# Patient Record
Sex: Male | Born: 1935 | Race: White | Hispanic: No | Marital: Married | Smoking: Former smoker
Health system: Southern US, Community
[De-identification: ages and names within clinical notes are randomized; demographics above are authoritative.]

## PROBLEM LIST (undated history)

## (undated) DIAGNOSIS — D649 Anemia, unspecified: Secondary | ICD-10-CM

## (undated) DIAGNOSIS — H9190 Unspecified hearing loss, unspecified ear: Secondary | ICD-10-CM

## (undated) DIAGNOSIS — Z8719 Personal history of other diseases of the digestive system: Secondary | ICD-10-CM

## (undated) DIAGNOSIS — G629 Polyneuropathy, unspecified: Secondary | ICD-10-CM

## (undated) DIAGNOSIS — K219 Gastro-esophageal reflux disease without esophagitis: Secondary | ICD-10-CM

## (undated) DIAGNOSIS — Z9289 Personal history of other medical treatment: Secondary | ICD-10-CM

## (undated) DIAGNOSIS — Z9981 Dependence on supplemental oxygen: Secondary | ICD-10-CM

## (undated) DIAGNOSIS — N3289 Other specified disorders of bladder: Secondary | ICD-10-CM

## (undated) DIAGNOSIS — I10 Essential (primary) hypertension: Secondary | ICD-10-CM

## (undated) DIAGNOSIS — R413 Other amnesia: Secondary | ICD-10-CM

## (undated) DIAGNOSIS — G2581 Restless legs syndrome: Secondary | ICD-10-CM

## (undated) DIAGNOSIS — C679 Malignant neoplasm of bladder, unspecified: Secondary | ICD-10-CM

## (undated) DIAGNOSIS — I499 Cardiac arrhythmia, unspecified: Secondary | ICD-10-CM

## (undated) DIAGNOSIS — N529 Male erectile dysfunction, unspecified: Secondary | ICD-10-CM

## (undated) DIAGNOSIS — A630 Anogenital (venereal) warts: Secondary | ICD-10-CM

## (undated) DIAGNOSIS — M1990 Unspecified osteoarthritis, unspecified site: Secondary | ICD-10-CM

## (undated) DIAGNOSIS — K921 Melena: Secondary | ICD-10-CM

## (undated) DIAGNOSIS — J189 Pneumonia, unspecified organism: Secondary | ICD-10-CM

## (undated) DIAGNOSIS — F0390 Unspecified dementia without behavioral disturbance: Secondary | ICD-10-CM

## (undated) DIAGNOSIS — I509 Heart failure, unspecified: Secondary | ICD-10-CM

## (undated) DIAGNOSIS — E119 Type 2 diabetes mellitus without complications: Secondary | ICD-10-CM

## (undated) DIAGNOSIS — I4891 Unspecified atrial fibrillation: Secondary | ICD-10-CM

## (undated) HISTORY — DX: Anogenital (venereal) warts: A63.0

## (undated) HISTORY — DX: Melena: K92.1

## (undated) HISTORY — DX: Essential (primary) hypertension: I10

## (undated) HISTORY — DX: Polyneuropathy, unspecified: G62.9

## (undated) HISTORY — DX: Other specified disorders of bladder: N32.89

## (undated) HISTORY — DX: Cardiac arrhythmia, unspecified: I49.9

## (undated) HISTORY — PX: JOINT REPLACEMENT: SHX530

## (undated) HISTORY — DX: Unspecified hearing loss, unspecified ear: H91.90

## (undated) HISTORY — DX: Unspecified osteoarthritis, unspecified site: M19.90

## (undated) HISTORY — DX: Male erectile dysfunction, unspecified: N52.9

## (undated) HISTORY — PX: KNEE ARTHROSCOPY: SHX127

## (undated) HISTORY — DX: Anemia, unspecified: D64.9

## (undated) HISTORY — DX: Gastro-esophageal reflux disease without esophagitis: K21.9

## (undated) HISTORY — DX: Other amnesia: R41.3

## (undated) HISTORY — DX: Restless legs syndrome: G25.81

---
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
## 1997-09-02 ENCOUNTER — Other Ambulatory Visit: Admission: RE | Admit: 1997-09-02 | Discharge: 1997-09-02 | Payer: Self-pay | Admitting: Cardiology

## 1999-07-31 ENCOUNTER — Ambulatory Visit (HOSPITAL_COMMUNITY): Admission: RE | Admit: 1999-07-31 | Discharge: 1999-07-31 | Payer: Self-pay | Admitting: Gastroenterology

## 1999-07-31 ENCOUNTER — Encounter (INDEPENDENT_AMBULATORY_CARE_PROVIDER_SITE_OTHER): Payer: Self-pay | Admitting: Specialist

## 2001-04-17 ENCOUNTER — Inpatient Hospital Stay (HOSPITAL_COMMUNITY): Admission: EM | Admit: 2001-04-17 | Discharge: 2001-04-22 | Payer: Self-pay | Admitting: Emergency Medicine

## 2001-04-17 ENCOUNTER — Encounter: Payer: Self-pay | Admitting: Emergency Medicine

## 2001-08-06 ENCOUNTER — Encounter: Payer: Self-pay | Admitting: Cardiology

## 2001-08-06 ENCOUNTER — Inpatient Hospital Stay (HOSPITAL_COMMUNITY): Admission: EM | Admit: 2001-08-06 | Discharge: 2001-08-08 | Payer: Self-pay | Admitting: Cardiology

## 2003-03-28 HISTORY — PX: CARDIOVASCULAR STRESS TEST: SHX262

## 2003-04-02 ENCOUNTER — Encounter (INDEPENDENT_AMBULATORY_CARE_PROVIDER_SITE_OTHER): Payer: Self-pay | Admitting: Specialist

## 2003-04-02 ENCOUNTER — Observation Stay (HOSPITAL_COMMUNITY): Admission: RE | Admit: 2003-04-02 | Discharge: 2003-04-03 | Payer: Self-pay | Admitting: Urology

## 2003-04-03 ENCOUNTER — Encounter (INDEPENDENT_AMBULATORY_CARE_PROVIDER_SITE_OTHER): Payer: Self-pay | Admitting: Specialist

## 2003-04-22 ENCOUNTER — Encounter (HOSPITAL_BASED_OUTPATIENT_CLINIC_OR_DEPARTMENT_OTHER): Admission: RE | Admit: 2003-04-22 | Discharge: 2003-05-03 | Payer: Self-pay | Admitting: Internal Medicine

## 2003-06-05 ENCOUNTER — Encounter: Admission: RE | Admit: 2003-06-05 | Discharge: 2003-06-05 | Payer: Self-pay | Admitting: Sports Medicine

## 2003-07-24 ENCOUNTER — Encounter (HOSPITAL_BASED_OUTPATIENT_CLINIC_OR_DEPARTMENT_OTHER): Admission: RE | Admit: 2003-07-24 | Discharge: 2003-08-09 | Payer: Self-pay | Admitting: Internal Medicine

## 2003-07-26 ENCOUNTER — Encounter (INDEPENDENT_AMBULATORY_CARE_PROVIDER_SITE_OTHER): Payer: Self-pay | Admitting: Specialist

## 2003-07-26 ENCOUNTER — Ambulatory Visit (HOSPITAL_COMMUNITY): Admission: RE | Admit: 2003-07-26 | Discharge: 2003-07-26 | Payer: Self-pay | Admitting: General Surgery

## 2003-11-06 ENCOUNTER — Encounter (HOSPITAL_BASED_OUTPATIENT_CLINIC_OR_DEPARTMENT_OTHER): Admission: RE | Admit: 2003-11-06 | Discharge: 2003-11-22 | Payer: Self-pay | Admitting: Internal Medicine

## 2004-02-18 ENCOUNTER — Encounter (HOSPITAL_BASED_OUTPATIENT_CLINIC_OR_DEPARTMENT_OTHER): Admission: RE | Admit: 2004-02-18 | Discharge: 2004-03-10 | Payer: Self-pay | Admitting: Internal Medicine

## 2005-08-19 ENCOUNTER — Inpatient Hospital Stay (HOSPITAL_COMMUNITY): Admission: AD | Admit: 2005-08-19 | Discharge: 2005-08-21 | Payer: Self-pay | Admitting: Cardiology

## 2009-02-27 ENCOUNTER — Inpatient Hospital Stay (HOSPITAL_COMMUNITY): Admission: AD | Admit: 2009-02-27 | Discharge: 2009-03-04 | Payer: Self-pay | Admitting: Cardiology

## 2009-07-22 ENCOUNTER — Emergency Department (HOSPITAL_COMMUNITY): Admission: EM | Admit: 2009-07-22 | Discharge: 2009-07-22 | Payer: Self-pay | Admitting: Emergency Medicine

## 2009-08-05 ENCOUNTER — Ambulatory Visit (HOSPITAL_COMMUNITY): Admission: RE | Admit: 2009-08-05 | Discharge: 2009-08-05 | Payer: Self-pay | Admitting: Neurological Surgery

## 2009-08-27 ENCOUNTER — Ambulatory Visit (HOSPITAL_COMMUNITY): Admission: RE | Admit: 2009-08-27 | Discharge: 2009-08-27 | Payer: Self-pay | Admitting: Neurological Surgery

## 2009-10-22 ENCOUNTER — Ambulatory Visit: Payer: Self-pay | Admitting: Cardiology

## 2009-10-27 ENCOUNTER — Encounter: Admission: RE | Admit: 2009-10-27 | Discharge: 2009-10-27 | Payer: Self-pay | Admitting: Gastroenterology

## 2009-12-01 ENCOUNTER — Ambulatory Visit: Payer: Self-pay | Admitting: Cardiology

## 2010-01-13 ENCOUNTER — Ambulatory Visit: Payer: Self-pay | Admitting: Cardiology

## 2010-02-16 ENCOUNTER — Ambulatory Visit: Payer: Self-pay | Admitting: Cardiology

## 2010-03-02 ENCOUNTER — Ambulatory Visit: Payer: Self-pay | Admitting: Cardiology

## 2010-03-12 ENCOUNTER — Ambulatory Visit: Payer: Self-pay | Admitting: Cardiology

## 2010-06-02 ENCOUNTER — Ambulatory Visit (INDEPENDENT_AMBULATORY_CARE_PROVIDER_SITE_OTHER): Payer: Medicare Other | Admitting: Cardiology

## 2010-06-02 DIAGNOSIS — I4891 Unspecified atrial fibrillation: Secondary | ICD-10-CM

## 2010-06-02 DIAGNOSIS — D509 Iron deficiency anemia, unspecified: Secondary | ICD-10-CM

## 2010-06-09 LAB — PROTIME-INR
INR: 1.31 (ref 0.00–1.49)
Prothrombin Time: 16.2 seconds — ABNORMAL HIGH (ref 11.6–15.2)

## 2010-06-09 LAB — DIFFERENTIAL
Eosinophils Absolute: 0.1 10*3/uL (ref 0.0–0.7)
Eosinophils Relative: 2 % (ref 0–5)
Lymphocytes Relative: 21 % (ref 12–46)
Monocytes Absolute: 0.5 10*3/uL (ref 0.1–1.0)
Monocytes Relative: 12 % (ref 3–12)

## 2010-06-09 LAB — BASIC METABOLIC PANEL
Chloride: 103 mEq/L (ref 96–112)
Glucose, Bld: 109 mg/dL — ABNORMAL HIGH (ref 70–99)
Potassium: 4.2 mEq/L (ref 3.5–5.1)

## 2010-06-09 LAB — APTT: aPTT: 35 seconds (ref 24–37)

## 2010-06-09 LAB — CBC
Hemoglobin: 9.4 g/dL — ABNORMAL LOW (ref 13.0–17.0)
Platelets: 211 10*3/uL (ref 150–400)

## 2010-06-11 ENCOUNTER — Other Ambulatory Visit: Payer: Self-pay | Admitting: *Deleted

## 2010-06-11 DIAGNOSIS — G629 Polyneuropathy, unspecified: Secondary | ICD-10-CM

## 2010-06-11 MED ORDER — GABAPENTIN 300 MG PO CAPS: 300.0000 mg | ORAL_CAPSULE | Freq: Every day | ORAL | Status: DC

## 2010-06-11 NOTE — Telephone Encounter (Signed)
Refilled meds per fax request.  

## 2010-06-23 LAB — COMPREHENSIVE METABOLIC PANEL
ALT: 16 U/L (ref 0–53)
Albumin: 3.8 g/dL (ref 3.5–5.2)
Alkaline Phosphatase: 96 U/L (ref 39–117)
BUN: 10 mg/dL (ref 6–23)
CO2: 25 mEq/L (ref 19–32)
Chloride: 102 mEq/L (ref 96–112)
Creatinine, Ser: 1.04 mg/dL (ref 0.4–1.5)
GFR calc Af Amer: >60 mL/min (ref >60)
GFR calc non Af Amer: >60 mL/min (ref >60)
Glucose, Bld: 93 mg/dL (ref 70–99)
Potassium: 4 mEq/L (ref 3.5–5.1)
Total Bilirubin: 0.6 mg/dL (ref 0.3–1.2)
Total Protein: 6.3 g/dL (ref 6.0–8.3)

## 2010-06-23 LAB — BASIC METABOLIC PANEL
BUN: 4 mg/dL — ABNORMAL LOW (ref 6–23)
BUN: 5 mg/dL — ABNORMAL LOW (ref 6–23)
BUN: 7 mg/dL (ref 6–23)
CO2: 26 mEq/L (ref 19–32)
CO2: 28 mEq/L (ref 19–32)
CO2: 29 mEq/L (ref 19–32)
Calcium: 8.2 mg/dL — ABNORMAL LOW (ref 8.4–10.5)
Calcium: 8.3 mg/dL — ABNORMAL LOW (ref 8.4–10.5)
Calcium: 8.7 mg/dL (ref 8.4–10.5)
Chloride: 102 mEq/L (ref 96–112)
Chloride: 102 mEq/L (ref 96–112)
Chloride: 103 mEq/L (ref 96–112)
Creatinine, Ser: 0.94 mg/dL (ref 0.4–1.5)
Creatinine, Ser: 0.95 mg/dL (ref 0.4–1.5)
Creatinine, Ser: 1.07 mg/dL (ref 0.4–1.5)
GFR calc Af Amer: >60 mL/min (ref >60)
GFR calc Af Amer: >60 mL/min (ref >60)
GFR calc Af Amer: >60 mL/min (ref >60)
GFR calc non Af Amer: >60 mL/min (ref >60)
GFR calc non Af Amer: >60 mL/min (ref >60)
Glucose, Bld: 96 mg/dL (ref 70–99)
Potassium: 3.3 mEq/L — ABNORMAL LOW (ref 3.5–5.1)
Potassium: 3.8 mEq/L (ref 3.5–5.1)
Sodium: 137 mEq/L (ref 135–145)
Sodium: 140 mEq/L (ref 135–145)

## 2010-06-23 LAB — CBC
HCT: 20.9 % — ABNORMAL LOW (ref 39.0–52.0)
HCT: 28.5 % — ABNORMAL LOW (ref 39.0–52.0)
HCT: 28.8 % — ABNORMAL LOW (ref 39.0–52.0)
HCT: 29 % — ABNORMAL LOW (ref 39.0–52.0)
Hemoglobin: 5.4 g/dL — CL (ref 13.0–17.0)
Hemoglobin: 6.5 g/dL — CL (ref 13.0–17.0)
Hemoglobin: 9.1 g/dL — ABNORMAL LOW (ref 13.0–17.0)
MCHC: 31.6 g/dL (ref 30.0–36.0)
MCHC: 31.7 g/dL (ref 30.0–36.0)
MCHC: 31.8 g/dL (ref 30.0–36.0)
MCHC: 31.9 g/dL (ref 30.0–36.0)
MCV: 77.6 fL — ABNORMAL LOW (ref 78.0–100.0)
MCV: 77.7 fL — ABNORMAL LOW (ref 78.0–100.0)
Platelets: 146 10*3/uL — ABNORMAL LOW (ref 150–400)
Platelets: 153 10*3/uL (ref 150–400)
RBC: 2.83 MIL/uL — ABNORMAL LOW (ref 4.22–5.81)
RBC: 3.65 MIL/uL — ABNORMAL LOW (ref 4.22–5.81)
RBC: 4.01 MIL/uL — ABNORMAL LOW (ref 4.22–5.81)
RDW: 19.9 % — ABNORMAL HIGH (ref 11.5–15.5)
RDW: 22.1 % — ABNORMAL HIGH (ref 11.5–15.5)
RDW: 22.6 % — ABNORMAL HIGH (ref 11.5–15.5)
RDW: 23.6 % — ABNORMAL HIGH (ref 11.5–15.5)
WBC: 5.2 10*3/uL (ref 4.0–10.5)
WBC: 5.7 10*3/uL (ref 4.0–10.5)
WBC: 6 10*3/uL (ref 4.0–10.5)

## 2010-06-23 LAB — PROTIME-INR
INR: 1.42 (ref 0.00–1.49)
INR: 1.82 — ABNORMAL HIGH (ref 0.00–1.49)
Prothrombin Time: 17.2 seconds — ABNORMAL HIGH (ref 11.6–15.2)

## 2010-06-23 LAB — GLUCOSE, CAPILLARY
Glucose-Capillary: 101 mg/dL — ABNORMAL HIGH (ref 70–99)
Glucose-Capillary: 103 mg/dL — ABNORMAL HIGH (ref 70–99)
Glucose-Capillary: 107 mg/dL — ABNORMAL HIGH (ref 70–99)
Glucose-Capillary: 121 mg/dL — ABNORMAL HIGH (ref 70–99)
Glucose-Capillary: 136 mg/dL — ABNORMAL HIGH (ref 70–99)
Glucose-Capillary: 89 mg/dL (ref 70–99)
Glucose-Capillary: 90 mg/dL (ref 70–99)
Glucose-Capillary: 94 mg/dL (ref 70–99)

## 2010-06-23 LAB — HEMOGLOBIN AND HEMATOCRIT, BLOOD
HCT: 24.9 % — ABNORMAL LOW (ref 39.0–52.0)
HCT: 29.4 % — ABNORMAL LOW (ref 39.0–52.0)
Hemoglobin: 7.9 g/dL — ABNORMAL LOW (ref 13.0–17.0)
Hemoglobin: 9.2 g/dL — ABNORMAL LOW (ref 13.0–17.0)

## 2010-06-23 LAB — HEMOCCULT GUIAC POC 1CARD (OFFICE)
Fecal Occult Bld: NEGATIVE
Fecal Occult Bld: NEGATIVE
Fecal Occult Bld: POSITIVE

## 2010-06-23 LAB — CROSSMATCH: ABO/RH(D): A POS

## 2010-06-23 LAB — PREPARE RBC (CROSSMATCH)

## 2010-06-23 LAB — HEMOGLOBIN A1C: Hgb A1c MFr Bld: 5.6 % (ref 4.6–6.1)

## 2010-06-23 LAB — CARDIAC PANEL(CRET KIN+CKTOT+MB+TROPI): Troponin I: 0.01 ng/mL (ref 0.00–0.06)

## 2010-06-23 LAB — RETICULOCYTES: Retic Count, Absolute: 93.1 10*3/uL (ref 19.0–186.0)

## 2010-06-23 LAB — IRON AND TIBC
Iron: <10 ug/dL — ABNORMAL LOW (ref 42–135)
UIBC: 519 ug/dL

## 2010-06-23 LAB — BRAIN NATRIURETIC PEPTIDE
Pro B Natriuretic peptide (BNP): 157 pg/mL — ABNORMAL HIGH (ref 0.0–100.0)
Pro B Natriuretic peptide (BNP): 168 pg/mL — ABNORMAL HIGH (ref 0.0–100.0)
Pro B Natriuretic peptide (BNP): 180 pg/mL — ABNORMAL HIGH (ref 0.0–100.0)

## 2010-06-23 LAB — VITAMIN B12: Vitamin B-12: 889 pg/mL (ref 211–911)

## 2010-07-08 ENCOUNTER — Other Ambulatory Visit: Payer: Self-pay | Admitting: Cardiology

## 2010-07-08 ENCOUNTER — Other Ambulatory Visit (INDEPENDENT_AMBULATORY_CARE_PROVIDER_SITE_OTHER): Payer: Medicare Other | Admitting: *Deleted

## 2010-07-08 DIAGNOSIS — N3281 Overactive bladder: Secondary | ICD-10-CM

## 2010-07-08 DIAGNOSIS — D509 Iron deficiency anemia, unspecified: Secondary | ICD-10-CM

## 2010-07-08 LAB — CBC WITH DIFFERENTIAL/PLATELET
Basophils Relative: 0.2 % (ref 0.0–3.0)
Eosinophils Absolute: 0.1 10*3/uL (ref 0.0–0.7)
Hemoglobin: 11.4 g/dL — ABNORMAL LOW (ref 13.0–17.0)
Lymphs Abs: 0.8 10*3/uL (ref 0.7–4.0)
MCHC: 34 g/dL (ref 30.0–36.0)
MCV: 90.9 fl (ref 78.0–100.0)
Monocytes Absolute: 0.6 10*3/uL (ref 0.1–1.0)
Neutro Abs: 3.3 10*3/uL (ref 1.4–7.7)
RBC: 3.68 Mil/uL — ABNORMAL LOW (ref 4.22–5.81)

## 2010-07-08 NOTE — Telephone Encounter (Signed)
Received call from patient wife, needs script for oxybutun cl er tabs 15mg .  90 day supply.

## 2010-07-09 ENCOUNTER — Telehealth: Payer: Self-pay | Admitting: *Deleted

## 2010-07-09 MED ORDER — OXYBUTYNIN CHLORIDE ER 15 MG PO TB24: 15.0000 mg | ORAL_TABLET | Freq: Every day | ORAL | Status: DC

## 2010-07-09 NOTE — Telephone Encounter (Signed)
Message copied by Regis Bill on Thu Jul 09, 2010  1:53 PM ------      Message from: [name]      Created: Wed Jul 08, 2010  9:54 PM       Hgb good.  CSD.

## 2010-07-09 NOTE — Telephone Encounter (Signed)
Refill request per patient

## 2010-07-09 NOTE — Telephone Encounter (Signed)
Advised of labs 

## 2010-07-13 ENCOUNTER — Telehealth: Payer: Self-pay | Admitting: *Deleted

## 2010-07-13 NOTE — Telephone Encounter (Signed)
Advised of labs 

## 2010-07-13 NOTE — Telephone Encounter (Signed)
Message copied by Regis Bill on Mon Jul 13, 2010  3:05 PM ------      Message from: [name]      Created: Wed Jul 08, 2010  9:54 PM       Hgb good.  CSD.

## 2010-07-13 NOTE — Progress Notes (Signed)
Advised of labs 

## 2010-07-22 ENCOUNTER — Other Ambulatory Visit: Payer: Self-pay | Admitting: Cardiology

## 2010-07-22 DIAGNOSIS — R413 Other amnesia: Secondary | ICD-10-CM

## 2010-07-22 NOTE — Telephone Encounter (Signed)
escribe request  

## 2010-08-07 NOTE — Op Note (Signed)
NAME:  [name], [name]                           ACCOUNT NO.:  [id number]   MEDICAL RECORD NO.:  [id number]                   PATIENT TYPE:  AMB   LOCATION:  DAY                                  FACILITY:  [facility]   PHYSICIAN:  Ronald L. Ovidio Hanger, M.D.           DATE OF BIRTH:  1928/01/20   DATE OF PROCEDURE:  07/26/2003  DATE OF DISCHARGE:                                 OPERATIVE REPORT   PREOPERATIVE DIAGNOSES:  Bladder tumor status post BCG.   POSTOPERATIVE DIAGNOSES:  Bladder tumor status post BCG.   PROCEDURE:  Cysto, bladder biopsy, barbotage bladder cytology.   SURGEON:  Lucrezia Starch. Earlene Plater, M.D.   ASSISTANT:  Retia Passe, M.D.   ANESTHESIA:  MAC.   ESTIMATED BLOOD LOSS:  Negligible.   TUBES:  None.   COMPLICATIONS:  None.   INDICATIONS FOR PROCEDURE:  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  He understands the risks, benefits, and alternatives and  wishes to proceed accordingly.   DESCRIPTION OF PROCEDURE:  The patient was placed in the supine position and  after proper MAC anesthesia was placed in the dorsal lithotomy position,  prepped and draped with Betadine in a sterile fashion.  Cystourethroscopy  was performed with a 22.5 French Olympus panendoscope utilizing the 12 and  70 degree lenses, the bladder was carefully inspected and there was mild  trilobar hypertrophy. Grade 1 trabeculation was noted, efflux of clear urine  was noted from the normally placed left ureteral orifice. The right trigonal  area had some bullous edema throughout it but the orifice could be seen and  efflux of urine was noted. No frank lesions were noted other than the  bullous edema.  Utilizing cold cut biopsy forceps, the area was biopsied and  cauterized with Bovie coagulation cautery and reinspection revealed no  further lesions.  Barbotage bladder cytologies performed with normal saline  and submitted for cytology. Reinspection revealed good hemostasis was  present. The bladder was drained, the panendoscope was removed and the  patient was taken to the recovery room stable.                                               Ronald L. Ovidio Hanger, M.D.    RLD/MEDQ  D:  07/26/2003  T:  07/26/2003  Job:  [id number]

## 2010-08-07 NOTE — Procedures (Signed)
[place]  Patient:    LAMAJ, METOYER Visit Number: [id number] MRN: [id number]          Service Type: MED Location: 2000 2023 01 Attending Physician:  Rudean Hitt Dictated by:   Everardo All Madilyn Fireman, M.D. Proc. Date: 04/20/01 Admit Date:  04/17/2001   CC:         Thomas A. Patty Sermons, M.D.                           Procedure Report  PROCEDURE PERFORMED:  Esophagogastroduodenoscopy.  ENDOSCOPIST:  Everardo All. Madilyn Fireman, M.D.  INDICATIONS FOR PROCEDURE:  Anemia in a patient with a history of gastrointestinal bleeding of obscure origin in the past.  DESCRIPTION OF PROCEDURE:  The patient was placed in the left lateral decubitus position and placed on the pulse monitor with continuous low flow oxygen delivered by nasal cannula.  He was sedated with 25 mg of IV Demerol and 2 mg IV Versed.  The Olympus video endoscope was advanced under direct vision into the oropharynx and esophagus.  The esophagus was straight and of normal caliber.  The squamocolumnar line sharply identified at 38 cm.  There was no visible esophagitis, ring, stricture or other abnormality of the gastroesophageal junction.  The stomach was entered and a small amount of liquid secretions was suctioned from the fundus.  Retroflex view of the cardia was unremarkable.  The fundus and body appeared.  The antrum showed some mild patches of erythema and granularity with no obvious stigma of hemorrhage.  A CLO test was obtained from adjacent mucosa.  No ulcers were seen and no arteriovenous malformations were appreciated.  The pylorus was nondeformed and easily allowed passage of the endoscope tip into the duodenum.  Both the bulb and second portion were well inspected and appeared to be within normal limits.  I was able to advance the pediatric colonoscope a significant ways down the distal duodenum with good visualization and no AVMs or any other potential bleeding lesions were seen.   The scope was then withdrawn and the patient returned to the recovery room in stable condition.  The patient tolerated the procedure well.  There were no immediate complications.  IMPRESSION:  Mild antral gastritis, otherwise normal endoscopy with limited small bowel enteroscopy.  PLAN:  I performed rectal exam after the procedure and stool was negative. For now since he has had recent lower GI evaluation, will manage expectantly and see if he has further drop in hemoglobin based on GI blood loss and will hold off on any further work-up for now. Dictated by:   Everardo All Madilyn Fireman, M.D. Attending Physician:  Rudean Hitt DD:  04/20/01 TD:  04/20/01 Job: [id number]

## 2010-08-07 NOTE — Procedures (Signed)
[facility]  Patient:    [name], [name]                        MRN: [id number] Proc. Date: 07/31/99 Adm. Date:  [id number] Disc. Date: [id number] Attending:  Louie Bun CC:         Clovis Pu Patty Sermons, M.D.                           Procedure Report  PROCEDURE:  Colonoscopy with polypectomy.  ENDOSCOPIST:  Everardo All. Madilyn Fireman, M.D.  INDICATION FOR PROCEDURE:  History of adenomatous colon polyps in the past.  DESCRIPTION OF PROCEDURE:  The patient was placed in the left lateral decubitus position and placed on the pulse monitor with continuous low-flow oxygen delivered by nasal cannula.  He was sedated with 60 mg IV Demerol and 5 mg IV Versed.  The Olympus videocolonoscope was inserted into the rectum and advanced to the cecum, confirmed by transillumination of McBurneys point and visualization of the ileocecal valve and appendiceal orifice.  Prep was good. The cecum, ascending and transverse colon appears normal, with no masses, polyps, diverticula or other mucosal abnormalities.  Within the descending colon was seen a 1.2 cm sessile polyp which was removed by snare.  Within the sigmoid colon, there were seen several diverticula and a 1.0-cm polyp that was removed by hot biopsy.  There were about three or four diminutive polyps in the rectum that were fulgurated by hot biopsy with no tissue samples obtained. The remainder of the rectum appeared normal.  The scope was then withdrawn and the patient returned to the recovery room in stable condition.  He tolerated the procedure well and there were no immediate complications.  IMPRESSION: 1. Descending and sigmoid colon polyps. 2. Sigmoid diverticulosis. 3. Diminutive rectal polyps.  PLAN:  Await histology for determination of interval for next colonoscopy. DD:  07/31/99 TD:  08/04/99 Job: [id number]

## 2010-08-07 NOTE — Discharge Summary (Signed)
[name], [name]                 ACCOUNT NO.:  [id number]   MEDICAL RECORD NO.:  [id number]          PATIENT TYPE:  INP   LOCATION:  2015                         FACILITY:  [facility]   PHYSICIAN:  [name], M.D. DATE OF BIRTH:  Mar 04, 1928   DATE OF ADMISSION:  08/19/2005  DATE OF DISCHARGE:  08/21/2005                                 DISCHARGE SUMMARY   FINAL DIAGNOSES:  1.  Blood loss anemia.  2.  Dyspnea and weakness secondary to number 1.  3.  Chronic atrial fibrillation.  4.  Adult onset diabetes mellitus.  5.  Exogenous obesity.  6.  Mild memory disorder.  7.  Chronic Coumadin anticoagulation.  8.  Mild congestive heart failure with peripheral edema secondary to stress      of blood loss anemia.   OPERATIONS PERFORMED:  Esophagogastroduodenoscopy, by Dr. Matthias Hughs, on August 20, 2005, which showed no evidence of active bleeding.   HISTORY:  This 77 year old gentleman was admitted from the office on Aug 19, 2005, with weakness and dark stools positive for blood on Hemoccult.  He has  a past history of cryptogenic gastrointestinal bleeds with severe anemia  requiring transfusions in the past.  He had [place] this past week and noted progressive weakness and weight gain with  peripheral edema.  He suspected that he might be having occult GI bleeding  again and went to a local hospital where his hemoglobin was documented to be  down to 8.6.  His Coumadin was in the therapeutic range but he was advised  to stop his Coumadin and to return to [facility] which he did.  In our  office, his INR was noted to be 1.89, hemoglobin was down to 8.1.  He was  noted to have gained 16 pounds since April.  He was admitted for further  evaluation.   PHYSICAL EXAMINATION:  VITAL SIGNS:  Blood pressure is 130/70, pulse was 80  and irregularly irregular.  GENERAL:  He was pale.  He weighed 270 in our office.  EYES:  The sclerae are nonicteric.  CHEST:  Revealed  a few basilar rales.  HEART:  Reveals no S3 gallop.  There is a soft systolic murmur at left  sternal edge.  ABDOMEN:  Nontender.  RECTAL:  Showed dark stool Hemoccult positive.  There is no prostate or  rectal mass felt.  EXTREMITIES:  Showed marked peripheral edema.   HOSPITAL COURSE:  The patient was kept off his Coumadin and he was given IV  vitamin K to reverse his Coumadin.  He was transfused with 2 units of packed  cells on the day of admission with hemoglobin rising from 8.3 up to 9 by the  next morning.  Serum iron studies showed that his iron saturation was only  4%, consistent with chronic iron deficiency.  He was seen in consultation,  on the day of admission, by Dr. Matthias Hughs who felt that the patient should  undergo esophagogastroduodenoscopy.  This was carried out on August 20, 2005,  and no evidence of bleeding was found.  It was Dr. Donavan Burnet feeling that  the patient could be considered for an elective outpatient capsule endoscopy  if he continues to show evidence of GI bleeding.  The patient received a  third unit of packed cells on August 20, 2005, after completion of the  endoscopy procedure and by the morning of discharge hemoglobin was up to  9.6.  The patient was hemodynamically stable at this point and is felt  stable enough to be discharged home to be followed closely as an outpatient.   We will hold his Coumadin for another week and he can resume Coumadin on  June 9, unless there is evidence of further bleeding.   The patient already has an appointment, pre-existing, to see [place] on Monday,  June 4, and he will keep that appointment and we will get a CBC, pro time,  and a B-MET then.   The patient will be on a soft diet and he will limit caffeine.   ACTIVITY:  Can be as tolerated.   DISCHARGE MEDICATIONS:  1.  Lanoxin 0.125 mg 5 days a week skipping Wednesday and Sunday.  He has a      low digoxin level, however, his telemetry has shown occasional long      pauses  and we will, therefore, cut back on his Lanoxin.  Additional      medications include:  2.  Aricept 10 mg daily.  3.  Glucotrol 5 mg daily.  4.  Neurontin 300 mg at bedtime.  5.  Colace 2 daily.  6.  Ditropan XL 5 mg daily.  7.  Lasix 20 mg daily.  8.  Ferrous sulfate 325 mg twice a day.  9.  Protonix 40 mg daily.  10. Restoril 30 mg p.o. h.s. p.r.n.   ACCESSORY LABORATORY STUDIES:  His electrocardiogram showed atrial  fibrillation and otherwise unremarkable.  And, his ventricular response is  slow.  His urinalysis was benign.  Electrolytes were normal.  Digoxin level  0.4.  Absolute retic count is 69.  Vitamin B12 level 447.  Serum iron 17,  TIBC 463, percent saturation 4%, folate greater than 20.  Hemoglobin A1c  5.7.  Antibody for Helicobacter pylori was normal.  B natriuretic peptide  was elevated at 182.   CONDITION ON DISCHARGE:  Is improved.           ______________________________  [name], M.D.     TB/MEDQ  D:  08/21/2005  T:  08/21/2005  Job:  [id number]   cc:   Bernette Redbird, M.D.  Fax: [phone]   Everardo All. Madilyn Fireman, M.D.  Fax: [phone]

## 2010-08-07 NOTE — H&P (Signed)
[name], [name]                 ACCOUNT NO.:  [id number]   MEDICAL RECORD NO.:  [id number]          PATIENT TYPE:  INP   LOCATION:  2015                         FACILITY:  [facility]   PHYSICIAN:  [name], M.D. DATE OF BIRTH:  1928/02/11   DATE OF ADMISSION:  08/19/2005  DATE OF DISCHARGE:                                HISTORY & PHYSICAL   CHIEF COMPLAINT:  Weakness and anemia secondary to gastrointestinal bleed.   HISTORY:  This is a 77 year old married Caucasian gentleman, who is admitted  with weakness and dark stools.  He has a past history of cryptogenic GI  bleeds with severe anemia requiring transfusions.  He has been on chronic  Coumadin for atrial fibrillation.  He was vacationing at his beach house  this past week and became weaker and noted dark stools and, in retrospect,  his stools have been dark for several weeks.  He had no hematemesis, and he  has had no hematochezia.  He called our office several days ago with these  symptoms, and he was advised to have his hemoglobin and prothrombin times  checked [place], which he did.  His hemoglobin  came back 8.6, and his INR was in the therapeutic range.  He was advised to  stop his Coumadin and to return to [place].  He __________ per yesterday  uneventfully and came to the office today where his hemoglobin had dropped  down to 8.1, and his INR was noted today to be 1.89.  In addition to his  weakness and anemia, he has noted increasing pretibial and pedal edema, and  his weight has gone from 254 in June 21, 2005 to 270 today, a gain of 16  pounds, and he has had marked pedal edema.  He has not had any chest pain.  He has had exertional dyspnea.   The patient has a past history of having developed a GI bleed while  vacationing in [place] in 1996, and, at that time, his hemoglobin dropped  to 6.9, and he had a workup there including colonoscopy and endoscopy and no  source of bleeding  was found.  He subsequently has had periodic  colonoscopies and upper endoscopies by Dr. Dorena Cookey, the last one being  several years ago.   PAST MEDICAL HISTORY:  1.  Exogenous obesity.  2.  Mild adult onset diabetes.  3.  Chronic atrial fibrillation.  4.  Prior history of colon polyps.  5.  He is status post left knee replacement in 1994.  6.  A remote history of an appendectomy and a tonsillectomy.   FAMILY HISTORY:  Father had a type of bone cancer and died at age 75.  Family history is negative for premature coronary artery disease.   ALLERGIES:  The patient has a history of allergies to ZESTRIL which causes a  cough, MACRODANTIN and KEFLEX.   CURRENT MEDICATIONS:  1.  Coumadin 5 mg taking 1 tablet 5 days a week and 2 tablets 2 days a week,      and he has had none for  the last several days.  2.  Lanoxin 0.125 mg daily.  3.  Aricept 10 mg daily.  4.  Multivitamin daily.  5.  Calcium 1000 mg daily.  6.  Prilosec OTC daily.  7.  Cod liver oil daily.  8.  Surfak 1 daily.  9.  Nitrostat __________ p.r.n.  10. Ditropan XL 5 mg daily.  11. Lasix 20 mg p.r.n.  12. Cialis 20 mg p.r.n.  13. Allegra 180 mg p.r.n.  14. Restoril 30 mg h.s. p.r.n.  15. Glucotrol 5 mg daily.  16. Generic Neurontin 300 mg at h.s. for symptoms of possible diabetic      neuropathy.   SOCIAL HISTORY:  He is married.  He does not use tobacco products.  He is a  retired [employer].   REVIEW OF SYSTEMS:  He is not experiencing any recent cardiac symptoms.  He  has had no recent angina.  He is not having any orthopnea or paroxysmal  nocturnal dyspnea but has had recent peripheral edema as noted.  He has had  no abdominal pain, nausea, or vomiting.  He has not had any new  genitourinary symptoms, and does have a history of urinary frequency and is  on Ditropan.   Remainder of review of systems is negative in detail.   PHYSICAL EXAMINATION:  VITAL SIGNS:  Blood pressure is 130/70.  The pulse is  80  and irregularly irregular.  Color is pale.  This is a large gentleman  weighing 270 pounds on our office scale.  HEENT:  The pupils are equal and reactive.  Sclerae nonicteric.  Mouth and  pharynx normal.  Carotids normal.  Thyroid normal.  Venous pressure normal.  CHEST:  Reveals a few basilar rales.  HEART:  Reveals no S3 gallop, no murmur, click, or rub.  ABDOMEN:  Obese, nontender, no mass.  RECTAL EXAM:  Dark stool, Hemoccult positive.  There is no prostate or  rectal mass.  EXTREMITIES:  Marked peripheral edema.   IMPRESSION:  1.  A recurrent gastrointestinal bleed of uncertain source.  2.  Anemia secondary to #1.  3.  Chronic atrial fibrillation.  4.  History of chronic Coumadin anticoagulation.  5.  Recent onset of edema, probably secondary to anemia and mild congestive      heart failure.  6.  Mild diabetes with possible diabetic neuropathy  7.  Osteoarthritis status post left total knee replacement.   DISPOSITION:  The patient is being admitted to telemetry.  We will plan to  transfuse with at least 2 units of packed cells.  We will give IV Lasix.  We  will reverse his Coumadin.  We will obtain a GI consult with Dr. Molly Maduro  Buccini on call for Dr. Dorena Cookey.           ______________________________  [name], M.D.     TB/MEDQ  D:  08/19/2005  T:  08/19/2005  Job:  [id number]   cc:   Bernette Redbird, M.D.  Fax: [phone]

## 2010-08-07 NOTE — H&P (Signed)
NAME:  [name], HEAL NO.:  [id number]   MEDICAL RECORD NO.:  [id number]                   PATIENT TYPE:   LOCATION:                                       FACILITY:   PHYSICIAN:  Ethelene Browns., M.D.            DATE OF BIRTH:  [birth year]   DATE OF ADMISSION:  07/26/2003  DATE OF DISCHARGE:                                HISTORY & PHYSICAL   This 77 year old male is scheduled for cystoscopy and bladder biopsy on Jul 26, 2003, at [facility] at 9 a.m. with a history of bladder tumors, status  post PCG x 6 completed on June 18, 2003.   HISTORY OF PRESENT ILLNESS:  This 77 year old male has a history of  prostatitis and BPH dating to 71.  He developed some dysuria and urinary  tract infections, which was treated successfully with antibiotics, but  continued to have blood cells in the urine.  For that reason, he was  investigated.  In December of 2004, he had a nodular tumor in the right  trigone.  This was resected by Dr. Earlene Plater and did show a high-grade  noninvasive tumor.  Postoperatively the patient was treated with six BCG  treatments and is now scheduled for a followup cystoscopy and biopsy of the  bladder.  The patient reports that he voids every hour, has a good deal of  urgency, gets up one or twice a night, and has a little dribbling.  He has  not passed gross blood, gravel, or stone, but did have a few clots a few  days following his BCG treatments.   ALLERGIES:  1. MACRODANTIN.  2. FURACIN.   MEDICATIONS:  He is on Detrol LA 4 mg, Mobic, Tylenol, guaifenesin, Aricept,  Lanoxin, Tolinase, Citracal, and cod liver oil.   PAST MEDICAL HISTORY:  1. The patient has had prostatitis and BPH.  2. Diabetes with neuropathy.  3. Obesity.  4. Hypertension.  5. Cardiac arrhythmia.  6. Rectal fissures.   PAST SURGICAL HISTORY:  The patient has had surgery for rectal fissures and  hemorrhoids and had TUR of a bladder tumor on April 02, 2003.   FAMILY HISTORY:  Positive for diabetes and hypertension.   SOCIAL HISTORY:  He is married.  He does not abuse tobacco.  He uses alcohol  socially.   REVIEW OF SYSTEMS:  He has had obesity for many years.  HEENT:  Unremarkable.  CARDIORESPIRATORY:  He does have shortness of breath on  exertion and some cardiac arrhythmias.  He has been on Coumadin in the past.  He has had a recent Cardiolite test by Dr. Patty Sermons.  GASTROINTESTINAL:  No  hepatitis or peptic ulcer disease.  BONES, JOINTS, AND MUSCLES:  Arthritis  with pains in the right arm and shoulder.  NEUROPSYCHIATRIC:  Denies any  fainting, falling out spells, or stroke.  HEMATOPOIETIC:  No nodes or lumps  noted.  SKIN:  No rashes.   PHYSICAL EXAMINATION:  GENERAL APPEARANCE:  A 77 year old male, moderately  obese.  VITAL SIGNS:  Temperature 97.0 degrees, pulse 78, respirations 24, blood  pressure 142/92.  HEENT:  The ears and tympanic membranes are unremarkable.  Eyes react  normally to light and accommodation.  Extraocular movements intact.  Pharynx  benign.  Teeth in good repair.  NECK:  No enlargement of thyroid.  No nodes are palpable.  CHEST:  Clear to auscultation and percussion.  HEART:  Normal sinus rhythm with no murmur detected.  ABDOMEN:  Markedly obese.  Liver, spleen, kidneys, masses, tenderness, and  hernia are not detected.  There is a right lower quadrant scar.  GENITALIA:  The meatus is normal.  The penis is uncircumcised.  The testes  are good size and symmetrical.  Scrotum, anus, and perineum normal.  RECTAL:  Exam reveals poor tone.  The prostate is 25 g, soft, slightly  nodule, symmetrical, and nontender.  EXTREMITIES:  Good peripheral pulses with a trace of edema on the ankles and  feet.  He does have some stasis dermatitis.  NEUROLOGIC:  Seems to be intact grossly for sensation and motor function.  SKIN:  No lesions noted.  LYMPHATICS:  No nodes noted.   IMPRESSION:  1. Transitional cell  carcinoma of the trigone, high grade, right, with right     iliac adenopathy on CT scan.  Status post BCG x 6 completed on June 18, 2003.  2. Prostatitis, onset in 1987.  3. Benign prostatic hypertrophy.  Last PSA 0.79 in July of 2004.  4. Diabetes with neuropathy.  5. Obesity.  6. Hypertension.  7. Cardiac arrhythmia on Coumadin.  8. Rectal fissures.   PLAN:  Cystoscopy and bladder biopsy in conjunction with Dr. Earlene Plater on Jul 26, 2003, at [facility].                                               Ethelene Browns., M.D.    HB/MEDQ  D:  07/25/2003  T:  07/25/2003  Job:  [id number]   cc:   Windy Fast L. Ovidio Hanger, M.D.  [address]  [place]  [place] [address]  Fax: [phone]   Ethelene Browns., M.D.  [address]  [place]  [place] [address]  Fax: [phone]

## 2010-08-07 NOTE — H&P (Signed)
[place]. [facility]  Patient:    [name], [name] Visit Number: [id number] MRN: [id number]          Service Type: MED Location: 1800 1825 01 Attending Physician:  Doug Sou Dictated by:   Clovis Pu. Patty Sermons, M.D. Admit Date:  04/17/2001   CC:         Radene Knee., M.D.   History and Physical  CHIEF COMPLAINT:  Chest pain, dyspnea, and anemia.  HISTORY OF PRESENT ILLNESS:  This is a 77 year old married Caucasian gentleman who is admitted after being seen in the office earlier today with precordial chest pain.  The patient has a history of chronic atrial fibrillation and has been on long-term Coumadin.  He has not been aware of any recent change in bowel habits, hematochezia, or melena.  Last evening he noted chest pressure and had increased dyspnea, particularly when he would try to lie on his side. He has been feeling weak and run down for the past month.  He has had a three-day history of increasing weight and ankle edema.  He was seen in the office today where he was examined, and blood work was drawn in the office which, after the patient left the office, came back with a hemoglobin of 7.0, hematocrit 25.6, MCV 65.  His electrocardiogram in the office was nonacute.  Of note is the fact that the patient has a past history of having been hospitalized in [place] in about 1998 or so with unexplained anemia.  He had upper and lower endoscopy in [place], and no source of bleeding was found.  The patient was seen by Dr. Dorena Cookey Jul 31, 1999, and had a colonoscopy with polypectomy at that time.  He was found to have diminutive rectal polyps and to have sigmoid diverticulosis.  The patient, as noted, has been on long-term Coumadin anticoagulation.  His prothrombin time on admission today is therapeutic with an INR of 2.6.  The patient does not have any past history of known coronary artery disease. He has had a long history of atrial  fibrillation, and an echocardiogram in 1993 showed mild tricuspid regurgitation, slight aortic root dilatation, left atrial enlargement, and LVH with good LV function.  The patient has been in atrial flutter at least since 1979.  PRESENT MEDICATIONS:  1. Coumadin 5 mg, 2 tablets 2 days a week, 1 tablet 5 days a week.  2. Tolinase 100 mg daily.  3. Lanoxin 0.125 mg daily.  4. Aricept 5 mg daily.  5. Multivitamins once a day.  6. Vitamin E once a day.  7. Calcium 1000 mg daily.  8. Prevacid 30 mg p.r.n. but has not taken any recently.  9. Cod Liver Oil daily. 10. Surfak daily. 11. Nitrostat 1/150 p.r.n. 12. Ditropan XL 10 one daily. 13. Recently has been taking Lasix 20 mg 1 or 2 daily for fluid retention.  He recently was placed on an antibiotic and on what sounds like Pyridium by Dr. Elige Radon for a urinary infection.  ALLERGIES:  The patient is allergic to [facility], KEFLEX, and ZESTRIL in the past has caused a cough.  FAMILY HISTORY:  His father died at age 80 of prostate cancer with bone metastasis.  His mother died at age 72 of kidney failure.  SOCIAL HISTORY:  He is married and has four children.  He is a retired Char[employer] fundraiser.  He drinks occasional alcohol but none recently.  He had quit smoking after smoking cigarettes about 25 years.  PAST SURGICAL HISTORY:  Left total knee by Dr. [name].  He has also had appendectomy and a pilonidal cyst.  REVIEW OF SYSTEMS:  Gastrointestinal: Remote history of GI bleed around five years ago in [place], never found the source.  Genitourinary: Recent bladder symptoms treated by Dr. Elige Radon.  Respiratory: No recent cough, sputum production, or hemoptysis.  Cardiovascular: No history of known coronary disease or myocardial infarction.  Recent cholesterol done in the office October 19, 2000, was 142, LDL 88, HDL 28, and triglycerides 540.  His last PSA was done on October 19, 2000, and was normal at 1.16.  The remainder of the Review of  Systems is negative in detail.  PHYSICAL EXAMINATION:  VITAL SIGNS:  His weight is 312, up 8 pounds since last visit.  Blood pressure 130/70, pulse 60 and irregularly irregular.  Respiratory rate is normal.  GENERAL:  A large gentleman who appears pale.  Skin is warm and dry.  He is complaining of mild chest discomfort.  The discomfort is a pressure-like sensation.  There was slow response to a total of 3 nitroglycerin prior to coming to the office.  There was no associated nausea or sweating.  The patient is alert and cooperative.  HEAD AND NECK:  Pupils equal and reactive.  Extraocular movements are full. Fundi normal.  Ears normal.  Mouth and pharynx normal.  Tongue normal. Jugular venous pressure normal.  Carotids normal without bruits.  Thyroid not enlarged or tender.  No lymphadenopathy.  CHEST:  Clear.  HEART:  Quiet precordium without murmur, gallop, rub, or click.  ABDOMEN:  Soft without hepatosplenomegaly or masses.  RECTAL:  Deferred.  GENITALIA:  Deferred.  EXTREMITIES:  Reveal 1+ pretibial edema and ankle edema.  Pedal pulses are present.  There is no phlebitis or edema.  NEUROLOGIC:  Physiologic.  PSYCHIATRIC:  Normal.  IMPRESSION: 1. Severe microcytic anemia suggesting chronic gastrointestinal blood loss. 2. Chest pain, rule out myocardial infarction, rule out crescendo angina. 3. Chronic atrial fibrillation. 4. Chronic Coumadin anticoagulation with INR 2.6. 5. Exogenous obesity. 6. Diabetes mellitus. 7. Benign prostatic hypertrophy by history.  LABORATORY DATA ON ADMISSION: Hemoglobin 6.8, hematocrit 22.9.  Sodium 137, potassium 3.9, BUN 11, creatinine 0.8, blood sugar 129.  CK 285 with an MB of 3.9, troponin 0.01.  Digoxin level 0.2.  DISPOSITION:  We are admitting him to telemetry.  We will get serial enzymes and EKGs to rule out an MI.  We will transfuse after drawing appropriate baseline labs.  Consider GI consult with Dr. Dorena Cookey group.  His  last  colonoscopy was Jul 31, 1999, by Dr. Dorena Cookey. Dictated by:   Clovis Pu Patty Sermons, M.D. Attending Physician:  Doug Sou DD:  04/17/01 TD:  04/17/01 Job: [id number]

## 2010-08-07 NOTE — Discharge Summary (Signed)
[place]. [facility]  Patient:    SEENA, FACE Visit Number: [id number] MRN: [id number]          Service Type: MED Location: 3700 3734 01 Attending Physician:  Rudean Hitt Dictated by:   Clovis Pu Patty Sermons, M.D. Admit Date:  08/06/2001 Discharge Date: 08/08/2001                             Discharge Summary  FINAL DIAGNOSES: 1. Chest pain. 2. Coronary artery disease. 3. Chronic obstructive pulmonary disease. 4. Diabetes mellitus, type 2. 5. Exogenous obesity. 6. Chronic Coumadin anticoagulation.  OPERATIONS PERFORMED:  Left heart cardiac catheterization on Aug 07, 2001, by Alvia Grove., M.D.  HISTORY OF PRESENT ILLNESS:  This 77 year old married gentleman was admitted with chest pain.  He has no known coronary disease.  Two nights prior to admission he had had substernal chest pressure.  He took a nitroglycerin with relief of pain.  On the evening of admission, he had recurrent chest pain while watching television at about 11 p.m.  It began as an aching discomfort deep in his right arm and then spread across his chest and was described as a tight feeling.  It was not sharp and there was no diaphoresis, nausea, or vomiting, but he was dyspneic.  A friend drove him to the emergency room where his initial electrocardiogram was nonacute.  He does have a family history kidney failure and prostate cancer, but no premature coronary disease.  He is a former smoker, having quit 35 years earlier.  He drinks one glass of wine per night.  He is a retired Char[employer] fundraiser.  PHYSICAL EXAMINATION:  The vital signs on admission were normal, except for an irregular pulse from his atrial fibrillation.  LUNGS:  No rales.  HEART:  No murmur, gallop, rub, or click.  ABDOMEN:  Soft, obese, and nontender.  No masses.  EXTREMITIES:  Trace edema.  WEIGHT:  Approximately 300 pounds.  LABORATORY DATA:  His electrocardiogram showed atrial  fibrillation with a controlled ventricular response, but no acute ST-T wave changes.  His chest x-ray was pending.  HOSPITAL COURSE:  We held his Coumadin in anticipation of probable cardiac catheterization.  His initial set of enzymes were negative.  He did have additional chest discomfort after discomfort relieved by increasing his IV nitroglycerin drip.  His admission INR was subtherapeutic, so Lovenox was started while he was off Coumadin until after the catheterization.  Beta blockers were added for angina and heart rate control.  On Aug 07, 2001, the patient underwent cardiac catheterization which showed only minor coronary irregularities and normal LV function.  Coumadin was restarted.  By the following day, he was stable and the groin remained stable.  He received Lovenox overnight and was able to be discharged the following morning with a stable groin, no further chest pain, and pro time up to 14.4.  The hemoglobin remained stable at 11.4 at discharge.  DISCHARGE MEDICATIONS: 1. Coumadin 5 mg one-half tablet daily. 2. Lanoxin 0.125 mg daily. 3. Ditropan XL 10 mg one daily. 4. Prevacid 30 mg daily. 5. Lopressor 50 mg half of a tablet twice a day. 6. Multivitamins one daily in the form of Centrum Silver. 7. Humibid LA 600 mg twice a day as needed for cough. 8. Aricept 5 mg daily. 9. Nitrostat 1/150 mg sublingually p.r.n.  ACTIVITY:  The patient is to walk as tolerated.  He is to  avoid much bending of the right leg for the next several days.  DIET:  He will continue on a low-cholesterol, weight loss, diabetic diet. FOLLOW-UP:  He is to see Maisie Fus A. Brackbill, M.D., in two weeks for office visit and pro time.  He will call for an appointment.  CONDITION ON DISCHARGE:  Improved. Dictated by:   Clovis Pu Patty Sermons, M.D. Attending Physician:  Rudean Hitt DD:  08/25/01 TD:  08/28/01 Job: [id number]

## 2010-08-07 NOTE — Op Note (Signed)
NAMEBRAYDIN, ALOI                 ACCOUNT NO.:  [id number]   MEDICAL RECORD NO.:  [id number]          PATIENT TYPE:  INP   LOCATION:  2015                         FACILITY:  [facility]   PHYSICIAN:  Bernette Redbird, M.D.   DATE OF BIRTH:  [birth year]   DATE OF PROCEDURE:  08/20/2005  DATE OF DISCHARGE:                                 OPERATIVE REPORT   PROCEDURE:  Upper endoscopy.   ENDOSCOPIST:  Bernette Redbird, M.D.   INDICATIONS:  [age]-year-old gentleman known to Dr. Dorena Cookey with a  history of recurrent obscure GI bleeding, on chronic Coumadin for atrial  fibrillation, [place] an [place] with  melenic stool and a roughly 4-g drop in hemoglobin from baseline.  Dr.  Yevonne Pax rectal exam confirmed dark heme-positive stool.  His Coumadin  has been held; his INR is normal.  He is not on any ulcerogenic medications.   FINDINGS:  Normal exam.   PROCEDURE:  The nature, purpose and risks of the procedure had been reviewed  with the patient, who provided written consent.  Sedation was fentanyl 50  mcg and Versed 3.5 mg IV, without arrhythmias or desaturation.  The Olympus  video endoscope was passed under direct vision.  The larynx and vocal cords  looked normal.  The esophagus was readily entered and was normal in its  entirety without evidence of reflux esophagitis, Barrett's esophagus,  varices, infection, neoplasia or any hiatal hernia.  There appeared to be a  widely patent esophageal ring.   The stomach contained no blood, coffee-grounds or other residual and the  gastric mucosa was essentially normal with just a little bit of patchy  antral erythema, not felt to be clinically significant.  No erosive changes  were observed and no ulcers, polyps or masses were seen anywhere in the  stomach including a retroflexed view of the cardia.  The pylorus, duodenal  bulb and second duodenum looked normal.  The duodenal bulb was carefully  inspected  with no additional findings.  The scope was then removed from the  patient, who tolerated the procedure well and there no apparent  complications.  No biopsies were obtained.   IMPRESSION:  Normal endoscopy in a patient with melenic stool.  The  differential diagnosis would include small bowel vascular ectasia (previous  small bowel endoscopy by Dr. Madilyn Fireman was unrevealing), a Dieulafoy-type  lesion, or an evanescent upper tract lesion.   PLAN:  Consider possible capsule endoscopy.           ______________________________  Bernette Redbird, M.D.     RB/MEDQ  D:  08/20/2005  T:  08/20/2005  Job:  [id number]

## 2010-08-07 NOTE — Cardiovascular Report (Signed)
[place]. [facility]  Patient:    [name], [name] Visit Number: [id number] MRN: [id number]          Service Type: MED Location: 3700 3734 01 Attending Physician:  Rudean Hitt Dictated by:   Alvia Grove., M.D. Proc. Date: 08/07/01 Admit Date:  08/06/2001 Discharge Date: 08/08/2001   CC:         Thomas A. Patty Sermons, M.D.  Cardiac Catheterization Laboratory   Cardiac Catheterization  INDICATIONS: The patient is a 77 year old gentleman with a history of chest pain. He also has a history of atrial fibrillation. He was admitted to the hospital with symptoms consistent with unstable angina. He is referred for heart catheterization because of these episodes of chest pain.  PROCEDURES: Left heart catheterization with coronary angiography. The right femoral artery was easily cannulated using a modified Seldinger technique.  HEMODYNAMICS: The LV pressure was 123/11 with an aortic pressure of 123/66.  ANGIOGRAPHY: The left main coronary artery is relatively smooth and normal.  The left anterior descending artery is a moderate sized vessel. There are minor luminal irregularities. There is a large diagonal vessel that is normal and several small diagonal vessels that are normal.  The left circumflex artery is a large and dominant vessel. It gives off two obtuse marginal branches which are fairly normal. There are minor luminal irregularities in the origin of these marginal branches.  The right coronary artery is relatively small and nondominant. It does not supply any significant amount of the left ventricular myocardium. There are minor luminal irregularities.  LEFT VENTRICULOGRAM: The left ventriculogram was performed in a 30 RAO position. It reveals overall normal left ventricular systolic function with an ejection fraction around 60-65%. There was some catheter-induced ectopy which made it difficult to accurately assess the LV  function. There did not appear to be any mitral regurgitation.  COMPLICATIONS: None.  CONCLUSIONS: 1. Relatively normal coronary arteries. He does have some minor luminal    irregularities. 2. Normal left ventricular systolic function. Dictated by:   Alvia Grove., M.D. Attending Physician:  Rudean Hitt DD:  08/07/01 TD:  08/08/01 Job: [id number]

## 2010-08-07 NOTE — H&P (Signed)
[place]. [facility]  Patient:    [name], [name] Visit Number: [id number] MRN: [id number]          Service Type: MED Location: 3700 3734 01 Attending Physician:  Rudean Hitt Dictated by:   Clovis Pu Patty Sermons, M.D. Admit Date:  08/06/2001 Discharge Date: 08/08/2001                           History and Physical  CHIEF COMPLAINT:  Chest pain.  HISTORY:  This is a 77 year old married Caucasian gentleman admitted with chest pain.  He has no known coronary artery disease.  Two nights ago the patient had substernal chest pressure and took a nitroglycerin with relief of pain.  Tonight he had recurrent chest pressure while watching television at about 11 p.m.  It began as an aching discomfort deep in his right arm and then spread across his chest and it was a tight feeling.  It was not sharp.  There was no diaphoresis or nausea or vomiting, but he was dyspneic.  He had a friend drive him to the emergency room where his initial electrocardiogram is nonacute.  FAMILY HISTORY:  Reveals that his mother died of kidney failure at 17, father died of prostate cancer at 59.  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  He quit smoking about 35 or 40 years ago.  He drinks one glass of wine a night.  He is married and has four children.  REVIEW OF SYSTEMS:  GI: Past history of GI hemorrhage, uncertain source, while vacationing in [place].  He had upper and lower endoscopy and never found the source.  Genitourinary reveals a history of urinary frequency and he is on Ditropan.  Mental status has shown some decrease in memory and he has recently had a trial of Aricept.  He has had a history of diabetes which has been mild, and he has been in chronic atrial fibrillation on Coumadin.  Remainder of review of systems negative in detail.  ALLERGIES:  He is allergic to FURACIN.  PHYSICAL EXAMINATION:  VITAL SIGNS:  Blood pressure 120/82,  pulse 92 and irregularly irregular, respirations are normal.  HEENT:  Negative.  CHEST:  Clear.  HEART:  Reveals no murmur, gallop, rub or click.  ABDOMEN:  Soft, obese, nontender, no mass, no rebound.  EXTREMITIES:  Reveal trace edema, no phlebitis.  NEUROLOGIC EXAM:  Physiologic.  INTEGUMENT:  Normal.  DIAGNOSTIC STUDIES:  His electrocardiogram shows atrial fibrillation with a controlled ventricular response.  Chest x-ray is pending.  Labs are pending.  DIAGNOSTIC IMPRESSION: 1. Chest pain, rule out myocardial infarction. 2. Diabetes mellitus. 3. Chronic atrial fibrillation, on Coumadin. 4. Exogenous obesity. 5. Remote history of gastrointestinal bleed.  DISPOSITION:  We are going to hold Coumadin in anticipation of probable cardiac catheterization.  We will get serial enzymes and EKGs.  We will treat with IV nitroglycerin.  Will also consider the addition of beta-blocker if his heart rate allows. Dictated by:   Clovis Pu Patty Sermons, M.D. Attending Physician:  Rudean Hitt DD:  08/06/01 TD:  08/06/01 Job: [id number]

## 2010-08-07 NOTE — Discharge Summary (Signed)
[place]. [facility]  Patient:    [name], [name] Visit Number: [id number] MRN: [id number]          Service Type: MED Location: 2000 2023 01 Attending Physician:  Rudean Hitt Dictated by:   Clovis Pu Patty Sermons, M.D. Admit Date:  04/17/2001 Discharge Date: 04/22/2001   CC:         John C. Madilyn Fireman, M.D.   Discharge Summary  FINAL DIAGNOSES: 1. Anemia. 2. Iron-deficiency anemia. 3. Atrial fibrillation, chronic. 4. Exogenous obesity. 5. Diabetes mellitus, non-insulin-dependent. 6. Sinoatrial node dysfunction. 7. Gastritis without active hemorrhage.  OPERATION PERFORMED:  Upper endoscopy with closed biopsy on 04/20/01 by Dr. Dorena Cookey.  HISTORY:  This is a 77 year old gentleman admitted because of precordial chest pain.  He has a past history of atrial fibrillation and has been on long-term Coumadin.  He has had no recent awareness of change in bowel habits, melena, etc.  He has a remote history of GI bleeds while vacationing in [place] several years ago.  He has had recent 2-3 day increase in weight as well as worsening ankle edema.  He came to the office on the day of admission where his hemoglobin was 7.0 and hematocrit 25.6.  MCV was 65 consistent with iron-deficiency anemia and his electrocardiogram shows atrial fibrillation but is otherwise nonacute.  The patient was admitted for further evaluation of the anemia and the chest pain.  HOSPITAL COURSE:  The patient had no further chest pain.  He was given oxygen. Rhythm on telemetry showed stable atrial fibrillation on low dose Lanoxin. The patient had no bowel movements over the course of the first 24 hours and he reported his stool color had been normal at home.  The red blood cell morphology was consistent with iron-deficiency anemia suggesting a probable slow leak from the GI tract.  Dr. Dorena Cookey was asked to see the patient in consultation.  Dr. Madilyn Fireman had done a colonoscopy on  the patient several years previous.  The patient was given two units of packed cells on the afternoon of admission and tolerated it well, and after the packed cells felt considerably improved in terms of overall strength and dyspnea.  Dr. Madilyn Fireman felt that the patient probably had recurrent GI bleeding and he suspected upper GI or small bowel of source and recommended an esophagogastroduodenoscopy with a pediatric colonoscope.  The patient was on Coumadin at the time of admission and vitamin K was given to reverse his Coumadin effect to allow for the endoscopy. After four units of packed cells, the hemoglobin had risen to 8.8, hematocrit 28.6.  He was begun on oral iron.  Hemoglobin A1c was 5.9, consistent with good diabetic control on an outpatient basis.  Vital signs remained stable with slow response to atrial fibrillation at 60 per minute.  Edema cleared in the hospital.  The patients telemetry showed occasional long pauses of greater than two seconds and Lanoxin was held.  The patient underwent upper endoscopy on 04/20/01 which showed only mild gastritis, otherwise normal including the proximal small bowel.  A rectal exam was done and the stool tested guaiac negative.  Dr. Madilyn Fireman felt that we could hold off on further diagnostic studies for now since he had had a fairly recent colonoscopy.  The patient remained stable overnight.  Hemoglobin was 8.7 on 1/31 and was able to be discharged home improved on 05/03/01 to be followed closely in the office.  DISCHARGE MEDICATIONS: 1. Aricept 5 mg at h.s. 2. Prevacid  30 mg daily. 3. Lasix 40 mg daily. 4. Tolinase 100 mg daily. 5. Lanoxin 0.125 mg daily. 6. Vitamins as before. 7. Surfak daily. 8. Ditropan XL 10 mg daily. 9. Nitrostat sublingually p.r.n.  SPECIAL INSTRUCTIONS:  He is to leave off Coumadin and he is to take iron tablets twice a day.  He will continue on a diabetic diet.  He is to see Dr. Patty Sermons in less than one  week.  CONDITION ON DISCHARGE:  Improved. Dictated by:   Clovis Pu Patty Sermons, M.D. Attending Physician:  Rudean Hitt DD:  05/04/01 TD:  05/04/01 Job: [id number]

## 2010-08-07 NOTE — H&P (Signed)
NAME:  [name], [name]                           ACCOUNT NO.:  [id number]   MEDICAL RECORD NO.:  [id number]                   PATIENT TYPE:  AMB   LOCATION:  DAY                                  FACILITY:  [facility]   PHYSICIAN:  Ethelene Browns., M.D.            DATE OF BIRTH:  1927-08-11   DATE OF ADMISSION:  DATE OF DISCHARGE:                                HISTORY & PHYSICAL   HOSPITAL COURSE:  This 77 year old male was scheduled for April 02, 2003,  for TUR of bladder tumor with a chief complaint of passing blood in the  urine.   HISTORY OF PRESENT ILLNESS:  This 77 year old male has a history of  prostatitis and benign prostatic hypertrophy dating back to at least 70.  During the last 2 to 3 months he noted some dysuria, some discomfort in his  left side and some bloody urine for 3 to 4 days.  Initially he was felt to  have some urinary tract infection treated with antibiotics, but continued to  have blood cells in the urine. He reported that he was getting up once or  twice a nice and voiding every 1 to 2 hours during the day and had some  terminal dribbling. On at least one occasion he passed some what appeared  to be tissue  but no gravel or stones was noted to pass. The flow was a  little slow.   He had a cystoscopy in the office on March 13, 2003, that suggested a  possible nodular tumor right trigone. He had a CT scan performed which  suggested right iliac adenopathy, but the kidneys showed no evidence of  obstruction  and no evidence of stone or mass. For that reason the patient  was recommended for TUR of bladder tumor under anesthesia and this has been  cleared by his medical doctor, Dr. Patty Sermons, with a Cardiolite test which  showed no evidence of coronary artery disease.   ALLERGIES:  MACRODANTIN AND FLUROSYN.   MEDICATIONS:  Tylenol, guaifenesin, Aricept, Lanoxin, Tolinase, Coumadin,  Citracal, cod liver oil.   PAST MEDICAL HISTORY:  In addition to his  prostatitis and BPH, he has had  diabetes with neuropathy, obesity, hypertension and a cardiac  arrhythmia as  well as rectal fissures.   PAST SURGICAL HISTORY:  Reveals that he has had surgery for his rectal  fissures and hemorrhoids.   SOCIAL HISTORY:  He is married. He does not abuse tobacco. He uses alcohol  socially.   FAMILY HISTORY:  Positive for diabetes, hypertension.   REVIEW OF SYSTEMS:  He has been obese for many years. HEENT:  Unremarkable.  CARDIORESPIRATORY:  He has cardiac  irregularity for which he is on  Coumadin. He denies any chest pain and had a normal Cardiolite test by Dr.  Patty Sermons. GI:  No hepatitis or peptic ulcer disease. BONES, JOINTS,  MUSCLES:  He does have arthritis and some  pains in his right arm and  shoulder. NEUROPSYCHIATRIC:  Denies any fainting or falling out spells or  stroke. SKIN:  No rash. LYMPHATIC, HEMATOPOIETIC:  No nodes.   PHYSICAL EXAMINATION:  GENERAL:  A markedly obese 77 year old male, weight  286, blood pressure 150/82, pulse 81, respirations 20, temperature 96.5.  HEENT:  The ears and tympanic membranes are unremarkable. Eyes react normal  to light and accommodation. Extraocular movements intact. Pharynx  benign.  Teeth in fairly good repair.  HEART:  Normal sinus rhythm with no murmur detected.  LUNGS:  Clear to auscultation and percussion.  ABDOMEN:  Markedly obese, nontender. Liver, kidney, spleen, masses, tears or  hernia are not detected. There is a right lower quadrant scar.  GU:  External genitalia and meatus are normal. The penis is uncircumcised.  The testes are good size and symmetrical. Scrotum, anus, perineum normal.  RECTUM:  Rectal tone is poor. The prostate is 25 gm, soft, slightly nodular,  symmetrical, nontender.  EXTREMITIES:  Good peripheral pulses with just a trace of edema.  NEUROLOGICAL:  He seems to have normal sensation  and motor function.  SKIN:  No lesions noted.  LYMPHATIC:  No nodes noted.    IMPRESSION:  1. 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  2. Prostatitis onset in 1987.  3. Benign prostatic hypertrophy, last PSA 0.79 July 2004.  4. Diabetes with neuropathy.  5. Obesity, severe.  6. Hypertension.  7. Cardiac  arrhythmia on Coumadin.  8. Rectal fissures.   PLAN:  In conjunction with Dr. Earlene Plater we will perform a transurethral  resection of the bladder tumor on April 02, 2003.                                               Ethelene Browns., M.D.    HB/MEDQ  D:  03/29/2003  T:  03/29/2003  Job:  [id number]

## 2010-08-07 NOTE — Consult Note (Signed)
[name], [name]                 ACCOUNT NO.:  [id number]   MEDICAL RECORD NO.:  [id number]          PATIENT TYPE:  INP   LOCATION:  2015                         FACILITY:  [facility]   PHYSICIAN:  Bernette Redbird, M.D.   DATE OF BIRTH:  [birth year]   DATE OF CONSULTATION:  08/19/2005  DATE OF DISCHARGE:                                   CONSULTATION   HISTORY OF PRESENT ILLNESS:  Dr. Patty Sermons asked [place] to see this pleasant 77-  year-old gentleman because of GI bleeding.   Mr. Tetrick was admitted to the hospital today through Dr. Yevonne Pax office  following a several-day history of dark stools, confirmed on Dr. Yevonne Pax  rectal exam to be dark and heme-positive.  The patient's hemoglobin at the  outside hospital was 8.1, as compared to a baseline hemoglobin of  approximately 12 back in February.  That hemoglobin was checked at an  outside hospital while [place] was down [place] the beach [place] out of the  water.  The patient is maintained on Coumadin chronically for atrial  fibrillation and had a therapeutic INR of approximately 129, and he was  hemodynamically stable for Dr. Patty Sermons.   The patient has been seen occasionally in the past by my partner, Dr. Dorena Cookey, for GI bleeding.  He had endoscopic evaluation most recently in  January 2003, which was normal except for some mild antral gastritis, and  the patient's most recent colonoscopy, done for surveillance of colon  polyps, was approximately 6 months ago in October 2006 which was negative  except for, I believe, some diverticulosis.  That later procedure was  performed at the [facility] endoscopy Unit and the report is thus not available  on E-chart.   There is, however, apparently history of some memory loss problems for which  she is on Aricept.   The patient has had periodic GI bleeding of obscure origin with anemia in  the past.   ALLERGIES:  Allergy to Macrodantin.   CURRENT MEDICATIONS:  1.  Lanoxin.  2.   Glipizide.  3.  Coumadin.  4.  Aricept.  5.  Prilosec.  6.  Vitamins.  7.  Calcium.   PAST SURGICAL HISTORY:  1.  Appendectomy.  2.  Total knee replacement and cystoscopy.   PAST MEDICAL HISTORY:  1.  Medical illnesses include diabetes of approximately 20 years duration.  2.  Chronic atrial fibrillation on chronic anticoagulation.  3.  Bladder cancer, and history of recurrent anemia due to GI blood loss of      undetermined origin.   There is no history of coronary disease, CHF, COPD or hypertension,  according to the patient.   HABITS:  The patient stopped smoking some years ago.  He still drinks  moderate amounts of ethanol on pretty much a daily basis.   FAMILY HISTORY:  Biliary tract disease in his brother.  Negative for colon  cancer or ulcers.   SOCIAL HISTORY:  Married and his wife is at the bedside.  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   REVIEW OF SYSTEMS:  The patient is on Prilosec chronically.  He does tend to  run somewhat constipated and occasionally has to use Milk of Magnesia.  And of insertion now down to review of systems the patient is on Prilosec  chronically.  He does tend to run somewhat constipated and occasionally  history is known with amnesia.  No dysphagia, loss of weight. loss of  appetite, ongoing heartburn, abdominal symptoms, diarrhea or overt rectal  bleeding although as mentioned above he does have a history of some recent  dark stools.   PHYSICAL EXAMINATION:  GENERAL:  A stocky pleasant, somewhat pale appearing  Caucasian male in no acute distress.  Anicteric.  CHEST:  Clear.  HEART:  Rhythm irregularly irregular consistent with history of  atrial  fibrillation.  ABDOMEN:  Obese but without overt tenderness.  RECTAL:  Exam by Dr. Patty Sermons showed dark heme-positive stool and was not  repeated.   LABORATORY DATA:  Labs from this hospital are in the process of being  obtained and are not yet available but at the outside hospital, the patient  reportedly had a hemoglobin of 8.1 and an INR of 1.9.   Labs labs from this hospital are in the process of being obtained and are  not yet available but at the outside hospital, the patient reportedly had a  hemoglobin of 8.1 and an INR of 1.9   IMPRESSION:  Subacute gastrointestinal bleed most likely an upper tract  origin given the dark character to the stool.  The patient is not on  aspirin, so I would wonder about vascular ectasia or nonspecific gastritis  since the patient has had a recent colonoscopy and lower tract source of  bleeding seems relatively unlikely.   RECOMMENDATIONS:  I would favor endoscopic evaluation in the morning, the  purpose and risks of which were reviewed with the patient and he is  agreeable to proceed.  Further management would depend on the endoscopic  findings.           ______________________________  Bernette Redbird, M.D.     RB/MEDQ  D:  08/19/2005  T:  08/20/2005  Job:  [id number]   cc:   [name], M.D.  Fax: [phone]

## 2010-08-07 NOTE — Op Note (Signed)
NAME:  [name], [name]                           ACCOUNT NO.:  [id number]   MEDICAL RECORD NO.:  [id number]                   PATIENT TYPE:  AMB   LOCATION:  DAY                                  FACILITY:  [facility]   PHYSICIAN:  Ronald L. Ovidio Hanger, M.D.           DATE OF BIRTH:  1927-08-26   DATE OF PROCEDURE:  04/02/2003  DATE OF DISCHARGE:                                 OPERATIVE REPORT   PREOPERATIVE DIAGNOSIS:  Bladder lesion.   POSTOPERATIVE DIAGNOSIS:  Bladder lesion.   OPERATION/PROCEDURE:  1. Cystourethroscopy.  2. Examination under anesthesia.  3. Transurethral resection of bladder tumors x2.   SURGEON:  Lucrezia Starch. Earlene Plater, M.D.   ASSISTANT:  Retia Passe.   ANESTHESIA:  LMA.   ESTIMATED BLOOD LOSS:  Negligible.   TUBES:  22-French 10 mL Foley catheter.   COMPLICATIONS:  None.   INDICATIONS FOR PROCEDURE:  [name] is a very nice 77 year old white male  who has a history of prostatitis and benign prostatic hyperplasia.  He noted  two to three months of dysuria and some discomfort on the left side and  bloody urine for three to four days.  He was treated with antibiotics.  Subsequently had a cystoscopy in the office March 13, 2003, which  suggested possible nodular tumor of the right trigone.  CT scan suggested  some right iliac adenopathy.  Kidney showed no evidence of obstruction.  He  has been cleared by his medical doctor, [name], with a  Cardiolite test.  After understanding the risks, benefits and alternatives,  he has elected to proceed with cystoscopy and TURBT.   DESCRIPTION OF PROCEDURE:  The patient was placed in the supine position.  After proper LMA anesthesia, was placed in the dorsal lithotomy position,  prepped and draped with Betadine in the sterile fashion.  Cystourethroscopy  was performed with a 22.5-French Olympus panendoscope utilizing the 12-  degree and 70-degree lenses.  The bladder was carefully inspected.  There  was  moderate trilobar hypertrophy.  Grade 1 trabeculation was noted.  Efflux  of clear urine was noted from the left ureteral orifice but at the right  trigone, there was approximately a 0.5 cm tumor coming over the top of the  ureteral orifice.  Also on the left posterior bladder wall laterally, there  was a raised, reddish lesion.  No other lesions were noted to be present.  The panendoscope was removed after images had been taken by the camera and  the urethra was calibrated to 30-French with [employer] sounds and a 28-  French Olympus continuous flow resectoscope and sheath was placed over a  Timberlake obturator.  Inspection and orientation were performed.  Utilizing  the working element and a loop and a 12-degree lens, TUR of the bladder  tumor was performed of the trigonal tumor and a cold cup biopsy removal of  the raised lesion on the left  lateral bladder wall was performed and  submitted separately to pathology.  An amp of indigo carmine was given IV.  Inspection of the orifice revealed that it was very close to it, but there  was no coagulation cautery defect near it.  It was widely patent and efflux  of blue dye was noted.  There was one tiny bleeder that was coagulated in  the posterior portion of the TUR bladder tumor and the whole base of the  cold cup biopsy forceps area was coagulated with the hot loop.  Good  hemostasis was noted to be present.  Efflux of blue urine was noted from  both ureteral orifices.  The bladder was drained.  Panendocope was removed  and a 22-French 10 mL balloon catheter was placed in the bladder, and the  bladder was noted to irrigate clear.  The patient was taken to the recovery  room stable and all specimens were submitted to pathology.                                               Ronald L. Ovidio Hanger, M.D.    RLD/MEDQ  D:  04/02/2003  T:  04/02/2003  Job:  [id number]

## 2010-08-28 ENCOUNTER — Encounter: Payer: Self-pay | Admitting: Cardiology

## 2010-08-31 ENCOUNTER — Other Ambulatory Visit: Payer: Self-pay | Admitting: *Deleted

## 2010-08-31 DIAGNOSIS — Z79899 Other long term (current) drug therapy: Secondary | ICD-10-CM

## 2010-09-01 ENCOUNTER — Other Ambulatory Visit (INDEPENDENT_AMBULATORY_CARE_PROVIDER_SITE_OTHER): Payer: Medicare Other | Admitting: *Deleted

## 2010-09-01 ENCOUNTER — Encounter: Payer: Self-pay | Admitting: Cardiology

## 2010-09-01 ENCOUNTER — Ambulatory Visit (INDEPENDENT_AMBULATORY_CARE_PROVIDER_SITE_OTHER): Payer: Medicare Other | Admitting: Cardiology

## 2010-09-01 DIAGNOSIS — Z79899 Other long term (current) drug therapy: Secondary | ICD-10-CM

## 2010-09-01 DIAGNOSIS — F0390 Unspecified dementia without behavioral disturbance: Secondary | ICD-10-CM

## 2010-09-01 DIAGNOSIS — E119 Type 2 diabetes mellitus without complications: Secondary | ICD-10-CM | POA: Insufficient documentation

## 2010-09-01 DIAGNOSIS — I4891 Unspecified atrial fibrillation: Secondary | ICD-10-CM | POA: Insufficient documentation

## 2010-09-01 DIAGNOSIS — I119 Hypertensive heart disease without heart failure: Secondary | ICD-10-CM | POA: Insufficient documentation

## 2010-09-01 DIAGNOSIS — D500 Iron deficiency anemia secondary to blood loss (chronic): Secondary | ICD-10-CM

## 2010-09-01 NOTE — Progress Notes (Signed)
[name] Date of Birth:  Sep 17, 1927 [facility] [address]   [address] [place], [place]  [address] [phone]           Fax   [phone]  History of Present Illness: This pleasant 77 year old gentleman is seen for a scheduled 3 month followup visit.  He has a history of chronic established atrial fibrillation.  He is not on Coumadin and he is not on aspirin because of recurrent GI bleeds requiring multiple transfusions.  He has not been with any recent GI bleeding.  He has not had any TIA symptoms.  He denies chest pain or angina.  Having symptoms of congestive heart failure.  He does have some dependent per peripheral edema by the end of the day.  He has a history of essential hypertension.  He's had iron deficiency anemia.  Is also had a history of GERD, memory disorder, erectile dysfunction, diabetes mellitus, neuropathy, and arthritis.  Current Outpatient Prescriptions  Medication Sig Dispense Refill  . acetaminophen (TYLENOL) 650 MG CR tablet Take 650 mg by mouth as needed.        . COD LIVER OIL PO Take by mouth daily.       [name] donepezil (ARICEPT) 10 MG tablet TAKE 1 TABLET DAILY  90 tablet  2  . ferrous sulfate 325 (65 FE) MG EC tablet Take 325 mg by mouth 2 (two) times daily.        . folic acid (FOLVITE) 1 MG tablet Take 1 mg by mouth daily.        . furosemide (LASIX) 40 MG tablet Take 40 mg by mouth as needed.        . gabapentin (NEURONTIN) 300 MG capsule Take 1 capsule (300 mg total) by mouth at bedtime.  90 capsule  3  . glipiZIDE (GLUCOTROL) 5 MG tablet Take 5 mg by mouth daily.        . metoprolol tartrate (LOPRESSOR) 25 MG tablet as directed. Take 1/2 tablet daily        . Multiple Vitamin (MULTIVITAMIN) tablet Take 1 tablet by mouth daily.        . nitroGLYCERIN (NITROSTAT) 0.4 MG SL tablet Place 0.4 mg under the tongue every 5 (five) minutes as needed.        [name] oxybutynin (DITROPAN XL) 15 MG 24 hr tablet Take 1 tablet (15 mg total) by  mouth daily.  90 tablet  3  . pantoprazole (PROTONIX) 40 MG tablet Take 40 mg by mouth daily.        . Polyethylene Glycol 3350 (GLYCOLAX PO) Take by mouth as needed.        . tadalafil (CIALIS) 10 MG tablet Take 10 mg by mouth as needed.        [name] DISCONTD: fexofenadine (ALLEGRA) 180 MG tablet Take 180 mg by mouth as needed.        [name] DISCONTD: guaiFENesin (MUCINEX) 600 MG 12 hr tablet Take 600 mg by mouth as needed.          Allergies  Allergen Reactions  . Furacin (Nitrofurazone)   . Keflex   . Macrodantin   . Zestril (Lisinopril)     Patient Active Problem List  Diagnoses  . Atrial fibrillation  . Iron deficiency anemia due to chronic blood loss  . Diabetes mellitus  . Dementia  . Benign hypertensive heart disease without heart failure    History  Smoking status  . Former Smoker  .  Quit date: 08/28/1975  Smokeless tobacco  . Not on file    History  Alcohol Use No    No family history on file.  Review of Systems: Constitutional: no fever chills diaphoresis or fatigue or change in weight.  Head and neck: no hearing loss, no epistaxis, no photophobia or visual disturbance. Respiratory: No cough, shortness of breath or wheezing. Cardiovascular: No chest pain peripheral edema, palpitations. Gastrointestinal: No abdominal distention, no abdominal pain, no change in bowel habits hematochezia or melena. Genitourinary: No dysuria, , no urgency, no nocturia.The [name] Musculoskeletal:No arthralgias, no back pain, no gait disturbance or myalgias. Neurological: No dizziness, no headaches, no numbness, no seizures, no syncope, no weakness, no tremors. Hematologic: No lymphadenopathy, no easy bruising. Psychiatric: No confusion, no hallucinations, no sleep disturbance.Possible situational depression according to his wife.    Physical Exam: Filed Vitals:   09/01/10 1535  BP: 120/60  Pulse: 69  The  general appearance reveals a well-developed large gentleman in no distress.The head and neck exam reveals pupils equal and reactive.  Extraocular movements are full.  There is no scleral icterus.  The mouth and pharynx are normal.  The neck is supple.  The carotids reveal no bruits.  The jugular venous pressure is normal.  The  thyroid is not enlarged.  There is no lymphadenopathy.  The chest is clear to percussion and auscultation.  There are no rales or rhonchi.  Expansion of the chest is symmetrical.  The precordium is quiet.  The first heart sound is normal.  The second heart sound is physiologically split.  There is no murmur gallop rub or click.  There is no abnormal lift or heave.The rhythm is irregular  The abdomen is soft and nontender.  The bowel sounds are normal.  The liver and spleen are not enlarged.  There are no abdominal masses.  There are no abdominal bruits.  Extremities reveal good pedal pulses.  There is no phlebitis or edema.  There is no cyanosis or clubbing.  Strength is normal and symmetrical in all extremities.  There is no lateralizing weakness.  There are no sensory deficits.  The skin is warm and dry.  There is no rash.   Assessment / Plan: Continue present medication.  Recheck in 3 months for followup office visit lipid panel thyroid function panel, basal metabolic panel, and CBC

## 2010-09-01 NOTE — Assessment & Plan Note (Signed)
The patient has a history of early dementia.  He also has peripheral neuropathy.  He is on Aricept as well as Neurontin.  His wife thinks that he may be depressed.  When we get his blood work back from today we will talk to him about possible antidepressant.

## 2010-09-01 NOTE — Assessment & Plan Note (Signed)
The patient has a history of chronic atrial fibrillation.  He is no longer on Coumadin or aspirin because of recurrent episodes of severe GI bleeding.  He has not had any TIA symptoms or stroke symptoms.  He denies any chest pain or shortness of breath.  He's not having any symptoms of congestive heart failure.

## 2010-09-01 NOTE — Assessment & Plan Note (Signed)
The patient has a history of adult onset diabetes mellitus.  He is trying to control his weight and his diet has been more careful.  His weight is down 12 pounds since last visit.  He is not having any hypoglycemic episodes

## 2010-09-02 LAB — CBC WITH DIFFERENTIAL/PLATELET
Basophils Absolute: 0 10*3/uL (ref 0.0–0.1)
Eosinophils Absolute: 0.1 10*3/uL (ref 0.0–0.7)
HCT: 37.5 % — ABNORMAL LOW (ref 39.0–52.0)
Hemoglobin: 12.7 g/dL — ABNORMAL LOW (ref 13.0–17.0)
Lymphs Abs: 0.8 10*3/uL (ref 0.7–4.0)
MCHC: 33.9 g/dL (ref 30.0–36.0)
Monocytes Absolute: 0.5 10*3/uL (ref 0.1–1.0)
Neutro Abs: 3 10*3/uL (ref 1.4–7.7)
Platelets: 158 10*3/uL (ref 150.0–400.0)
RDW: 14 % (ref 11.5–14.6)

## 2010-09-02 LAB — BASIC METABOLIC PANEL
BUN: 12 mg/dL (ref 6–23)
CO2: 30 mEq/L (ref 19–32)
Glucose, Bld: 79 mg/dL (ref 70–99)
Potassium: 4.3 mEq/L (ref 3.5–5.1)
Sodium: 139 mEq/L (ref 135–145)

## 2010-09-03 ENCOUNTER — Telehealth: Payer: Self-pay | Admitting: Cardiology

## 2010-09-03 NOTE — Telephone Encounter (Signed)
Calling for lab work results

## 2010-09-04 NOTE — Telephone Encounter (Signed)
No answer

## 2010-09-07 NOTE — Telephone Encounter (Signed)
Advised wife of lab results.  

## 2010-09-28 ENCOUNTER — Other Ambulatory Visit: Payer: Self-pay | Admitting: Cardiology

## 2010-09-29 ENCOUNTER — Other Ambulatory Visit: Payer: Self-pay | Admitting: *Deleted

## 2010-09-29 MED ORDER — METOPROLOL TARTRATE 25 MG PO TABS: 25.0000 mg | ORAL_TABLET | ORAL | Status: DC

## 2010-09-29 MED ORDER — PANTOPRAZOLE SODIUM 40 MG PO TBEC: 40.0000 mg | DELAYED_RELEASE_TABLET | Freq: Every day | ORAL | Status: DC

## 2010-09-29 NOTE — Telephone Encounter (Signed)
Fax received from pharmacy. Refill completed. Jodette Gaudencio Chesnut RN  

## 2010-10-13 ENCOUNTER — Telehealth: Payer: Self-pay | Admitting: Cardiology

## 2010-10-13 NOTE — Telephone Encounter (Signed)
Spoke with [facility], and patient has remaining refills.  Called wife back and gave her RX # and advised she needed to call per [employer]

## 2010-10-13 NOTE — Telephone Encounter (Signed)
Patient was sitting in front of me after she checked in wanting to request a refill of her husbands gabapentin refilled at [facility]. Please call back. I could not find the file.

## 2010-12-03 ENCOUNTER — Telehealth: Payer: Self-pay | Admitting: Cardiology

## 2010-12-03 DIAGNOSIS — E119 Type 2 diabetes mellitus without complications: Secondary | ICD-10-CM

## 2010-12-03 MED ORDER — GLIPIZIDE 5 MG PO TABS: 5.0000 mg | ORAL_TABLET | Freq: Every day | ORAL | Status: DC

## 2010-12-03 NOTE — Telephone Encounter (Signed)
Called needing a refill of her husbands glipiZIDE (GLUCOTROL) 5 MG tablet faxed into [facility] and was also wondering if he still needed to be taking calcium. Please call back. I have pulled his chart.

## 2010-12-03 NOTE — Telephone Encounter (Signed)
Rx sent, left message to continue same dose of medications until OV next week

## 2010-12-07 ENCOUNTER — Telehealth: Payer: Self-pay | Admitting: Cardiology

## 2010-12-07 NOTE — Telephone Encounter (Signed)
Discussed with wife and advised didn't know what this test was.  They will check with urologist

## 2010-12-07 NOTE — Telephone Encounter (Signed)
Pt's urologist wants pt to have , per pt's wife fish urine test

## 2010-12-09 ENCOUNTER — Ambulatory Visit (INDEPENDENT_AMBULATORY_CARE_PROVIDER_SITE_OTHER): Payer: Medicare Other | Admitting: *Deleted

## 2010-12-09 ENCOUNTER — Encounter: Payer: Self-pay | Admitting: Cardiology

## 2010-12-09 ENCOUNTER — Ambulatory Visit (INDEPENDENT_AMBULATORY_CARE_PROVIDER_SITE_OTHER): Payer: Medicare Other | Admitting: Cardiology

## 2010-12-09 VITALS — BP 138/68 | HR 80 | Wt 251.0 lb

## 2010-12-09 DIAGNOSIS — E119 Type 2 diabetes mellitus without complications: Secondary | ICD-10-CM

## 2010-12-09 DIAGNOSIS — I4891 Unspecified atrial fibrillation: Secondary | ICD-10-CM

## 2010-12-09 DIAGNOSIS — R0602 Shortness of breath: Secondary | ICD-10-CM

## 2010-12-09 DIAGNOSIS — I119 Hypertensive heart disease without heart failure: Secondary | ICD-10-CM

## 2010-12-09 DIAGNOSIS — D509 Iron deficiency anemia, unspecified: Secondary | ICD-10-CM

## 2010-12-09 DIAGNOSIS — Z139 Encounter for screening, unspecified: Secondary | ICD-10-CM

## 2010-12-09 DIAGNOSIS — D649 Anemia, unspecified: Secondary | ICD-10-CM

## 2010-12-09 DIAGNOSIS — I10 Essential (primary) hypertension: Secondary | ICD-10-CM

## 2010-12-09 LAB — CBC WITH DIFFERENTIAL/PLATELET
Eosinophils Relative: 1.2 % (ref 0.0–5.0)
Monocytes Absolute: 0.5 10*3/uL (ref 0.1–1.0)
Monocytes Relative: 10.6 % (ref 3.0–12.0)
Neutrophils Relative %: 68.5 % (ref 43.0–77.0)
Platelets: 163 10*3/uL (ref 150.0–400.0)
WBC: 4.7 10*3/uL (ref 4.5–10.5)

## 2010-12-09 LAB — LIPID PANEL: Cholesterol: 135 mg/dL (ref 0–200)

## 2010-12-09 LAB — HEMOGLOBIN A1C: Hgb A1c MFr Bld: 5.2 % (ref 4.6–6.5)

## 2010-12-09 LAB — BASIC METABOLIC PANEL
BUN: 11 mg/dL (ref 6–23)
Chloride: 101 mEq/L (ref 96–112)
Glucose, Bld: 105 mg/dL — ABNORMAL HIGH (ref 70–99)
Potassium: 4.3 mEq/L (ref 3.5–5.1)

## 2010-12-09 LAB — HEPATIC FUNCTION PANEL
ALT: 22 U/L (ref 0–53)
AST: 28 U/L (ref 0–37)
Albumin: 4.3 g/dL (ref 3.5–5.2)
Total Protein: 7 g/dL (ref 6.0–8.3)

## 2010-12-09 NOTE — Assessment & Plan Note (Signed)
The patient is not having any symptoms of congestive heart failure.  He is not having any TIA symptoms.  He is not on Coumadin or anticoagulants because of his chronic GI bleeding

## 2010-12-09 NOTE — Assessment & Plan Note (Signed)
The patient denies any headaches or dizziness.  No orthopnea or paroxysmal nocturnal dyspnea.

## 2010-12-09 NOTE — Assessment & Plan Note (Signed)
The patient is only moderately careful with his diet.  He enjoys eating.  He's not having any hypoglycemic episodes.

## 2010-12-09 NOTE — Progress Notes (Signed)
Belenda Cruise Date of Birth:  09/12/1927 [facility] / [facility] [address]   [address] [place], [place]  [address] [phone]           Fax   [phone]  History of Present Illness: This pleasant 77 year old gentleman is seen for a scheduled followup office visit.  He has chronic atrial fibrillation.  He is no longer on Coumadin because of recurrent GI bleeding.  He has a history of chronic iron deficiency anemia secondary to GI bleeding and is on iron therapy.  He has a history of mild dementia.  He has a history of diabetes mellitus.  He Has had benign hypertensive heart disease without heart failure.  Current Outpatient Prescriptions  Medication Sig Dispense Refill  . acetaminophen (TYLENOL) 650 MG CR tablet Take 650 mg by mouth as needed.        . COD LIVER OIL PO Take by mouth daily.       [name] donepezil (ARICEPT) 10 MG tablet TAKE 1 TABLET DAILY  90 tablet  2  . ferrous sulfate 325 (65 FE) MG EC tablet Take 325 mg by mouth 1 day or 1 dose.       . folic acid (FOLVITE) 1 MG tablet Take 1 mg by mouth daily.        . furosemide (LASIX) 40 MG tablet Take 40 mg by mouth as needed.        . gabapentin (NEURONTIN) 300 MG capsule Take 1 capsule (300 mg total) by mouth at bedtime.  90 capsule  3  . glipiZIDE (GLUCOTROL) 5 MG tablet Take 1 tablet (5 mg total) by mouth daily.  90 tablet  3  . metoprolol tartrate (LOPRESSOR) 25 MG tablet Take 1 tablet (25 mg total) by mouth as directed. Take 1/2 tablet daily   90 tablet  2  . Multiple Vitamin (MULTIVITAMIN) tablet Take 1 tablet by mouth daily.        . nitroGLYCERIN (NITROSTAT) 0.4 MG SL tablet Place 0.4 mg under the tongue every 5 (five) minutes as needed.        [name] oxybutynin (DITROPAN XL) 15 MG 24 hr tablet Take 1 tablet (15 mg total) by mouth daily.  90 tablet  3  . pantoprazole (PROTONIX) 40 MG tablet Take 1 tablet (40 mg total) by mouth daily.  90 tablet  2  . Polyethylene Glycol 3350 (GLYCOLAX PO) Take by mouth as  needed.        . tadalafil (CIALIS) 10 MG tablet Take 10 mg by mouth as needed.          Allergies  Allergen Reactions  . Furacin (Nitrofurazone)   . Keflex   . Macrodantin   . Zestril (Lisinopril)     Patient Active Problem List  Diagnoses  . Atrial fibrillation  . Iron deficiency anemia due to chronic blood loss  . Diabetes mellitus  . Dementia  . Benign hypertensive heart disease without heart failure    History  Smoking status  . Former Smoker  . Quit date: 08/28/1975  Smokeless tobacco  . Not on file    History  Alcohol Use No    No family history on file.  Review of Systems: Constitutional: no fever chills diaphoresis or fatigue or change in weight.  Head and neck: no hearing loss, no epistaxis, no photophobia or visual disturbance. Respiratory: No cough, shortness of breath or wheezing. Cardiovascular: No chest pain peripheral edema, palpitations. Gastrointestinal: No abdominal distention, no abdominal  pain, no change in bowel habits hematochezia or melena. Genitourinary: No dysuria, no frequency, no urgency, no nocturia. Musculoskeletal:No arthralgias, no back pain, no gait disturbance or myalgias. Neurological: No dizziness, no headaches, no numbness, no seizures, no syncope, no weakness, no tremors. Hematologic: No lymphadenopathy, no easy bruising. Psychiatric: No confusion, no hallucinations, no sleep disturbance.    Physical Exam: Filed Vitals:   12/09/10 1519  BP: 138/68  Pulse: 80  The general appearance reveals a large gentleman in no acute distress.Pupils equal and reactive.   Extraocular Movements are full.  There is no scleral icterus.  The mouth and pharynx are normal.  The neck is supple.  The carotids reveal no bruits.  The jugular venous pressure is normal.  The thyroid is not enlarged.  There is no lymphadenopathy.  The chest is clear to percussion and auscultation. There are no rales or rhonchi. Expansion of the chest is  symmetrical.    The heart reveals an irregular rhythm.  No murmur gallop or rub.The abdomen is soft and nontender. Bowel sounds are normal. The liver and spleen are not enlarged. There Are no abdominal masses. There are no bruits.  The pedal pulses are good.  There is no phlebitis or edema.  There is no cyanosis or clubbing.  Strength is normal and symmetrical in all extremities.  There is no lateralizing weakness.  There are no sensory deficits.     Assessment / Plan:  We're checking blood work today.  He had been off his iron tablets until a week ago.  Prior to that he had been eating black strap molasses in the place of his iron pill.

## 2010-12-11 ENCOUNTER — Telehealth: Payer: Self-pay | Admitting: *Deleted

## 2010-12-11 NOTE — Telephone Encounter (Signed)
Advised patient of lab results and to continue taking his iron tablets

## 2010-12-11 NOTE — Progress Notes (Signed)
Advised patient of lab results  

## 2010-12-11 NOTE — Telephone Encounter (Signed)
Message copied by Royanne Foots on Fri Dec 11, 2010 10:25 AM ------      Message from: [name]      Created: Thu Dec 10, 2010  9:43 PM       Does report.  Labs are satisfactory.  The cholesterol is low at 135.The blood sugar was 105 And of the hemoglobin A1c is satisfactory at 5.2 which is improved.The liver tests are normal and the hemoglobin is12.1 which is down slightly so continue iron tablets

## 2011-01-08 ENCOUNTER — Telehealth: Payer: Self-pay | Admitting: Cardiology

## 2011-01-08 NOTE — Telephone Encounter (Signed)
10/19--1530pm--LMTCB--nt

## 2011-01-08 NOTE — Telephone Encounter (Signed)
Pt's wife calling re what BP had been when here, lately seems low

## 2011-01-12 NOTE — Telephone Encounter (Signed)
Left message if he is still having problems with his blood pressure to call us back.  Never heard back from message on 10/19

## 2011-02-15 ENCOUNTER — Ambulatory Visit (INDEPENDENT_AMBULATORY_CARE_PROVIDER_SITE_OTHER): Payer: Medicare Other | Admitting: Internal Medicine

## 2011-02-15 ENCOUNTER — Encounter: Payer: Self-pay | Admitting: Internal Medicine

## 2011-02-15 DIAGNOSIS — I4891 Unspecified atrial fibrillation: Secondary | ICD-10-CM

## 2011-02-15 DIAGNOSIS — E119 Type 2 diabetes mellitus without complications: Secondary | ICD-10-CM

## 2011-02-15 DIAGNOSIS — I119 Hypertensive heart disease without heart failure: Secondary | ICD-10-CM

## 2011-02-15 DIAGNOSIS — F0390 Unspecified dementia without behavioral disturbance: Secondary | ICD-10-CM

## 2011-02-15 DIAGNOSIS — D500 Iron deficiency anemia secondary to blood loss (chronic): Secondary | ICD-10-CM

## 2011-02-15 NOTE — Assessment & Plan Note (Signed)
Lab Results  Component Value Date   HGBA1C 5.2 12/09/2010   Excellent control on present regimen. No change in regimen

## 2011-02-15 NOTE — Assessment & Plan Note (Signed)
Stable with no chest pain.  Plan - continue present medication.

## 2011-02-15 NOTE — Progress Notes (Signed)
Subjective:    Patient ID: [name], male    DOB: 1928-02-07, 77 y.o.   MRN: [id number]  HPI [name] presents to establish for on-going care. He was followed by Dr. Patty [name], who now restricts his practice to cardiology. He reports that for the last week he has been mildly ataxic. Denies any sensation of movement, no double vision, no focal weakness, no paresthesia, no HA.  Has smoke alarms. Keeps firearms   Review of Systems Constitutional:  Negative for fever, chills, activity change and unexpected weight change.  HEENT:  Negative for hearing loss, ear pain, congestion, neck stiffness and postnasal drip. Negative for sore throat or swallowing problems. Full dentures.Eyes: Negative for vision loss or change in visual acuity.  Respiratory: Negative for chest tightness and wheezing. Negative for DOE.   Cardiovascular: Negative for chest pain or palpitations. No decreased exercise tolerance Gastrointestinal: No change in bowel habit. No bloating or gas. No reflux or indigestion Genitourinary: Negative for urgency, frequency, flank pain and difficulty urinating. Nocturia x 2 Musculoskeletal: Negative for myalgias, back pain, arthralgias and gait problem.  Neurological: Negative for dizziness, tremors, weakness and headaches.  Hematological: Negative for adenopathy.  Psychiatric/Behavioral: Negative for behavioral problems and dysphoric mood.   Current Outpatient Prescriptions on File Prior to Visit  Medication Sig Dispense Refill  . acetaminophen (TYLENOL) 650 MG CR tablet Take 650 mg by mouth as needed.        . COD LIVER OIL PO Take by mouth daily.       [name] donepezil (ARICEPT) 10 MG tablet TAKE 1 TABLET DAILY  90 tablet  2  . ferrous sulfate 325 (65 FE) MG EC tablet Take 325 mg by mouth 1 day or 1 dose.       . folic acid (FOLVITE) 1 MG tablet Take 1 mg by mouth daily.        . furosemide (LASIX) 40 MG tablet Take 40 mg by mouth as needed.        . gabapentin (NEURONTIN) 300 MG  capsule Take 1 capsule (300 mg total) by mouth at bedtime.  90 capsule  3  . glipiZIDE (GLUCOTROL) 5 MG tablet Take 1 tablet (5 mg total) by mouth daily.  90 tablet  3  . metoprolol tartrate (LOPRESSOR) 25 MG tablet Take 1 tablet (25 mg total) by mouth as directed. Take 1/2 tablet daily   90 tablet  2  . Multiple Vitamin (MULTIVITAMIN) tablet Take 1 tablet by mouth daily.        . nitroGLYCERIN (NITROSTAT) 0.4 MG SL tablet Place 0.4 mg under the tongue every 5 (five) minutes as needed.        [name] oxybutynin (DITROPAN XL) 15 MG 24 hr tablet Take 1 tablet (15 mg total) by mouth daily.  90 tablet  3  . pantoprazole (PROTONIX) 40 MG tablet Take 1 tablet (40 mg total) by mouth daily.  90 tablet  2  . Polyethylene Glycol 3350 (GLYCOLAX PO) Take by mouth as needed.        . tadalafil (CIALIS) 10 MG tablet Take 10 mg by mouth as needed.               Objective:   Physical Exam Vitals - low grade fever, mildly elevated BP Gen'l- large framed overweight white male in no distress HEENT - [place]/AT, C&S clear Pulm - normal respirations Cor - IRIR, II/VI systolic mm Abd - obese Neuro - A&O x 3, memory not tested, CN II-XII  grossly intact, cerebellar function- no tremor, nl gait       Assessment & Plan:

## 2011-02-15 NOTE — Assessment & Plan Note (Addendum)
Speech rambles a little. No formal memory testing.   Plan - continue aricept

## 2011-02-15 NOTE — Assessment & Plan Note (Signed)
Lab Results  Component Value Date   WBC 4.7 12/09/2010   HGB 12.1* 12/09/2010   HCT 37.3* 12/09/2010   MCV 89.2 12/09/2010   PLT 163.0 12/09/2010   Plan - continue iron therapy           Periodic H/H when there are signs of active bleeding.

## 2011-02-15 NOTE — Assessment & Plan Note (Signed)
IRIR rhythm on exam with good rate control.  Plan - no change in regimen

## 2011-02-19 ENCOUNTER — Ambulatory Visit: Payer: Medicare Other | Admitting: Internal Medicine

## 2011-03-24 ENCOUNTER — Ambulatory Visit: Payer: Medicare Other | Admitting: Internal Medicine

## 2011-04-05 ENCOUNTER — Telehealth: Payer: Self-pay | Admitting: *Deleted

## 2011-04-05 DIAGNOSIS — R413 Other amnesia: Secondary | ICD-10-CM

## 2011-04-05 MED ORDER — DONEPEZIL HCL 10 MG PO TABS: 10.0000 mg | ORAL_TABLET | Freq: Every day | ORAL | Status: DC

## 2011-04-05 NOTE — Telephone Encounter (Signed)
Refill request Aircept 10mg .

## 2011-04-07 ENCOUNTER — Other Ambulatory Visit: Payer: Self-pay | Admitting: *Deleted

## 2011-04-07 DIAGNOSIS — R413 Other amnesia: Secondary | ICD-10-CM

## 2011-04-07 MED ORDER — DONEPEZIL HCL 10 MG PO TABS
10.0000 mg | ORAL_TABLET | Freq: Every day | ORAL | Status: DC
Start: 2011-04-07 — End: 2011-12-01

## 2011-04-08 ENCOUNTER — Ambulatory Visit: Payer: Medicare Other | Admitting: Internal Medicine

## 2011-04-13 ENCOUNTER — Ambulatory Visit: Payer: Medicare Other | Admitting: Cardiology

## 2011-04-15 ENCOUNTER — Ambulatory Visit (INDEPENDENT_AMBULATORY_CARE_PROVIDER_SITE_OTHER): Payer: Medicare Other | Admitting: Cardiology

## 2011-04-15 ENCOUNTER — Encounter: Payer: Self-pay | Admitting: Cardiology

## 2011-04-15 VITALS — BP 128/68 | HR 70 | Ht 75.0 in | Wt 262.0 lb

## 2011-04-15 DIAGNOSIS — I119 Hypertensive heart disease without heart failure: Secondary | ICD-10-CM

## 2011-04-15 DIAGNOSIS — E119 Type 2 diabetes mellitus without complications: Secondary | ICD-10-CM

## 2011-04-15 DIAGNOSIS — I4891 Unspecified atrial fibrillation: Secondary | ICD-10-CM

## 2011-04-15 DIAGNOSIS — D509 Iron deficiency anemia, unspecified: Secondary | ICD-10-CM

## 2011-04-15 DIAGNOSIS — D500 Iron deficiency anemia secondary to blood loss (chronic): Secondary | ICD-10-CM

## 2011-04-15 DIAGNOSIS — R011 Cardiac murmur, unspecified: Secondary | ICD-10-CM

## 2011-04-15 NOTE — Patient Instructions (Signed)
Your physician has requested that you have an echocardiogram. Echocardiography is a painless test that uses sound waves to create images of your heart. It provides your doctor with information about the size and shape of your heart and how well your heart's chambers and valves are working. This procedure takes approximately one hour. There are no restrictions for this procedure.  Your physician recommends that you schedule a follow-up appointment in: 4 months with dr Patty Sermons  Your physician recommends that you return for lab work in: 4 months for cbc and bmp

## 2011-04-15 NOTE — Assessment & Plan Note (Signed)
The patient has not been experiencing any orthopnea or paroxysmal nocturnal dyspnea.  He does not have any history of ischemic heart disease.  He had a normal cardiac catheterization in 2003

## 2011-04-15 NOTE — Progress Notes (Signed)
Belenda Cruise Date of Birth:  07-22-1927 [facility] [address] [place], [place]  [address] [phone]         Fax   [phone]  History of Present Illness: This pleasant 78 year old gentleman is seen for a scheduled followup office visit.  He has a history of established atrial fibrillation.  He has a history of recurrent severe GI bleeding.  He is on chronic iron therapy.  He cannot be on Coumadin because of recurrent GI bleeds.  He has a history of mild diabetes mellitus and mild dementia.  His past history of mild high blood pressure without heart failure.  Current Outpatient Prescriptions  Medication Sig Dispense Refill  . acetaminophen (TYLENOL) 650 MG CR tablet Take 650 mg by mouth as needed.        . COD LIVER OIL PO Take by mouth daily.       [name] donepezil (ARICEPT) 10 MG tablet Take 1 tablet (10 mg total) by mouth daily.  90 tablet  2  . ferrous sulfate 325 (65 FE) MG EC tablet Take 325 mg by mouth 1 day or 1 dose.       . folic acid (FOLVITE) 1 MG tablet Take 1 mg by mouth daily.        . furosemide (LASIX) 40 MG tablet Take 40 mg by mouth as needed.        . gabapentin (NEURONTIN) 300 MG capsule Take 1 capsule (300 mg total) by mouth at bedtime.  90 capsule  3  . glipiZIDE (GLUCOTROL) 5 MG tablet Take 1 tablet (5 mg total) by mouth daily.  90 tablet  3  . metoprolol tartrate (LOPRESSOR) 25 MG tablet Take 1 tablet (25 mg total) by mouth as directed. Take 1/2 tablet daily   90 tablet  2  . Multiple Vitamin (MULTIVITAMIN) tablet Take 1 tablet by mouth daily.        . nitroGLYCERIN (NITROSTAT) 0.4 MG SL tablet Place 0.4 mg under the tongue every 5 (five) minutes as needed.        [name] oxybutynin (DITROPAN XL) 15 MG 24 hr tablet Take 1 tablet (15 mg total) by mouth daily.  90 tablet  3  . pantoprazole (PROTONIX) 40 MG tablet Take 1 tablet (40 mg total) by mouth daily.  90 tablet  2  . Polyethylene Glycol 3350 (GLYCOLAX PO) Take by mouth as needed.        .  tadalafil (CIALIS) 10 MG tablet Take 10 mg by mouth as needed.          Allergies  Allergen Reactions  . Furacin (Nitrofurazone)   . Keflex   . Macrodantin   . Zestril (Lisinopril)     Patient Active Problem List  Diagnoses  . Atrial fibrillation  . Iron deficiency anemia due to chronic blood loss  . Diabetes mellitus  . Dementia  . Benign hypertensive heart disease without heart failure    History  Smoking status  . Former Smoker  . Quit date: 08/28/1975  Smokeless tobacco  . Former User    History  Alcohol Use  . 3.5 oz/week  . 7 drink(s) per week    Family History  Problem Relation Age of Onset  . Kidney disease Mother   . Heart disease Mother   . Stroke Mother   . Cancer Father     prostate  . Diabetes Neg Hx   . COPD Neg Hx   . Mental illness Brother  Review of Systems: Constitutional: no fever chills diaphoresis or fatigue or change in weight.  Head and neck: no hearing loss, no epistaxis, no photophobia or visual disturbance. Respiratory: No cough, shortness of breath or wheezing. Cardiovascular: No chest pain peripheral edema, palpitations. Gastrointestinal: No abdominal distention, no abdominal pain, no change in bowel habits hematochezia or melena. Genitourinary: No dysuria, no frequency, no urgency, no nocturia. Musculoskeletal:No arthralgias, no back pain, no gait disturbance or myalgias. Neurological: No dizziness, no headaches, no numbness, no seizures, no syncope, no weakness, no tremors. Hematologic: No lymphadenopathy, no easy bruising. Psychiatric: No confusion, no hallucinations, no sleep disturbance.    Physical Exam: Filed Vitals:   04/15/11 1520  BP: 128/68  Pulse: 70   The general appearance reveals a well-developed well-nourished moderately overweight gentleman in no distress.Pupils equal and reactive.   Extraocular Movements are full.  There is no scleral icterus.  The mouth and pharynx are normal.  The neck is supple.  The  carotids reveal no bruits.  The jugular venous pressure is normal.  The thyroid is not enlarged.  There is no lymphadenopathy.  The chest is clear to percussion and auscultation. There are no rales or rhonchi. Expansion of the chest is symmetrical.  The heart reveals a soft systolic ejection murmur at the base.  No diastolic murmur.  No gallop or rub.The abdomen is soft and nontender. Bowel sounds are normal. The liver and spleen are not enlarged. There Are no abdominal masses. There are no bruits.  The pedal pulses are good.  There is no phlebitis or edema.  There is no cyanosis or clubbing. Strength is normal and symmetrical in all extremities.  There is no lateralizing weakness.  There are no sensory deficits.  The skin is warm and dry.  There is no rash.   Assessment / Plan: I want him to work harder on weight loss.  He will continue same medication.  For evaluation of his systolic ejection murmur at the base and his atrial fibrillation we will have him return soon for an echocardiogram. Recheck in 4 months for followup office visit CBC and basal metabolic panel

## 2011-04-15 NOTE — Assessment & Plan Note (Signed)
The patient is on glipizide has not been experiencing any hypoglycemic episodes.

## 2011-04-15 NOTE — Assessment & Plan Note (Signed)
The patient has not been having any symptoms of congestive heart failure.  He has not had any TIA symptoms.

## 2011-04-15 NOTE — Assessment & Plan Note (Signed)
The patient has not been aware of any recent hematochezia or melena.  He eats black strep no masses to increase his intake of oral iron.  Plan to recheck his hemoglobin and his next visit

## 2011-04-21 ENCOUNTER — Ambulatory Visit (HOSPITAL_COMMUNITY): Payer: Medicare Other | Attending: Cardiology

## 2011-04-21 ENCOUNTER — Other Ambulatory Visit (HOSPITAL_COMMUNITY): Payer: Self-pay | Admitting: Cardiology

## 2011-04-21 DIAGNOSIS — I4891 Unspecified atrial fibrillation: Secondary | ICD-10-CM | POA: Diagnosis not present

## 2011-04-21 DIAGNOSIS — IMO0002 Reserved for concepts with insufficient information to code with codable children: Secondary | ICD-10-CM

## 2011-04-21 DIAGNOSIS — E119 Type 2 diabetes mellitus without complications: Secondary | ICD-10-CM | POA: Diagnosis not present

## 2011-04-21 DIAGNOSIS — I10 Essential (primary) hypertension: Secondary | ICD-10-CM | POA: Diagnosis not present

## 2011-04-21 DIAGNOSIS — M5430 Sciatica, unspecified side: Secondary | ICD-10-CM | POA: Diagnosis not present

## 2011-04-21 DIAGNOSIS — F0390 Unspecified dementia without behavioral disturbance: Secondary | ICD-10-CM | POA: Insufficient documentation

## 2011-04-21 DIAGNOSIS — Z87891 Personal history of nicotine dependence: Secondary | ICD-10-CM | POA: Insufficient documentation

## 2011-04-21 MED ORDER — PERFLUTREN PROTEIN A MICROSPH IV SUSP
2.0000 mL | Freq: Once | INTRAVENOUS | Status: AC
  Administered 2011-04-21: 2 mL via INTRAVENOUS

## 2011-04-22 ENCOUNTER — Telehealth: Payer: Self-pay | Admitting: *Deleted

## 2011-04-22 NOTE — Telephone Encounter (Signed)
Message copied by Burnell Blanks on Thu Apr 22, 2011  4:18 PM ------      Message from: [name]      Created: Thu Apr 22, 2011 12:34 PM       Please report.  The echo shows normal systolic function.  The aortic valve show mild regurgitation. The mitral valve shows moderate regurgitation.  No need for any valve surgery at this point.  Watch diet, avoid salt, lose weight etc.

## 2011-04-22 NOTE — Telephone Encounter (Signed)
Advised patient

## 2011-04-23 DIAGNOSIS — M5430 Sciatica, unspecified side: Secondary | ICD-10-CM | POA: Diagnosis not present

## 2011-05-12 ENCOUNTER — Ambulatory Visit (INDEPENDENT_AMBULATORY_CARE_PROVIDER_SITE_OTHER): Payer: Medicare Other | Admitting: Internal Medicine

## 2011-05-12 ENCOUNTER — Encounter: Payer: Self-pay | Admitting: Internal Medicine

## 2011-05-12 DIAGNOSIS — D500 Iron deficiency anemia secondary to blood loss (chronic): Secondary | ICD-10-CM | POA: Diagnosis not present

## 2011-05-12 DIAGNOSIS — F0390 Unspecified dementia without behavioral disturbance: Secondary | ICD-10-CM

## 2011-05-12 DIAGNOSIS — E119 Type 2 diabetes mellitus without complications: Secondary | ICD-10-CM

## 2011-05-12 DIAGNOSIS — I4891 Unspecified atrial fibrillation: Secondary | ICD-10-CM

## 2011-05-12 NOTE — Patient Instructions (Signed)
Thanks for coming back. You seem to be doing well. For the runny nose take claritin or allegra (they both come as generics). Do not take decongestants.   Please continue all your other medications.

## 2011-05-12 NOTE — Progress Notes (Signed)
  Subjective:    Patient ID: [name], male    DOB: 09/14/1927, 78 y.o.   MRN: [id number]  HPI [name] presents for follow-up after being seen as a new patient. In the interval he has been doing well. He saw Dr. Patty Sermons for follow-up - note reviewed. He did have a 2D echo Jan 30, '13: EF 60-65%, mild aortic regurg, mild tricuspid regurg, normal wall motion.  Patient's only complaint is rhinorrhea  I have reviewed the patient's medical history in detail and updated the computerized patient record.    Review of Systems System review is negative for any constitutional, cardiac, pulmonary, GI or neuro symptoms or complaints other than as described in the HPI.      Objective:   Physical Exam Filed Vitals:   05/12/11 1550  BP: 128/72  Pulse: 82  Temp: 97.3 F (36.3 C)  Resp: 16   Weight: 253 lb 8 oz (114.987 kg)  There is no height on file to calculate BMI.  Gen'l- obese elderly white man in no acute distress HEENT- C&S clear, EOMI Cor - 2+ radial pulse, RRR Pulm - normal respirations Abd- obese Neuro - A&O x 3, CN II-XII normal, gait - unassisted with good balance but he had difficulty getting up from seated position.        Assessment & Plan:

## 2011-05-16 NOTE — Assessment & Plan Note (Signed)
Lab Results  Component Value Date   HGB 12.1* 12/09/2010   No evidence of persistent anemia

## 2011-05-16 NOTE — Assessment & Plan Note (Signed)
Stable rate. 2 D echo reviewed - good EF.  Plan - per Dr. Patty Sermons

## 2011-05-16 NOTE — Assessment & Plan Note (Signed)
Lab Results  Component Value Date   HGBA1C 5.2 12/09/2010   Excellent control. No reports of hypoglycemia  Plan - continue present medication but will discuss changing from sulfonylurea to metformin at next visit.

## 2011-05-16 NOTE — Assessment & Plan Note (Signed)
Formal testing not done. He does admit to some memory problems. He does take aricept.  Plan - continue medication           Will need to do a baseline MMSE

## 2011-05-28 ENCOUNTER — Other Ambulatory Visit: Payer: Self-pay | Admitting: *Deleted

## 2011-05-28 MED ORDER — PANTOPRAZOLE SODIUM 40 MG PO TBEC: 40.0000 mg | DELAYED_RELEASE_TABLET | Freq: Every day | ORAL | Status: DC

## 2011-06-07 ENCOUNTER — Other Ambulatory Visit: Payer: Self-pay | Admitting: *Deleted

## 2011-06-07 DIAGNOSIS — N3281 Overactive bladder: Secondary | ICD-10-CM

## 2011-06-07 MED ORDER — PANTOPRAZOLE SODIUM 40 MG PO TBEC: 40.0000 mg | DELAYED_RELEASE_TABLET | Freq: Every day | ORAL | Status: DC

## 2011-06-07 MED ORDER — OXYBUTYNIN CHLORIDE ER 15 MG PO TB24: 15.0000 mg | ORAL_TABLET | Freq: Every day | ORAL | Status: DC

## 2011-06-07 NOTE — Telephone Encounter (Signed)
Refilled oxybuytin and protonix

## 2011-07-12 ENCOUNTER — Telehealth: Payer: Self-pay | Admitting: *Deleted

## 2011-07-14 NOTE — Telephone Encounter (Signed)
A user error has taken place: encounter opened in error, closed for administrative reasons.

## 2011-07-19 ENCOUNTER — Telehealth: Payer: Self-pay | Admitting: *Deleted

## 2011-07-19 NOTE — Telephone Encounter (Signed)
[name] called requesting that a liscense plate form be signed by Dr,. Norins. She will get the form and bring to our office. Spoke with [name] this am and will bring to office the form to be signed.  [name] 07/19/2011

## 2011-07-26 ENCOUNTER — Other Ambulatory Visit: Payer: Self-pay | Admitting: Internal Medicine

## 2011-07-26 DIAGNOSIS — G629 Polyneuropathy, unspecified: Secondary | ICD-10-CM

## 2011-07-26 NOTE — Telephone Encounter (Signed)
Wife requesting gabapentin prescription 300mg  once a dy --3 mth supply---[facility]--mail to:  [address] --[place]  [address]

## 2011-07-26 NOTE — Telephone Encounter (Signed)
OK for the gabapentin as requested. It is on the Med list but scratched off - I do not see where I stopped the medication.

## 2011-07-27 MED ORDER — GABAPENTIN 300 MG PO CAPS: 300.0000 mg | ORAL_CAPSULE | Freq: Every day | ORAL | Status: DC

## 2011-08-03 ENCOUNTER — Telehealth: Payer: Self-pay | Admitting: *Deleted

## 2011-08-03 NOTE — Telephone Encounter (Signed)
Form for handicapped license plate for car tag signed and mailed to patient in self stamped address he requested.

## 2011-08-19 ENCOUNTER — Telehealth: Payer: Self-pay | Admitting: Cardiology

## 2011-08-19 NOTE — Telephone Encounter (Signed)
oxybutin 15mg , protonix 40mg , glibizide 5mg , uses  [facility]

## 2011-08-21 NOTE — Telephone Encounter (Signed)
REFILL PRINTED WAITING FOR APPROVAL FROM DR [name]

## 2011-08-26 ENCOUNTER — Encounter: Payer: Self-pay | Admitting: Cardiology

## 2011-08-26 ENCOUNTER — Ambulatory Visit (INDEPENDENT_AMBULATORY_CARE_PROVIDER_SITE_OTHER): Payer: Medicare Other | Admitting: Cardiology

## 2011-08-26 VITALS — BP 138/78 | HR 80 | Ht 75.0 in | Wt 260.0 lb

## 2011-08-26 DIAGNOSIS — I4891 Unspecified atrial fibrillation: Secondary | ICD-10-CM

## 2011-08-26 DIAGNOSIS — I059 Rheumatic mitral valve disease, unspecified: Secondary | ICD-10-CM | POA: Diagnosis not present

## 2011-08-26 DIAGNOSIS — I340 Nonrheumatic mitral (valve) insufficiency: Secondary | ICD-10-CM

## 2011-08-26 DIAGNOSIS — D649 Anemia, unspecified: Secondary | ICD-10-CM

## 2011-08-26 NOTE — Assessment & Plan Note (Signed)
Patient has known moderate mitral regurgitation by echocardiogram on 04/21/11.  He has not been expressing any symptoms of worsening CHF.

## 2011-08-26 NOTE — Assessment & Plan Note (Signed)
The patient has not had any TIA symptoms from his atrial fibrillation.  He is having mild peripheral edema which is chronic.

## 2011-08-26 NOTE — Progress Notes (Signed)
Belenda Cruise Date of Birth:  Jul 08, 1927 [facility] [address] [place], [place]  [address] [phone]         Fax   [phone]  History of Present Illness: This pleasant 78 year old gentleman is seen for a scheduled followup office visit.  He has a past history of chronic established atrial fibrillation.  He is not on Coumadin because of recurrent GI bleeds.  He also has problems with exogenous obesity and diabetes.  Current Outpatient Prescriptions  Medication Sig Dispense Refill  . acetaminophen (TYLENOL) 650 MG CR tablet Take 650 mg by mouth as needed.        . COD LIVER OIL PO Take by mouth daily.       [name] donepezil (ARICEPT) 10 MG tablet Take 1 tablet (10 mg total) by mouth daily.  90 tablet  2  . ferrous sulfate 325 (65 FE) MG EC tablet Take 325 mg by mouth 1 day or 1 dose.       . folic acid (FOLVITE) 1 MG tablet Take 1 mg by mouth daily.        . furosemide (LASIX) 40 MG tablet Take 40 mg by mouth as needed.        . gabapentin (NEURONTIN) 300 MG capsule Take 1 capsule (300 mg total) by mouth at bedtime.  90 capsule  3  . glipiZIDE (GLUCOTROL) 5 MG tablet Take 1 tablet (5 mg total) by mouth daily.  90 tablet  3  . metoprolol tartrate (LOPRESSOR) 25 MG tablet Take 1 tablet (25 mg total) by mouth as directed. Take 1/2 tablet daily   90 tablet  2  . Multiple Vitamin (MULTIVITAMIN) tablet Take 1 tablet by mouth daily.        . nitroGLYCERIN (NITROSTAT) 0.4 MG SL tablet Place 0.4 mg under the tongue every 5 (five) minutes as needed.        [name] oxybutynin (DITROPAN XL) 15 MG 24 hr tablet Take 1 tablet (15 mg total) by mouth daily.  90 tablet  3  . pantoprazole (PROTONIX) 40 MG tablet Take 1 tablet (40 mg total) by mouth daily.  90 tablet  3  . Polyethylene Glycol 3350 (GLYCOLAX PO) Take by mouth as needed.        . tadalafil (CIALIS) 10 MG tablet Take 10 mg by mouth as needed.          Allergies  Allergen Reactions  . Cephalexin   . Furacin  (Nitrofurazone)   . Macrodantin   . Zestril (Lisinopril)     Patient Active Problem List  Diagnoses  . Atrial fibrillation  . Iron deficiency anemia due to chronic blood loss  . Diabetes mellitus  . Dementia  . Benign hypertensive heart disease without heart failure  . Heart murmur  . Mitral regurgitation    History  Smoking status  . Former Smoker  . Quit date: 08/28/1975  Smokeless tobacco  . Former User    History  Alcohol Use  . 3.5 oz/week  . 7 drink(s) per week    Family History  Problem Relation Age of Onset  . Kidney disease Mother   . Heart disease Mother   . Stroke Mother   . Cancer Father     prostate  . Diabetes Neg Hx   . COPD Neg Hx   . Mental illness Brother     Review of Systems: Constitutional: no fever chills diaphoresis or fatigue or change in weight.  Head and neck:  no hearing loss, no epistaxis, no photophobia or visual disturbance. Respiratory: No cough, shortness of breath or wheezing. Cardiovascular: No chest pain peripheral edema, palpitations. Gastrointestinal: No abdominal distention, no abdominal pain, no change in bowel habits hematochezia or melena. Genitourinary: No dysuria, no frequency, no urgency, no nocturia. Musculoskeletal:No arthralgias, no back pain, no gait disturbance or myalgias. Neurological: No dizziness, no headaches, no numbness, no seizures, no syncope, no weakness, no tremors. Hematologic: No lymphadenopathy, no easy bruising. Psychiatric: No confusion, no hallucinations, no sleep disturbance.    Physical Exam: Filed Vitals:   08/26/11 1525  BP: 138/78  Pulse: 80   the general appearance reveals a large gentleman in no acute distress.Pupils equal and reactive.   Extraocular Movements are full.  There is no scleral icterus.  The mouth and pharynx are normal.  The neck is supple.  The carotids reveal no bruits.  The jugular venous pressure is normal.  The thyroid is not enlarged.  There is no  lymphadenopathy.  The chest is clear to percussion and auscultation. There are no rales or rhonchi. Expansion of the chest is symmetrical.  The heart reveals a soft systolic ejection murmur at the base and apex.  The abdomen is obese extremities show 2+ peripheral edema  Assessment / Plan:  We are checking lab work today.  We're getting a CBC to be sure that he is not more anemic.  He has had some intermittent hematochezia.  He will continue same medication and be rechecked in 4 months for followup office visit EKG and CBC

## 2011-08-26 NOTE — Patient Instructions (Signed)
Your physician recommends that you continue on your current medications as directed. Please refer to the Current Medication list given to you today. Your physician wants you to follow-up in: 4 months You will receive a reminder letter in the mail two months in advance. If you don't receive a letter, please call our office to schedule the follow-up appointment.  

## 2011-08-26 NOTE — Assessment & Plan Note (Signed)
Patient has not been having any hypoglycemic symptoms.

## 2011-08-27 LAB — CBC WITH DIFFERENTIAL/PLATELET
Eosinophils Relative: 1.6 % (ref 0.0–5.0)
HCT: 37.4 % — ABNORMAL LOW (ref 39.0–52.0)
Hemoglobin: 12.1 g/dL — ABNORMAL LOW (ref 13.0–17.0)
Lymphs Abs: 0.7 10*3/uL (ref 0.7–4.0)
MCV: 90.8 fl (ref 78.0–100.0)
Monocytes Relative: 11.6 % (ref 3.0–12.0)
Neutro Abs: 3.5 10*3/uL (ref 1.4–7.7)
WBC: 4.9 10*3/uL (ref 4.5–10.5)

## 2011-08-27 LAB — BASIC METABOLIC PANEL
BUN: 11 mg/dL (ref 6–23)
CO2: 29 mEq/L (ref 19–32)
Calcium: 9.6 mg/dL (ref 8.4–10.5)
Creatinine, Ser: 0.8 mg/dL (ref 0.4–1.5)
Glucose, Bld: 74 mg/dL (ref 70–99)

## 2011-08-29 NOTE — Progress Notes (Signed)
Quick Note:  Please report to patient. The recent labs are stable. Continue same medication and careful diet. Hgb 12.1 okay for him. BS normal. ______

## 2011-08-31 ENCOUNTER — Telehealth: Payer: Self-pay | Admitting: *Deleted

## 2011-08-31 NOTE — Telephone Encounter (Signed)
Message copied by Burnell Blanks on Tue Aug 31, 2011  3:01 PM ------      Message from: [name]      Created: Sun Aug 29, 2011  8:33 AM       Please report to patient.  The recent labs are stable. Continue same medication and careful diet. Hgb 12.1 okay for him. BS normal.

## 2011-08-31 NOTE — Telephone Encounter (Signed)
Mailed copy of labs and left message to call if any questions Wrote on labs to watch potassium intake as well

## 2011-09-17 ENCOUNTER — Telehealth: Payer: Self-pay | Admitting: Cardiology

## 2011-09-17 NOTE — Telephone Encounter (Signed)
Advised wife since they are out of town to go to ED or Urgent Care.  Patient has history of anemia. Wife stated they would

## 2011-09-17 NOTE — Telephone Encounter (Signed)
New msg Pt's wife said he has had low blood count. She wants to talk to you

## 2011-09-19 DIAGNOSIS — K219 Gastro-esophageal reflux disease without esophagitis: Secondary | ICD-10-CM | POA: Diagnosis not present

## 2011-09-19 DIAGNOSIS — K921 Melena: Secondary | ICD-10-CM | POA: Diagnosis not present

## 2011-09-19 DIAGNOSIS — Z79899 Other long term (current) drug therapy: Secondary | ICD-10-CM | POA: Diagnosis not present

## 2011-09-19 DIAGNOSIS — E119 Type 2 diabetes mellitus without complications: Secondary | ICD-10-CM | POA: Diagnosis not present

## 2011-09-28 ENCOUNTER — Ambulatory Visit (INDEPENDENT_AMBULATORY_CARE_PROVIDER_SITE_OTHER): Payer: Medicare Other | Admitting: Internal Medicine

## 2011-09-28 ENCOUNTER — Other Ambulatory Visit (INDEPENDENT_AMBULATORY_CARE_PROVIDER_SITE_OTHER): Payer: Medicare Other

## 2011-09-28 ENCOUNTER — Encounter: Payer: Self-pay | Admitting: Internal Medicine

## 2011-09-28 ENCOUNTER — Telehealth: Payer: Self-pay | Admitting: Cardiology

## 2011-09-28 VITALS — BP 126/80 | HR 80 | Temp 98.2°F | Resp 16 | Wt 255.0 lb

## 2011-09-28 DIAGNOSIS — D500 Iron deficiency anemia secondary to blood loss (chronic): Secondary | ICD-10-CM

## 2011-09-28 LAB — CBC WITH DIFFERENTIAL/PLATELET
Basophils Absolute: 0 10*3/uL (ref 0.0–0.1)
Eosinophils Absolute: 0.1 10*3/uL (ref 0.0–0.7)
Lymphocytes Relative: 16.9 % (ref 12.0–46.0)
MCHC: 32.4 g/dL (ref 30.0–36.0)
Neutro Abs: 3.4 10*3/uL (ref 1.4–7.7)
Neutrophils Relative %: 69.1 % (ref 43.0–77.0)
Platelets: 183 10*3/uL (ref 150.0–400.0)
RDW: 14.7 % — ABNORMAL HIGH (ref 11.5–14.6)

## 2011-09-28 NOTE — Telephone Encounter (Signed)
Agree with advice given

## 2011-09-28 NOTE — Progress Notes (Signed)
Subjective:    Patient ID: [name], male    DOB: 11-06-1927, 78 y.o.   MRN: [id number]  HPI [name] is seen acutely for symptoms of weakness, feeling cold and pallor. He has a h/o GI bleeding but without identified source. His baseline Hgb is 12g. [place] Hgb was 10.5 g. He does take iron and molasses but he does report a two week h/o darker/black stools.  He denies chest pain, SOB, abdominal pain.    Past Medical History  Diagnosis Date  . Arrhythmia     a fib  . ED (erectile dysfunction)   . Memory disorder   . 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 Hematochezia     has intermittent problem  . Arthritis     DJD knee - left  . Restless leg     treats with heat  . Cancer     bladder cancer - BCG Dr. Darvin Neighbours  . Diabetes mellitus     NIDDM on oral medication  . Genital warts     recurrent. Not a problem  . GERD (gastroesophageal reflux disease)     treated with PPI  . Hypertension     mild  . Neuropathy     burning discomfort feet/legs  . Irritable bladder    Past Surgical History  Procedure Date  . Cardiovascular stress test 03/28/2003    EF 55%.  Normal cardiolite study  . Appendectomy 1946  . Tonsillectomy 1938  . Joint replacement 1990's    left TKR  . Knee arthroscopy 1980's    right knee   Family History  Problem Relation Age of Onset  . Kidney disease Mother   . Heart disease Mother   . Stroke Mother   . Cancer Father     prostate  . Diabetes Neg Hx   . COPD Neg Hx   . Mental illness Brother    History   Social History  . Marital Status: Married    Spouse Name: N/A    Number of Children: N/A  . Years of Education: N/A   Occupational History  . retired    Social History Main Topics  . Smoking status: Former Smoker    Quit date: 08/28/1975  . Smokeless tobacco: Former Neurosurgeon  . Alcohol Use: 3.5 oz/week    7 drink(s) per week  . Drug Use: No  . Sexually Active: Not Currently   Other Topics  Concern  . Not on file   Social History Narrative   10th grade. Married '49. 3 sons - '49, 51, 57; 1 dtr - '53. 8 grandchildren; 3 great-grands.  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 Lives with wife - independently. ACP- yes on CPR, OK for short term ventilation; no heroics including artificial hydration or nutrition.     Current Outpatient Prescriptions on File Prior to Visit  Medication Sig Dispense Refill  . acetaminophen (TYLENOL) 650 MG CR tablet Take 650 mg by mouth as needed.        . COD LIVER OIL PO Take by mouth daily.       [name] donepezil (ARICEPT) 10 MG tablet Take 1 tablet (10 mg total) by mouth daily.  90 tablet  2  . ferrous sulfate 325 (65 FE) MG EC tablet Take 325 mg by mouth 1 day or 1 dose.       [name]  folic acid (FOLVITE) 1 MG tablet Take 1 mg by mouth daily.        . furosemide (LASIX) 40 MG tablet Take 40 mg by mouth as needed.        . gabapentin (NEURONTIN) 300 MG capsule Take 1 capsule (300 mg total) by mouth at bedtime.  90 capsule  3  . glipiZIDE (GLUCOTROL) 5 MG tablet Take 1 tablet (5 mg total) by mouth daily.  90 tablet  3  . metoprolol tartrate (LOPRESSOR) 25 MG tablet Take 1 tablet (25 mg total) by mouth as directed. Take 1/2 tablet daily   90 tablet  2  . Multiple Vitamin (MULTIVITAMIN) tablet Take 1 tablet by mouth daily.        . nitroGLYCERIN (NITROSTAT) 0.4 MG SL tablet Place 0.4 mg under the tongue every 5 (five) minutes as needed.        [name] oxybutynin (DITROPAN XL) 15 MG 24 hr tablet Take 1 tablet (15 mg total) by mouth daily.  90 tablet  3  . pantoprazole (PROTONIX) 40 MG tablet Take 1 tablet (40 mg total) by mouth daily.  90 tablet  3  . Polyethylene Glycol 3350 (GLYCOLAX PO) Take by mouth as needed.        . tadalafil (CIALIS) 10 MG tablet Take 10 mg by mouth as needed.            Review of Systems System review is negative for any constitutional, cardiac, pulmonary, GI or neuro symptoms or  complaints other than as described in the HPI.     Objective:   Physical Exam Filed Vitals:   09/28/11 1645  BP: 126/80  Pulse: 80  Temp: 98.2 F (36.8 C)  Resp: 16   Wt Readings from Last 3 Encounters:  09/28/11 255 lb (115.667 kg)  08/26/11 260 lb (117.935 kg)  05/12/11 253 lb 8 oz (114.987 kg)   Gen'l- elderly overweight white man in no acute distress HEENT- C&S clear Neck - supple NOdes - negative Cor- 2+ radial pulse, IRIR rhythm, II/VI systolic mm at RSB PUlm - CTAP w/o increased WOB Abd- protuberant, BS+, no guarding or rebound. Neuro- A&O, Normal gait, no focal abnormalities.  Lab Results  Component Value Date   WBC 5.0 09/28/2011   HGB 10.6* 09/28/2011   HCT 32.7* 09/28/2011   MCV 88.2 09/28/2011   PLT 183.0 09/28/2011         Assessment & Plan:

## 2011-09-28 NOTE — Telephone Encounter (Signed)
Please return call to patient wife  at [phone] regarding patient care.

## 2011-09-28 NOTE — Telephone Encounter (Signed)
Patient stated he had hgb checked at beach recently and it was 10 but thinks it is dropping. Advised to call Dr Debby Bud since he is his PCP. Verbalized understanding

## 2011-09-29 NOTE — Assessment & Plan Note (Signed)
Patient presenting with symptoms suggestive of acute anemia. [place] he had a Hgb of 10.5 approximately 2 weeks ago by his report ( no lab available). He has not had SOB, C/P or syncope. He has had cold hands, pallor and dark stools.  Lab today with Hgb 10.2  Plan - watchful waiting with follow-up lab Monday, July 15th, sooner if more symptomatic

## 2011-10-04 ENCOUNTER — Other Ambulatory Visit (INDEPENDENT_AMBULATORY_CARE_PROVIDER_SITE_OTHER): Payer: Medicare Other

## 2011-10-04 DIAGNOSIS — D500 Iron deficiency anemia secondary to blood loss (chronic): Secondary | ICD-10-CM

## 2011-10-04 LAB — CBC WITH DIFFERENTIAL/PLATELET
Basophils Absolute: 0 10*3/uL (ref 0.0–0.1)
Hemoglobin: 10.3 g/dL — ABNORMAL LOW (ref 13.0–17.0)
Lymphocytes Relative: 13.6 % (ref 12.0–46.0)
Monocytes Relative: 12.7 % — ABNORMAL HIGH (ref 3.0–12.0)
Neutro Abs: 3.9 10*3/uL (ref 1.4–7.7)
RBC: 3.59 Mil/uL — ABNORMAL LOW (ref 4.22–5.81)
RDW: 15 % — ABNORMAL HIGH (ref 11.5–14.6)

## 2011-10-04 LAB — RETICULOCYTES: Retic Ct Pct: 1.4 % (ref 0.4–2.3)

## 2011-10-04 LAB — IBC PANEL: Saturation Ratios: 17.6 % — ABNORMAL LOW (ref 20.0–50.0)

## 2011-10-07 ENCOUNTER — Telehealth: Payer: Self-pay

## 2011-10-07 NOTE — Telephone Encounter (Signed)
Hemoglobin was [place] at 10.2 g

## 2011-10-07 NOTE — Telephone Encounter (Signed)
Left message on home # of normal lab value Hgb was 10.2

## 2011-10-07 NOTE — Telephone Encounter (Signed)
Caller is requesting results of recent labs, please advise

## 2011-10-21 DIAGNOSIS — H2510 Age-related nuclear cataract, unspecified eye: Secondary | ICD-10-CM | POA: Diagnosis not present

## 2011-10-21 DIAGNOSIS — H353190 Nonexudative age-related macular degeneration, unspecified eye, stage unspecified: Secondary | ICD-10-CM | POA: Diagnosis not present

## 2011-10-22 ENCOUNTER — Other Ambulatory Visit: Payer: Self-pay | Admitting: *Deleted

## 2011-10-22 MED ORDER — METOPROLOL TARTRATE 25 MG PO TABS: 25.0000 mg | ORAL_TABLET | ORAL | Status: DC

## 2011-10-25 ENCOUNTER — Other Ambulatory Visit: Payer: Self-pay | Admitting: *Deleted

## 2011-10-25 MED ORDER — METOPROLOL TARTRATE 25 MG PO TABS: ORAL_TABLET | ORAL | Status: DC

## 2011-10-25 NOTE — Telephone Encounter (Signed)
Refilled metoprolol 

## 2011-10-25 NOTE — Telephone Encounter (Signed)
Fax Received. Refill Completed. [name] (R.M.A)   

## 2011-10-28 ENCOUNTER — Other Ambulatory Visit: Payer: Self-pay | Admitting: *Deleted

## 2011-12-01 ENCOUNTER — Other Ambulatory Visit: Payer: Self-pay | Admitting: *Deleted

## 2011-12-01 DIAGNOSIS — R413 Other amnesia: Secondary | ICD-10-CM

## 2011-12-01 MED ORDER — DONEPEZIL HCL 10 MG PO TABS: 10.0000 mg | ORAL_TABLET | Freq: Every day | ORAL | Status: DC

## 2011-12-02 DIAGNOSIS — R319 Hematuria, unspecified: Secondary | ICD-10-CM | POA: Diagnosis not present

## 2011-12-02 DIAGNOSIS — Z8551 Personal history of malignant neoplasm of bladder: Secondary | ICD-10-CM | POA: Diagnosis not present

## 2011-12-08 ENCOUNTER — Other Ambulatory Visit: Payer: Self-pay | Admitting: *Deleted

## 2011-12-08 DIAGNOSIS — E119 Type 2 diabetes mellitus without complications: Secondary | ICD-10-CM

## 2011-12-08 MED ORDER — FUROSEMIDE 40 MG PO TABS: 40.0000 mg | ORAL_TABLET | ORAL | Status: DC | PRN

## 2011-12-08 MED ORDER — GLIPIZIDE 5 MG PO TABS: 5.0000 mg | ORAL_TABLET | Freq: Every day | ORAL | Status: DC

## 2011-12-08 NOTE — Telephone Encounter (Signed)
Refilled furosemide and glipizide

## 2011-12-14 ENCOUNTER — Other Ambulatory Visit (INDEPENDENT_AMBULATORY_CARE_PROVIDER_SITE_OTHER): Payer: Medicare Other

## 2011-12-14 ENCOUNTER — Encounter: Payer: Self-pay | Admitting: Internal Medicine

## 2011-12-14 ENCOUNTER — Ambulatory Visit (INDEPENDENT_AMBULATORY_CARE_PROVIDER_SITE_OTHER): Payer: Medicare Other | Admitting: Internal Medicine

## 2011-12-14 VITALS — BP 124/60 | HR 76 | Resp 14 | Ht 75.0 in | Wt 250.0 lb

## 2011-12-14 DIAGNOSIS — D500 Iron deficiency anemia secondary to blood loss (chronic): Secondary | ICD-10-CM

## 2011-12-14 DIAGNOSIS — Z23 Encounter for immunization: Secondary | ICD-10-CM

## 2011-12-14 DIAGNOSIS — F0390 Unspecified dementia without behavioral disturbance: Secondary | ICD-10-CM | POA: Diagnosis not present

## 2011-12-14 DIAGNOSIS — R413 Other amnesia: Secondary | ICD-10-CM | POA: Diagnosis not present

## 2011-12-14 DIAGNOSIS — E119 Type 2 diabetes mellitus without complications: Secondary | ICD-10-CM

## 2011-12-14 LAB — CBC WITH DIFFERENTIAL/PLATELET
Basophils Relative: 0.6 % (ref 0.0–3.0)
Eosinophils Absolute: 0.1 10*3/uL (ref 0.0–0.7)
Eosinophils Relative: 1.8 % (ref 0.0–5.0)
HCT: 37.8 % — ABNORMAL LOW (ref 39.0–52.0)
Hemoglobin: 12.1 g/dL — ABNORMAL LOW (ref 13.0–17.0)
MCHC: 31.9 g/dL (ref 30.0–36.0)
MCV: 86.5 fl (ref 78.0–100.0)
Monocytes Absolute: 0.7 10*3/uL (ref 0.1–1.0)
Neutro Abs: 3.4 10*3/uL (ref 1.4–7.7)
RBC: 4.37 Mil/uL (ref 4.22–5.81)
WBC: 4.9 10*3/uL (ref 4.5–10.5)

## 2011-12-14 MED ORDER — DONEPEZIL HCL 23 MG PO TABS: 23.0000 mg | ORAL_TABLET | Freq: Every day | ORAL | Status: DC

## 2011-12-14 MED ORDER — FERROUS SULFATE 325 (65 FE) MG PO TBEC: 325.0000 mg | DELAYED_RELEASE_TABLET | Freq: Two times a day (BID) | ORAL | Status: DC

## 2011-12-14 MED ORDER — FUROSEMIDE 40 MG PO TABS: 20.0000 mg | ORAL_TABLET | ORAL | Status: DC | PRN

## 2011-12-14 NOTE — Assessment & Plan Note (Signed)
Patient, and his wife, reports some progression in memory loss.  Plan  Increase aricept to 23 mg daily - 30 day supply  If this isn't tolerated or effective will drop back to 10 mg daily and add Namenda.

## 2011-12-14 NOTE — Assessment & Plan Note (Signed)
Has been stable. Last Hgb 10.4 in July '13  Plan - follow up lab  Continue iron

## 2011-12-14 NOTE — Progress Notes (Signed)
Subjective:    Patient ID: [name], male    DOB: Apr 06, 1927, 78 y.o.   MRN: [id number]  HPI [name] presents to discuss problems with short term memory. He is on aricept 10 mg already. Per his wife's report, present at today's visit, he seems to have more trouble with memory. He does remain iADLs, needs help with medication regimen.  He is due for follow-up lab: Hgb and A1C  Past Medical History  Diagnosis Date  . Arrhythmia     a fib  . ED (erectile dysfunction)   . Memory disorder   . 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 Hematochezia     has intermittent problem  . Arthritis     DJD knee - left  . Restless leg     treats with heat  . Cancer     bladder cancer - BCG Dr. Darvin Neighbours  . Diabetes mellitus     NIDDM on oral medication  . Genital warts     recurrent. Not a problem  . GERD (gastroesophageal reflux disease)     treated with PPI  . Hypertension     mild  . Neuropathy     burning discomfort feet/legs  . Irritable bladder    Past Surgical History  Procedure Date  . Cardiovascular stress test 03/28/2003    EF 55%.  Normal cardiolite study  . Appendectomy 1946  . Tonsillectomy 1938  . Joint replacement 1990's    left TKR  . Knee arthroscopy 1980's    right knee   Family History  Problem Relation Age of Onset  . Kidney disease Mother   . Heart disease Mother   . Stroke Mother   . Cancer Father     prostate  . Diabetes Neg Hx   . COPD Neg Hx   . Mental illness Brother    History   Social History  . Marital Status: Married    Spouse Name: N/A    Number of Children: N/A  . Years of Education: N/A   Occupational History  . retired    Social History Main Topics  . Smoking status: Former Smoker    Quit date: 08/28/1975  . Smokeless tobacco: Former Neurosurgeon  . Alcohol Use: 3.5 oz/week    7 drink(s) per week  . Drug Use: No  . Sexually Active: Not Currently   Other Topics Concern  . Not on file   Social History Narrative   10th grade. Married '49. 3 sons - '49, 51, 57; 1 dtr - '53. 8 grandchildren; 3 great-grands.  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 Lives with wife - independently. ACP- yes on CPR, OK for short term ventilation; no heroics including artificial hydration or nutrition.     Current Outpatient Prescriptions on File Prior to Visit  Medication Sig Dispense Refill  . acetaminophen (TYLENOL) 650 MG CR tablet Take 650 mg by mouth as needed.        . COD LIVER OIL PO Take by mouth daily.       . folic acid (FOLVITE) 1 MG tablet Take 1 mg by mouth daily.        [name] gabapentin (NEURONTIN) 300 MG capsule Take 1 capsule (300 mg total) by mouth at bedtime.  90 capsule  3  . glipiZIDE (GLUCOTROL) 5 MG tablet Take 1 tablet (5 mg total) by mouth daily.  90 tablet  3  . metoprolol tartrate (LOPRESSOR) 25 MG tablet Taking 0.5 Tablet  90 tablet  3  . Multiple Vitamin (MULTIVITAMIN) tablet Take 1 tablet by mouth daily.        . nitroGLYCERIN (NITROSTAT) 0.4 MG SL tablet Place 0.4 mg under the tongue every 5 (five) minutes as needed.        [name] oxybutynin (DITROPAN XL) 15 MG 24 hr tablet Take 1 tablet (15 mg total) by mouth daily.  90 tablet  3  . pantoprazole (PROTONIX) 40 MG tablet Take 1 tablet (40 mg total) by mouth daily.  90 tablet  3  . Polyethylene Glycol 3350 (GLYCOLAX PO) Take by mouth as needed.        [name] DISCONTD: donepezil (ARICEPT) 10 MG tablet Take 1 tablet (10 mg total) by mouth daily.  90 tablet  3  . DISCONTD: ferrous sulfate 325 (65 FE) MG EC tablet Take 325 mg by mouth 1 day or 1 dose.       [name] DISCONTD: furosemide (LASIX) 40 MG tablet Take 1 tablet (40 mg total) by mouth as needed.  90 tablet  3      Review of Systems System review is negative for any constitutional, cardiac, pulmonary, GI or neuro symptoms or complaints other than as described in the HPI.     Objective:   Physical Exam  Filed Vitals:   12/14/11 1310  BP: 124/60    Pulse: 76  Resp: 14   Wt Readings from Last 3 Encounters:  12/14/11 250 lb (113.399 kg)  09/28/11 255 lb (115.667 kg)  08/26/11 260 lb (117.935 kg)   Gen'l- overweight elderly white man in no distress HEENT - C&S clear Cor - 2+ radial pulse, RRR Pulm - good breath sounds Neuro - A&O, ambulates with a cane. Formal memory testing deferred.      Assessment & Plan:

## 2011-12-14 NOTE — Patient Instructions (Addendum)
Memory loss - plan is to increase Aricept to 23 mg once a day. If this is not tolerated will then resume 10 mg aricept and add Namenda.  Diabetes - will check A1C today.  Anemia - continue iron replacement  Will check lab today.  Flu shot today.

## 2011-12-14 NOTE — Assessment & Plan Note (Signed)
Has been stable w/o hypoglycemia episodes  Plan - A1C today with recommendations to follow

## 2011-12-16 ENCOUNTER — Other Ambulatory Visit: Payer: Self-pay | Admitting: *Deleted

## 2011-12-17 MED ORDER — FUROSEMIDE 40 MG PO TABS: 20.0000 mg | ORAL_TABLET | Freq: Every day | ORAL | Status: DC | PRN

## 2011-12-20 ENCOUNTER — Encounter: Payer: Self-pay | Admitting: Internal Medicine

## 2012-01-03 ENCOUNTER — Ambulatory Visit (INDEPENDENT_AMBULATORY_CARE_PROVIDER_SITE_OTHER): Payer: Medicare Other | Admitting: Cardiology

## 2012-01-03 ENCOUNTER — Encounter: Payer: Self-pay | Admitting: Cardiology

## 2012-01-03 VITALS — BP 126/60 | HR 59 | Ht 75.5 in | Wt 251.8 lb

## 2012-01-03 DIAGNOSIS — I4891 Unspecified atrial fibrillation: Secondary | ICD-10-CM | POA: Diagnosis not present

## 2012-01-03 DIAGNOSIS — D509 Iron deficiency anemia, unspecified: Secondary | ICD-10-CM

## 2012-01-03 DIAGNOSIS — I119 Hypertensive heart disease without heart failure: Secondary | ICD-10-CM

## 2012-01-03 DIAGNOSIS — E875 Hyperkalemia: Secondary | ICD-10-CM

## 2012-01-03 DIAGNOSIS — E119 Type 2 diabetes mellitus without complications: Secondary | ICD-10-CM

## 2012-01-03 NOTE — Assessment & Plan Note (Signed)
The patient denies any hypoglycemic episodes 

## 2012-01-03 NOTE — Progress Notes (Signed)
[name] Date of Birth:  1928/03/13 [facility] [address] [place], [place]  [address] [phone]         Fax   [phone]  History of Present Illness: This pleasant 78 year old gentleman is seen for a scheduled followup office visit. He has a past history of chronic established atrial fibrillation. He is not on Coumadin because of recurrent GI bleeds. He also has problems with exogenous obesity and diabetes.  Since last visit he has been doing well with no new cardiac symptoms.  His exertional dyspnea is unchanged.  Current Outpatient Prescriptions  Medication Sig Dispense Refill  . acetaminophen (TYLENOL) 650 MG CR tablet Take 650 mg by mouth as needed.        . COD LIVER OIL PO Take by mouth daily.       [name] donepezil (ARICEPT) 23 MG TABS tablet Take 1 tablet (23 mg total) by mouth daily.  30 tablet  1  . ferrous sulfate 325 (65 FE) MG EC tablet Take 1 tablet (325 mg total) by mouth 2 (two) times daily.      . folic acid (FOLVITE) 1 MG tablet Take 1 mg by mouth daily.        . furosemide (LASIX) 40 MG tablet Take 0.5 tablets (20 mg total) by mouth daily as needed.  90 tablet  3  . gabapentin (NEURONTIN) 300 MG capsule Take 1 capsule (300 mg total) by mouth at bedtime.  90 capsule  3  . glipiZIDE (GLUCOTROL) 5 MG tablet Take 1 tablet (5 mg total) by mouth daily.  90 tablet  3  . metoprolol tartrate (LOPRESSOR) 25 MG tablet Taking 0.5 Tablet  90 tablet  3  . Multiple Vitamin (MULTIVITAMIN) tablet Take 1 tablet by mouth daily.        . nitroGLYCERIN (NITROSTAT) 0.4 MG SL tablet Place 0.4 mg under the tongue every 5 (five) minutes as needed.        [name] oxybutynin (DITROPAN XL) 15 MG 24 hr tablet Take 1 tablet (15 mg total) by mouth daily.  90 tablet  3  . pantoprazole (PROTONIX) 40 MG tablet Take 1 tablet (40 mg total) by mouth daily.  90 tablet  3  . Polyethylene Glycol 3350 (GLYCOLAX PO) Take by mouth as needed.          Allergies  Allergen Reactions  .  Cephalexin   . Furacin (Nitrofurazone)   . Macrodantin   . Zestril (Lisinopril)     Patient Active Problem List  Diagnosis  . Atrial fibrillation  . Iron deficiency anemia due to chronic blood loss  . Diabetes mellitus  . Dementia  . Benign hypertensive heart disease without heart failure  . Heart murmur  . Mitral regurgitation    History  Smoking status  . Former Smoker  . Quit date: 08/28/1975  Smokeless tobacco  . Former User    History  Alcohol Use  . 3.5 oz/week  . 7 drink(s) per week    Family History  Problem Relation Age of Onset  . Kidney disease Mother   . Heart disease Mother   . Stroke Mother   . Cancer Father     prostate  . Diabetes Neg Hx   . COPD Neg Hx   . Mental illness Brother     Review of Systems: Constitutional: no fever chills diaphoresis or fatigue or change in weight.  Head and neck: no hearing loss, no epistaxis, no photophobia or visual disturbance.  Respiratory: No cough, shortness of breath or wheezing. Cardiovascular: No chest pain peripheral edema, palpitations. Gastrointestinal: No abdominal distention, no abdominal pain, no change in bowel habits hematochezia or melena. Genitourinary: No dysuria, no frequency, no urgency, no nocturia. Musculoskeletal:No arthralgias, no back pain, no gait disturbance or myalgias. Neurological: No dizziness, no headaches, no numbness, no seizures, no syncope, no weakness, no tremors. Hematologic: No lymphadenopathy, no easy bruising. Psychiatric: No confusion, no hallucinations, no sleep disturbance.    Physical Exam: Filed Vitals:   01/03/12 1605  BP: 126/60  Pulse: 59   the general appearance reveals a large gentleman in no distress.The head and neck exam reveals pupils equal and reactive.  Extraocular movements are full.  There is no scleral icterus.  The mouth and pharynx are normal.  The neck is supple.  The carotids reveal no bruits.  The jugular venous pressure is normal.  The  thyroid  is not enlarged.  There is no lymphadenopathy.  The chest is clear to percussion and auscultation.  There are no rales or rhonchi.  Expansion of the chest is symmetrical.  The precordium is quiet.  The rhythm is irregularly irregular in atrial fibrillation.  The first heart sound is normal.  The second heart sound is physiologically split.  There is no murmur gallop rub or click.  There is no abnormal lift or heave.  The abdomen is soft and nontender.  The bowel sounds are normal.  The liver and spleen are not enlarged.  There are no abdominal masses.  There are no abdominal bruits.  Extremities reveal good pedal pulses.  There is no phlebitis and there is trace ankle edema. There is no cyanosis or clubbing.  Strength is normal and symmetrical in all extremities.  There is no lateralizing weakness.  There are no sensory deficits.  The skin is warm and dry.  There is no rash.     Assessment / Plan: Continue on same medication.  We are checking a CBC and basal metabolic panel today.  He has had a past history of hypercholesterolemia and he is avoiding excessive bananas and fruit. Recheck in 4 months for followup office visit EKG CBC and basal metabolic panel

## 2012-01-03 NOTE — Patient Instructions (Addendum)
Will obtain labs today and call you with the results (cbc/bmet)  Your physician recommends that you continue on your current medications as directed. Please refer to the Current Medication list given to you today.  Your physician recommends that you schedule a follow-up appointment in: 4 months with labs (bmet/cbc) with ekg

## 2012-01-03 NOTE — Assessment & Plan Note (Signed)
Because of recurrent GI bleeds the patient is not on anticoagulation.  He has not been experiencing any TIA symptoms.  He denies chest pain or worsening dyspnea

## 2012-01-03 NOTE — Assessment & Plan Note (Signed)
Blood pressure was remaining stable on current therapy.  No dizziness or syncope.  He has trace peripheral edema probably secondary to venous insufficiency.

## 2012-01-04 LAB — CBC WITH DIFFERENTIAL/PLATELET
Basophils Absolute: 0 10*3/uL (ref 0.0–0.1)
Eosinophils Absolute: 0.1 10*3/uL (ref 0.0–0.7)
HCT: 36.3 % — ABNORMAL LOW (ref 39.0–52.0)
Hemoglobin: 11.8 g/dL — ABNORMAL LOW (ref 13.0–17.0)
Lymphocytes Relative: 13.4 % (ref 12.0–46.0)
Lymphs Abs: 0.6 10*3/uL — ABNORMAL LOW (ref 0.7–4.0)
MCHC: 32.4 g/dL (ref 30.0–36.0)
Monocytes Relative: 10.4 % (ref 3.0–12.0)
Neutro Abs: 3.2 10*3/uL (ref 1.4–7.7)
Platelets: 139 10*3/uL — ABNORMAL LOW (ref 150.0–400.0)
RDW: 16.5 % — ABNORMAL HIGH (ref 11.5–14.6)

## 2012-01-04 LAB — BASIC METABOLIC PANEL
BUN: 9 mg/dL (ref 6–23)
CO2: 29 mEq/L (ref 19–32)
Calcium: 8.9 mg/dL (ref 8.4–10.5)
GFR: 87.97 mL/min (ref >60.00)
Glucose, Bld: 84 mg/dL (ref 70–99)

## 2012-01-05 ENCOUNTER — Telehealth: Payer: Self-pay | Admitting: *Deleted

## 2012-01-05 NOTE — Progress Notes (Signed)
Quick Note:  Please report to patient. The recent labs are stable. Continue same medication and careful diet. Hbg 11.8 okay for him. ______

## 2012-01-05 NOTE — Telephone Encounter (Signed)
Mailed copy of labs and left message to call if any questions  

## 2012-01-05 NOTE — Telephone Encounter (Signed)
Message copied by Burnell Blanks on Wed Jan 05, 2012  5:44 PM ------      Message from: [name]      Created: Wed Jan 05, 2012 12:24 PM       Please report to patient.  The recent labs are stable. Continue same medication and careful diet. Hbg 11.8 okay for him.

## 2012-01-12 ENCOUNTER — Telehealth: Payer: Self-pay | Admitting: *Deleted

## 2012-01-12 MED ORDER — MEMANTINE HCL 28 X 5 MG & 21 X 10 MG PO TABS: ORAL_TABLET | ORAL | Status: DC

## 2012-01-12 MED ORDER — DONEPEZIL HCL 10 MG PO TABS: 10.0000 mg | ORAL_TABLET | Freq: Every evening | ORAL | Status: DC | PRN

## 2012-01-12 NOTE — Telephone Encounter (Signed)
Wife of patient called to let you know Aricept increase dose has not changed anything of memory loss. request to try the other medication you had suggested at their last visit. For memory.  [facility] [address]. CB#[phone]

## 2012-01-12 NOTE — Telephone Encounter (Signed)
Resume aricept 10 mg daily Start Namenda -titration pak Rx's sent in

## 2012-01-13 NOTE — Telephone Encounter (Signed)
Spoke with [name] of medication management of aricept and starting Namenda. Rx sent to [facility]

## 2012-01-31 ENCOUNTER — Other Ambulatory Visit: Payer: Self-pay | Admitting: *Deleted

## 2012-02-01 ENCOUNTER — Telehealth: Payer: Self-pay | Admitting: *Deleted

## 2012-02-01 NOTE — Telephone Encounter (Signed)
He should have completed titration pak - had been instructed to call before completing pak to let me know if tolerated. OK for Namenda 10 mg bid, #60, refill prn.

## 2012-02-01 NOTE — Telephone Encounter (Signed)
Patient wife , [name], called for refill on Namenda x one month. Has been all right  States not better but Candiss Norse is helping some. [name] states he is taking Aricept also. Please advise on refill for Namenda. medication list is titraiting pack only.

## 2012-02-03 ENCOUNTER — Telehealth: Payer: Self-pay | Admitting: *Deleted

## 2012-02-03 MED ORDER — MEMANTINE HCL 10 MG PO TABS: 10.0000 mg | ORAL_TABLET | Freq: Two times a day (BID) | ORAL | Status: DC

## 2012-02-03 NOTE — Telephone Encounter (Signed)
Patient wife notified of medication, Nam[facility]. States she will let Dr. Debby Bud know how this is working when ready to get refills. Patient is continuing with Aricept also.

## 2012-02-21 ENCOUNTER — Other Ambulatory Visit: Payer: Self-pay | Admitting: *Deleted

## 2012-02-21 MED ORDER — FUROSEMIDE 40 MG PO TABS: 20.0000 mg | ORAL_TABLET | Freq: Every day | ORAL | Status: DC | PRN

## 2012-02-23 ENCOUNTER — Encounter: Payer: Self-pay | Admitting: Internal Medicine

## 2012-02-23 ENCOUNTER — Telehealth: Payer: Self-pay | Admitting: Internal Medicine

## 2012-02-23 ENCOUNTER — Telehealth: Payer: Self-pay | Admitting: Cardiology

## 2012-02-23 ENCOUNTER — Ambulatory Visit (INDEPENDENT_AMBULATORY_CARE_PROVIDER_SITE_OTHER): Payer: Medicare Other | Admitting: Internal Medicine

## 2012-02-23 VITALS — BP 118/78 | HR 80 | Temp 98.8°F | Resp 16 | Wt 265.0 lb

## 2012-02-23 DIAGNOSIS — J019 Acute sinusitis, unspecified: Secondary | ICD-10-CM | POA: Diagnosis not present

## 2012-02-23 MED ORDER — DOXYCYCLINE HYCLATE 100 MG PO TABS: 100.0000 mg | ORAL_TABLET | Freq: Two times a day (BID) | ORAL | Status: DC

## 2012-02-23 NOTE — Telephone Encounter (Signed)
Called back and spoke with wife, patient seeing Dr Arthur Holms

## 2012-02-23 NOTE — Telephone Encounter (Signed)
plz return call to pt [phone] regarding pt cough.

## 2012-02-23 NOTE — Patient Instructions (Addendum)
Use over-the-counter  "cold" medicines  such as"Afrin" nasal spray for nasal congestion as directed instead. Use" Delsym" or" Robitussin" cough syrup varietis for cough.  You can use plain "Tylenol" or "Advil' for fever, chills and achyness.   

## 2012-02-23 NOTE — Progress Notes (Signed)
Patient ID: [name], male   DOB: 1928-03-01, 78 y.o.   MRN: [id number]  Subjective:    Patient ID: [name], male    DOB: 16-Oct-1927, 78 y.o.   MRN: [id number]  Sinusitis This is a new problem. The current episode started in the past 7 days. The problem has been gradually worsening since onset. The maximum temperature recorded prior to his arrival was 100 - 100.9 F. The pain is mild. Associated symptoms include chills and coughing. Past treatments include oral decongestants. The treatment provided mild relief.  Cough This is a new problem. The current episode started in the past 7 days. The cough is non-productive. Associated symptoms include chills.   [name] presents to discuss problems with short term memory. He is on aricept 10 mg already. Per his wife's report, present at today's visit, he seems to have more trouble with memory. He does remain iADLs, needs help with medication regimen.  He is due for follow-up lab: Hgb and A1C  Past Medical History  Diagnosis Date  . Arrhythmia     a fib  . ED (erectile dysfunction)   . Memory disorder   . 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 Hematochezia     has intermittent problem  . Arthritis     DJD knee - left  . Restless leg     treats with heat  . Cancer     bladder cancer - BCG Dr. Darvin Neighbours  . Diabetes mellitus     NIDDM on oral medication  . Genital warts     recurrent. Not a problem  . GERD (gastroesophageal reflux disease)     treated with PPI  . Hypertension     mild  . Neuropathy     burning discomfort feet/legs  . Irritable bladder    Past Surgical History  Procedure Date  . Cardiovascular stress test 03/28/2003    EF 55%.  Normal cardiolite study  . Appendectomy 1946  . Tonsillectomy 1938  . Joint replacement 1990's    left TKR  . Knee arthroscopy 1980's    right knee   Family History  Problem Relation Age of Onset  . Kidney disease Mother   . Heart disease Mother   . Stroke Mother    . Cancer Father     prostate  . Diabetes Neg Hx   . COPD Neg Hx   . Mental illness Brother    History   Social History  . Marital Status: Married    Spouse Name: N/A    Number of Children: N/A  . Years of Education: N/A   Occupational History  . retired    Social History Main Topics  . Smoking status: Former Smoker    Quit date: 08/28/1975  . Smokeless tobacco: Former Neurosurgeon  . Alcohol Use: 3.5 oz/week    7 drink(s) per week  . Drug Use: No  . Sexually Active: Not Currently   Other Topics Concern  . Not on file   Social History Narrative   10th grade. Married '49. 3 sons - '49, 51, 57; 1 dtr - '53. 8 grandchildren; 3 great-grands.  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 Lives with wife - independently. ACP- yes on CPR, OK for short term ventilation; no heroics including artificial hydration or nutrition.     Current Outpatient Prescriptions on File Prior to Visit  Medication Sig  Dispense Refill  . acetaminophen (TYLENOL) 650 MG CR tablet Take 650 mg by mouth as needed.        . COD LIVER OIL PO Take by mouth daily.       [name] donepezil (ARICEPT) 10 MG tablet Take 1 tablet (10 mg total) by mouth at bedtime as needed.  30 tablet  11  . ferrous sulfate 325 (65 FE) MG EC tablet Take 1 tablet (325 mg total) by mouth 2 (two) times daily.      . fexofenadine (ALLEGRA) 180 MG tablet Take 180 mg by mouth daily.      . folic acid (FOLVITE) 1 MG tablet Take 1 mg by mouth daily.        . furosemide (LASIX) 40 MG tablet Take 0.5 tablets (20 mg total) by mouth daily as needed.  90 tablet  3  . gabapentin (NEURONTIN) 300 MG capsule Take 1 capsule (300 mg total) by mouth at bedtime.  90 capsule  3  . glipiZIDE (GLUCOTROL) 5 MG tablet Take 1 tablet (5 mg total) by mouth daily.  90 tablet  3  . memantine (NAMENDA TITRATION PAK) tablet pack 5 mg/day for =1 week; 5 mg twice daily for =1 week; 15 mg/day given in 5 mg and 10 mg separated  doses for =1 week; then 10 mg twice daily  49 tablet  12  . memantine (NAMENDA) 10 MG tablet Take 1 tablet (10 mg total) by mouth 2 (two) times daily.  60 tablet  1  . metoprolol tartrate (LOPRESSOR) 25 MG tablet Taking 0.5 Tablet  90 tablet  3  . Multiple Vitamin (MULTIVITAMIN) tablet Take 1 tablet by mouth daily.        . nitroGLYCERIN (NITROSTAT) 0.4 MG SL tablet Place 0.4 mg under the tongue every 5 (five) minutes as needed.        [name] oxybutynin (DITROPAN XL) 15 MG 24 hr tablet Take 1 tablet (15 mg total) by mouth daily.  90 tablet  3  . pantoprazole (PROTONIX) 40 MG tablet Take 1 tablet (40 mg total) by mouth daily.  90 tablet  3  . Polyethylene Glycol 3350 (GLYCOLAX PO) Take by mouth as needed.            Review of Systems  Constitutional: Positive for chills.  Respiratory: Positive for cough.    System review is negative for any constitutional, cardiac, pulmonary, GI or neuro symptoms or complaints other than as described in the HPI.     Objective:   Physical Exam  Filed Vitals:   02/23/12 1646  BP: 118/78  Pulse: 80  Temp: 98.8 F (37.1 C)  Resp: 16   Wt Readings from Last 3 Encounters:  02/23/12 265 lb (120.203 kg)  01/03/12 251 lb 12.8 oz (114.216 kg)  12/14/11 250 lb (113.399 kg)   Gen'l- overweight elderly white man in no distress HEENT - eryth throat Cor - 2+ radial pulse, RRR Pulm - good breath sounds, CTA Neuro - A&O, ambulates with a cane. Formal memory testing deferred.      Assessment & Plan:

## 2012-02-23 NOTE — Assessment & Plan Note (Signed)
Doxy x 10 d 

## 2012-02-23 NOTE — Telephone Encounter (Signed)
Patient Information:  Caller Name: Kathie Rhodes  Phone: [phone]  Patient: [name]  Gender: Male  DOB: 04/11/1928  Age: 78 Years  PCP: Illene Regulus (Adults only)   Symptoms  Reason For Call & Symptoms: Wife calling, has had a nose bleed intermittently x 24 hours.  He has had alot of sinus drainage  Reviewed Health History In EMR: Yes  Reviewed Medications In EMR: Yes  Reviewed Allergies In EMR: Yes  Reviewed Surgeries / Procedures: Yes  Date of Onset of Symptoms: 02/22/2012  Treatments Tried: pressure to the nose  Treatments Tried Worked: No  Guideline(s) Used:  Nosebleed  Disposition Per Guideline:   See Today in Office  Reason For Disposition Reached:   Bleeding recurs 3 or more times in 24 hours despite direct pressure  Advice Given:  N/A  Office Follow Up:  Does the office need to follow up with this patient?: No  Instructions For The Office: N/A  Appointment Scheduled:  02/23/2012 16:15:16 Appointment Scheduled Provider:  Sonda Primes (Adults only)  RN Note:  Wanting antibiotics for the sinus drainage.

## 2012-02-27 ENCOUNTER — Encounter (HOSPITAL_COMMUNITY): Payer: Self-pay | Admitting: *Deleted

## 2012-02-27 ENCOUNTER — Telehealth: Payer: Self-pay | Admitting: Physician Assistant

## 2012-02-27 ENCOUNTER — Emergency Department (HOSPITAL_COMMUNITY)
Admission: EM | Admit: 2012-02-27 | Discharge: 2012-02-27 | Disposition: A | Discharge status: Home / Self Care | Payer: Medicare Other | Attending: Emergency Medicine | Admitting: Emergency Medicine

## 2012-02-27 DIAGNOSIS — D509 Iron deficiency anemia, unspecified: Secondary | ICD-10-CM | POA: Insufficient documentation

## 2012-02-27 DIAGNOSIS — G4700 Insomnia, unspecified: Secondary | ICD-10-CM | POA: Insufficient documentation

## 2012-02-27 DIAGNOSIS — K219 Gastro-esophageal reflux disease without esophagitis: Secondary | ICD-10-CM | POA: Insufficient documentation

## 2012-02-27 DIAGNOSIS — I499 Cardiac arrhythmia, unspecified: Secondary | ICD-10-CM | POA: Insufficient documentation

## 2012-02-27 DIAGNOSIS — C679 Malignant neoplasm of bladder, unspecified: Secondary | ICD-10-CM | POA: Diagnosis not present

## 2012-02-27 DIAGNOSIS — E1149 Type 2 diabetes mellitus with other diabetic neurological complication: Secondary | ICD-10-CM | POA: Insufficient documentation

## 2012-02-27 DIAGNOSIS — Z87448 Personal history of other diseases of urinary system: Secondary | ICD-10-CM | POA: Diagnosis not present

## 2012-02-27 DIAGNOSIS — I10 Essential (primary) hypertension: Secondary | ICD-10-CM | POA: Insufficient documentation

## 2012-02-27 DIAGNOSIS — M1710 Unilateral primary osteoarthritis, unspecified knee: Secondary | ICD-10-CM | POA: Insufficient documentation

## 2012-02-27 DIAGNOSIS — R040 Epistaxis: Secondary | ICD-10-CM | POA: Diagnosis not present

## 2012-02-27 DIAGNOSIS — E1142 Type 2 diabetes mellitus with diabetic polyneuropathy: Secondary | ICD-10-CM | POA: Insufficient documentation

## 2012-02-27 DIAGNOSIS — Z87891 Personal history of nicotine dependence: Secondary | ICD-10-CM | POA: Insufficient documentation

## 2012-02-27 DIAGNOSIS — Z79899 Other long term (current) drug therapy: Secondary | ICD-10-CM | POA: Insufficient documentation

## 2012-02-27 NOTE — Telephone Encounter (Signed)
Pt's wife called cardiology answering service. Pt recently had sinusitis. Has had intermittent nosebleeds. However, tonight it has been on/off much more frequently. He has spent a lot of time applying pressure but it has tended to recur. They were able to get it to stop for now but were wondering what to do next. I advised that he likely needs medical attention for this if it continues to occur. If it recurs tonight, he will need to proceed to ER for eval (or if he begins to feel faint/lightheaded or any other sx concerning to them). If it does not recur, they were instructed to contact PCP or go to urgent care tomorrow morning for further evaluation. His wife verbalized understanding and gratitude. Kyliyah Stirn PA-C

## 2012-02-27 NOTE — ED Notes (Signed)
Patient given discharge instructions, information, prescriptions, and diet order. Patient states that they adequately understand discharge information given and to return to ED if symptoms return or worsen.     

## 2012-02-27 NOTE — ED Provider Notes (Signed)
History     CSN: [id number]  Arrival date & time 02/27/12  2022   First MD Initiated Contact with Patient 02/27/12 2128      Chief Complaint  Patient presents with  . Epistaxis    (Consider location/radiation/quality/duration/timing/severity/associated sxs/prior treatment) Patient is a 78 y.o. male presenting with nosebleeds. The history is provided by the patient.  Epistaxis   He has been having nosebleeds from the right side of his nose for the last 3 days. They tend to occur at night or early morning. Bleeding has been minor and easy to control. He called his PCP and was told that he needed to be evaluated to make sure that he wouldn't choke on Levaquin the back of his throat. He has not on aspirin or Plavix or other antiplatelet agents and is not on any anticoagulants. He states that he has been treated for sinusitis and he does tend to blow his nose fairly hard.  Past Medical History  Diagnosis Date  . Arrhythmia     a fib  . ED (erectile dysfunction)   . Memory disorder   . 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 Hematochezia     has intermittent problem  . Arthritis     DJD knee - left  . Restless leg     treats with heat  . Cancer     bladder cancer - BCG Dr. Darvin Neighbours  . Diabetes mellitus     NIDDM on oral medication  . Genital warts     recurrent. Not a problem  . GERD (gastroesophageal reflux disease)     treated with PPI  . Hypertension     mild  . Neuropathy     burning discomfort feet/legs  . Irritable bladder     Past Surgical History  Procedure Date  . Cardiovascular stress test 03/28/2003    EF 55%.  Normal cardiolite study  . Appendectomy 1946  . Tonsillectomy 1938  . Joint replacement 1990's    left TKR  . Knee arthroscopy 1980's    right knee    Family History  Problem Relation Age of Onset  . Kidney disease Mother   . Heart disease Mother   . Stroke Mother   . Cancer Father     prostate  . Diabetes Neg Hx   . COPD Neg  Hx   . Mental illness Brother     History  Substance Use Topics  . Smoking status: Former Smoker    Quit date: 08/28/1975  . Smokeless tobacco: Former Neurosurgeon  . Alcohol Use: 3.5 oz/week    7 drink(s) per week      Review of Systems  HENT: Positive for nosebleeds.   All other systems reviewed and are negative.    Allergies  Cephalexin; Furacin; Macrodantin; and Zestril  Home Medications   Current Outpatient Rx  Name  Route  Sig  Dispense  Refill  . ACETAMINOPHEN ER 650 MG PO TBCR   Oral   Take 650 mg by mouth daily as needed. For pain         . VITAMIN C 1000 MG PO TABS   Oral   Take 1,000 mg by mouth daily.         . COD LIVER OIL PO   Oral   Take by mouth daily.          Lenn Sink DM PO   Oral   Take 5-10 mLs by mouth 2 (  two) times daily as needed. For cough         . DONEPEZIL HCL 10 MG PO TABS   Oral   Take 10 mg by mouth at bedtime as needed. For dementia         . DOXYCYCLINE HYCLATE 100 MG PO TABS   Oral   Take 1 tablet (100 mg total) by mouth 2 (two) times daily.   20 tablet   0   . FERROUS SULFATE 325 (65 FE) MG PO TBEC   Oral   Take 1 tablet (325 mg total) by mouth 2 (two) times daily.         [name] FEXOFENADINE HCL 180 MG PO TABS   Oral   Take 180 mg by mouth daily.         [name] FOLIC ACID 1 MG PO TABS   Oral   Take 1 mg by mouth daily.           . FUROSEMIDE 40 MG PO TABS   Oral   Take 20 mg by mouth daily as needed. For increased fluid         . GABAPENTIN 300 MG PO CAPS   Oral   Take 1 capsule (300 mg total) by mouth at bedtime.   90 capsule   3   . GLIPIZIDE 5 MG PO TABS   Oral   Take 1 tablet (5 mg total) by mouth daily.   90 tablet   3   . MEMANTINE HCL 10 MG PO TABS   Oral   Take 1 tablet (10 mg total) by mouth 2 (two) times daily.   60 tablet   1   . METOPROLOL TARTRATE 25 MG PO TABS   Oral   Take 12.5 mg by mouth 2 (two) times daily.         [name] ONE-DAILY MULTI VITAMINS PO TABS   Oral   Take 1  tablet by mouth daily.          . OXYBUTYNIN CHLORIDE ER 15 MG PO TB24   Oral   Take 1 tablet (15 mg total) by mouth daily.   90 tablet   3   . PANTOPRAZOLE SODIUM 40 MG PO TBEC   Oral   Take 1 tablet (40 mg total) by mouth daily.   90 tablet   3   . NITROGLYCERIN 0.4 MG SL SUBL   Sublingual   Place 0.4 mg under the tongue every 5 (five) minutes as needed.           [name] GLYCOLAX PO   Oral   Take 1 packet by mouth daily as needed. For constipation           BP 129/54  Pulse 60  Temp 99.3 F (37.4 C) (Oral)  Resp 18  SpO2 97%  Physical Exam  Nursing note and vitals reviewed. 78 year old male, resting comfortably and in no acute distress. Vital signs are normal. Oxygen saturation is 97%, which is normal. Head is normocephalic and atraumatic. PERRLA, EOMI. Oropharynx is clear. Bleeding site is noted on the right side of the nasal septum without active bleeding currently. Neck is nontender and supple without adenopathy or JVD. Back is nontender and there is no CVA tenderness. Lungs are clear without rales, wheezes, or rhonchi. Chest is nontender. Heart has regular rate and rhythm without murmur. Abdomen is soft, flat, nontender without masses or hepatosplenomegaly and peristalsis is normoactive. Extremities have no cyanosis or edema, full range  of motion is present. Skin is warm and dry without rash. Neurologic: Mental status is normal, cranial nerves are intact, there are no motor or sensory deficits.   ED Course  Procedures (including critical care time)    1. Anterior epistaxis       MDM  Minor anterior epistaxis. Treatment options were discussed with patient and it was decided to treat bleeding episodes as needed unless they prove to be difficult control. He's not on any anticoagulants or antiplatelet medications, so there is no need to be any change in his medications.        Dione Booze, MD 02/27/12 2149

## 2012-02-27 NOTE — ED Notes (Addendum)
Pt reports nosebleed x's 4-5 days that starts in the am after blowing nose. Pt not bleeding at present. States that his concern is at night he may start to bleed and aspirate. Pt also reports hx of low hgb. Reports nose has been bleeding on and off all day today.

## 2012-03-08 ENCOUNTER — Telehealth: Payer: Self-pay | Admitting: Cardiology

## 2012-03-08 DIAGNOSIS — R05 Cough: Secondary | ICD-10-CM

## 2012-03-08 MED ORDER — AZITHROMYCIN 250 MG PO TABS: ORAL_TABLET | ORAL | Status: DC

## 2012-03-08 NOTE — Telephone Encounter (Signed)
Patient wife states he has a deep cough with sputum (spits but no color). Did see Dr Debby Bud a couple of weeks ago and he ordered Doxycycline was given and did seem to get better.  Wife states tried to call Dr Debby Bud office and unable to get in touch (he was in a meeting).  Wife would like a Zpak called in. Will forward to  Dr. Patty Sermons for review

## 2012-03-08 NOTE — Telephone Encounter (Signed)
Discussed with  Dr. Patty Sermons and ok to give Zpak

## 2012-03-08 NOTE — Telephone Encounter (Signed)
New Problem:    Patient has a bad cough and would like to speak with you about ordering him a z-pak because she could not get in touch with his PCP.  Please call back.

## 2012-03-13 ENCOUNTER — Telehealth: Payer: Self-pay | Admitting: *Deleted

## 2012-03-13 ENCOUNTER — Telehealth: Payer: Self-pay | Admitting: Cardiology

## 2012-03-13 NOTE — Telephone Encounter (Signed)
Pls come for an OV this week Thx

## 2012-03-13 NOTE — Telephone Encounter (Signed)
Pt's wife called stating that the pt had been in the office a few weeks ago sick and received an rx for a Z-pack. Pt is sick again, he has chills a temp of 99.1 and does not want to come in. Please advise during Dr. Debby Bud absence.

## 2012-03-13 NOTE — Telephone Encounter (Signed)
Running a fever, having chills, and still not feeling good. Advised zpak still in system but since still feeling bad needs to be evaluated by PCP to rule out pneumonia, etc. Patient verbalized understanding

## 2012-03-13 NOTE — Telephone Encounter (Signed)
plz return call to pt wife Kathie Rhodes [phone] regarding the status of pt.  She wants another Z pack for patient.

## 2012-03-14 ENCOUNTER — Ambulatory Visit (INDEPENDENT_AMBULATORY_CARE_PROVIDER_SITE_OTHER): Payer: Medicare Other | Admitting: Internal Medicine

## 2012-03-14 ENCOUNTER — Encounter: Payer: Self-pay | Admitting: Internal Medicine

## 2012-03-14 VITALS — BP 122/62 | HR 83 | Temp 97.5°F | Wt 265.8 lb

## 2012-03-14 DIAGNOSIS — J209 Acute bronchitis, unspecified: Secondary | ICD-10-CM | POA: Diagnosis not present

## 2012-03-14 MED ORDER — LEVOFLOXACIN 500 MG PO TABS: 500.0000 mg | ORAL_TABLET | Freq: Every day | ORAL | Status: DC

## 2012-03-14 NOTE — Patient Instructions (Signed)

## 2012-03-14 NOTE — Progress Notes (Signed)
HPI  Pt presents to the clinic today with c/o cough and chest congestion x 3 weeks. He was last seen on 02/23/2012 by Dr. Posey Rea and diagnosed with sinusitis. He was prescribed doxycycline. He got better on the antibiotics but about 3 days after that it moved down into his chest. He called his cardiologist who prescribed him a zpak. He has not felt any better on that. He finished that up yesterday. Since that time, the symptoms are not getting any worse but they are not going away. He denies a history of asthma or allergies and has not had sick contacts that he knows of.  Review of Systems    Past Medical History  Diagnosis Date  . Arrhythmia     a fib  . ED (erectile dysfunction)   . Memory disorder   . 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 Hematochezia     has intermittent problem  . Arthritis     DJD knee - left  . Restless leg     treats with heat  . Cancer     bladder cancer - BCG Dr. Darvin Neighbours  . Diabetes mellitus     NIDDM on oral medication  . Genital warts     recurrent. Not a problem  . GERD (gastroesophageal reflux disease)     treated with PPI  . Hypertension     mild  . Neuropathy     burning discomfort feet/legs  . Irritable bladder     Family History  Problem Relation Age of Onset  . Kidney disease Mother   . Heart disease Mother   . Stroke Mother   . Cancer Father     prostate  . Diabetes Neg Hx   . COPD Neg Hx   . Mental illness Brother     History   Social History  . Marital Status: Married    Spouse Name: N/A    Number of Children: N/A  . Years of Education: N/A   Occupational History  . retired    Social History Main Topics  . Smoking status: Former Smoker    Quit date: 08/28/1975  . Smokeless tobacco: Former Neurosurgeon  . Alcohol Use: 3.5 oz/week    7 drink(s) per week  . Drug Use: No  . Sexually Active: Not Currently   Other Topics Concern  . Not on file   Social History Narrative   10th grade. Married '49. 3 sons  - '49, 51, 57; 1 dtr - '53. 8 grandchildren; 3 great-grands.  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 Lives with wife - independently. ACP- yes on CPR, OK for short term ventilation; no heroics including artificial hydration or nutrition.     Allergies  Allergen Reactions  . Cephalexin Other (See Comments)    Unknown reaction  . Furacin (Nitrofurazone) Other (See Comments)    Unknown reaction  . Macrodantin Other (See Comments)    Unknown reaction  . Zestril (Lisinopril) Other (See Comments)    Unknown reaction     Constitutional: Positive headache, fatigue and fever. Denies abrupt weight changes.  HEENT:   Denies eye redness, eye pain, pressure behind the eyes, facial pain, nasal congestion ear pain, ringing in the ears, wax buildup, runny nose or bloody nose. Respiratory: Positive cough and thick green sputum production. Denies difficulty breathing or shortness of breath.  Cardiovascular: Denies chest pain, chest tightness, palpitations or swelling  in the hands or feet.   No other specific complaints in a complete review of systems (except as listed in HPI above).  Objective:    General: Appears his stated age, well developed, well nourished in NAD. HEENT: Head: normal shape and size; Eyes: sclera white, no icterus, conjunctiva pink, PERRLA and EOMs intact; Ears: Tm's gray and intact, normal light reflex; Nose: mucosa pink and moist, septum midline; Throat/Mouth: + PND. Teeth present, mucosa pink and moist, no exudate noted, no lesions or ulcerations noted.  Neck: Mild cervical lymphadenopathy. Neck supple, trachea midline. No massses, lumps or thyromegaly present.  Cardiovascular: Normal rate and rhythm. S1,S2 noted.  No murmur, rubs or gallops noted. No JVD or BLE edema. No carotid bruits noted. Pulmonary/Chest: Normal effort and positive vesicular breath sounds. No respiratory distress. No wheezes, rales or ronchi noted.       Assessment & Plan:   Acute bronchitis  Levaquin x 7 days Hycodan cough syrup  RTC as needed or if symptoms persist.

## 2012-04-10 ENCOUNTER — Other Ambulatory Visit: Payer: Self-pay | Admitting: *Deleted

## 2012-04-10 ENCOUNTER — Other Ambulatory Visit: Payer: Self-pay | Admitting: Internal Medicine

## 2012-04-10 MED ORDER — MEMANTINE HCL 10 MG PO TABS: 10.0000 mg | ORAL_TABLET | Freq: Two times a day (BID) | ORAL | Status: DC

## 2012-04-10 NOTE — Telephone Encounter (Signed)
Patient's wife, Kathie Rhodes, left message requesting refill on Namenda. Requesting 90 day supply be sent to [facility].

## 2012-04-25 ENCOUNTER — Other Ambulatory Visit (INDEPENDENT_AMBULATORY_CARE_PROVIDER_SITE_OTHER): Payer: Medicare Other

## 2012-04-25 ENCOUNTER — Telehealth: Payer: Self-pay | Admitting: Cardiology

## 2012-04-25 DIAGNOSIS — E875 Hyperkalemia: Secondary | ICD-10-CM

## 2012-04-25 DIAGNOSIS — R609 Edema, unspecified: Secondary | ICD-10-CM

## 2012-04-25 DIAGNOSIS — D509 Iron deficiency anemia, unspecified: Secondary | ICD-10-CM

## 2012-04-25 NOTE — Telephone Encounter (Signed)
Legs are swelling, right bigger then the left. Swelling does go down some at night. No change in dietary intake. Taking lasix 30 mg daily for a while (last few weeks every day).  No increase shortness of breath. Has had to use NTG twice in the last week. Wife scheduled for coumadin check today.  Will forward to  Dr. Patty Sermons for review

## 2012-04-25 NOTE — Telephone Encounter (Signed)
Would have him to get a CBC in order to make sure that his anemia is not causing his edema to be worse.  After that we will consider whether he needs a higher dose of Lasix

## 2012-04-25 NOTE — Telephone Encounter (Signed)
New Problem:    Patient's wife called in because the patient's legs are swelling and she wanted to speak with you about it.  Please call back.

## 2012-04-25 NOTE — Telephone Encounter (Signed)
Advised wife and scheduled labs 

## 2012-04-26 ENCOUNTER — Telehealth: Payer: Self-pay | Admitting: *Deleted

## 2012-04-26 DIAGNOSIS — R079 Chest pain, unspecified: Secondary | ICD-10-CM

## 2012-04-26 LAB — CBC WITH DIFFERENTIAL/PLATELET
Basophils Relative: 0.3 % (ref 0.0–3.0)
Eosinophils Relative: 3.5 % (ref 0.0–5.0)
Hemoglobin: 10.5 g/dL — ABNORMAL LOW (ref 13.0–17.0)
Lymphocytes Relative: 14.8 % (ref 12.0–46.0)
MCV: 86.7 fl (ref 78.0–100.0)
Monocytes Absolute: 0.6 10*3/uL (ref 0.1–1.0)
Neutrophils Relative %: 66.4 % (ref 43.0–77.0)
RBC: 3.74 Mil/uL — ABNORMAL LOW (ref 4.22–5.81)
WBC: 4.1 10*3/uL — ABNORMAL LOW (ref 4.5–10.5)

## 2012-04-26 LAB — BASIC METABOLIC PANEL
Chloride: 101 mEq/L (ref 96–112)
Creatinine, Ser: 0.9 mg/dL (ref 0.4–1.5)
Potassium: 4.6 mEq/L (ref 3.5–5.1)
Sodium: 139 mEq/L (ref 135–145)

## 2012-04-26 NOTE — Telephone Encounter (Signed)
Message copied by Burnell Blanks on Wed Apr 26, 2012  5:51 PM ------      Message from: [name]      Created: Wed Apr 26, 2012  5:20 PM       Please report to patient.  The recent labs are stable. Continue same medication and careful diet. Hgb 10.5 down slightly.  BMET okay. Continue to take the iron tabs.  Recheck CBC when he returns on 2/19

## 2012-04-26 NOTE — Telephone Encounter (Signed)
Increase to 60 mg daily

## 2012-04-26 NOTE — Progress Notes (Signed)
Quick Note:  Please report to patient. The recent labs are stable. Continue same medication and careful diet. Hgb 10.5 down slightly. BMET okay. Continue to take the iron tabs. Recheck CBC when he returns on 2/19 ______

## 2012-04-26 NOTE — Telephone Encounter (Signed)
Advised wife of labs. Since swelling in legs not any better with the Lasix 30 mg daily should this be increase or keep as is until ov? Will forward to  Dr. Patty Sermons for review

## 2012-04-28 MED ORDER — NITROGLYCERIN 0.4 MG SL SUBL: 0.4000 mg | SUBLINGUAL_TABLET | SUBLINGUAL | Status: DC | PRN

## 2012-04-28 NOTE — Telephone Encounter (Signed)
Advised wife and patient

## 2012-05-01 ENCOUNTER — Other Ambulatory Visit: Payer: Self-pay | Admitting: *Deleted

## 2012-05-01 DIAGNOSIS — G629 Polyneuropathy, unspecified: Secondary | ICD-10-CM

## 2012-05-01 MED ORDER — GABAPENTIN 300 MG PO CAPS: 300.0000 mg | ORAL_CAPSULE | Freq: Every day | ORAL | Status: DC

## 2012-05-02 ENCOUNTER — Other Ambulatory Visit: Payer: Self-pay | Admitting: *Deleted

## 2012-05-02 DIAGNOSIS — N3281 Overactive bladder: Secondary | ICD-10-CM

## 2012-05-02 MED ORDER — OXYBUTYNIN CHLORIDE ER 15 MG PO TB24: 15.0000 mg | ORAL_TABLET | Freq: Every day | ORAL | Status: DC

## 2012-05-10 ENCOUNTER — Encounter: Payer: Self-pay | Admitting: Cardiology

## 2012-05-10 ENCOUNTER — Ambulatory Visit (INDEPENDENT_AMBULATORY_CARE_PROVIDER_SITE_OTHER): Payer: Medicare Other | Admitting: Cardiology

## 2012-05-10 ENCOUNTER — Other Ambulatory Visit (INDEPENDENT_AMBULATORY_CARE_PROVIDER_SITE_OTHER): Payer: Medicare Other

## 2012-05-10 VITALS — BP 122/68 | HR 65 | Ht 74.0 in | Wt 262.4 lb

## 2012-05-10 DIAGNOSIS — E119 Type 2 diabetes mellitus without complications: Secondary | ICD-10-CM | POA: Diagnosis not present

## 2012-05-10 DIAGNOSIS — D649 Anemia, unspecified: Secondary | ICD-10-CM | POA: Diagnosis not present

## 2012-05-10 DIAGNOSIS — I119 Hypertensive heart disease without heart failure: Secondary | ICD-10-CM

## 2012-05-10 DIAGNOSIS — R0989 Other specified symptoms and signs involving the circulatory and respiratory systems: Secondary | ICD-10-CM

## 2012-05-10 DIAGNOSIS — I4891 Unspecified atrial fibrillation: Secondary | ICD-10-CM

## 2012-05-10 DIAGNOSIS — D500 Iron deficiency anemia secondary to blood loss (chronic): Secondary | ICD-10-CM

## 2012-05-10 LAB — CBC WITH DIFFERENTIAL/PLATELET
Basophils Relative: 0.6 % (ref 0.0–3.0)
Eosinophils Absolute: 0.1 10*3/uL (ref 0.0–0.7)
Hemoglobin: 10.9 g/dL — ABNORMAL LOW (ref 13.0–17.0)
MCHC: 32.3 g/dL (ref 30.0–36.0)
MCV: 85 fl (ref 78.0–100.0)
Monocytes Absolute: 0.5 10*3/uL (ref 0.1–1.0)
Neutro Abs: 3.2 10*3/uL (ref 1.4–7.7)
RBC: 3.99 Mil/uL — ABNORMAL LOW (ref 4.22–5.81)

## 2012-05-10 LAB — BASIC METABOLIC PANEL
CO2: 29 mEq/L (ref 19–32)
Chloride: 103 mEq/L (ref 96–112)
Sodium: 140 mEq/L (ref 135–145)

## 2012-05-10 LAB — LIPID PANEL
HDL: 42.6 mg/dL (ref >39.00)
LDL Cholesterol: 70 mg/dL (ref 0–99)
Total CHOL/HDL Ratio: 3
Triglycerides: 38 mg/dL (ref 0.0–149.0)

## 2012-05-10 LAB — HEPATIC FUNCTION PANEL: Albumin: 4 g/dL (ref 3.5–5.2)

## 2012-05-10 MED ORDER — FUROSEMIDE 40 MG PO TABS: 40.0000 mg | ORAL_TABLET | Freq: Every day | ORAL | Status: DC

## 2012-05-10 NOTE — Progress Notes (Signed)
Quick Note:  Please report to patient. The recent labs are stable. Continue same medication and careful diet. Hgb is up slightly 10.9 ______

## 2012-05-10 NOTE — Assessment & Plan Note (Signed)
The patient has gained 11 pounds since last visit.  He is still moderately fluid overloaded.  We are going to have him increase his furosemide to 40 mg twice a day for 3 days and then go back to once a day.

## 2012-05-10 NOTE — Patient Instructions (Signed)
Will obtain labs today and call you with the results (lp/bmet/hfp/cbc)  INCREASE YOUR LASIX (FUROSEMIDE) TO 40 MG TWICE A DAY FOR 3 DAYS ONLY AND THEN BACK TO 40 MG DAILY  Your physician recommends that you schedule a follow-up appointment in: 4 months with fasting labs (lp/bmet/hfp/cbc)

## 2012-05-10 NOTE — Progress Notes (Signed)
[name] Date of Birth:  11-16-1927 [facility] [address] [place], [place]  [address] [phone]         Fax   [phone]  History of Present Illness: This pleasant 79 year old gentleman is seen for a scheduled followup office visit. He has a past history of chronic established atrial fibrillation. He is not on Coumadin because of recurrent GI bleeds. He also has problems with exogenous obesity and diabetes. Since last visit he has been doing well with no new cardiac symptoms. His exertional dyspnea is unchanged.   Current Outpatient Prescriptions  Medication Sig Dispense Refill  . acetaminophen (TYLENOL) 650 MG CR tablet Take 650 mg by mouth daily as needed. For pain      . Ascorbic Acid (VITAMIN C) 1000 MG tablet Take 1,000 mg by mouth daily.      . COD LIVER OIL PO Take by mouth daily.       [name] Dextromethorphan-Guaifenesin (ROBITUSSIN DM PO) Take 5-10 mLs by mouth 2 (two) times daily as needed. For cough      . donepezil (ARICEPT) 10 MG tablet Take 10 mg by mouth at bedtime as needed. For dementia      . ferrous sulfate 325 (65 FE) MG EC tablet Take 1 tablet (325 mg total) by mouth 2 (two) times daily.      . fexofenadine (ALLEGRA) 180 MG tablet Take 180 mg by mouth daily.      . folic acid (FOLVITE) 1 MG tablet Take 1 mg by mouth daily.        . furosemide (LASIX) 40 MG tablet Take 1 tablet (40 mg total) by mouth daily.  90 tablet  3  . gabapentin (NEURONTIN) 300 MG capsule Take 1 capsule (300 mg total) by mouth at bedtime.  90 capsule  3  . glipiZIDE (GLUCOTROL) 5 MG tablet Take 1 tablet (5 mg total) by mouth daily.  90 tablet  3  . levofloxacin (LEVAQUIN) 500 MG tablet Take 1 tablet (500 mg total) by mouth daily.  7 tablet  0  . memantine (NAMENDA) 10 MG tablet Take 1 tablet (10 mg total) by mouth 2 (two) times daily.  180 tablet  1  . metoprolol tartrate (LOPRESSOR) 25 MG tablet Take 12.5 mg by mouth 2 (two) times daily.      . Multiple Vitamin  (MULTIVITAMIN) tablet Take 1 tablet by mouth daily.       . nitroGLYCERIN (NITROSTAT) 0.4 MG SL tablet Place 1 tablet (0.4 mg total) under the tongue every 5 (five) minutes as needed.  100 tablet  3  . oxybutynin (DITROPAN XL) 15 MG 24 hr tablet Take 1 tablet (15 mg total) by mouth daily.  90 tablet  1  . pantoprazole (PROTONIX) 40 MG tablet Take 1 tablet (40 mg total) by mouth daily.  90 tablet  3  . Polyethylene Glycol 3350 (GLYCOLAX PO) Take 1 packet by mouth daily as needed. For constipation      . [DISCONTINUED] oxybutynin (DITROPAN XL) 15 MG 24 hr tablet Take 1 tablet (15 mg total) by mouth daily.  90 tablet  3   No current facility-administered medications for this visit.    Allergies  Allergen Reactions  . Cephalexin Other (See Comments)    Unknown reaction  . Furacin (Nitrofurazone) Other (See Comments)    Unknown reaction  . Macrodantin Other (See Comments)    Unknown reaction  . Zestril (Lisinopril) Other (See Comments)    Unknown reaction  Patient Active Problem List  Diagnosis  . Atrial fibrillation  . Iron deficiency anemia due to chronic blood loss  . Diabetes mellitus  . Dementia  . Benign hypertensive heart disease without heart failure  . Heart murmur  . Mitral regurgitation  . Sinusitis, acute    History  Smoking status  . Former Smoker  . Quit date: 08/28/1975  Smokeless tobacco  . Former User    History  Alcohol Use  . 3.5 oz/week  . 7 drink(s) per week    Family History  Problem Relation Age of Onset  . Kidney disease Mother   . Heart disease Mother   . Stroke Mother   . Cancer Father     prostate  . Diabetes Neg Hx   . COPD Neg Hx   . Mental illness Brother     Review of Systems: Constitutional: no fever chills diaphoresis or fatigue or change in weight.  Head and neck: no hearing loss, no epistaxis, no photophobia or visual disturbance. Respiratory: No cough, shortness of breath or wheezing. Cardiovascular: No chest pain  peripheral edema, palpitations. Gastrointestinal: No abdominal distention, no abdominal pain, no change in bowel habits hematochezia or melena. Genitourinary: No dysuria, no frequency, no urgency, no nocturia. Musculoskeletal:No arthralgias, no back pain, no gait disturbance or myalgias. Neurological: No dizziness, no headaches, no numbness, no seizures, no syncope, no weakness, no tremors. Hematologic: No lymphadenopathy, no easy bruising. Psychiatric: No confusion, no hallucinations, no sleep disturbance.    Physical Exam: Filed Vitals:   05/10/12 1447  BP: 122/68  Pulse: 65   the general appearance reveals a large gentleman in no distress.  Weight is up 11 pounds since last visit.The head and neck exam reveals pupils equal and reactive.  Extraocular movements are full.  There is no scleral icterus.  The mouth and pharynx are normal.  The neck is supple.  The carotids reveal no bruits.  The jugular venous pressure is normal.  The  thyroid is not enlarged.  There is no lymphadenopathy.  The chest reveals fine rales at the left base There are no  rhonchi.  Expansion of the chest is symmetrical.  The precordium is quiet.  The pulse is irregular in atrial fibrillation. The first heart sound is normal.  The second heart sound is physiologically split.  There is no murmur gallop rub or click.  There is no abnormal lift or heave.  The abdomen is soft and nontender.  The bowel sounds are normal.  The liver and spleen are not enlarged.  There are no abdominal masses.  There are no abdominal bruits.  Extremities reveal good pedal pulses.  There is 2+ pitting edema bilaterally.  There is no cyanosis or clubbing.  Strength is normal and symmetrical in all extremities.  There is no lateralizing weakness.  There are no sensory deficits.  The skin is warm and dry.  There is no rash.  EKG shows atrial fibrillation with a controlled ventricular response and no ischemic changes.   Assessment / Plan: Continue  same medication.  Be more careful with dietary salt.  For the next 3 days he will double up on his Lasix.  Recheck in 4 months for followup office visit and fasting lab work and CBC

## 2012-05-10 NOTE — Assessment & Plan Note (Signed)
The patient has not had any TIA symptoms. 

## 2012-05-10 NOTE — Assessment & Plan Note (Signed)
The patient has not had any recent hypoglycemic episodes.

## 2012-05-10 NOTE — Assessment & Plan Note (Signed)
The patient has not been aware of any hematochezia.  We're checking a CBC today

## 2012-05-12 ENCOUNTER — Telehealth: Payer: Self-pay | Admitting: *Deleted

## 2012-05-12 NOTE — Telephone Encounter (Signed)
Message copied by Burnell Blanks on Fri May 12, 2012  5:42 PM ------      Message from: [name]      Created: Wed May 10, 2012  9:53 PM       Please report to patient.  The recent labs are stable. Continue same medication and careful diet. Hgb is up slightly 10.9 ------

## 2012-05-12 NOTE — Telephone Encounter (Signed)
Advised patient of lab results  

## 2012-08-22 ENCOUNTER — Ambulatory Visit (INDEPENDENT_AMBULATORY_CARE_PROVIDER_SITE_OTHER): Payer: Medicare Other | Admitting: Cardiology

## 2012-08-22 ENCOUNTER — Encounter: Payer: Self-pay | Admitting: Cardiology

## 2012-08-22 VITALS — BP 130/66 | HR 74 | Ht 75.5 in | Wt 257.1 lb

## 2012-08-22 DIAGNOSIS — I340 Nonrheumatic mitral (valve) insufficiency: Secondary | ICD-10-CM

## 2012-08-22 DIAGNOSIS — I059 Rheumatic mitral valve disease, unspecified: Secondary | ICD-10-CM

## 2012-08-22 DIAGNOSIS — D649 Anemia, unspecified: Secondary | ICD-10-CM

## 2012-08-22 DIAGNOSIS — I119 Hypertensive heart disease without heart failure: Secondary | ICD-10-CM | POA: Diagnosis not present

## 2012-08-22 DIAGNOSIS — I4891 Unspecified atrial fibrillation: Secondary | ICD-10-CM

## 2012-08-22 DIAGNOSIS — E119 Type 2 diabetes mellitus without complications: Secondary | ICD-10-CM

## 2012-08-22 NOTE — Assessment & Plan Note (Signed)
He remains in atrial fibrillation on no long-term Coumadin because of recurrent GI bleeds.  He has not had any symptoms of TIA

## 2012-08-22 NOTE — Assessment & Plan Note (Signed)
The patient is not having any hypoglycemic episodes 

## 2012-08-22 NOTE — Patient Instructions (Signed)
Will obtain labs today and call you with the results (lp/bmet/hfp/cbc)  Your physician recommends that you continue on your current medications as directed. Please refer to the Current Medication list given to you today.  Your physician wants you to follow-up in: 4 months with fasting labs (lp/bmet/hfp/cbc) You will receive a reminder letter in the mail two months in advance. If you don't receive a letter, please call our office to schedule the follow-up appointment.  

## 2012-08-22 NOTE — Progress Notes (Signed)
Belenda Cruise Date of Birth:  [birth year] [facility] [address] [place], [place]  [address] [phone]         Fax   [phone]  History of Present Illness: This pleasant 79 year old gentleman is seen for a scheduled followup office visit. He has a past history of chronic established atrial fibrillation. He is not on Coumadin because of recurrent GI bleeds. He also has problems with exogenous obesity and diabetes. Since last visit he has been doing well with no new cardiac symptoms. His exertional dyspnea is unchanged.  He has occasional chest tightness and takes occasional sublingual nitroglycerin.  He had a cardiac catheterization on 08/07/01 showing normal coronary arteries and an ejection fraction of 60-65%.  He had an echocardiogram on 04/21/11 showing an ejection fraction of 60-65%, moderate mitral regurgitation, moderate tricuspid regurgitation, and coronary artery pressure was 46 mmHg.   Current Outpatient Prescriptions  Medication Sig Dispense Refill  . acetaminophen (TYLENOL) 650 MG CR tablet Take 650 mg by mouth daily as needed. For pain      . Ascorbic Acid (VITAMIN C) 1000 MG tablet Take 1,000 mg by mouth daily.      . COD LIVER OIL PO Take by mouth daily.       [name] Dextromethorphan-Guaifenesin (ROBITUSSIN DM PO) Take 5-10 mLs by mouth 2 (two) times daily as needed. For cough      . donepezil (ARICEPT) 10 MG tablet Take 10 mg by mouth at bedtime as needed. For dementia      . ferrous sulfate 325 (65 FE) MG EC tablet Take 1 tablet (325 mg total) by mouth 2 (two) times daily.      . fexofenadine (ALLEGRA) 180 MG tablet Take 180 mg by mouth daily.      . folic acid (FOLVITE) 1 MG tablet Take 1 mg by mouth daily.        . furosemide (LASIX) 40 MG tablet Take 1 tablet (40 mg total) by mouth daily.  90 tablet  3  . gabapentin (NEURONTIN) 300 MG capsule Take 1 capsule (300 mg total) by mouth at bedtime.  90 capsule  3  . glipiZIDE (GLUCOTROL) 5 MG tablet Take 1  tablet (5 mg total) by mouth daily.  90 tablet  3  . levofloxacin (LEVAQUIN) 500 MG tablet Take 1 tablet (500 mg total) by mouth daily.  7 tablet  0  . memantine (NAMENDA) 10 MG tablet Take 1 tablet (10 mg total) by mouth 2 (two) times daily.  180 tablet  1  . metoprolol tartrate (LOPRESSOR) 25 MG tablet Take 12.5 mg by mouth 2 (two) times daily.      . Multiple Vitamin (MULTIVITAMIN) tablet Take 1 tablet by mouth daily.       . nitroGLYCERIN (NITROSTAT) 0.4 MG SL tablet Place 1 tablet (0.4 mg total) under the tongue every 5 (five) minutes as needed.  100 tablet  3  . oxybutynin (DITROPAN XL) 15 MG 24 hr tablet Take 1 tablet (15 mg total) by mouth daily.  90 tablet  1  . pantoprazole (PROTONIX) 40 MG tablet Take 1 tablet (40 mg total) by mouth daily.  90 tablet  3  . Polyethylene Glycol 3350 (GLYCOLAX PO) Take 1 packet by mouth daily as needed. For constipation      . [DISCONTINUED] oxybutynin (DITROPAN XL) 15 MG 24 hr tablet Take 1 tablet (15 mg total) by mouth daily.  90 tablet  3   No current facility-administered medications for  this visit.    Allergies  Allergen Reactions  . Cephalexin Other (See Comments)    Unknown reaction  . Furacin (Nitrofurazone) Other (See Comments)    Unknown reaction  . Macrodantin Other (See Comments)    Unknown reaction  . Zestril (Lisinopril) Other (See Comments)    Unknown reaction    Patient Active Problem List   Diagnosis Date Noted  . Atrial fibrillation 09/01/2010    Priority: High  . Diabetes mellitus 09/01/2010    Priority: High  . Dementia 09/01/2010    Priority: High  . Sinusitis, acute 02/23/2012  . Mitral regurgitation 08/26/2011  . Heart murmur 04/15/2011  . Iron deficiency anemia due to chronic blood loss 09/01/2010  . Benign hypertensive heart disease without heart failure 09/01/2010    History  Smoking status  . Former Smoker  . Quit date: 08/28/1975  Smokeless tobacco  . Former User    History  Alcohol Use  . 3.5  oz/week  . 7 drink(s) per week    Family History  Problem Relation Age of Onset  . Kidney disease Mother   . Heart disease Mother   . Stroke Mother   . Cancer Father     prostate  . Diabetes Neg Hx   . COPD Neg Hx   . Mental illness Brother     Review of Systems: Constitutional: no fever chills diaphoresis or fatigue or change in weight.  Head and neck: no hearing loss, no epistaxis, no photophobia or visual disturbance. Respiratory: No cough, shortness of breath or wheezing. Cardiovascular: No chest pain peripheral edema, palpitations. Gastrointestinal: No abdominal distention, no abdominal pain, no change in bowel habits hematochezia or melena. Genitourinary: No dysuria, no frequency, no urgency, no nocturia. Musculoskeletal:No arthralgias, no back pain, no gait disturbance or myalgias. Neurological: No dizziness, no headaches, no numbness, no seizures, no syncope, no weakness, no tremors. Hematologic: No lymphadenopathy, no easy bruising. Psychiatric: No confusion, no hallucinations, no sleep disturbance.    Physical Exam: Filed Vitals:   08/22/12 1454  BP: 130/66  Pulse: 74   the general appearance is that of a large middle-aged gentleman in no distress.  He has lost 5 pounds since last visit.The head and neck exam reveals pupils equal and reactive.  Extraocular movements are full.  There is no scleral icterus.  The mouth and pharynx are normal.  The neck is supple.  The carotids reveal no bruits.  The jugular venous pressure is normal.  The  thyroid is not enlarged.  There is no lymphadenopathy.  The chest is clear to percussion and auscultation.  There are no rales or rhonchi.  Expansion of the chest is symmetrical.  The precordium is quiet.  The first heart sound is normal.  The second heart sound is physiologically split.  There is no  gallop rub or click.  There is a grade 2/6 systolic murmur at the left sternal edge.  There is no abnormal lift or heave.  The abdomen is  soft and nontender.  The bowel sounds are normal.  The liver and spleen are not enlarged.  There are no abdominal masses.  There are no abdominal bruits.  Extremities reveal good pedal pulses.  There is mild bilateral peripheral edema  There is no cyanosis or clubbing.  Strength is normal and symmetrical in all extremities.  There is no lateralizing weakness.  There are no sensory deficits.  The skin is warm and dry.  There is no rash.     Assessment /  Plan: Continue same medication.  We are checking lab work today.  He has not noticed any hematochezia or evidence of recent GI bleeding although his stools are dark from iron therapy. Recheck in 4 months for followup office visit CBC lipid panel hepatic function panel and basal metabolic panel

## 2012-08-22 NOTE — Assessment & Plan Note (Signed)
The patient is not having any orthopnea or paroxysmal nocturnal dyspnea.  He does have some mild chronic lower extremity edema

## 2012-08-23 LAB — CBC WITH DIFFERENTIAL/PLATELET
Basophils Absolute: 0 10*3/uL (ref 0.0–0.1)
Eosinophils Absolute: 0.1 10*3/uL (ref 0.0–0.7)
Hemoglobin: 10.5 g/dL — ABNORMAL LOW (ref 13.0–17.0)
Lymphocytes Relative: 16.8 % (ref 12.0–46.0)
Lymphs Abs: 0.7 10*3/uL (ref 0.7–4.0)
MCHC: 31.8 g/dL (ref 30.0–36.0)
MCV: 85.7 fl (ref 78.0–100.0)
Monocytes Absolute: 0.6 10*3/uL (ref 0.1–1.0)
Neutro Abs: 2.7 10*3/uL (ref 1.4–7.7)
RDW: 17.1 % — ABNORMAL HIGH (ref 11.5–14.6)

## 2012-08-23 LAB — BASIC METABOLIC PANEL
CO2: 27 mEq/L (ref 19–32)
Calcium: 9.2 mg/dL (ref 8.4–10.5)
Chloride: 105 mEq/L (ref 96–112)
Glucose, Bld: 91 mg/dL (ref 70–99)
Sodium: 142 mEq/L (ref 135–145)

## 2012-08-23 LAB — LIPID PANEL
HDL: 43.6 mg/dL (ref >39.00)
Total CHOL/HDL Ratio: 3

## 2012-08-23 LAB — HEPATIC FUNCTION PANEL
AST: 24 U/L (ref 0–37)
Alkaline Phosphatase: 73 U/L (ref 39–117)
Total Bilirubin: 0.9 mg/dL (ref 0.3–1.2)

## 2012-08-23 NOTE — Progress Notes (Signed)
Quick Note:  Please report to patient. The recent labs are stable. Continue same medication and careful diet. ______ 

## 2012-09-07 ENCOUNTER — Other Ambulatory Visit: Payer: Self-pay

## 2012-09-07 MED ORDER — DONEPEZIL HCL 10 MG PO TABS: 10.0000 mg | ORAL_TABLET | Freq: Every evening | ORAL | Status: DC | PRN

## 2012-09-12 ENCOUNTER — Other Ambulatory Visit: Payer: Self-pay | Admitting: *Deleted

## 2012-09-12 MED ORDER — PANTOPRAZOLE SODIUM 40 MG PO TBEC: 40.0000 mg | DELAYED_RELEASE_TABLET | Freq: Every day | ORAL | Status: DC

## 2012-09-26 ENCOUNTER — Ambulatory Visit (INDEPENDENT_AMBULATORY_CARE_PROVIDER_SITE_OTHER): Payer: Medicare Other | Admitting: Internal Medicine

## 2012-09-26 ENCOUNTER — Encounter: Payer: Self-pay | Admitting: Internal Medicine

## 2012-09-26 VITALS — BP 120/62 | HR 64 | Temp 97.7°F | Resp 12 | Wt 253.8 lb

## 2012-09-26 DIAGNOSIS — H8309 Labyrinthitis, unspecified ear: Secondary | ICD-10-CM

## 2012-09-26 MED ORDER — MECLIZINE HCL 12.5 MG PO TABS: 12.5000 mg | ORAL_TABLET | Freq: Three times a day (TID) | ORAL | Status: DC | PRN

## 2012-09-26 NOTE — Progress Notes (Signed)
Subjective:    Patient ID: [name], male    DOB: May 17, 1927, 79 y.o.   MRN: [id number]  HPI Mr. Ribeiro reports onset about six weeks ago of room spinning while in bed but when he got up he was better. Yesterday bending over to pick up a lid off the kitchen floor he got dizzy and fell. He required EMS assistance to get up. He does admit to some problems with balance.  Past Medical History  Diagnosis Date  . Arrhythmia     a fib  . ED (erectile dysfunction)   . Memory disorder   . 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 Hematochezia     has intermittent problem  . Arthritis     DJD knee - left  . Restless leg     treats with heat  . Cancer     bladder cancer - BCG Dr. Darvin Neighbours  . Diabetes mellitus     NIDDM on oral medication  . Genital warts     recurrent. Not a problem  . GERD (gastroesophageal reflux disease)     treated with PPI  . Hypertension     mild  . Neuropathy     burning discomfort feet/legs  . Irritable bladder    Past Surgical History  Procedure Laterality Date  . Cardiovascular stress test  03/28/2003    EF 55%.  Normal cardiolite study  . Appendectomy  1946  . Tonsillectomy  1938  . Joint replacement  1990's    left TKR  . Knee arthroscopy  1980's    right knee   Family History  Problem Relation Age of Onset  . Kidney disease Mother   . Heart disease Mother   . Stroke Mother   . Cancer Father     prostate  . Diabetes Neg Hx   . COPD Neg Hx   . Mental illness Brother    History   Social History  . Marital Status: Married    Spouse Name: N/A    Number of Children: N/A  . Years of Education: N/A   Occupational History  . retired    Social History Main Topics  . Smoking status: Former Smoker    Quit date: 08/28/1975  . Smokeless tobacco: Former Neurosurgeon  . Alcohol Use: 3.5 oz/week    7 drink(s) per week  . Drug Use: No  . Sexually Active: Not Currently   Other Topics Concern  . Not on file   Social History Narrative    10th grade. Married '49. 3 sons - '49, 51, 57; 1 dtr - '53. 8 grandchildren; 3 great-grands.  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 Lives with wife - independently. ACP- yes on CPR, OK for short term ventilation; no heroics including artificial hydration or nutrition.     Current Outpatient Prescriptions on File Prior to Visit  Medication Sig Dispense Refill  . acetaminophen (TYLENOL) 650 MG CR tablet Take 650 mg by mouth daily as needed. For pain      . Ascorbic Acid (VITAMIN C) 1000 MG tablet Take 1,000 mg by mouth daily.      . COD LIVER OIL PO Take by mouth daily.       [name] Dextromethorphan-Guaifenesin (ROBITUSSIN DM PO) Take 5-10 mLs by mouth 2 (two) times daily as needed. For cough      . donepezil (ARICEPT) 10 MG tablet Take 1  tablet (10 mg total) by mouth at bedtime as needed. For dementia  90 tablet  0  . ferrous sulfate 325 (65 FE) MG EC tablet Take 1 tablet (325 mg total) by mouth 2 (two) times daily.      . fexofenadine (ALLEGRA) 180 MG tablet Take 180 mg by mouth daily.      . folic acid (FOLVITE) 1 MG tablet Take 1 mg by mouth daily.        . furosemide (LASIX) 40 MG tablet Take 1 tablet (40 mg total) by mouth daily.  90 tablet  3  . gabapentin (NEURONTIN) 300 MG capsule Take 1 capsule (300 mg total) by mouth at bedtime.  90 capsule  3  . glipiZIDE (GLUCOTROL) 5 MG tablet Take 1 tablet (5 mg total) by mouth daily.  90 tablet  3  . levofloxacin (LEVAQUIN) 500 MG tablet Take 1 tablet (500 mg total) by mouth daily.  7 tablet  0  . memantine (NAMENDA) 10 MG tablet Take 1 tablet (10 mg total) by mouth 2 (two) times daily.  180 tablet  1  . metoprolol tartrate (LOPRESSOR) 25 MG tablet Take 12.5 mg by mouth 2 (two) times daily.      . Multiple Vitamin (MULTIVITAMIN) tablet Take 1 tablet by mouth daily.       . nitroGLYCERIN (NITROSTAT) 0.4 MG SL tablet Place 1 tablet (0.4 mg total) under the tongue every 5 (five) minutes as needed.   100 tablet  3  . oxybutynin (DITROPAN XL) 15 MG 24 hr tablet Take 1 tablet (15 mg total) by mouth daily.  90 tablet  1  . pantoprazole (PROTONIX) 40 MG tablet Take 1 tablet (40 mg total) by mouth daily.  90 tablet  3  . Polyethylene Glycol 3350 (GLYCOLAX PO) Take 1 packet by mouth daily as needed. For constipation      . [DISCONTINUED] oxybutynin (DITROPAN XL) 15 MG 24 hr tablet Take 1 tablet (15 mg total) by mouth daily.  90 tablet  3   No current facility-administered medications on file prior to visit.        Review of Systems System review is negative for any constitutional, cardiac, pulmonary, GI or neuro symptoms or complaints other than as described in the HPI.       Objective:   Physical Exam Filed Vitals:   09/26/12 1642  BP: 120/62  Pulse: 64  Temp: 97.7 F (36.5 C)  Resp: 12   gen'l - elderly large framed man in no distress Cor - RRR Pulm - normal respirations MSK - normal ROM of left hip while in the sitting position. Neuro A&O x 3, speech clear, CN II-XII grossly in tact. Able to ambulate independently using a cane.        Assessment & Plan:  Labyrinthitis - mild symptoms.  Plan Meclizine 12.5 mg three times a day until you don't have balance problems.   If the meclizine doesn't help let me know - we may need to do brain imaging.

## 2012-09-26 NOTE — Patient Instructions (Addendum)
Labyrinthitis - mild symptoms.  Plan Meclizine 12.5 mg three times a day until you don't have balance problems.   If the meclizine doesn't help let me know - we may need to do brain imaging.  Labyrinthitis (Inner Ear Inflammation) Your exam shows you have an inner ear disturbance or labyrinthitis. The cause of this condition is not known. But it may be due to a virus infection. The symptoms of labyrinthitis include vertigo or dizziness made worse by motion, nausea and vomiting. The onset of labyrinthitis may be very sudden. It usually lasts for a few days and then clears up over 1-2 weeks. The treatment of an inner ear disturbance includes bed rest and medications to reduce dizziness, nausea, and vomiting. You should stay away from alcohol, tranquilizers, caffeine, nicotine, or any medicine your doctor thinks may make your symptoms worse. Further testing may be needed to evaluate your hearing and balance system. Please see your doctor or go to the emergency room right away if you have:  Increasing vertigo, earache, loss of hearing, or ear drainage.  Headache, blurred vision, trouble walking, fainting, or fever.  Persistent vomiting, dehydration, or extreme weakness. Document Released: 03/08/2005 Document Revised: 05/31/2011 Document Reviewed: 08/24/2006 [facility] Patient Information 2014 [place], [place].

## 2012-10-25 ENCOUNTER — Other Ambulatory Visit: Payer: Self-pay

## 2012-10-31 ENCOUNTER — Other Ambulatory Visit: Payer: Self-pay | Admitting: *Deleted

## 2012-10-31 ENCOUNTER — Other Ambulatory Visit: Payer: Self-pay

## 2012-10-31 DIAGNOSIS — N3281 Overactive bladder: Secondary | ICD-10-CM

## 2012-10-31 DIAGNOSIS — E119 Type 2 diabetes mellitus without complications: Secondary | ICD-10-CM

## 2012-10-31 MED ORDER — GLIPIZIDE 5 MG PO TABS: 5.0000 mg | ORAL_TABLET | Freq: Every day | ORAL | Status: DC

## 2012-10-31 MED ORDER — OXYBUTYNIN CHLORIDE ER 15 MG PO TB24: 15.0000 mg | ORAL_TABLET | Freq: Every day | ORAL | Status: DC

## 2012-10-31 MED ORDER — DONEPEZIL HCL 10 MG PO TABS: 10.0000 mg | ORAL_TABLET | Freq: Every evening | ORAL | Status: DC | PRN

## 2012-11-17 ENCOUNTER — Ambulatory Visit (INDEPENDENT_AMBULATORY_CARE_PROVIDER_SITE_OTHER)
Admission: RE | Admit: 2012-11-17 | Discharge: 2012-11-17 | Disposition: A | Discharge status: Home / Self Care | Payer: Medicare Other | Source: Ambulatory Visit | Attending: Internal Medicine | Admitting: Internal Medicine

## 2012-11-17 ENCOUNTER — Other Ambulatory Visit (INDEPENDENT_AMBULATORY_CARE_PROVIDER_SITE_OTHER): Payer: Medicare Other

## 2012-11-17 ENCOUNTER — Encounter: Payer: Self-pay | Admitting: Internal Medicine

## 2012-11-17 ENCOUNTER — Ambulatory Visit (INDEPENDENT_AMBULATORY_CARE_PROVIDER_SITE_OTHER): Payer: Medicare Other | Admitting: Internal Medicine

## 2012-11-17 VITALS — BP 112/62 | HR 84 | Temp 98.6°F | Ht 75.0 in | Wt 250.2 lb

## 2012-11-17 DIAGNOSIS — R10819 Abdominal tenderness, unspecified site: Secondary | ICD-10-CM | POA: Diagnosis not present

## 2012-11-17 DIAGNOSIS — J209 Acute bronchitis, unspecified: Secondary | ICD-10-CM

## 2012-11-17 DIAGNOSIS — M545 Low back pain, unspecified: Secondary | ICD-10-CM

## 2012-11-17 DIAGNOSIS — R509 Fever, unspecified: Secondary | ICD-10-CM

## 2012-11-17 DIAGNOSIS — J90 Pleural effusion, not elsewhere classified: Secondary | ICD-10-CM | POA: Diagnosis not present

## 2012-11-17 LAB — URINALYSIS, ROUTINE W REFLEX MICROSCOPIC
Bilirubin Urine: NEGATIVE
Hgb urine dipstick: NEGATIVE
Ketones, ur: NEGATIVE
Leukocytes, UA: NEGATIVE
Specific Gravity, Urine: 1.015 (ref 1.000–1.030)
Urine Glucose: NEGATIVE
Urobilinogen, UA: 0.2 (ref 0.0–1.0)

## 2012-11-17 LAB — HEPATIC FUNCTION PANEL
AST: 22 U/L (ref 0–37)
Albumin: 3.7 g/dL (ref 3.5–5.2)
Alkaline Phosphatase: 120 U/L — ABNORMAL HIGH (ref 39–117)
Bilirubin, Direct: 0.3 mg/dL (ref 0.0–0.3)

## 2012-11-17 LAB — CBC WITH DIFFERENTIAL/PLATELET
HCT: 35.4 % — ABNORMAL LOW (ref 39.0–52.0)
Hemoglobin: 11.8 g/dL — ABNORMAL LOW (ref 13.0–17.0)
MCHC: 33.4 g/dL (ref 30.0–36.0)
Platelets: 152 10*3/uL (ref 150.0–400.0)
RDW: 16.6 % — ABNORMAL HIGH (ref 11.5–14.6)

## 2012-11-17 LAB — BASIC METABOLIC PANEL
BUN: 12 mg/dL (ref 6–23)
Calcium: 9 mg/dL (ref 8.4–10.5)
Creatinine, Ser: 0.9 mg/dL (ref 0.4–1.5)
GFR: 88.94 mL/min (ref >60.00)

## 2012-11-17 LAB — SEDIMENTATION RATE: Sed Rate: 49 mm/hr — ABNORMAL HIGH (ref 0–22)

## 2012-11-17 MED ORDER — CYCLOBENZAPRINE HCL 5 MG PO TABS: 5.0000 mg | ORAL_TABLET | Freq: Three times a day (TID) | ORAL | Status: DC | PRN

## 2012-11-17 MED ORDER — LEVOFLOXACIN 500 MG PO TABS: 500.0000 mg | ORAL_TABLET | Freq: Every day | ORAL | Status: DC

## 2012-11-17 NOTE — Assessment & Plan Note (Addendum)
No fever on exam but feels warm, appears mild ill, for lab evaluation, urine studies, blood cx,  to f/u any worsening symptoms or concerns  /Note:  Total time for pt hx, exam, review of record with pt in the room, determination of diagnoses and plan for further eval and tx is > 40 min, with over 50% spent in coordination and counseling of patient

## 2012-11-17 NOTE — Assessment & Plan Note (Signed)
By exam seems c/w msk spasm, for flexeril prn, but consider LS Spine MRi for neg lab evaluation, worsening pain/fever or gait change - r/o discitis or similar

## 2012-11-17 NOTE — Progress Notes (Signed)
Subjective:    Patient ID: [name], male    DOB: December 04, 1927, 79 y.o.   MRN: [id number]  HPI  Here with daughter who helps with hx due to pt dementia, with 1 wk onset feeling poorly, general weaknees, feeling ill, needs to wear jacket as he is frequently cold, but denies chills, rigors.  No HA, ST, cough, and Pt denies chest pain, increased sob or doe, wheezing, orthopnea, PND, increased LE swelling, palpitations, dizziness or syncope.  Denies urinary symptoms such as dysuria, frequency, urgency, flank pain, hematuria or n/v, fever, chills. Pt does have onset right LBP (unusual for him) he thinks started 2-3 days ago after turning over in bed, but no bowel or bladder change, fever, wt loss,  worsening LE pain/numbness/weakness, gait change or falls.   Past Medical History  Diagnosis Date  . Arrhythmia     a fib  . ED (erectile dysfunction)   . Memory disorder   . 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 Hematochezia     has intermittent problem  . Arthritis     DJD knee - left  . Restless leg     treats with heat  . Cancer     bladder cancer - [facility] Dr. Darvin Neighbours  . Diabetes mellitus     NIDDM on oral medication  . Genital warts     recurrent. Not a problem  . GERD (gastroesophageal reflux disease)     treated with PPI  . Hypertension     mild  . Neuropathy     burning discomfort feet/legs  . Irritable bladder    Past Surgical History  Procedure Laterality Date  . Cardiovascular stress test  03/28/2003    EF 55%.  Normal cardiolite study  . Appendectomy  1946  . Tonsillectomy  1938  . Joint replacement  1990's    left TKR  . Knee arthroscopy  1980's    right knee    reports that he quit smoking about 37 years ago. He has quit using smokeless tobacco. He reports that he drinks about 3.5 ounces of alcohol per week. He reports that he does not use illicit drugs. family history includes Cancer in his father; Heart disease in his mother; Kidney disease in his  mother; Mental illness in his brother; Stroke in his mother. There is no history of Diabetes or COPD. Allergies  Allergen Reactions  . Cephalexin Other (See Comments)    Unknown reaction  . Furacin [Nitrofurazone] Other (See Comments)    Unknown reaction  . Macrodantin Other (See Comments)    Unknown reaction  . Zestril [Lisinopril] Other (See Comments)    Unknown reaction   Current Outpatient Prescriptions on File Prior to Visit  Medication Sig Dispense Refill  . acetaminophen (TYLENOL) 650 MG CR tablet Take 650 mg by mouth daily as needed. For pain      . Ascorbic Acid (VITAMIN C) 1000 MG tablet Take 1,000 mg by mouth daily.      . COD LIVER OIL PO Take by mouth daily.       [name] Dextromethorphan-Guaifenesin (ROBITUSSIN DM PO) Take 5-10 mLs by mouth 2 (two) times daily as needed. For cough      . donepezil (ARICEPT) 10 MG tablet Take 1 tablet (10 mg total) by mouth at bedtime as needed. For dementia  90 tablet  3  . ferrous sulfate 325 (65 FE) MG EC tablet Take 1 tablet (325 mg total) by mouth  2 (two) times daily.      . fexofenadine (ALLEGRA) 180 MG tablet Take 180 mg by mouth daily.      . folic acid (FOLVITE) 1 MG tablet Take 1 mg by mouth daily.        . furosemide (LASIX) 40 MG tablet Take 1 tablet (40 mg total) by mouth daily.  90 tablet  3  . gabapentin (NEURONTIN) 300 MG capsule Take 1 capsule (300 mg total) by mouth at bedtime.  90 capsule  3  . glipiZIDE (GLUCOTROL) 5 MG tablet Take 1 tablet (5 mg total) by mouth daily.  90 tablet  3  . meclizine (ANTIVERT) 12.5 MG tablet Take 1 tablet (12.5 mg total) by mouth 3 (three) times daily as needed.  30 tablet  5  . memantine (NAMENDA) 10 MG tablet Take 1 tablet (10 mg total) by mouth 2 (two) times daily.  180 tablet  1  . metoprolol tartrate (LOPRESSOR) 25 MG tablet Take 12.5 mg by mouth 2 (two) times daily.      . Multiple Vitamin (MULTIVITAMIN) tablet Take 1 tablet by mouth daily.       . nitroGLYCERIN (NITROSTAT) 0.4 MG SL tablet  Place 1 tablet (0.4 mg total) under the tongue every 5 (five) minutes as needed.  100 tablet  3  . oxybutynin (DITROPAN XL) 15 MG 24 hr tablet Take 1 tablet (15 mg total) by mouth daily.  90 tablet  1  . pantoprazole (PROTONIX) 40 MG tablet Take 1 tablet (40 mg total) by mouth daily.  90 tablet  3  . Polyethylene Glycol 3350 (GLYCOLAX PO) Take 1 packet by mouth daily as needed. For constipation      . [DISCONTINUED] oxybutynin (DITROPAN XL) 15 MG 24 hr tablet Take 1 tablet (15 mg total) by mouth daily.  90 tablet  3   No current facility-administered medications on file prior to visit.   Review of Systems  Constitutional: Negative for unexpected weight change, or unusual diaphoresis  HENT: Negative for tinnitus.   Eyes: Negative for photophobia and visual disturbance.  Respiratory: Negative for choking and stridor.   Gastrointestinal: Negative for vomiting and blood in stool.  Genitourinary: Negative for hematuria and decreased urine volume.  Musculoskeletal: Negative for acute joint swelling Skin: Negative for color change and wound.  Neurological: Negative for tremors and numbness other than noted  Psychiatric/Behavioral: Negative for decreased concentration or  hyperactivity.       Objective:   Physical Exam BP 112/62  Pulse 84  Temp(Src) 98.6 F (37 C) (Oral)  Ht 6\' 3"  (1.905 m)  Wt 250 lb 4 oz (113.513 kg)  BMI 31.28 kg/m2  SpO2 95% VS noted, mild ill and fatigued appearing, in a jacket in the office Constitutional: Pt appears well-developed and well-nourished.  HENT: Head: NCAT.  Right Ear: External ear normal.  Left Ear: External ear normal.  Eyes: Conjunctivae and EOM are normal. Pupils are equal, round, and reactive to light.  Neck: Normal range of motion. Neck supple.  Cardiovascular: Normal rate and regular rhythm.   - no new murmur Pulmonary/Chest: Effort normal and breath sounds normal.  Abd:  Soft, NT, non-distended, + BS except for variable tender to low mid  abd, no flank pain Spine: nontender to midline, has palpable MSK spasm localized to right lumbar paravertebral without rash or other swelling Neurological: Pt is alert. Not confused , motor 5/5 Skin: Skin is overly warm. No erythema. Left knee with mild warmth, no effusion, pain  not worse on ambulation Psychiatric: Pt behavior is normal. Thought content normal.     Assessment & Plan:

## 2012-11-17 NOTE — Assessment & Plan Note (Signed)
?   UTi - for urine studies, for empiric levaquin asd, blood cx

## 2012-11-17 NOTE — Patient Instructions (Signed)
Please take all new medication as prescribed - the antibiotic, and the muscle relaxer  Please continue all other medications as before  Please go to the XRAY Department in the Basement (go straight as you get off the elevator) for the x-ray testing  Please go to the LAB in the Basement (turn left off the elevator) for the tests to be done today  You will be contacted by phone if any changes need to be made immediately.  Otherwise, you will receive a letter about your results with an explanation, but please check with MyChart first.  Please remember to sign up for My Chart if you have not done so, as this will be important to you in the future with finding out test results, communicating by private email, and scheduling acute appointments online when needed.  Please go to ER if fever and/or back pain worsens to consider infection in the spinal area, or if other symptoms such as weakness, falls, chills, difficulty walking

## 2012-11-18 ENCOUNTER — Encounter (HOSPITAL_COMMUNITY): Payer: Self-pay | Admitting: Emergency Medicine

## 2012-11-18 ENCOUNTER — Emergency Department (HOSPITAL_COMMUNITY): Payer: Medicare Other

## 2012-11-18 ENCOUNTER — Emergency Department (HOSPITAL_COMMUNITY)
Admission: EM | Admit: 2012-11-18 | Discharge: 2012-11-18 | Disposition: A | Discharge status: Home / Self Care | Payer: Medicare Other | Attending: Emergency Medicine | Admitting: Emergency Medicine

## 2012-11-18 DIAGNOSIS — Z9089 Acquired absence of other organs: Secondary | ICD-10-CM | POA: Insufficient documentation

## 2012-11-18 DIAGNOSIS — R109 Unspecified abdominal pain: Secondary | ICD-10-CM | POA: Diagnosis not present

## 2012-11-18 DIAGNOSIS — Z8719 Personal history of other diseases of the digestive system: Secondary | ICD-10-CM | POA: Insufficient documentation

## 2012-11-18 DIAGNOSIS — D649 Anemia, unspecified: Secondary | ICD-10-CM | POA: Diagnosis not present

## 2012-11-18 DIAGNOSIS — Z792 Long term (current) use of antibiotics: Secondary | ICD-10-CM | POA: Diagnosis not present

## 2012-11-18 DIAGNOSIS — Z8551 Personal history of malignant neoplasm of bladder: Secondary | ICD-10-CM | POA: Insufficient documentation

## 2012-11-18 DIAGNOSIS — IMO0002 Reserved for concepts with insufficient information to code with codable children: Secondary | ICD-10-CM | POA: Insufficient documentation

## 2012-11-18 DIAGNOSIS — Z8669 Personal history of other diseases of the nervous system and sense organs: Secondary | ICD-10-CM | POA: Insufficient documentation

## 2012-11-18 DIAGNOSIS — Z87448 Personal history of other diseases of urinary system: Secondary | ICD-10-CM | POA: Insufficient documentation

## 2012-11-18 DIAGNOSIS — I10 Essential (primary) hypertension: Secondary | ICD-10-CM | POA: Insufficient documentation

## 2012-11-18 DIAGNOSIS — Z87891 Personal history of nicotine dependence: Secondary | ICD-10-CM | POA: Diagnosis not present

## 2012-11-18 DIAGNOSIS — E119 Type 2 diabetes mellitus without complications: Secondary | ICD-10-CM | POA: Insufficient documentation

## 2012-11-18 DIAGNOSIS — Z8619 Personal history of other infectious and parasitic diseases: Secondary | ICD-10-CM | POA: Diagnosis not present

## 2012-11-18 DIAGNOSIS — Z79899 Other long term (current) drug therapy: Secondary | ICD-10-CM | POA: Diagnosis not present

## 2012-11-18 DIAGNOSIS — K219 Gastro-esophageal reflux disease without esophagitis: Secondary | ICD-10-CM | POA: Diagnosis not present

## 2012-11-18 DIAGNOSIS — K5900 Constipation, unspecified: Secondary | ICD-10-CM | POA: Diagnosis not present

## 2012-11-18 DIAGNOSIS — G589 Mononeuropathy, unspecified: Secondary | ICD-10-CM | POA: Insufficient documentation

## 2012-11-18 DIAGNOSIS — M1710 Unilateral primary osteoarthritis, unspecified knee: Secondary | ICD-10-CM | POA: Insufficient documentation

## 2012-11-18 DIAGNOSIS — Z8679 Personal history of other diseases of the circulatory system: Secondary | ICD-10-CM | POA: Diagnosis not present

## 2012-11-18 DIAGNOSIS — R6889 Other general symptoms and signs: Secondary | ICD-10-CM | POA: Diagnosis not present

## 2012-11-18 LAB — URINALYSIS, ROUTINE W REFLEX MICROSCOPIC
Glucose, UA: NEGATIVE mg/dL
Ketones, ur: NEGATIVE mg/dL
Nitrite: NEGATIVE
Specific Gravity, Urine: 1.028 (ref 1.005–1.030)
pH: 7 (ref 5.0–8.0)

## 2012-11-18 LAB — URINE MICROSCOPIC-ADD ON

## 2012-11-18 MED ORDER — IOHEXOL 300 MG/ML  SOLN
50.0000 mL | Freq: Once | INTRAMUSCULAR | Status: AC | PRN
  Administered 2012-11-18: 50 mL via ORAL

## 2012-11-18 MED ORDER — IOHEXOL 300 MG/ML  SOLN
100.0000 mL | Freq: Once | INTRAMUSCULAR | Status: AC | PRN
  Administered 2012-11-18: 100 mL via INTRAVENOUS

## 2012-11-18 MED ORDER — IOHEXOL 300 MG/ML  SOLN: 50.0000 mL | Freq: Once | INTRAMUSCULAR | Status: DC | PRN

## 2012-11-18 NOTE — ED Notes (Addendum)
EMS reports that patient has been feeling more "stopped up" lately. EMS reports abdominal rigidity in right and left lower quadrants. Pt reports no pain upon palpation, bowel sounds present in all quadrants. Wife reports that patient has been running fever of 103 degrees. Reports taking Motrin last night. EMS reports 98.3 degrees. Pt has eaten this morning with no nausea or vomiting. Pt also has not had a bowel movement in a week. Pt did self enema with little success.

## 2012-11-18 NOTE — ED Provider Notes (Signed)
CSN: [id number]     Arrival date & time 11/18/12  1349 History   First MD Initiated Contact with Patient 11/18/12 1458     Chief Complaint  Patient presents with  . Abdominal Pain    Patient is a 79 y.o. male presenting with abdominal pain.  Abdominal Pain  EMS reports that patient has been feeling more "stopped up" lately. EMS reports abdominal rigidity in right and left lower quadrants. Pt reports no pain upon palpation, bowel sounds present in all quadrants. Wife reports that patient has been running fever of 103 degrees. Reports taking Motrin last night. EMS reports 98.3 degrees. Pt has eaten this morning with no nausea or vomiting. Pt also has not had a bowel movement in a week. Pt did self enema with little success.  Patient saw his private physician yesterday who placed him on antibiotics.  Past Medical History  Diagnosis Date  . Arrhythmia     a fib  . ED (erectile dysfunction)   . Memory disorder   . 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 Hematochezia     has intermittent problem  . Arthritis     DJD knee - left  . Restless leg     treats with heat  . Cancer     bladder cancer - [facility] Dr. Darvin Neighbours  . Diabetes mellitus     NIDDM on oral medication  . Genital warts     recurrent. Not a problem  . GERD (gastroesophageal reflux disease)     treated with PPI  . Hypertension     mild  . Neuropathy     burning discomfort feet/legs  . Irritable bladder    Past Surgical History  Procedure Laterality Date  . Cardiovascular stress test  03/28/2003    EF 55%.  Normal cardiolite study  . Appendectomy  1946  . Tonsillectomy  1938  . Joint replacement  1990's    left TKR  . Knee arthroscopy  1980's    right knee   Family History  Problem Relation Age of Onset  . Kidney disease Mother   . Heart disease Mother   . Stroke Mother   . Cancer Father     prostate  . Diabetes Neg Hx   . COPD Neg Hx   . Mental illness Brother    History  Substance Use Topics   . Smoking status: Former Smoker    Quit date: 08/28/1975  . Smokeless tobacco: Former Neurosurgeon  . Alcohol Use: 3.5 oz/week    7 drink(s) per week    Review of Systems  Gastrointestinal: Positive for abdominal pain.    Allergies  Cephalexin; Furacin; Macrodantin; and Zestril  Home Medications   Current Outpatient Rx  Name  Route  Sig  Dispense  Refill  . acetaminophen (TYLENOL) 325 MG tablet   Oral   Take 650 mg by mouth every 6 (six) hours as needed for pain or fever.         . Ascorbic Acid (VITAMIN C) 1000 MG tablet   Oral   Take 1,000 mg by mouth daily.         . COD LIVER OIL PO   Oral   Take 1 capsule by mouth daily.          . cyclobenzaprine (FLEXERIL) 5 MG tablet   Oral   Take 1 tablet (5 mg total) by mouth 3 (three) times daily as needed for muscle spasms.  50 tablet   0   . donepezil (ARICEPT) 10 MG tablet   Oral   Take 10 mg by mouth at bedtime.         . ferrous sulfate 325 (65 FE) MG EC tablet   Oral   Take 1 tablet (325 mg total) by mouth 2 (two) times daily.         . folic acid (FOLVITE) 1 MG tablet   Oral   Take 1 mg by mouth 2 (two) times daily.          . furosemide (LASIX) 40 MG tablet   Oral   Take 20 mg by mouth every morning.         . gabapentin (NEURONTIN) 300 MG capsule   Oral   Take 1 capsule (300 mg total) by mouth at bedtime.   90 capsule   3   . glipiZIDE (GLUCOTROL) 5 MG tablet   Oral   Take 1 tablet (5 mg total) by mouth daily.   90 tablet   3   . levofloxacin (LEVAQUIN) 500 MG tablet   Oral   Take 500 mg by mouth daily.         . memantine (NAMENDA) 10 MG tablet   Oral   Take 1 tablet (10 mg total) by mouth 2 (two) times daily.   180 tablet   1   . metoprolol tartrate (LOPRESSOR) 25 MG tablet   Oral   Take 12.5 mg by mouth every morning.          . Multiple Vitamin (MULTIVITAMIN) tablet   Oral   Take 1 tablet by mouth every morning.          . Multiple Vitamins-Minerals (PRESERVISION  AREDS 2) CAPS   Oral   Take 1 capsule by mouth every morning.         . nitroGLYCERIN (NITROSTAT) 0.4 MG SL tablet   Sublingual   Place 0.4 mg under the tongue every 5 (five) minutes as needed for chest pain.         [name] oxybutynin (DITROPAN XL) 15 MG 24 hr tablet   Oral   Take 1 tablet (15 mg total) by mouth daily.   90 tablet   1     Additional refills from PCP   . pantoprazole (PROTONIX) 40 MG tablet   Oral   Take 1 tablet (40 mg total) by mouth daily.   90 tablet   3   . Polyethylene Glycol 3350 (GLYCOLAX PO)   Oral   Take 1 packet by mouth daily as needed. For constipation          BP 144/51  Pulse 88  Temp(Src) 98.2 F (36.8 C) (Oral)  Resp 20  SpO2 95% Physical Exam  Nursing note and vitals reviewed. Constitutional: He is oriented to person, place, and time. He appears well-developed and well-nourished. No distress.  HENT:  Head: Normocephalic and atraumatic.  Eyes: Pupils are equal, round, and reactive to light.  Neck: Normal range of motion.  Cardiovascular: Normal rate and intact distal pulses.   Pulmonary/Chest: No respiratory distress.  Abdominal: Normal appearance. He exhibits no distension. There is no tenderness. There is no rebound and no guarding.  Musculoskeletal: Normal range of motion.  Neurological: He is alert and oriented to person, place, and time. No cranial nerve deficit.  Skin: Skin is warm and dry. No rash noted.  Psychiatric: He has a normal mood and affect. His behavior is normal.  ED Course  Procedures (including critical care time) Labs Review Labs Reviewed  URINALYSIS, ROUTINE W REFLEX MICROSCOPIC - Abnormal; Notable for the following:    Color, Urine AMBER (*)    Leukocytes, UA TRACE (*)    All other components within normal limits  URINE MICROSCOPIC-ADD ON    Recent Results (from the past 2160 hour(s))  BASIC METABOLIC PANEL     Status: Abnormal   Collection Time    11/17/12 12:16 PM      Result Value Range    Sodium 134 (*) 135 - 145 mEq/L   Potassium 4.6  3.5 - 5.1 mEq/L   Chloride 99  96 - 112 mEq/L   CO2 27  19 - 32 mEq/L   Glucose, Bld 117 (*) 70 - 99 mg/dL   BUN 12  6 - 23 mg/dL   Creatinine, Ser 0.9  0.4 - 1.5 mg/dL   Calcium 9.0  8.4 - 16.1 mg/dL   GFR 09.60  >45.40 mL/min  HEPATIC FUNCTION PANEL     Status: Abnormal   Collection Time    11/17/12 12:16 PM      Result Value Range   Total Bilirubin 1.1  0.3 - 1.2 mg/dL   Bilirubin, Direct 0.3  0.0 - 0.3 mg/dL   Alkaline Phosphatase 120 (*) 39 - 117 U/L   AST 22  0 - 37 U/L   ALT 14  0 - 53 U/L   Total Protein 7.2  6.0 - 8.3 g/dL   Albumin 3.7  3.5 - 5.2 g/dL  CBC WITH DIFFERENTIAL     Status: Abnormal   Collection Time    11/17/12 12:16 PM      Result Value Range   WBC 6.8  4.5 - 10.5 K/uL   Comment: MANUAL DIFF=69SEG,2BAND,15LYMPH,14MONO   RBC 4.23  4.22 - 5.81 Mil/uL   Hemoglobin 11.8 (*) 13.0 - 17.0 g/dL   HCT 98.1 (*) 19.1 - 47.8 %   MCV 83.8  78.0 - 100.0 fl   MCHC 33.4  30.0 - 36.0 g/dL   RDW 29.5 (*) 62.1 - 30.8 %   Platelets 152.0  150.0 - 400.0 K/uL  URINALYSIS, ROUTINE W REFLEX MICROSCOPIC     Status: None   Collection Time    11/17/12 12:16 PM      Result Value Range   Color, Urine YELLOW  Yellow;Lt. Yellow   APPearance CLEAR  Clear   Specific Gravity, Urine 1.015  1.000-1.030   pH 6.5  5.0 - 8.0   Total Protein, Urine NEGATIVE  Negative   Urine Glucose NEGATIVE  Negative   Ketones, ur NEGATIVE  Negative   Bilirubin Urine NEGATIVE  Negative   Hgb urine dipstick NEGATIVE  Negative   Urobilinogen, UA 0.2  0.0 - 1.0   Leukocytes, UA NEGATIVE  Negative   Nitrite NEGATIVE  Negative   WBC, UA none seen  0-2/hpf   RBC / HPF none seen  0-2/hpf  SEDIMENTATION RATE     Status: Abnormal   Collection Time    11/17/12 12:16 PM      Result Value Range   Sed Rate 49 (*) 0 - 22 mm/hr  URINALYSIS, ROUTINE W REFLEX MICROSCOPIC     Status: Abnormal   Collection Time    11/18/12  2:27 PM      Result Value Range    Color, Urine AMBER (*) YELLOW   Comment: BIOCHEMICALS MAY BE AFFECTED BY COLOR   APPearance CLEAR  CLEAR  Specific Gravity, Urine 1.028  1.005 - 1.030   pH 7.0  5.0 - 8.0   Glucose, UA NEGATIVE  NEGATIVE mg/dL   Hgb urine dipstick NEGATIVE  NEGATIVE   Bilirubin Urine NEGATIVE  NEGATIVE   Ketones, ur NEGATIVE  NEGATIVE mg/dL   Protein, ur NEGATIVE  NEGATIVE mg/dL   Urobilinogen, UA 0.2  0.0 - 1.0 mg/dL   Nitrite NEGATIVE  NEGATIVE   Leukocytes, UA TRACE (*) NEGATIVE  URINE MICROSCOPIC-ADD ON     Status: None   Collection Time    11/18/12  2:27 PM      Result Value Range   Squamous Epithelial / LPF RARE  RARE   WBC, UA 0-2  <3 WBC/hpf   RBC / HPF 0-2  <3 RBC/hpf   Imaging Review Dg Chest 2 View  11/17/2012   *RADIOLOGY REPORT*  Clinical Data: Fever.  CHEST - 2 VIEW  Comparison: 02/27/2009.  Findings: The heart is enlarged but stable.  The mediastinal and hilar contours are normal.  The lungs are clear of acute process. Mild chronic bronchitic changes.  A small right pleural effusion is noted.  The bony thorax is intact.  IMPRESSION:  1.  Cardiac enlargement, stable. 2.  Small right pleural effusion. 3.  No infiltrates or edema.  Mild chronic bronchitic changes.   Original Report Authenticated By: Rudie Meyer, M.D.   Dg Abd 1 View  11/18/2012   *RADIOLOGY REPORT*  Clinical Data: Constipation.  Fever.  Lower abdominal pain.  ABDOMEN - 1 VIEW  Comparison: None.  Findings: Nonobstructive bowel gas pattern is present.  Stool and bowel gas is present in the rectosigmoid.  No dilated loops of large or small bowel.  Likely atherosclerotic calcification present in the left upper quadrant.  DISH is present.  Chronic right-sided L4 compression fracture and lower lumbar spondylosis noted. Phleboliths in the pelvis.  IMPRESSION: No acute abnormality.   Original Report Authenticated By: Andreas Newport, M.D.   Ct Abdomen Pelvis W Contrast  11/18/2012   *RADIOLOGY REPORT*  Clinical Data: Fever and  abdominal rigidity.  History of prior appendectomy.  CT ABDOMEN AND PELVIS WITH CONTRAST  Technique:  Multidetector CT imaging of the abdomen and pelvis was performed following the standard protocol during bolus administration of intravenous contrast.  Contrast: 50mL OMNIPAQUE IOHEXOL 300 MG/ML  SOLN, OMNIPAQUE IOHEXOL 300 MG/ML  SOLN  Comparison: 10/29/2008  Findings: The visualized lung bases show a small right pleural effusion. The liver demonstrates potentially subtle early cirrhotic changes without evidence of mass or biliary ductal dilatation.  The gallbladder is contracted.  The spleen, pancreas, adrenal glands and kidneys are unremarkable. Bowel shows moderate fecal material in the proximal colon without evidence of bowel obstruction or perforation.  No abnormal fluid collection or abscess is seen.  Atherosclerotic plaque is identified in the abdominal aorta and iliac arteries without evidence of aneurysmal disease.  There is no evidence of hernia, mass or enlarged lymph node.  The bladder is decompressed with delayed imaging showing some early contrast excretion.  Degenerative changes are present in the spine.  [facility]. Diagnosed with aspiration pneumonia today. Has been having dysphagia, threw up levoquin. Pt sob, 85% on RA upon EMS arrival. EMS put pt on 5L [place]. Results for orders placed during the hospital encounter of 11/18/12  URINALYSIS, ROUTINE W REFLEX MICROSCOPIC      Result Value Range   Color, Urine AMBER (*) YELLOW   APPearance CLEAR  CLEAR   Specific  Gravity, Urine 1.028  1.005 - 1.030   pH 7.0  5.0 - 8.0   Glucose, UA NEGATIVE  NEGATIVE mg/dL   Hgb urine dipstick NEGATIVE  NEGATIVE   Bilirubin Urine NEGATIVE  NEGATIVE   Ketones, ur NEGATIVE  NEGATIVE mg/dL   Protein, ur NEGATIVE  NEGATIVE mg/dL   Urobilinogen, UA 0.2  0.0 - 1.0 mg/dL   Nitrite NEGATIVE  NEGATIVE   Leukocytes, UA TRACE (*) NEGATIVE  URINE MICROSCOPIC-ADD ON      Result Value Range    Squamous Epithelial / LPF RARE  RARE   WBC, UA 0-2  <3 WBC/hpf   RBC / HPF 0-2  <3 RBC/hpf   Dg Chest 2 View  11/17/2012   *RADIOLOGY REPORT*  Clinical Data: Fever.  CHEST - 2 VIEW  Comparison: 02/27/2009.  Findings: The heart is enlarged but stable.  The mediastinal and hilar contours are normal.  The lungs are clear of acute process. Mild chronic bronchitic changes.  A small right pleural effusion is noted.  The bony thorax is intact.  IMPRESSION:  1.  Cardiac enlargement, stable. 2.  Small right pleural effusion. 3.  No infiltrates or edema.  Mild chronic bronchitic changes.   Original Report Authenticated By: Rudie Meyer, M.D.   Dg Abd 1 View  11/18/2012   *RADIOLOGY REPORT*  Clinical Data: Constipation.  Fever.  Lower abdominal pain.  ABDOMEN - 1 VIEW  Comparison: None.  Findings: Nonobstructive bowel gas pattern is present.  Stool and bowel gas is present in the rectosigmoid.  No dilated loops of large or small bowel.  Likely atherosclerotic calcification present in the left upper quadrant.  DISH is present.  Chronic right-sided L4 compression fracture and lower lumbar spondylosis noted. Phleboliths in the pelvis.  IMPRESSION: No acute abnormality.   Original Report Authenticated By: Andreas Newport, M.D.   Ct Abdomen Pelvis W Contrast  11/18/2012   *RADIOLOGY REPORT*  Clinical Data: Fever and abdominal rigidity.  History of prior appendectomy.  CT ABDOMEN AND PELVIS WITH CONTRAST  Technique:  Multidetector CT imaging of the abdomen and pelvis was performed following the standard protocol during bolus administration of intravenous contrast.  Contrast: 50mL OMNIPAQUE IOHEXOL 300 MG/ML  SOLN, OMNIPAQUE IOHEXOL 300 MG/ML  SOLN  Comparison: 10/29/2008  Findings: The visualized lung bases show a small right pleural effusion. The liver demonstrates potentially subtle early cirrhotic changes without evidence of mass or biliary ductal dilatation.  The gallbladder is contracted.  The spleen, pancreas,  adrenal glands and kidneys are unremarkable. Bowel shows moderate fecal material in the proximal colon without evidence of bowel obstruction or perforation.  No abnormal fluid collection or abscess is seen.  Atherosclerotic plaque is identified in the abdominal aorta and iliac arteries without evidence of aneurysmal disease.  There is no evidence of hernia, mass or enlarged lymph node.  The bladder is decompressed with delayed imaging showing some early contrast excretion.  Degenerative changes are present in the spine.  IMPRESSION: No acute findings.  A small right pleural effusion, potential early cirrhotic changes of the liver and moderate fecal material in the proximal colon are noted.   Original Report Authenticated By: Irish Lack, M.D.         MDM   1. Constipation     Nelia Shi, MD 11/18/12 [phone]

## 2012-11-21 ENCOUNTER — Telehealth: Payer: Self-pay | Admitting: *Deleted

## 2012-11-21 NOTE — Telephone Encounter (Signed)
[facility] Triage Call Report Triage Record Num: [id number] Operator: Lesli Albee Patient Name: [name] Call Date & Time: 11/18/2012 12:45:51PM Patient Phone: [phone] PCP: Patient Gender: Male PCP Fax : Patient DOB: [birth year] Practice Name: [facility] Reason for Call: Caller: [name]/Patient; PCP: Illene Regulus (Adults only); CB#: [phone]; Call regarding Back Pain; Pt has developed a bowel blockage. Per Spouse - he was at the office yesterday for abdominal pain and fever. Rn reviewed EPIC. No cause of fever determined/pt put on Levaquin preventatively and advised to be evaulated if he worsened. Temp today is 103.3 and spouse says his abdomen feels "blocked" as if he can't pass a bm (even though he has). RN advised Ed for worsening sx. Spouse will take him to [facility] Protocol(s) Used: Abdominal Pain Recommended Outcome per Protocol: See ED Immediately Reason for Outcome: Generalized abdominal pain that progresses to localized pain AND any of the following: loss of appetite, vomiting starting after pain, any fever, OR unable to carry out normal activities Care Advice: ~ 08/

## 2012-11-24 ENCOUNTER — Other Ambulatory Visit: Payer: Self-pay

## 2012-11-24 MED ORDER — METOPROLOL TARTRATE 25 MG PO TABS: 12.5000 mg | ORAL_TABLET | Freq: Every morning | ORAL | Status: DC

## 2012-12-01 DIAGNOSIS — Z8551 Personal history of malignant neoplasm of bladder: Secondary | ICD-10-CM | POA: Diagnosis not present

## 2012-12-12 DIAGNOSIS — Z8551 Personal history of malignant neoplasm of bladder: Secondary | ICD-10-CM | POA: Diagnosis not present

## 2012-12-12 DIAGNOSIS — H353190 Nonexudative age-related macular degeneration, unspecified eye, stage unspecified: Secondary | ICD-10-CM | POA: Diagnosis not present

## 2012-12-20 ENCOUNTER — Ambulatory Visit (INDEPENDENT_AMBULATORY_CARE_PROVIDER_SITE_OTHER): Payer: Medicare Other | Admitting: Cardiology

## 2012-12-20 ENCOUNTER — Encounter: Payer: Self-pay | Admitting: Cardiology

## 2012-12-20 VITALS — BP 148/80 | HR 80 | Ht 72.0 in | Wt 238.0 lb

## 2012-12-20 DIAGNOSIS — F0390 Unspecified dementia without behavioral disturbance: Secondary | ICD-10-CM

## 2012-12-20 DIAGNOSIS — E119 Type 2 diabetes mellitus without complications: Secondary | ICD-10-CM | POA: Diagnosis not present

## 2012-12-20 DIAGNOSIS — R634 Abnormal weight loss: Secondary | ICD-10-CM

## 2012-12-20 DIAGNOSIS — Z23 Encounter for immunization: Secondary | ICD-10-CM | POA: Diagnosis not present

## 2012-12-20 DIAGNOSIS — I059 Rheumatic mitral valve disease, unspecified: Secondary | ICD-10-CM | POA: Diagnosis not present

## 2012-12-20 DIAGNOSIS — I4891 Unspecified atrial fibrillation: Secondary | ICD-10-CM

## 2012-12-20 DIAGNOSIS — D649 Anemia, unspecified: Secondary | ICD-10-CM | POA: Diagnosis not present

## 2012-12-20 DIAGNOSIS — R609 Edema, unspecified: Secondary | ICD-10-CM | POA: Diagnosis not present

## 2012-12-20 DIAGNOSIS — I340 Nonrheumatic mitral (valve) insufficiency: Secondary | ICD-10-CM

## 2012-12-20 DIAGNOSIS — I119 Hypertensive heart disease without heart failure: Secondary | ICD-10-CM

## 2012-12-20 NOTE — Assessment & Plan Note (Signed)
The patient has chronic permanent atrial fibrillation.  No Coumadin because of GI bleeds.  He has not had any TIA symptoms

## 2012-12-20 NOTE — Assessment & Plan Note (Signed)
The patient has not been experiencing any symptoms of congestive heart failure.  No headaches or dizziness.  No syncope.

## 2012-12-20 NOTE — Assessment & Plan Note (Signed)
The patient has lost 19 pounds since we saw him in June.  He states that he is not eating as much as usual.  His family indicates that his dementia is worse.  He is on Namenda.  According to his family the patient is extremely inactive at home.

## 2012-12-20 NOTE — Progress Notes (Signed)
[name] Date of Birth:  [birth year] [facility] [address] [place], [place]  [address] [phone]         Fax   [phone]  History of Present Illness: This pleasant 79 year old gentleman is seen for a scheduled followup office visit. He has a past history of chronic established atrial fibrillation. He is not on Coumadin because of recurrent GI bleeds. He also has problems with exogenous obesity and diabetes. Since last visit he has been doing well with no new cardiac symptoms. His exertional dyspnea is unchanged.  He has occasional chest tightness and takes occasional sublingual nitroglycerin.  He had a cardiac catheterization on 08/07/01 showing normal coronary arteries and an ejection fraction of 60-65%.  He had an echocardiogram on 04/21/11 showing an ejection fraction of 60-65%, moderate mitral regurgitation, moderate tricuspid regurgitation, and coronary artery pressure was 46 mmHg.   Current Outpatient Prescriptions  Medication Sig Dispense Refill  . acetaminophen (TYLENOL) 325 MG tablet Take 650 mg by mouth every 6 (six) hours as needed for pain or fever.      . COD LIVER OIL PO Take 1 capsule by mouth daily.       . cyclobenzaprine (FLEXERIL) 5 MG tablet Take 1 tablet (5 mg total) by mouth 3 (three) times daily as needed for muscle spasms.  50 tablet  0  . donepezil (ARICEPT) 10 MG tablet Take 10 mg by mouth at bedtime.      . ferrous sulfate 325 (65 FE) MG EC tablet Take 1 tablet (325 mg total) by mouth 2 (two) times daily.      . folic acid (FOLVITE) 1 MG tablet Take 1 mg by mouth 2 (two) times daily.       [name] gabapentin (NEURONTIN) 300 MG capsule Take 1 capsule (300 mg total) by mouth at bedtime.  90 capsule  3  . glipiZIDE (GLUCOTROL) 5 MG tablet Take 1 tablet (5 mg total) by mouth daily.  90 tablet  3  . memantine (NAMENDA) 10 MG tablet Take 1 tablet (10 mg total) by mouth 2 (two) times daily.  180 tablet  1  . metoprolol tartrate (LOPRESSOR) 25 MG  tablet Take 0.5 tablets (12.5 mg total) by mouth every morning.  30 tablet  5  . Multiple Vitamin (MULTIVITAMIN) tablet Take 1 tablet by mouth every morning.       . Multiple Vitamins-Minerals (PRESERVISION AREDS 2) CAPS Take 1 capsule by mouth every morning.      . nitroGLYCERIN (NITROSTAT) 0.4 MG SL tablet Place 0.4 mg under the tongue every 5 (five) minutes as needed for chest pain.      [name] oxybutynin (DITROPAN XL) 15 MG 24 hr tablet Take 1 tablet (15 mg total) by mouth daily.  90 tablet  1  . pantoprazole (PROTONIX) 40 MG tablet Take 1 tablet (40 mg total) by mouth daily.  90 tablet  3  . Polyethylene Glycol 3350 (GLYCOLAX PO) Take 1 packet by mouth daily as needed. For constipation      . furosemide (LASIX) 40 MG tablet Take 20 mg by mouth every morning.      . [DISCONTINUED] oxybutynin (DITROPAN XL) 15 MG 24 hr tablet Take 1 tablet (15 mg total) by mouth daily.  90 tablet  3   No current facility-administered medications for this visit.    Allergies  Allergen Reactions  . Cephalexin Other (See Comments)    Unknown reaction  . Furacin [Nitrofurazone] Other (See Comments)  Unknown reaction  . Macrodantin Other (See Comments)    Unknown reaction  . Zestril [Lisinopril] Other (See Comments)    Unknown reaction    Patient Active Problem List   Diagnosis Date Noted  . Atrial fibrillation 09/01/2010    Priority: High  . Diabetes mellitus 09/01/2010    Priority: High  . Dementia 09/01/2010    Priority: High  . Weight loss, non-intentional 12/20/2012  . Fever 11/17/2012  . Abdominal tenderness 11/17/2012  . Low back pain 11/17/2012  . Sinusitis, acute 02/23/2012  . Mitral regurgitation 08/26/2011  . Heart murmur 04/15/2011  . Iron deficiency anemia due to chronic blood loss 09/01/2010  . Benign hypertensive heart disease without heart failure 09/01/2010    History  Smoking status  . Former Smoker  . Quit date: 08/28/1975  Smokeless tobacco  . Former User    History   Alcohol Use  . 3.5 oz/week  . 7 drink(s) per week    Family History  Problem Relation Age of Onset  . Kidney disease Mother   . Heart disease Mother   . Stroke Mother   . Cancer Father     prostate  . Diabetes Neg Hx   . COPD Neg Hx   . Mental illness Brother     Review of Systems: Constitutional: no fever chills diaphoresis or fatigue or change in weight.  Head and neck: no hearing loss, no epistaxis, no photophobia or visual disturbance. Respiratory: No cough, shortness of breath or wheezing. Cardiovascular: No chest pain peripheral edema, palpitations. Gastrointestinal: No abdominal distention, no abdominal pain, no change in bowel habits hematochezia or melena. Genitourinary: No dysuria, no frequency, no urgency, no nocturia. Musculoskeletal:No arthralgias, no back pain, no gait disturbance or myalgias. Neurological: No dizziness, no headaches, no numbness, no seizures, no syncope, no weakness, no tremors. Hematologic: No lymphadenopathy, no easy bruising. Psychiatric: No confusion, no hallucinations, no sleep disturbance.    Physical Exam: Filed Vitals:   12/20/12 1535  BP: 148/80  Pulse: 80   the general appearance is that of a large middle-aged gentleman in no distress.  He has lost 5 pounds since last visit.The head and neck exam reveals pupils equal and reactive.  Extraocular movements are full.  There is no scleral icterus.  The mouth and pharynx are normal.  The neck is supple.  The carotids reveal no bruits.  The jugular venous pressure is normal.  The  thyroid is not enlarged.  There is no lymphadenopathy.  The chest is clear to percussion and auscultation.  There are no rales or rhonchi.  Expansion of the chest is symmetrical.  The precordium is quiet.  The first heart sound is normal.  The second heart sound is physiologically split.  There is no  gallop rub or click.  There is a grade 2/6 systolic murmur at the left sternal edge.  There is no abnormal lift or  heave.  The abdomen is soft and nontender.  The bowel sounds are normal.  The liver and spleen are not enlarged.  There are no abdominal masses.  There are no abdominal bruits.  Extremities reveal good pedal pulses.  There is mild bilateral peripheral edema  There is no cyanosis or clubbing.  Strength is normal and symmetrical in all extremities.  There is no lateralizing weakness.  There are no sensory deficits.  The skin is warm and dry.  There is no rash.     Assessment / Plan: Continue same medication.  We are checking  lab work today.  He has not noticed any hematochezia or evidence of recent GI bleeding although his stools are dark from iron therapy.  He has not been taking his Lasix regularly and his edema is a little worse today.  He is wearing support hose which is commendable. Recheck in 4 months for followup office visit CBC lipid panel hepatic function panel and basal metabolic panel.  The family will talk to his PCP concerning possible neurology referral for his dementia.

## 2012-12-20 NOTE — Assessment & Plan Note (Signed)
Family history concerned about his worsening dementia and lack of interest in things.  He has not been to [place] beach house in almost a year and it doesn't seem to bother him.

## 2012-12-20 NOTE — Patient Instructions (Addendum)
Will obtain labs today and call you with the results (lp/bmet/hfp/cbc)  Your physician recommends that you continue on your current medications as directed. Please refer to the Current Medication list given to you today.  Your physician wants you to follow-up in: 4 month ov You will receive a reminder letter in the mail two months in advance. If you don't receive a letter, please call our office to schedule the follow-up appointment.

## 2012-12-21 LAB — CBC WITH DIFFERENTIAL/PLATELET
Basophils Absolute: 0 10*3/uL (ref 0.0–0.1)
Basophils Relative: 0.4 % (ref 0.0–3.0)
Eosinophils Absolute: 0.1 10*3/uL (ref 0.0–0.7)
HCT: 34.3 % — ABNORMAL LOW (ref 39.0–52.0)
Hemoglobin: 11.3 g/dL — ABNORMAL LOW (ref 13.0–17.0)
Lymphs Abs: 0.6 10*3/uL — ABNORMAL LOW (ref 0.7–4.0)
MCHC: 32.9 g/dL (ref 30.0–36.0)
MCV: 82.3 fl (ref 78.0–100.0)
Neutro Abs: 2.9 10*3/uL (ref 1.4–7.7)
RBC: 4.17 Mil/uL — ABNORMAL LOW (ref 4.22–5.81)
RDW: 17 % — ABNORMAL HIGH (ref 11.5–14.6)

## 2012-12-21 LAB — BASIC METABOLIC PANEL
CO2: 28 mEq/L (ref 19–32)
Chloride: 102 mEq/L (ref 96–112)
Glucose, Bld: 93 mg/dL (ref 70–99)
Potassium: 4.1 mEq/L (ref 3.5–5.1)
Sodium: 136 mEq/L (ref 135–145)

## 2012-12-21 LAB — LIPID PANEL
HDL: 46.8 mg/dL (ref >39.00)
LDL Cholesterol: 75 mg/dL (ref 0–99)
Total CHOL/HDL Ratio: 3
VLDL: 7.6 mg/dL (ref 0.0–40.0)

## 2012-12-21 LAB — HEPATIC FUNCTION PANEL
AST: 28 U/L (ref 0–37)
Albumin: 3.8 g/dL (ref 3.5–5.2)
Total Bilirubin: 0.7 mg/dL (ref 0.3–1.2)

## 2012-12-21 NOTE — Progress Notes (Signed)
Quick Note:  Please report to patient. The recent labs are stable. Continue same medication and careful diet. ______ 

## 2012-12-22 ENCOUNTER — Telehealth: Payer: Self-pay | Admitting: *Deleted

## 2012-12-22 NOTE — Telephone Encounter (Signed)
Advised patient of lab results  

## 2012-12-22 NOTE — Telephone Encounter (Signed)
Message copied by Burnell Blanks on Fri Dec 22, 2012  1:12 PM ------      Message from: [name]      Created: Thu Dec 21, 2012  1:02 PM       Please report to patient.  The recent labs are stable. Continue same medication and careful diet. ------

## 2013-01-02 ENCOUNTER — Telehealth: Payer: Self-pay | Admitting: Cardiology

## 2013-01-02 DIAGNOSIS — R079 Chest pain, unspecified: Secondary | ICD-10-CM

## 2013-01-02 NOTE — Telephone Encounter (Signed)
Patient having to use NTG more frequently for chest pains (about every 3 days) and was wondering if he needed to get another Echo. Per wife no increased shortness of breath or radiating pain. Patient recently seen and did not mention to  Dr. Patty Sermons. Will forward to  Dr. Patty Sermons for review

## 2013-01-02 NOTE — Telephone Encounter (Signed)
New message   Pt want to have an echo but want to talk it over with melinda

## 2013-01-02 NOTE — Telephone Encounter (Signed)
To evaluate chest pain we should have him do a two day Lexiscan myoview rather than another echo. He had normal coronaries by cath in 2003.

## 2013-01-03 NOTE — Telephone Encounter (Signed)
Advised wife and send to [facility] for appointment  Instructions reviewed with wife

## 2013-01-23 ENCOUNTER — Ambulatory Visit (HOSPITAL_COMMUNITY): Payer: Medicare Other | Attending: Cardiovascular Disease | Admitting: Radiology

## 2013-01-23 VITALS — BP 134/78 | HR 70 | Ht 72.0 in | Wt 240.0 lb

## 2013-01-23 DIAGNOSIS — F0390 Unspecified dementia without behavioral disturbance: Secondary | ICD-10-CM | POA: Diagnosis not present

## 2013-01-23 DIAGNOSIS — I4891 Unspecified atrial fibrillation: Secondary | ICD-10-CM | POA: Diagnosis not present

## 2013-01-23 DIAGNOSIS — Z87891 Personal history of nicotine dependence: Secondary | ICD-10-CM | POA: Insufficient documentation

## 2013-01-23 DIAGNOSIS — R079 Chest pain, unspecified: Secondary | ICD-10-CM | POA: Insufficient documentation

## 2013-01-23 DIAGNOSIS — Z8249 Family history of ischemic heart disease and other diseases of the circulatory system: Secondary | ICD-10-CM | POA: Insufficient documentation

## 2013-01-23 DIAGNOSIS — E119 Type 2 diabetes mellitus without complications: Secondary | ICD-10-CM | POA: Diagnosis not present

## 2013-01-23 DIAGNOSIS — E785 Hyperlipidemia, unspecified: Secondary | ICD-10-CM | POA: Insufficient documentation

## 2013-01-23 MED ORDER — REGADENOSON 0.4 MG/5ML IV SOLN
0.4000 mg | Freq: Once | INTRAVENOUS | Status: AC
  Administered 2013-01-23: 0.4 mg via INTRAVENOUS

## 2013-01-23 MED ORDER — TECHNETIUM TC 99M SESTAMIBI GENERIC - CARDIOLITE
30.0000 | Freq: Once | INTRAVENOUS | Status: AC | PRN
  Administered 2013-01-23: 30 via INTRAVENOUS

## 2013-01-23 MED ORDER — TECHNETIUM TC 99M SESTAMIBI GENERIC - CARDIOLITE
10.0000 | Freq: Once | INTRAVENOUS | Status: AC | PRN
Start: 2013-01-23 — End: 2013-01-23
  Administered 2013-01-23: 10 via INTRAVENOUS

## 2013-01-23 NOTE — Progress Notes (Signed)
[facility] SITE 3 NUCLEAR MED [address] [place], [place] [address] [phone]    Cardiology Nuclear Med Study  [name] is a 79 y.o. male     MRN : [id number]     DOB: 06/07/1927  Procedure Date: 01/23/2013  Nuclear Med Background Indication for Stress Test:  Evaluation for Ischemia History:  Echo 2013 EF 60-65%, Atrial Fibrillation, Post cath (normal), hx dementia Cardiac Risk Factors: Family History - CAD, History of Smoking, Lipids and NIDDM  Symptoms:  Chest Pain   Nuclear Pre-Procedure Caffeine/Decaff Intake:  None > 12 hrs NPO After: 9:00pm   Lungs:  clear O2 Sat: 97% on room air. IV 0.9% NS with Angio Cath:  22g  IV Site: R Hand x 1, tolerated well IV Started by:  Irean Hong, RN  Chest Size (in):  46 Cup Size: n/a  Height: 6' (1.829 m)  Weight:  240 lb (108.863 kg)  BMI:  Body mass index is 32.54 kg/(m^2). Tech Comments:  Took Lopressor, and Glipizide at 11:45 today.    Nuclear Med Study 1 or 2 day study: 1 day  Stress Test Type:  [name]  Reading MD: Charlton Haws, MD  Order Authorizing Provider:  [name], MD  Resting Radionuclide: Technetium 34m Sestamibi  Resting Radionuclide Dose: 11.0 mCi   Stress Radionuclide:  Technetium 14m Sestamibi  Stress Radionuclide Dose: 33.0 mCi           Stress Protocol Rest HR: 70 Stress HR: 75  Rest BP: 134/78 Stress BP: 140/75  Exercise Time (min): n/a METS: n/a   Predicted Max HR: 137 bpm % Max HR: 54.74 bpm Rate Pressure Product: 21308   Dose of Adenosine (mg):  n/a Dose of Lexiscan: 0.4 mg  Dose of Atropine (mg): n/a Dose of Dobutamine: n/a mcg/kg/min (at max HR)  Stress Test Technologist: Nelson Chimes, BS-ES  Nuclear Technologist:  Harlow Asa, CNMT     Rest Procedure:  Myocardial perfusion imaging was performed at rest 45 minutes following the intravenous administration of Technetium 73m Sestamibi. Rest ECG: Atrial Fibrilliation  Stress Procedure:  The patient received IV  Lexiscan 0.4 mg over 15-seconds.  Technetium 71m Sestamibi injected at 30-seconds.  Quantitative spect images were obtained after a 45 minute delay. No symptoms during the Lexiscan infusion.  Stress ECG: No significant change from baseline ECG  QPS Raw Data Images:  Normal; no motion artifact; normal heart/lung ratio. Stress Images:  Normal homogeneous uptake in all areas of the myocardium. Rest Images:  Normal homogeneous uptake in all areas of the myocardium. Subtraction (SDS):  Normal Transient Ischemic Dilatation (Normal <1.22):  1.02 Lung/Heart Ratio (Normal <0.45):  0.20  Quantitative Gated Spect Images QGS EDV:  n/a QGS ESV:  n/a  Impression Exercise Capacity:  Lexiscan with no exercise. BP Response:  Normal blood pressure response. Clinical Symptoms:  No significant symptoms noted. ECG Impression:  No significant ST segment change suggestive of ischemia. Comparison with Prior Nuclear Study: No images to compare  Overall Impression:  Normal stress nuclear study.  Baseline afib LV appears dilated suggest echo correlation of EF  LV Ejection Fraction: Study not gated.  LV Wall Motion:  Study not gated  [employer]

## 2013-01-24 ENCOUNTER — Telehealth: Payer: Self-pay | Admitting: *Deleted

## 2013-01-24 NOTE — Telephone Encounter (Signed)
Advised wife of myoview results

## 2013-01-24 NOTE — Telephone Encounter (Signed)
Message copied by Burnell Blanks on Wed Jan 24, 2013  2:18 PM ------      Message from: [name]      Created: Wed Jan 24, 2013 12:24 PM       Stress test did not show any ischemia. CSD. ------

## 2013-01-25 ENCOUNTER — Other Ambulatory Visit: Payer: Self-pay

## 2013-01-31 ENCOUNTER — Telehealth: Payer: Self-pay | Admitting: Cardiology

## 2013-01-31 NOTE — Telephone Encounter (Signed)
Spoke with patient's wife who states the patient has redness at the site of the injection from his nuclear stress test done on 11/4.  Wife reports that there is purple/black bruising around the site of the needlestick but that what is concerning to her is the redness at the site.  I advised wife to bring patient to the office for [place] to assess.  Patient's wife states their son, also a patient of Dr. Yevonne Pax, is currently at [facility] for open heart surgery and they are trying to get to the hospital.  I advised that patient could ask one of the health care professionals at the hospital to assess the injection site; otherwise we would be happy to take a look if they come to the office.  Patient's wife states she will ask someone at the hospital to look at it and thanked me for my help.

## 2013-01-31 NOTE — Telephone Encounter (Signed)
New Problem:  Pt states his skin is irrritated from where he had his nuc treadmill.Marland KitchenMarland KitchenPt states the needle site is red and irritated... Please advise

## 2013-02-07 ENCOUNTER — Other Ambulatory Visit: Payer: Self-pay

## 2013-02-07 MED ORDER — METOPROLOL TARTRATE 25 MG PO TABS: 12.5000 mg | ORAL_TABLET | Freq: Every morning | ORAL | Status: DC

## 2013-02-12 ENCOUNTER — Other Ambulatory Visit: Payer: Self-pay

## 2013-02-12 MED ORDER — METOPROLOL TARTRATE 25 MG PO TABS: 12.5000 mg | ORAL_TABLET | Freq: Every morning | ORAL | Status: DC

## 2013-02-16 ENCOUNTER — Telehealth: Payer: Self-pay

## 2013-02-16 NOTE — Telephone Encounter (Signed)
Phone call to patient and left a message letting him know he is due for his Prevnar vaccine. 

## 2013-02-20 ENCOUNTER — Encounter: Payer: Self-pay | Admitting: *Deleted

## 2013-02-20 ENCOUNTER — Telehealth: Payer: Self-pay

## 2013-02-20 DIAGNOSIS — N3281 Overactive bladder: Secondary | ICD-10-CM

## 2013-02-20 MED ORDER — OXYBUTYNIN CHLORIDE ER 15 MG PO TB24: 15.0000 mg | ORAL_TABLET | Freq: Every day | ORAL | Status: DC

## 2013-02-20 NOTE — Telephone Encounter (Signed)
Prescription sent to [facility].

## 2013-02-20 NOTE — Telephone Encounter (Signed)
This encounter was created in error - please disregard.

## 2013-02-20 NOTE — Telephone Encounter (Signed)
Message copied by Noreene Larsson on Tue Feb 20, 2013  2:16 PM ------      Message from: Jacques Navy      Created: Tue Feb 20, 2013  2:14 PM      Regarding: FW: call from wife       Ok to refill medication for patient      ----- Message -----         From: Carmela Hurt, RN         Sent: 02/20/2013  12:15 PM           To: Jacques Navy, MD      Subject: call from wife                                           Dr Debby Bud can you please refill the oxybutynin for this patient. Thanks Addison Lank, RN per request dr Patty Sermons       ------

## 2013-03-05 ENCOUNTER — Other Ambulatory Visit: Payer: Self-pay

## 2013-03-05 MED ORDER — NITROGLYCERIN 0.4 MG SL SUBL: 0.4000 mg | SUBLINGUAL_TABLET | SUBLINGUAL | Status: DC | PRN

## 2013-03-06 ENCOUNTER — Encounter (INDEPENDENT_AMBULATORY_CARE_PROVIDER_SITE_OTHER): Payer: Medicare Other | Admitting: Internal Medicine

## 2013-03-06 ENCOUNTER — Encounter: Payer: Self-pay | Admitting: Internal Medicine

## 2013-03-06 ENCOUNTER — Ambulatory Visit (INDEPENDENT_AMBULATORY_CARE_PROVIDER_SITE_OTHER): Payer: Medicare Other | Admitting: Internal Medicine

## 2013-03-06 VITALS — BP 140/76 | HR 73 | Temp 97.6°F | Wt 247.0 lb

## 2013-03-06 DIAGNOSIS — M7989 Other specified soft tissue disorders: Secondary | ICD-10-CM | POA: Diagnosis not present

## 2013-03-06 DIAGNOSIS — Z23 Encounter for immunization: Secondary | ICD-10-CM | POA: Diagnosis not present

## 2013-03-06 DIAGNOSIS — M7120 Synovial cyst of popliteal space [Baker], unspecified knee: Secondary | ICD-10-CM

## 2013-03-06 DIAGNOSIS — M7121 Synovial cyst of popliteal space [Baker], right knee: Secondary | ICD-10-CM

## 2013-03-06 NOTE — Patient Instructions (Signed)
Swelling of the right calve - by Ultra sound exam this is a Baker's cyst with possible rupture with fluid in the muscles of the calve. There is no blood clot.  Plan Take aleve twice a day to see if this helps the swelling go down. Be sure to watch for abdominal pain  If the swelling doesn't get better and it bothers you we can refer you to a orthopedist.     Baker's Cyst A Baker's cyst is a swelling that forms in the back of the knee. It is a sac-like structure. It is filled with the same fluid that is located in your knee. The fluid located in your knee is necessary because it lubricates the bones and cartilage. It allows them to move over each other more easily. CAUSES  When the knee becomes injured or has soreness (inflammation) present, more fluid forms in the knee. When this happens, the joint lining is pushed out behind the knee and forms the baker's cyst. This cyst may also be caused by inflammation from arthritic conditions and infections. DIAGNOSIS  A Baker's cyst is most often diagnosed with an ultrasound. This is a specialized picture (like an X-ray). It shows a picture by using sound waves. Sometimes a specialized x-ray called an MRI (magnetic resonance imaging) is used. This picks up other problems within a joint if an ultrasound alone cannot make the diagnosis. If the cyst came immediately following an injury, plain x-rays may be used to make a diagnosis. TREATMENT  The treatment depends on the cause of the cyst. But most of these cysts are caused by an inflammation. Anti-inflammatory medications and rest often will get rid of the problem. If the cyst is caused by an infection, medications (antibiotics) will be prescribed to help this. Take the medications as directed. Refer to Home Care Instructions, below, for additional treatment suggestions. HOME CARE INSTRUCTIONS   If the cyst was caused by an injury, for the first 24 hours, while lying down, keep the injured extremity elevated on  2 pillows.  For the first 24 hours while you are awake, apply ice bags (ice in a plastic bag with a towel around it to prevent frostbite to skin) 03-04 times per day for 15-20 minutes to the injured area. Then do as directed by your caregiver.  Only take over-the-counter or prescription medicines for pain, discomfort, or fever as directed by your caregiver. Persistent pain and inability to use the injured area for more than 2 to 3 days are warning signs indicating that you should see a caregiver for a follow-up visit as soon as possible. Persistent pain and swelling indicate that further evaluation, non-weight bearing (use of crutches as instructed), and/or further x-rays are needed. Make a follow-up appointment with your own caregiver. If conservative measures (rest, medications and inactivity) do not help the problem get better, sometimes surgery for removal of the cyst is needed. Reasons for this may be that the cyst is pressing on nerves and/or vessels and causing problems which cannot wait for improvement with conservative treatment. If the problem is caused by injuries to the cartilage in the knee, surgery is often needed for treatment of that problem. MAKE SURE YOU:   Understand these instructions.  Will watch your condition.  Will get help right away if you are not doing well or get worse. Document Released: 03/08/2005 Document Revised: 05/31/2011 Document Reviewed: 10/18/2012 [facility] Patient Information 2014 [place], [place].

## 2013-03-06 NOTE — Progress Notes (Signed)
Subjective:    Patient ID: [name], male    DOB: 1927-12-22, 79 y.o.   MRN: [id number]  HPI [name] presents for evaluation of a swollen right calve. This has been a problem for several days. He has had no travel or prolonged sitting. There is no particular pain.  He has reported dark stools which he attributed to drinking red wine. He stopped the wine and the stools returned to a normal color.   Past Medical History  Diagnosis Date  . Arrhythmia     a fib  . ED (erectile dysfunction)   . Memory disorder   . 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 Hematochezia     has intermittent problem  . Arthritis     DJD knee - left  . Restless leg     treats with heat  . Cancer     bladder cancer - BCG Dr. Darvin Neighbours  . Diabetes mellitus     NIDDM on oral medication  . Genital warts     recurrent. Not a problem  . GERD (gastroesophageal reflux disease)     treated with PPI  . Hypertension     mild  . Neuropathy     burning discomfort feet/legs  . Irritable bladder    Past Surgical History  Procedure Laterality Date  . Cardiovascular stress test  03/28/2003    EF 55%.  Normal cardiolite study  . Appendectomy  1946  . Tonsillectomy  1938  . Joint replacement  1990's    left TKR  . Knee arthroscopy  1980's    right knee   Family History  Problem Relation Age of Onset  . Kidney disease Mother   . Heart disease Mother   . Stroke Mother   . Cancer Father     prostate  . Diabetes Neg Hx   . COPD Neg Hx   . Mental illness Brother    History   Social History  . Marital Status: Married    Spouse Name: N/A    Number of Children: N/A  . Years of Education: N/A   Occupational History  . retired    Social History Main Topics  . Smoking status: Former Smoker    Quit date: 08/28/1975  . Smokeless tobacco: Former Neurosurgeon  . Alcohol Use: 3.5 oz/week    7 drink(s) per week  . Drug Use: No  . Sexual Activity: Not Currently   Other Topics Concern  . Not  on file   Social History Narrative   10th grade. Married '49. 3 sons - '49, 51, 57; 1 dtr - '53. 8 grandchildren; 3 great-grands.  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 Lives with wife - independently. ACP- yes on CPR, OK for short term ventilation; no heroics including artificial hydration or nutrition.     Current Outpatient Prescriptions on File Prior to Visit  Medication Sig Dispense Refill  . acetaminophen (TYLENOL) 325 MG tablet Take 650 mg by mouth every 6 (six) hours as needed for pain or fever.      . COD LIVER OIL PO Take 1 capsule by mouth daily.       . cyclobenzaprine (FLEXERIL) 5 MG tablet Take 1 tablet (5 mg total) by mouth 3 (three) times daily as needed for muscle spasms.  50 tablet  0  . donepezil (ARICEPT) 10 MG tablet Take 10 mg by mouth  at bedtime.      . ferrous sulfate 325 (65 FE) MG EC tablet Take 1 tablet (325 mg total) by mouth 2 (two) times daily.      . folic acid (FOLVITE) 1 MG tablet Take 1 mg by mouth 2 (two) times daily.       . furosemide (LASIX) 40 MG tablet Take 20 mg by mouth every morning.      . gabapentin (NEURONTIN) 300 MG capsule Take 1 capsule (300 mg total) by mouth at bedtime.  90 capsule  3  . glipiZIDE (GLUCOTROL) 5 MG tablet Take 1 tablet (5 mg total) by mouth daily.  90 tablet  3  . memantine (NAMENDA) 10 MG tablet Take 1 tablet (10 mg total) by mouth 2 (two) times daily.  180 tablet  1  . metoprolol tartrate (LOPRESSOR) 25 MG tablet Take 0.5 tablets (12.5 mg total) by mouth every morning.  30 tablet  5  . Multiple Vitamin (MULTIVITAMIN) tablet Take 1 tablet by mouth every morning.       . Multiple Vitamins-Minerals (PRESERVISION AREDS 2) CAPS Take 1 capsule by mouth every morning.      . nitroGLYCERIN (NITROSTAT) 0.4 MG SL tablet Place 1 tablet (0.4 mg total) under the tongue every 5 (five) minutes as needed for chest pain.  100 tablet  0  . oxybutynin (DITROPAN XL) 15 MG 24 hr tablet Take  1 tablet (15 mg total) by mouth daily.  90 tablet  3  . pantoprazole (PROTONIX) 40 MG tablet Take 1 tablet (40 mg total) by mouth daily.  90 tablet  3  . Polyethylene Glycol 3350 (GLYCOLAX PO) Take 1 packet by mouth daily as needed. For constipation      . [DISCONTINUED] oxybutynin (DITROPAN XL) 15 MG 24 hr tablet Take 1 tablet (15 mg total) by mouth daily.  90 tablet  3   No current facility-administered medications on file prior to visit.      Review of Systems System review is negative for any constitutional, cardiac, pulmonary, GI or neuro symptoms or complaints other than as described in the HPI.     Objective:   Physical Exam Filed Vitals:   03/06/13 1652  BP: 140/76  Pulse: 73  Temp: 97.6 F (36.4 C)   Gen'l - Elderly man in good spirits and cooperative HEENT- C&S clear Cor - RRR Pulm - normal respirations. No SOB, no acute dsypnea Ext - right distal LE is swollen, no calve tenderness, negative Homan's sign. Neuro - follows commands. Required 1+ assist to get up to exam table.  LE U/S with doppler (Dr. AVP) - no evidence of DVT; probable ruptured Baker's cyst.        Assessment & Plan:  Baker's cyst - swollen calve due to ruptured Baker's cyst.  Plan Take aleve twice a day to see if this helps the swelling go down. Be sure to watch for abdominal pain  If the swelling doesn't get better and it bothers you we can refer you to a orthopedist.

## 2013-03-06 NOTE — Progress Notes (Signed)
Pre visit review using our clinic review tool, if applicable. No additional management support is needed unless otherwise documented below in the visit note. 

## 2013-03-11 NOTE — Progress Notes (Signed)
Procedure Note :    Procedure :   Point of care (POC) sonography examination. Requested by Dr Debby Bud.   Indication: RLE swelling   Equipment used: Sonosite M-Turbo with HFL38x/13-6 MHz transducer linear probe. The images were stored in the unit and later transferred in storage.  The patient was placed in a decubitus position. 3 point venous compression test is negative.  This study revealed a heteroechoic large lesion posterior to the R knee joint c/w Baker's cyst   Impression: A heteroechoic large lesion posterior to the R knee joint c/w Baker's cyst. No DVT noted.

## 2013-03-26 ENCOUNTER — Telehealth: Payer: Self-pay | Admitting: Internal Medicine

## 2013-03-26 DIAGNOSIS — M660 Rupture of popliteal cyst: Secondary | ICD-10-CM

## 2013-03-26 NOTE — Telephone Encounter (Signed)
Order to [facility] for referral to ortho for ruptured Baker's cyst

## 2013-03-26 NOTE — Telephone Encounter (Signed)
Pt request referral for orthopedist. Please advise.

## 2013-03-28 DIAGNOSIS — IMO0002 Reserved for concepts with insufficient information to code with codable children: Secondary | ICD-10-CM | POA: Diagnosis not present

## 2013-03-28 DIAGNOSIS — M1710 Unilateral primary osteoarthritis, unspecified knee: Secondary | ICD-10-CM | POA: Diagnosis not present

## 2013-04-03 ENCOUNTER — Encounter: Payer: Self-pay | Admitting: Internal Medicine

## 2013-04-03 ENCOUNTER — Ambulatory Visit (INDEPENDENT_AMBULATORY_CARE_PROVIDER_SITE_OTHER): Payer: Medicare Other | Admitting: Internal Medicine

## 2013-04-03 VITALS — BP 148/90 | HR 99 | Temp 96.6°F | Wt 272.0 lb

## 2013-04-03 DIAGNOSIS — I872 Venous insufficiency (chronic) (peripheral): Secondary | ICD-10-CM | POA: Diagnosis not present

## 2013-04-03 DIAGNOSIS — E119 Type 2 diabetes mellitus without complications: Secondary | ICD-10-CM

## 2013-04-03 DIAGNOSIS — R011 Cardiac murmur, unspecified: Secondary | ICD-10-CM

## 2013-04-03 NOTE — Patient Instructions (Signed)
Leg swelling is due to poor circulation - the record reveals normal kidney function and heart function.  Plan - elevate legs several times a day - a rest on the sofa  otc men's support hose, if this fails we can go do Rx support stockings.    Venous Stasis or Chronic Venous Insufficiency Chronic venous insufficiency, also called venous stasis, is a condition that affects the veins in the legs. The condition prevents blood from being pumped through these veins effectively. Blood may no longer be pumped effectively from the legs back to the heart. This condition can range from mild to severe. With proper treatment, you should be able to continue with an active life. CAUSES  Chronic venous insufficiency occurs when the vein walls become stretched, weakened, or damaged or when valves within the vein are damaged. Some common causes of this include:  High blood pressure inside the veins (venous hypertension).  Increased blood pressure in the leg veins from long periods of sitting or standing.  A blood clot that blocks blood flow in a vein (deep vein thrombosis).  Inflammation of a superficial vein (phlebitis) that causes a blood clot to form. RISK FACTORS Various things can make you more likely to develop chronic venous insufficiency, including:  Family history of this condition.  Obesity.  Pregnancy.  Sedentary lifestyle.  Smoking.  Jobs requiring long periods of standing or sitting in one place.  Being a certain age. Women in their 56s and 4s and men in their 73s are more likely to develop this condition. SIGNS AND SYMPTOMS  Symptoms may include:   Varicose veins.  Skin breakdown or ulcers.  Reddened or discolored skin on the leg.  Brown, smooth, tight, and painful skin just above the ankle, usually on the inside surface (lipodermatosclerosis).  Swelling. DIAGNOSIS  To diagnose this condition, your health care provider will take a medical history and do a physical exam.  The following tests may be ordered to confirm the diagnosis:  Duplex ultrasound A procedure that produces a picture of a blood vessel and nearby organs and also provides information on blood flow through the blood vessel.  Plethysmography A procedure that tests blood flow.  A venogram, or venography A procedure used to look at the veins using X-ray and dye. TREATMENT The goals of treatment are to help you return to an active life and to minimize pain or disability. Treatment will depend on the severity of the condition. Medical procedures may be needed for severe cases. Treatment options may include:   Use of compression stockings. These can help with symptoms and lower the chances of the problem getting worse, but they do not cure the problem.  Sclerotherapy A procedure involving an injection of a material that "dissolves" the damaged veins. Other veins in the network of blood vessels take over the function of the damaged veins.  Surgery to remove the vein or cut off blood flow through the vein (vein stripping or laser ablation surgery).  Surgery to repair a valve. HOME CARE INSTRUCTIONS   Wear compression stockings as directed by your health care provider.  Only take over-the-counter or prescription medicines for pain, discomfort, or fever as directed by your health care provider.  Follow up with your health care provider as directed. SEEK MEDICAL CARE IF:   You have redness, swelling, or increasing pain in the affected area.  You see a red streak or line that extends up or down from the affected area.  You have a breakdown or  loss of skin in the affected area, even if the breakdown is small.  You have an injury to the affected area. SEEK IMMEDIATE MEDICAL CARE IF:   You have an injury and open wound in the affected area.  Your pain is severe and does not improve with medicine.  You have sudden numbness or weakness in the foot or ankle below the affected area, or you have  trouble moving your foot or ankle.  You have a fever or persistent symptoms for more than 2 3 days.  You have a fever and your symptoms suddenly get worse. MAKE SURE YOU:   Understand these instructions.  Will watch your condition.  Will get help right away if you are not doing well or get worse. Document Released: 07/12/2006 Document Revised: 12/27/2012 Document Reviewed: 11/13/2012 [facility] Patient Information 2014 [place].

## 2013-04-03 NOTE — Progress Notes (Signed)
Pre visit review using our clinic review tool, if applicable. No additional management support is needed unless otherwise documented below in the visit note. 

## 2013-04-03 NOTE — Progress Notes (Signed)
Subjective:    Patient ID: [name], male    DOB: 06-06-1927, 80 y.o.   MRN: [id number]  HPI [name] was recently seen for swelling in the right calve.In office U/S revealed no DVT but a Baker's cyst. He has subsequently been seen by Dr. Alfonso Ramus at [facility] for aspiration of cyst fluid.   He presents today for LE edema: he awakens with no significant edema but during the course of the day will have considerable swelling. He has had no skin irritation or ulcer formation. He is not having pain nor is he limited in his activity by the edema. Chart reviewed: normal BUN/Cr, normal cardiac function by both 2D echo and Myoview study.  Past Medical History  Diagnosis Date  . Arrhythmia     a fib  . ED (erectile dysfunction)   . Memory disorder   . 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 Hematochezia     has intermittent problem  . Arthritis     DJD knee - left  . Restless leg     treats with heat  . Cancer     bladder cancer - BCG [name]  . Diabetes mellitus     NIDDM on oral medication  . Genital warts     recurrent. Not a problem  . GERD (gastroesophageal reflux disease)     treated with PPI  . Hypertension     mild  . Neuropathy     burning discomfort feet/legs  . Irritable bladder    Past Surgical History  Procedure Laterality Date  . Cardiovascular stress test  03/28/2003    EF 55%.  Normal cardiolite study  . Appendectomy  1946  . Tonsillectomy  1938  . Joint replacement  1990's    left TKR  . Knee arthroscopy  1980's    right knee   Family History  Problem Relation Age of Onset  . Kidney disease Mother   . Heart disease Mother   . Stroke Mother   . Cancer Father     prostate  . Diabetes Neg Hx   . COPD Neg Hx   . Mental illness Brother    History   Social History  . Marital Status: Married    Spouse Name: N/A    Number of Children: N/A  . Years of Education: N/A   Occupational History  . retired    Social History Main  Topics  . Smoking status: Former Smoker    Quit date: 08/28/1975  . Smokeless tobacco: Former Systems developer  . Alcohol Use: 3.5 oz/week    7 drink(s) per week  . Drug Use: No  . Sexual Activity: Not Currently   Other Topics Concern  . Not on file   Social History Narrative   10th grade. Married '49. 3 sons - '49, 51, 57; 1 dtr - '53. 8 grandchildren; 3 great-grands.  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 Lives with wife - independently. ACP- yes on CPR, OK for short term ventilation; no heroics including artificial hydration or nutrition.      Current Outpatient Prescriptions on File Prior to Visit  Medication Sig Dispense Refill  . acetaminophen (TYLENOL) 325 MG tablet Take 650 mg by mouth every 6 (six) hours as needed for pain or fever.      . COD LIVER OIL PO Take 1 capsule by mouth daily.       [name]  cyclobenzaprine (FLEXERIL) 5 MG tablet Take 1 tablet (5 mg total) by mouth 3 (three) times daily as needed for muscle spasms.  50 tablet  0  . donepezil (ARICEPT) 10 MG tablet Take 10 mg by mouth at bedtime.      . ferrous sulfate 325 (65 FE) MG EC tablet Take 1 tablet (325 mg total) by mouth 2 (two) times daily.      . folic acid (FOLVITE) 1 MG tablet Take 1 mg by mouth 2 (two) times daily.       . furosemide (LASIX) 40 MG tablet Take 20 mg by mouth every morning.      . gabapentin (NEURONTIN) 300 MG capsule Take 1 capsule (300 mg total) by mouth at bedtime.  90 capsule  3  . glipiZIDE (GLUCOTROL) 5 MG tablet Take 1 tablet (5 mg total) by mouth daily.  90 tablet  3  . memantine (NAMENDA) 10 MG tablet Take 1 tablet (10 mg total) by mouth 2 (two) times daily.  180 tablet  1  . metoprolol tartrate (LOPRESSOR) 25 MG tablet Take 0.5 tablets (12.5 mg total) by mouth every morning.  30 tablet  5  . Multiple Vitamin (MULTIVITAMIN) tablet Take 1 tablet by mouth every morning.       . Multiple Vitamins-Minerals (PRESERVISION AREDS 2) CAPS Take 1  capsule by mouth every morning.      . nitroGLYCERIN (NITROSTAT) 0.4 MG SL tablet Place 1 tablet (0.4 mg total) under the tongue every 5 (five) minutes as needed for chest pain.  100 tablet  0  . oxybutynin (DITROPAN XL) 15 MG 24 hr tablet Take 1 tablet (15 mg total) by mouth daily.  90 tablet  3  . pantoprazole (PROTONIX) 40 MG tablet Take 1 tablet (40 mg total) by mouth daily.  90 tablet  3  . Polyethylene Glycol 3350 (GLYCOLAX PO) Take 1 packet by mouth daily as needed. For constipation      . [DISCONTINUED] oxybutynin (DITROPAN XL) 15 MG 24 hr tablet Take 1 tablet (15 mg total) by mouth daily.  90 tablet  3   No current facility-administered medications on file prior to visit.       Review of Systems System review is negative for any constitutional, cardiac, pulmonary, GI or neuro symptoms or complaints other than as described in the HPI.     Objective:   Physical Exam Filed Vitals:   04/03/13 1124  BP: 148/90  Pulse: 99  Temp: 96.6 F (35.9 C)   Wt Readings from Last 3 Encounters:  04/03/13 272 lb (123.378 kg)  03/06/13 247 lb (112.038 kg)  01/23/13 240 lb (108.863 kg)   BP Readings from Last 3 Encounters:  04/03/13 148/90  03/06/13 140/76  01/23/13 134/78   Gen'l- Overweight large framed man in no distress COr - 2+ radial pulse, RRR, no JVD, 2+ LE pitting edema to mid-calve Pulm - normal respirations         Assessment & Plan:

## 2013-04-04 ENCOUNTER — Telehealth: Payer: Self-pay

## 2013-04-04 DIAGNOSIS — I872 Venous insufficiency (chronic) (peripheral): Secondary | ICD-10-CM | POA: Insufficient documentation

## 2013-04-04 NOTE — Telephone Encounter (Signed)
Message copied by Shelly Coss on Wed Apr 04, 2013  9:01 AM ------      Message from: Adella Hare E      Created: Wed Apr 04, 2013  5:49 AM       Please call patient - he needs to come to lab at his convenience for monitoring of his diabetes.             Thanks ------

## 2013-04-04 NOTE — Assessment & Plan Note (Signed)
Lab Results  Component Value Date   HGBA1C 5.3 12/14/2011   Due for follow up lab - order entered

## 2013-04-04 NOTE — Assessment & Plan Note (Signed)
Diagnosed Jan '15. No stasis dermatitis or stasis ulcers. Full explanation to patient and his son including cartoon and handout.  Plan Elevated legs  otc support hose.

## 2013-04-04 NOTE — Telephone Encounter (Signed)
Relevant patient education assigned to patient using Emmi. ° °

## 2013-04-04 NOTE — Telephone Encounter (Signed)
Left message on patient's cell stating to come for labwork. Tried home # but no answer.

## 2013-04-26 ENCOUNTER — Telehealth: Payer: Self-pay | Admitting: *Deleted

## 2013-04-26 NOTE — Telephone Encounter (Signed)
Spouse phoned to say that patient had a blood blister on his big toe that he was going to try to lance it himself.  Phoned spouse back and advised her NOT to let patient do that, he's a diabetic, longer healing time, higher infection risks.  Spouse states that she thinks son has already talked him into coming in for OV, which he had originally refused to do. Transferred to scheduling.

## 2013-04-27 ENCOUNTER — Ambulatory Visit (INDEPENDENT_AMBULATORY_CARE_PROVIDER_SITE_OTHER): Payer: Medicare Other | Admitting: Internal Medicine

## 2013-04-27 ENCOUNTER — Encounter: Payer: Self-pay | Admitting: Internal Medicine

## 2013-04-27 VITALS — BP 120/70 | HR 84 | Temp 97.5°F | Wt 259.0 lb

## 2013-04-27 DIAGNOSIS — I4891 Unspecified atrial fibrillation: Secondary | ICD-10-CM | POA: Diagnosis not present

## 2013-04-27 DIAGNOSIS — T148XXA Other injury of unspecified body region, initial encounter: Secondary | ICD-10-CM | POA: Diagnosis not present

## 2013-04-27 DIAGNOSIS — E119 Type 2 diabetes mellitus without complications: Secondary | ICD-10-CM

## 2013-04-27 NOTE — Progress Notes (Signed)
Pre-visit discussion using our clinic review tool. No additional management support is needed unless otherwise documented below in the visit note.  

## 2013-04-27 NOTE — Patient Instructions (Addendum)
We drained the blood from your blood blister in the office today  Keep covered as needed, avoid pulling off the dry skin until it is ready to detach  Call if needed for other concerns or problems Diabetes and Foot Care Diabetes may cause you to have problems because of poor blood supply (circulation) to your feet and legs. This may cause the skin on your feet to become thinner, break easier, and heal more slowly. Your skin may become dry, and the skin may peel and crack. You may also have nerve damage in your legs and feet causing decreased feeling in them. You may not notice minor injuries to your feet that could lead to infections or more serious problems. Taking care of your feet is one of the most important things you can do for yourself.  HOME CARE INSTRUCTIONS  Wear shoes at all times, even in the house. Do not go barefoot. Bare feet are easily injured.  Check your feet daily for blisters, cuts, and redness. If you cannot see the bottom of your feet, use a mirror or ask someone for help.  Wash your feet with warm water (do not use hot water) and mild soap. Then pat your feet and the areas between your toes until they are completely dry. Do not soak your feet as this can dry your skin.  Apply a moisturizing lotion or petroleum jelly (that does not contain alcohol and is unscented) to the skin on your feet and to dry, brittle toenails. Do not apply lotion between your toes.  Trim your toenails straight across. Do not dig under them or around the cuticle. File the edges of your nails with an emery board or nail file.  Do not cut corns or calluses or try to remove them with medicine.  Wear clean socks or stockings every day. Make sure they are not too tight. Do not wear knee-high stockings since they may decrease blood flow to your legs.  Wear shoes that fit properly and have enough cushioning. To break in new shoes, wear them for just a few hours a day. This prevents you from injuring your  feet. Always look in your shoes before you put them on to be sure there are no objects inside.  Do not cross your legs. This may decrease the blood flow to your feet.  If you find a minor scrape, cut, or break in the skin on your feet, keep it and the skin around it clean and dry. These areas may be cleansed with mild soap and water. Do not cleanse the area with peroxide, alcohol, or iodine.  When you remove an adhesive bandage, be sure not to damage the skin around it.  If you have a wound, look at it several times a day to make sure it is healing.  Do not use heating pads or hot water bottles. They may burn your skin. If you have lost feeling in your feet or legs, you may not know it is happening until it is too late.  Make sure your health care provider performs a complete foot exam at least annually or more often if you have foot problems. Report any cuts, sores, or bruises to your health care provider immediately. SEEK MEDICAL CARE IF:   You have an injury that is not healing.  You have cuts or breaks in the skin.  You have an ingrown nail.  You notice redness on your legs or feet.  You feel burning or tingling in your  legs or feet.  You have pain or cramps in your legs and feet.  Your legs or feet are numb.  Your feet always feel cold. SEEK IMMEDIATE MEDICAL CARE IF:   There is increasing redness, swelling, or pain in or around a wound.  There is a red line that goes up your leg.  Pus is coming from a wound.  You develop a fever or as directed by your health care provider.  You notice a bad smell coming from an ulcer or wound. Document Released: 03/05/2000 Document Revised: 11/08/2012 Document Reviewed: 08/15/2012 [facility] Patient Information 2014 [place].

## 2013-04-27 NOTE — Progress Notes (Signed)
Subjective:    Patient ID: [name], male    DOB: [birth year], 80 y.o.   MRN: [id number]  HPI  Patient here for Blood blister on left toe Onset as clear blister yesterday a.m., precipitated by wearing new tight shoes for 24 hours Denies pain or injury Clear blister has progressed in size and became blood discolored overnight  Also reviewed chronic medical issues    Past Medical History  Diagnosis Date  . Arrhythmia     a fib  . ED (erectile dysfunction)   . Memory disorder   . 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 Hematochezia     has intermittent problem  . Arthritis     DJD knee - left  . Restless leg     treats with heat  . Cancer     bladder cancer - BCG [name]  . Diabetes mellitus     NIDDM on oral medication  . Genital warts     recurrent. Not a problem  . GERD (gastroesophageal reflux disease)     treated with PPI  . Hypertension     mild  . Neuropathy     burning discomfort feet/legs  . Irritable bladder     Review of Systems  Constitutional: Negative for fever and fatigue.  Musculoskeletal: Negative for arthralgias, back pain and joint swelling.  Skin: Positive for color change (see history of present illness).       Objective:   Physical Exam  BP 120/70  Pulse 84  Temp(Src) 97.5 F (36.4 C) (Oral)  Wt 259 lb (117.482 kg)  SpO2 96% Wt Readings from Last 3 Encounters:  04/27/13 259 lb (117.482 kg)  04/03/13 272 lb (123.378 kg)  03/06/13 247 lb (112.038 kg)    Constitutional: he appears well-developed and well-nourished. No distress. son at side Neck: Normal range of motion. Neck supple. No JVD present. No thyromegaly present.  Cardiovascular: Normal rate, regular rhythm and normal heart sounds.  No murmur heard. No BLE edema. Pulmonary/Chest: Effort normal and breath sounds normal. No respiratory distress. he has no wheezes.  Skin: large uncomplicated blood blister cover 2/3 of L great toe, tip and plantar pad  surface - no soft tissue swelling, no bruising or erythema. No joint effusion. Nontender to touch. No other skin breakdown, callus, ulceration or wound Psychiatric: he has a normal mood and affect. His behavior is normal. Judgment and thought content normal.   Lab Results  Component Value Date   WBC 4.0 Repeated and verified X2.* 12/20/2012   HGB 11.3* 12/20/2012   HCT 34.3* 12/20/2012   PLT 143.0* 12/20/2012   GLUCOSE 93 12/20/2012   CHOL 129 12/20/2012   TRIG 38.0 12/20/2012   HDL 46.80 12/20/2012   LDLCALC 75 12/20/2012   ALT 15 12/20/2012   AST 28 12/20/2012   NA 136 12/20/2012   K 4.1 12/20/2012   CL 102 12/20/2012   CREATININE 0.8 12/20/2012   BUN 9 12/20/2012   CO2 28 12/20/2012   INR 1.31 07/22/2009   HGBA1C 5.3 12/14/2011    Dg Abd 1 View  11/18/2012   *RADIOLOGY REPORT*  Clinical Data: Constipation.  Fever.  Lower abdominal pain.  ABDOMEN - 1 VIEW  Comparison: None.  Findings: Nonobstructive bowel gas pattern is present.  Stool and bowel gas is present in the rectosigmoid.  No dilated loops of large or small bowel.  Likely atherosclerotic calcification present in the left upper quadrant.  DISH  is present.  Chronic right-sided L4 compression fracture and lower lumbar spondylosis noted. Phleboliths in the pelvis.  IMPRESSION: No acute abnormality.   Original Report Authenticated By: Dereck Ligas, M.D.   Ct Abdomen Pelvis W Contrast  11/18/2012   *RADIOLOGY REPORT*  Clinical Data: Fever and abdominal rigidity.  History of prior appendectomy.  CT ABDOMEN AND PELVIS WITH CONTRAST  Technique:  Multidetector CT imaging of the abdomen and pelvis was performed following the standard protocol during bolus administration of intravenous contrast.  Contrast: 39mL OMNIPAQUE IOHEXOL 300 MG/ML  SOLN, 171mL OMNIPAQUE IOHEXOL 300 MG/ML  SOLN  Comparison: 10/29/2008  Findings: The visualized lung bases show a small right pleural effusion. The liver demonstrates potentially subtle early cirrhotic changes without  evidence of mass or biliary ductal dilatation.  The gallbladder is contracted.  The spleen, pancreas, adrenal glands and kidneys are unremarkable. Bowel shows moderate fecal material in the proximal colon without evidence of bowel obstruction or perforation.  No abnormal fluid collection or abscess is seen.  Atherosclerotic plaque is identified in the abdominal aorta and iliac arteries without evidence of aneurysmal disease.  There is no evidence of hernia, mass or enlarged lymph node.  The bladder is decompressed with delayed imaging showing some early contrast excretion.  Degenerative changes are present in the spine.  IMPRESSION: No acute findings.  A small right pleural effusion, potential early cirrhotic changes of the liver and moderate fecal material in the proximal colon are noted.   Original Report Authenticated By: Aletta Edouard, M.D.       Assessment & Plan:   Blood blister - great L toe -  Induced by wearing tight new shoes for 24 hours (Traumatic, friction)  Drained with sterile 21g puncture in office today, instructed to leave skin attached Instructed on care to avoid removing skin until independently ready to separate  To watch for secondary signs of infection  Education care on DM foot exam provided  Reassurance and education provided

## 2013-04-27 NOTE — Assessment & Plan Note (Signed)
Not on anticoagulation because of history of GI bleeds

## 2013-04-27 NOTE — Assessment & Plan Note (Signed)
Lab Results  Component Value Date   HGBA1C 5.3 12/14/2011   The current medical regimen is effective;  continue present plan and medications.

## 2013-05-14 ENCOUNTER — Encounter: Payer: Self-pay | Admitting: Cardiology

## 2013-05-14 ENCOUNTER — Ambulatory Visit (INDEPENDENT_AMBULATORY_CARE_PROVIDER_SITE_OTHER): Payer: Medicare Other | Admitting: Cardiology

## 2013-05-14 VITALS — BP 149/67 | HR 72 | Ht 72.0 in | Wt 258.0 lb

## 2013-05-14 DIAGNOSIS — I059 Rheumatic mitral valve disease, unspecified: Secondary | ICD-10-CM

## 2013-05-14 DIAGNOSIS — I340 Nonrheumatic mitral (valve) insufficiency: Secondary | ICD-10-CM

## 2013-05-14 DIAGNOSIS — F0390 Unspecified dementia without behavioral disturbance: Secondary | ICD-10-CM | POA: Diagnosis not present

## 2013-05-14 DIAGNOSIS — Z79899 Other long term (current) drug therapy: Secondary | ICD-10-CM

## 2013-05-14 DIAGNOSIS — R609 Edema, unspecified: Secondary | ICD-10-CM

## 2013-05-14 DIAGNOSIS — I872 Venous insufficiency (chronic) (peripheral): Secondary | ICD-10-CM

## 2013-05-14 DIAGNOSIS — I4891 Unspecified atrial fibrillation: Secondary | ICD-10-CM | POA: Diagnosis not present

## 2013-05-14 MED ORDER — LOSARTAN POTASSIUM 50 MG PO TABS: 50.0000 mg | ORAL_TABLET | Freq: Every day | ORAL | Status: DC

## 2013-05-14 MED ORDER — FUROSEMIDE 40 MG PO TABS: 40.0000 mg | ORAL_TABLET | Freq: Every morning | ORAL | Status: DC

## 2013-05-14 NOTE — Assessment & Plan Note (Signed)
The patient's dementia is worse despite being on Namenda and Aricept.  The family would like him to see a neurologist and we will arrange for that.

## 2013-05-14 NOTE — Patient Instructions (Addendum)
Will obtain labs today and call you with the results (cbc/bmet)  INCREASE YOU LASIX (FUROSEMIDE) TO 40 MG EVERY DAY  START LOSARTAN 84 MG DAILY   Your physician has requested that you have an echocardiogram. Echocardiography is a painless test that uses sound waves to create images of your heart. It provides your doctor with information about the size and shape of your heart and how well your heart's chambers and valves are working. This procedure takes approximately one hour. There are no restrictions for this procedure.  Return in 7-10 days for bmet (ok to get echo at same time)  [place] OFFICE TO GET APPOINTMENT   Your physician recommends that you schedule a follow-up appointment in: 4 month ov

## 2013-05-14 NOTE — Assessment & Plan Note (Signed)
The patient has atrial fibrillation with controlled ventricular response.  He is in permanent atrial fibrillation.  He is not anticoagulated because of recurrent GI bleeds.  He has not been having any TIA or stroke symptoms

## 2013-05-14 NOTE — Progress Notes (Signed)
[name] Date of Birth:  July 10, 1927 [address] [place], [place]  [address] [phone]         Fax   [phone]  History of Present Illness: This pleasant 80 year old gentleman is seen for a scheduled followup office visit. He has a past history of chronic established atrial fibrillation. He is not on Coumadin because of recurrent GI bleeds. He also has problems with exogenous obesity and diabetes. .  He had a cardiac catheterization on 08/07/01 showing normal coronary arteries and an ejection fraction of 60-65%.  He had an echocardiogram on 04/21/11 showing an ejection fraction of 60-65%, moderate mitral regurgitation, mild aortic insufficiency, moderate tricuspid regurgitation, and pulmonary artery pressure was 46 mmHg. since we last saw him he has had more problems with peripheral edema and weight gain.  His weight is up 20 pounds since last visit.  Is not having any paroxysmal nocturnal dyspnea or nocturia.  He states that his peripheral edema goes down significantly overnight   Current Outpatient Prescriptions  Medication Sig Dispense Refill  . acetaminophen (TYLENOL) 325 MG tablet Take 650 mg by mouth every 6 (six) hours as needed for pain or fever.      . COD LIVER OIL PO Take 1 capsule by mouth daily.       . cyclobenzaprine (FLEXERIL) 5 MG tablet Take 1 tablet (5 mg total) by mouth 3 (three) times daily as needed for muscle spasms.  50 tablet  0  . donepezil (ARICEPT) 10 MG tablet Take 10 mg by mouth at bedtime.      . ferrous sulfate 325 (65 FE) MG EC tablet Take 1 tablet (325 mg total) by mouth 2 (two) times daily.      . folic acid (FOLVITE) 1 MG tablet Take 1 mg by mouth 2 (two) times daily.       . furosemide (LASIX) 40 MG tablet Take 1 tablet (40 mg total) by mouth every morning.  90 tablet  3  . glipiZIDE (GLUCOTROL) 5 MG tablet Take 1 tablet (5 mg total) by mouth daily.  90 tablet  3  . memantine (NAMENDA) 10 MG tablet Take 1 tablet (10 mg total) by  mouth 2 (two) times daily.  180 tablet  1  . metoprolol tartrate (LOPRESSOR) 25 MG tablet Take 0.5 tablets (12.5 mg total) by mouth every morning.  30 tablet  5  . Multiple Vitamin (MULTIVITAMIN) tablet Take 1 tablet by mouth every morning.       . Multiple Vitamins-Minerals (PRESERVISION AREDS 2) CAPS Take 1 capsule by mouth every morning.      . nitroGLYCERIN (NITROSTAT) 0.4 MG SL tablet Place 1 tablet (0.4 mg total) under the tongue every 5 (five) minutes as needed for chest pain.  100 tablet  0  . oxybutynin (DITROPAN XL) 15 MG 24 hr tablet Take 1 tablet (15 mg total) by mouth daily.  90 tablet  3  . pantoprazole (PROTONIX) 40 MG tablet Take 1 tablet (40 mg total) by mouth daily.  90 tablet  3  . Polyethylene Glycol 3350 (GLYCOLAX PO) Take 1 packet by mouth daily as needed. For constipation      . gabapentin (NEURONTIN) 300 MG capsule Take 1 capsule (300 mg total) by mouth at bedtime.  90 capsule  3  . losartan (COZAAR) 50 MG tablet Take 1 tablet (50 mg total) by mouth daily.  90 tablet  3  . [DISCONTINUED] oxybutynin (DITROPAN XL) 15 MG 24 hr tablet  Take 1 tablet (15 mg total) by mouth daily.  90 tablet  3   No current facility-administered medications for this visit.    Allergies  Allergen Reactions  . Cephalexin Other (See Comments)    Unknown reaction  . Furacin [Nitrofurazone] Other (See Comments)    Unknown reaction  . Macrodantin Other (See Comments)    Unknown reaction  . Zestril [Lisinopril] Other (See Comments)    Unknown reaction    Patient Active Problem List   Diagnosis Date Noted  . Atrial fibrillation 09/01/2010    Priority: High  . Diabetes mellitus 09/01/2010    Priority: High  . Dementia 09/01/2010    Priority: High  . Chronic venous insufficiency 04/04/2013  . Weight loss, non-intentional 12/20/2012  . Low back pain 11/17/2012  . Mitral regurgitation 08/26/2011  . Heart murmur 04/15/2011  . Iron deficiency anemia due to chronic blood loss 09/01/2010  .  Benign hypertensive heart disease without heart failure 09/01/2010    History  Smoking status  . Former Smoker  . Quit date: 08/28/1975  Smokeless tobacco  . Former User    History  Alcohol Use  . 3.5 oz/week  . 7 drink(s) per week    Family History  Problem Relation Age of Onset  . Kidney disease Mother   . Heart disease Mother   . Stroke Mother   . Cancer Father     prostate  . Diabetes Neg Hx   . COPD Neg Hx   . Mental illness Brother     Review of Systems: Constitutional: no fever chills diaphoresis or fatigue or change in weight.  Head and neck: no hearing loss, no epistaxis, no photophobia or visual disturbance. Respiratory: No cough, shortness of breath or wheezing. Cardiovascular: No chest pain peripheral edema, palpitations. Gastrointestinal: No abdominal distention, no abdominal pain, no change in bowel habits hematochezia or melena. Genitourinary: No dysuria, no frequency, no urgency, no nocturia. Musculoskeletal:No arthralgias, no back pain, no gait disturbance or myalgias. Neurological: No dizziness, no headaches, no numbness, no seizures, no syncope, no weakness, no tremors. Hematologic: No lymphadenopathy, no easy bruising. Psychiatric: No confusion, no hallucinations, no sleep disturbance.    Physical Exam: Filed Vitals:   05/14/13 1523  BP: 149/67  Pulse: 72   the general appearance is that of a large middle-aged gentleman in no distress.  He has lost 5 pounds since last visit.The head and neck exam reveals pupils equal and reactive.  Extraocular movements are full.  There is no scleral icterus.  The mouth and pharynx are normal.  The neck is supple.  The carotids reveal no bruits.  The jugular venous pressure is normal.  The  thyroid is not enlarged.  There is no lymphadenopathy.  The chest is clear to percussion and auscultation.  There are no rales or rhonchi.  Expansion of the chest is symmetrical.  The precordium is quiet.  The first heart sound  is normal.  The second heart sound is physiologically split.  There is no  gallop rub or click.  There is a grade 2/6 systolic murmur at the left sternal edge.  There is no abnormal lift or heave.  The abdomen is soft and nontender.  The bowel sounds are normal.  The liver and spleen are not enlarged.  There are no abdominal masses.  There are no abdominal bruits.  Extremities reveal good pedal pulses.  There is severe bilateral peripheral edema  There is no cyanosis or clubbing.  Strength is normal  and symmetrical in all extremities.  There is no lateralizing weakness.  There are no sensory deficits.  The skin is warm and dry.  There is no rash.  EKG shows atrial fibrillation with controlled ventricular response.  Occasional PVCs are seen   Assessment / Plan: His weight is up 20 pounds.  We are increasing Lasix and adding losartan.  He will continue to elevate his legs whenever possible. We are checking a CBC and a basal metabolic panel today. The patient will return in about 7-10 days for a followup basal metabolic panel after starting ARB.  We will also get an echocardiogram that day Referral to neurology is being made for evaluation of his dementia. Recheck here in 4 months for followup office visit.

## 2013-05-14 NOTE — Assessment & Plan Note (Signed)
Severe bilateral pretibial edema.  We are increasing Lasix and adding ARB

## 2013-05-14 NOTE — Assessment & Plan Note (Signed)
The patient is having worsening symptoms of fluid retention and peripheral edema.  He has known mitral regurgitation.  We will add losartan 50 mg one daily and we will increase his Lasix from 20 mg to 40 mg daily.  We will have him return in 7-10 days for a followup basal metabolic panel.  Renal function in the past has been normal.

## 2013-05-15 LAB — CBC WITH DIFFERENTIAL/PLATELET
BASOS ABS: 0 10*3/uL (ref 0.0–0.1)
Basophils Relative: 0.4 % (ref 0.0–3.0)
EOS PCT: 4 % (ref 0.0–5.0)
Eosinophils Absolute: 0.2 10*3/uL (ref 0.0–0.7)
HEMATOCRIT: 29.1 % — AB (ref 39.0–52.0)
HEMOGLOBIN: 9 g/dL — AB (ref 13.0–17.0)
LYMPHS ABS: 0.6 10*3/uL — AB (ref 0.7–4.0)
Lymphocytes Relative: 12.1 % (ref 12.0–46.0)
MCHC: 31 g/dL (ref 30.0–36.0)
MCV: 85.8 fl (ref 78.0–100.0)
MONOS PCT: 12.4 % — AB (ref 3.0–12.0)
Monocytes Absolute: 0.6 10*3/uL (ref 0.1–1.0)
NEUTROS ABS: 3.6 10*3/uL (ref 1.4–7.7)
Neutrophils Relative %: 71.1 % (ref 43.0–77.0)
Platelets: 145 10*3/uL — ABNORMAL LOW (ref 150.0–400.0)
RBC: 3.39 Mil/uL — ABNORMAL LOW (ref 4.22–5.81)
RDW: 17.1 % — AB (ref 11.5–14.6)
WBC: 5.1 10*3/uL (ref 4.5–10.5)

## 2013-05-15 LAB — BASIC METABOLIC PANEL
BUN: 14 mg/dL (ref 6–23)
CO2: 30 meq/L (ref 19–32)
Calcium: 9 mg/dL (ref 8.4–10.5)
Chloride: 103 mEq/L (ref 96–112)
Creatinine, Ser: 0.8 mg/dL (ref 0.4–1.5)
GFR: 95.12 mL/min (ref >60.00)
GLUCOSE: 100 mg/dL — AB (ref 70–99)
POTASSIUM: 4.2 meq/L (ref 3.5–5.1)
SODIUM: 137 meq/L (ref 135–145)

## 2013-05-15 NOTE — Progress Notes (Signed)
Quick Note:  Please report to patient. The recent labs are stable. Continue same medication and careful diet. Hemoglobin down slightly 9.0. Kidney function normal. ______

## 2013-05-16 ENCOUNTER — Telehealth: Payer: Self-pay | Admitting: Cardiology

## 2013-05-16 DIAGNOSIS — D649 Anemia, unspecified: Secondary | ICD-10-CM

## 2013-05-16 NOTE — Telephone Encounter (Signed)
Advised wife, will recheck cbc at echo appt

## 2013-05-16 NOTE — Telephone Encounter (Signed)
Message copied by Earvin Hansen on Wed May 16, 2013  3:09 PM ------      Message from: Darlin Coco      Created: Tue May 15, 2013  6:37 PM       Please report to patient.  The recent labs are stable. Continue same medication and careful diet. Hemoglobin down slightly 9.0.  Kidney function normal. ------

## 2013-05-16 NOTE — Telephone Encounter (Signed)
New Problem:  Pt is requesting a call back from [place].

## 2013-05-17 ENCOUNTER — Ambulatory Visit: Payer: Self-pay | Admitting: Neurology

## 2013-05-28 ENCOUNTER — Encounter: Payer: Self-pay | Admitting: Neurology

## 2013-05-28 ENCOUNTER — Ambulatory Visit (INDEPENDENT_AMBULATORY_CARE_PROVIDER_SITE_OTHER): Payer: Medicare Other | Admitting: Neurology

## 2013-05-28 ENCOUNTER — Encounter (INDEPENDENT_AMBULATORY_CARE_PROVIDER_SITE_OTHER): Payer: Self-pay

## 2013-05-28 VITALS — BP 131/63 | HR 107 | Ht 70.0 in | Wt 261.5 lb

## 2013-05-28 DIAGNOSIS — F0390 Unspecified dementia without behavioral disturbance: Secondary | ICD-10-CM

## 2013-05-28 DIAGNOSIS — R269 Unspecified abnormalities of gait and mobility: Secondary | ICD-10-CM | POA: Diagnosis not present

## 2013-05-28 NOTE — Progress Notes (Signed)
PATIENT: [name] DOB: [birth year]  HISTORICAL  [name] is a 80 yo RH referred by cardiologist [name], Primary care is Dr. Timoteo Gaul. For evaluation of memory trouble, gait difficulty, he is accompanied by his wife, and daughter at today's clinical visit  He had a past medical history of chronic atrial fibrillation, was on Coumadin treatment, but developed recurrent GI bleeding, from reviewing the chart, he is not even on anticoagulation treatment, he also had past medical history of obesity, sedentary lifestyle, diabetes,  Family noticed he had gradual onset memory trouble over the past few years, has been taking Aricept 10 mg every day, he also has red onset gait difficulty, used to cane for 3 years, he has no significant low back pain, or neck pain, recent 12 months, since 2014, he has worsening gait difficulty, urinary incontinence, urgency, worsening memory trouble, he tends to repeat himself, forget familiar people's name, forgot to turn off stove, he has quit driving for a few months now,  He has bilateral lower extremity edema, recent echocardiogram showed ejection fraction 60-65%, moderate mitral regurgitation, mild aortic stenosis   He went to school for 10 years, was a Armed forces operational officer, was able to retired at age 27, traveled all over the world, but has become sedentary over the past 10 years, he still enjoys playing his computer games    REVIEW OF SYSTEMS: Full 14 system review of systems performed and notable only for gait difficulty, bilateral lower extremity edema   ALLERGIES: Allergies  Allergen Reactions  . Cephalexin Other (See Comments)    Unknown reaction  . Furacin [Nitrofurazone] Other (See Comments)    Unknown reaction  . Macrodantin Other (See Comments)    Unknown reaction  . Zestril [Lisinopril] Other (See Comments)    Unknown reaction    HOME MEDICATIONS: Current Outpatient Prescriptions on File Prior to Visit  Medication Sig Dispense Refill    . acetaminophen (TYLENOL) 325 MG tablet Take 650 mg by mouth every 6 (six) hours as needed for pain or fever.      . COD LIVER OIL PO Take 1 capsule by mouth daily.       [name] donepezil (ARICEPT) 10 MG tablet Take 10 mg by mouth at bedtime.      . ferrous sulfate 325 (65 FE) MG EC tablet Take 1 tablet (325 mg total) by mouth 2 (two) times daily.      . folic acid (FOLVITE) 1 MG tablet Take 1 mg by mouth 2 (two) times daily.       . furosemide (LASIX) 40 MG tablet Take 1 tablet (40 mg total) by mouth every morning.  90 tablet  3  . glipiZIDE (GLUCOTROL) 5 MG tablet Take 1 tablet (5 mg total) by mouth daily.  90 tablet  3  . memantine (NAMENDA) 10 MG tablet Take 1 tablet (10 mg total) by mouth 2 (two) times daily.  180 tablet  1  . metoprolol tartrate (LOPRESSOR) 25 MG tablet Take 0.5 tablets (12.5 mg total) by mouth every morning.  30 tablet  5  . Multiple Vitamin (MULTIVITAMIN) tablet Take 1 tablet by mouth every morning.       . Multiple Vitamins-Minerals (PRESERVISION AREDS 2) CAPS Take 1 capsule by mouth every morning.      . nitroGLYCERIN (NITROSTAT) 0.4 MG SL tablet Place 1 tablet (0.4 mg total) under the tongue every 5 (five) minutes as needed for chest pain.  100 tablet  0  . oxybutynin (  DITROPAN XL) 15 MG 24 hr tablet Take 1 tablet (15 mg total) by mouth daily.  90 tablet  3  . pantoprazole (PROTONIX) 40 MG tablet Take 1 tablet (40 mg total) by mouth daily.  90 tablet  3  . Polyethylene Glycol 3350 (GLYCOLAX PO) Take 1 packet by mouth daily as needed. For constipation      . gabapentin (NEURONTIN) 300 MG capsule Take 1 capsule (300 mg total) by mouth at bedtime.  90 capsule  3  . [DISCONTINUED] oxybutynin (DITROPAN XL) 15 MG 24 hr tablet Take 1 tablet (15 mg total) by mouth daily.  90 tablet  3     PAST MEDICAL HISTORY: Past Medical History  Diagnosis Date  . Arrhythmia     a fib  . ED (erectile dysfunction)   . Memory disorder   . Anemia     iron deficiency secondary to bleeding   . Hematochezia     has intermittent problem  . Arthritis     DJD knee - left  . Restless leg     treats with heat  . Cancer     bladder cancer - [facility] [name]  . Diabetes mellitus     NIDDM on oral medication  . Genital warts     recurrent. Not a problem  . GERD (gastroesophageal reflux disease)     treated with PPI  . Hypertension     mild  . Neuropathy     burning discomfort feet/legs  . Irritable bladder     PAST SURGICAL HISTORY: Past Surgical History  Procedure Laterality Date  . Cardiovascular stress test  03/28/2003    EF 55%.  Normal cardiolite study  . Appendectomy  1946  . Tonsillectomy  1938  . Joint replacement  1990's    left TKR  . Knee arthroscopy  1980's    right knee    FAMILY HISTORY: Family History  Problem Relation Age of Onset  . Kidney disease Mother   . Heart disease Mother   . Stroke Mother   . Cancer Father     prostate  . Diabetes Neg Hx   . COPD Neg Hx   . Mental illness Brother     SOCIAL HISTORY:  History   Social History  . Marital Status: Married    Spouse Name: [name]    Number of Children: 4  . Years of Education: 10th    Occupational History  . retired    Social History Main Topics  . Smoking status: Former Smoker    Quit date: 08/28/1975  . Smokeless tobacco: Former Systems developer  . Alcohol Use: 4.2 oz/week    7 Shots of liquor per week  . Drug Use: No  . Sexual Activity: Not Currently   Other Topics Concern  . Not on file   Social History Narrative   Patient lives at home with his wife [name]) Married 40 years.   Retired   Education 10 th grade   Right handed   Caffeine one or two daily      PHYSICAL EXAM   Filed Vitals:   05/28/13 1523  BP: 131/63  Pulse: 107  Height: _0  (1.778 m)  Weight: 261 lb 8 oz (118.616 kg)    Body mass index is 37.52 kg/(m^2).   Generalized: In no acute distress  Neck: Supple, no carotid bruits   Cardiac: Regular rate rhythm  Pulmonary: Clear to auscultation  bilaterally  Musculoskeletal: No deformity  Neurological examination  Mentation: Alert oriented to time, place, history taking, and causual conversation, Mini-Mental Status Examination is 26 out of 30, he is not oriented to time, year, month, missed 1 out of 3 recalls   Cranial nerve II-XII: Pupils were equal round reactive to light. Extraocular movements were full.  Visual field were full on confrontational test. Bilateral fundi were sharp.  Facial sensation and strength were normal. Hearing was intact to finger rubbing bilaterally. Uvula tongue midline.  Head turning and shoulder shrug and were normal and symmetric.Tongue protrusion into cheek strength was normal.  Motor:  bilateral lower extremity pitting edema, mild bilateral ankle dorsiflexion, mild bilateral hip flexor weakness   Sensory: length dependent decreased pinprick to midshin level, absent toe vibratory sensation,    Coordination: Normal finger to nose, heel-to-shin bilaterally there was no truncal ataxia  Gait: Rising up from seated position by pushing on chair arm, cautious, wide based unsteady, with bilateral foot drop  Romberg signs: Negative  Deep tendon reflexes: Brachioradialis 2/2, biceps 2/2, triceps 2/2, patellar absent, Achilles absent, plantar responses were flexor bilaterally.   DIAGNOSTIC DATA (LABS, IMAGING, TESTING) - I reviewed patient records, labs, notes, testing and imaging myself where available.  Lab Results  Component Value Date   WBC 5.1 05/14/2013   HGB 9.0* 05/14/2013   HCT 29.1* 05/14/2013   MCV 85.8 05/14/2013   PLT 145.0* 05/14/2013      Component Value Date/Time   NA 137 05/14/2013 1633   K 4.2 05/14/2013 1633   CL 103 05/14/2013 1633   CO2 30 05/14/2013 1633   GLUCOSE 100* 05/14/2013 1633   BUN 14 05/14/2013 1633   CREATININE 0.8 05/14/2013 1633   CALCIUM 9.0 05/14/2013 1633   PROT 6.9 12/20/2012 1622   ALBUMIN 3.8 12/20/2012 1622   AST 28 12/20/2012 1622   ALT 15 12/20/2012 1622   ALKPHOS  109 12/20/2012 1622   BILITOT 0.7 12/20/2012 1622   GFRNONAA >60 07/22/2009 1920   GFRAA  Value: >60        The eGFR has been calculated using the MDRD equation. This calculation has not been validated in all clinical situations. eGFR's persistently <60 mL/min signify possible Chronic Kidney Disease. 07/22/2009 1920   Lab Results  Component Value Date   CHOL 129 12/20/2012   HDL 46.80 12/20/2012   LDLCALC 75 12/20/2012   TRIG 38.0 12/20/2012   CHOLHDL 3 12/20/2012   Lab Results  Component Value Date   HGBA1C 5.3 12/14/2011   Lab Results  Component Value Date   ZGYFVCBS49 675 02/27/2009    ASSESSMENT AND PLAN  KERRON SEDANO is a 80 y.o. male    presenting with gradual onset memory trouble, gait difficulty, with distal leg weakness, hyporeflexia,  1. differentiation diagnosis including lumbar stenosis for his gait difficulty, central nervous system degenerative disorder for his memory trouble 2 laboratory evaluations 3 return to clinic in one month 4 home physical therapy.    Marcial Pacas, M.D. Ph.D.  [facility] [address] [place], [place] [address] [phone]

## 2013-05-30 ENCOUNTER — Other Ambulatory Visit (INDEPENDENT_AMBULATORY_CARE_PROVIDER_SITE_OTHER): Payer: Medicare Other | Admitting: *Deleted

## 2013-05-30 ENCOUNTER — Ambulatory Visit (HOSPITAL_COMMUNITY): Payer: Medicare Other | Attending: Cardiology | Admitting: Cardiology

## 2013-05-30 DIAGNOSIS — I059 Rheumatic mitral valve disease, unspecified: Secondary | ICD-10-CM

## 2013-05-30 DIAGNOSIS — D649 Anemia, unspecified: Secondary | ICD-10-CM | POA: Diagnosis not present

## 2013-05-30 DIAGNOSIS — I340 Nonrheumatic mitral (valve) insufficiency: Secondary | ICD-10-CM

## 2013-05-30 DIAGNOSIS — I4891 Unspecified atrial fibrillation: Secondary | ICD-10-CM | POA: Diagnosis not present

## 2013-05-30 LAB — CBC WITH DIFFERENTIAL/PLATELET
BASOS PCT: 0.5 % (ref 0.0–3.0)
Basophils Absolute: 0 10*3/uL (ref 0.0–0.1)
EOS PCT: 5.2 % — AB (ref 0.0–5.0)
Eosinophils Absolute: 0.2 10*3/uL (ref 0.0–0.7)
Hemoglobin: 8.6 g/dL — ABNORMAL LOW (ref 13.0–17.0)
LYMPHS ABS: 0.5 10*3/uL — AB (ref 0.7–4.0)
Lymphocytes Relative: 13 % (ref 12.0–46.0)
MCHC: 31 g/dL (ref 30.0–36.0)
MCV: 84.6 fl (ref 78.0–100.0)
MONO ABS: 0.6 10*3/uL (ref 0.1–1.0)
Monocytes Relative: 15.3 % — ABNORMAL HIGH (ref 3.0–12.0)
NEUTROS ABS: 2.6 10*3/uL (ref 1.4–7.7)
Neutrophils Relative %: 66 % (ref 43.0–77.0)
Platelets: 137 10*3/uL — ABNORMAL LOW (ref 150.0–400.0)
RBC: 3.26 Mil/uL — ABNORMAL LOW (ref 4.22–5.81)
RDW: 17.4 % — ABNORMAL HIGH (ref 11.5–14.6)
WBC: 4 10*3/uL — AB (ref 4.5–10.5)

## 2013-05-30 LAB — BASIC METABOLIC PANEL
BUN: 16 mg/dL (ref 6–23)
CHLORIDE: 103 meq/L (ref 96–112)
CO2: 32 mEq/L (ref 19–32)
Calcium: 8.9 mg/dL (ref 8.4–10.5)
Creatinine, Ser: 0.9 mg/dL (ref 0.4–1.5)
GFR: 83.28 mL/min (ref >60.00)
Glucose, Bld: 115 mg/dL — ABNORMAL HIGH (ref 70–99)
POTASSIUM: 4.2 meq/L (ref 3.5–5.1)
Sodium: 139 mEq/L (ref 135–145)

## 2013-05-30 NOTE — Progress Notes (Signed)
Echo performed. 

## 2013-05-31 ENCOUNTER — Telehealth: Payer: Self-pay | Admitting: *Deleted

## 2013-05-31 DIAGNOSIS — R609 Edema, unspecified: Secondary | ICD-10-CM

## 2013-05-31 DIAGNOSIS — D649 Anemia, unspecified: Secondary | ICD-10-CM

## 2013-05-31 DIAGNOSIS — Z79899 Other long term (current) drug therapy: Secondary | ICD-10-CM

## 2013-05-31 DIAGNOSIS — D509 Iron deficiency anemia, unspecified: Secondary | ICD-10-CM

## 2013-05-31 NOTE — Telephone Encounter (Signed)
Advised wife of labs and echo

## 2013-05-31 NOTE — Telephone Encounter (Signed)
Message copied by Earvin Hansen on Thu May 31, 2013  5:54 PM ------      Message from: Darlin Coco      Created: Wed May 30, 2013  9:16 PM       hemogglobin down to 8.6.  Increase iron to TID. Continue protonix. Recheck CBC and get anemia panel in about 1 month. ------

## 2013-05-31 NOTE — Telephone Encounter (Signed)
Message copied by Earvin Hansen on Thu May 31, 2013  5:54 PM ------      Message from: Darlin Coco      Created: Wed May 30, 2013  9:09 PM       Please report. Echo is satisfactory. Good LV function. ------

## 2013-06-01 NOTE — Telephone Encounter (Signed)
Did not order B12 and Folate since ordered by Dr Krista Blue

## 2013-06-05 ENCOUNTER — Other Ambulatory Visit: Payer: Self-pay | Admitting: Neurology

## 2013-06-05 ENCOUNTER — Telehealth: Payer: Self-pay | Admitting: Neurology

## 2013-06-05 DIAGNOSIS — R269 Unspecified abnormalities of gait and mobility: Secondary | ICD-10-CM | POA: Diagnosis not present

## 2013-06-05 DIAGNOSIS — E1300 Other specified diabetes mellitus with hyperosmolarity without nonketotic hyperglycemic-hyperosmolar coma (NKHHC): Secondary | ICD-10-CM | POA: Diagnosis not present

## 2013-06-05 NOTE — Telephone Encounter (Signed)
Patient's daughter [name] is f/u on a referral for physical therapy

## 2013-06-05 NOTE — Telephone Encounter (Signed)
Called patient and spoke with patient's daughter [name] about the physical therapy referral. I explained to the daughter that the referral had already been put in and someone would be contacting them about it. I advised the patient's daughter that if the patient has any other problems, questions or concerns to call the office. Patient's daughter verbalized understanding. Sending Message to [facility], referral coordinator.

## 2013-06-08 ENCOUNTER — Telehealth: Payer: Self-pay | Admitting: Neurology

## 2013-06-08 NOTE — Telephone Encounter (Signed)
Called pt and spoke with pt's wife [name] to make an appt with Dr. Krista Blue on 07/12/13. I advised the pt's wife that if the pt has any problems, questions or concerns to call the office. Pt's wife verbalized understanding.

## 2013-06-08 NOTE — Telephone Encounter (Signed)
Called pt to cancel apt for 07/06/13 spoke with wife [name] and she states pt needs to get in before July or Aug. [name] would like for someone to call her back to schedule sooner for pt that pt needs to see Dr. Krista Blue. I did speak with pt and he advised [place] to speak with his wife concerning all the calls. Thanks

## 2013-06-09 DIAGNOSIS — F0390 Unspecified dementia without behavioral disturbance: Secondary | ICD-10-CM | POA: Diagnosis not present

## 2013-06-09 DIAGNOSIS — IMO0001 Reserved for inherently not codable concepts without codable children: Secondary | ICD-10-CM | POA: Diagnosis not present

## 2013-06-13 DIAGNOSIS — F0390 Unspecified dementia without behavioral disturbance: Secondary | ICD-10-CM | POA: Diagnosis not present

## 2013-06-13 DIAGNOSIS — IMO0001 Reserved for inherently not codable concepts without codable children: Secondary | ICD-10-CM | POA: Diagnosis not present

## 2013-06-18 ENCOUNTER — Ambulatory Visit
Admission: RE | Admit: 2013-06-18 | Discharge: 2013-06-18 | Disposition: A | Discharge status: Home / Self Care | Payer: Medicare Other | Source: Ambulatory Visit | Attending: Neurology | Admitting: Neurology

## 2013-06-18 DIAGNOSIS — R269 Unspecified abnormalities of gait and mobility: Secondary | ICD-10-CM

## 2013-06-18 DIAGNOSIS — F0390 Unspecified dementia without behavioral disturbance: Secondary | ICD-10-CM | POA: Diagnosis not present

## 2013-06-18 DIAGNOSIS — M47817 Spondylosis without myelopathy or radiculopathy, lumbosacral region: Secondary | ICD-10-CM | POA: Diagnosis not present

## 2013-06-18 DIAGNOSIS — M5126 Other intervertebral disc displacement, lumbar region: Secondary | ICD-10-CM | POA: Diagnosis not present

## 2013-06-18 DIAGNOSIS — M48061 Spinal stenosis, lumbar region without neurogenic claudication: Secondary | ICD-10-CM | POA: Diagnosis not present

## 2013-06-19 DIAGNOSIS — F0390 Unspecified dementia without behavioral disturbance: Secondary | ICD-10-CM | POA: Diagnosis not present

## 2013-06-19 DIAGNOSIS — IMO0001 Reserved for inherently not codable concepts without codable children: Secondary | ICD-10-CM | POA: Diagnosis not present

## 2013-06-21 DIAGNOSIS — IMO0001 Reserved for inherently not codable concepts without codable children: Secondary | ICD-10-CM | POA: Diagnosis not present

## 2013-06-21 DIAGNOSIS — F0390 Unspecified dementia without behavioral disturbance: Secondary | ICD-10-CM | POA: Diagnosis not present

## 2013-06-26 DIAGNOSIS — IMO0001 Reserved for inherently not codable concepts without codable children: Secondary | ICD-10-CM | POA: Diagnosis not present

## 2013-06-26 DIAGNOSIS — F0390 Unspecified dementia without behavioral disturbance: Secondary | ICD-10-CM | POA: Diagnosis not present

## 2013-06-28 ENCOUNTER — Other Ambulatory Visit: Payer: Self-pay

## 2013-06-28 DIAGNOSIS — R609 Edema, unspecified: Secondary | ICD-10-CM

## 2013-06-28 MED ORDER — NITROGLYCERIN 0.4 MG SL SUBL: 0.4000 mg | SUBLINGUAL_TABLET | SUBLINGUAL | Status: DC | PRN

## 2013-06-28 MED ORDER — FUROSEMIDE 40 MG PO TABS: 40.0000 mg | ORAL_TABLET | Freq: Every morning | ORAL | Status: DC

## 2013-06-29 DIAGNOSIS — F0390 Unspecified dementia without behavioral disturbance: Secondary | ICD-10-CM | POA: Diagnosis not present

## 2013-06-29 DIAGNOSIS — IMO0001 Reserved for inherently not codable concepts without codable children: Secondary | ICD-10-CM | POA: Diagnosis not present

## 2013-07-03 ENCOUNTER — Other Ambulatory Visit: Payer: Self-pay | Admitting: *Deleted

## 2013-07-03 ENCOUNTER — Other Ambulatory Visit (INDEPENDENT_AMBULATORY_CARE_PROVIDER_SITE_OTHER): Payer: Medicare Other

## 2013-07-03 DIAGNOSIS — G629 Polyneuropathy, unspecified: Secondary | ICD-10-CM

## 2013-07-03 DIAGNOSIS — R609 Edema, unspecified: Secondary | ICD-10-CM | POA: Diagnosis not present

## 2013-07-03 DIAGNOSIS — D509 Iron deficiency anemia, unspecified: Secondary | ICD-10-CM

## 2013-07-03 DIAGNOSIS — Z79899 Other long term (current) drug therapy: Secondary | ICD-10-CM | POA: Diagnosis not present

## 2013-07-03 DIAGNOSIS — D649 Anemia, unspecified: Secondary | ICD-10-CM | POA: Diagnosis not present

## 2013-07-03 DIAGNOSIS — E119 Type 2 diabetes mellitus without complications: Secondary | ICD-10-CM

## 2013-07-03 LAB — CBC WITH DIFFERENTIAL/PLATELET
BASOS ABS: 0 10*3/uL (ref 0.0–0.1)
BASOS PCT: 0.7 % (ref 0.0–3.0)
EOS PCT: 4.7 % (ref 0.0–5.0)
Eosinophils Absolute: 0.2 10*3/uL (ref 0.0–0.7)
HEMATOCRIT: 32.1 % — AB (ref 39.0–52.0)
Hemoglobin: 10.1 g/dL — ABNORMAL LOW (ref 13.0–17.0)
Lymphocytes Relative: 18.9 % (ref 12.0–46.0)
Lymphs Abs: 0.8 10*3/uL (ref 0.7–4.0)
MCHC: 31.5 g/dL (ref 30.0–36.0)
MCV: 82.3 fl (ref 78.0–100.0)
MONOS PCT: 13.5 % — AB (ref 3.0–12.0)
Monocytes Absolute: 0.6 10*3/uL (ref 0.1–1.0)
NEUTROS ABS: 2.6 10*3/uL (ref 1.4–7.7)
Neutrophils Relative %: 62.2 % (ref 43.0–77.0)
Platelets: 140 10*3/uL — ABNORMAL LOW (ref 150.0–400.0)
RBC: 3.9 Mil/uL — AB (ref 4.22–5.81)
RDW: 17.7 % — AB (ref 11.5–14.6)
WBC: 4.1 10*3/uL — ABNORMAL LOW (ref 4.5–10.5)

## 2013-07-03 LAB — IBC PANEL
Iron: 61 ug/dL (ref 42–165)
SATURATION RATIOS: 13.9 % — AB (ref 20.0–50.0)
TRANSFERRIN: 313.1 mg/dL (ref 212.0–360.0)

## 2013-07-03 LAB — BASIC METABOLIC PANEL
BUN: 15 mg/dL (ref 6–23)
CO2: 29 mEq/L (ref 19–32)
Calcium: 9.4 mg/dL (ref 8.4–10.5)
Chloride: 100 mEq/L (ref 96–112)
Creatinine, Ser: 0.9 mg/dL (ref 0.4–1.5)
GFR: 81.22 mL/min (ref >60.00)
Glucose, Bld: 96 mg/dL (ref 70–99)
POTASSIUM: 4.6 meq/L (ref 3.5–5.1)
SODIUM: 138 meq/L (ref 135–145)

## 2013-07-03 LAB — IRON: IRON: 61 ug/dL (ref 42–165)

## 2013-07-03 MED ORDER — GABAPENTIN 300 MG PO CAPS: 300.0000 mg | ORAL_CAPSULE | Freq: Every day | ORAL | Status: DC

## 2013-07-04 LAB — FERRITIN: Ferritin: 15.6 ng/mL — ABNORMAL LOW (ref 22.0–322.0)

## 2013-07-05 ENCOUNTER — Other Ambulatory Visit: Payer: Self-pay

## 2013-07-05 ENCOUNTER — Telehealth: Payer: Self-pay | Admitting: *Deleted

## 2013-07-05 DIAGNOSIS — G629 Polyneuropathy, unspecified: Secondary | ICD-10-CM

## 2013-07-05 MED ORDER — GABAPENTIN 300 MG PO CAPS: 300.0000 mg | ORAL_CAPSULE | Freq: Every day | ORAL | Status: DC

## 2013-07-05 MED ORDER — PANTOPRAZOLE SODIUM 40 MG PO TBEC: 40.0000 mg | DELAYED_RELEASE_TABLET | Freq: Every day | ORAL | Status: DC

## 2013-07-05 NOTE — Telephone Encounter (Signed)
Message copied by Earvin Hansen on Thu Jul 05, 2013  5:02 PM ------      Message from: Darlin Coco      Created: Wed Jul 04, 2013  3:30 PM       Please report. Studies show that his iron stores are still low.  Continue iron. His hgb is improved.  Now 10.1 ------

## 2013-07-05 NOTE — Telephone Encounter (Signed)
Advised patient and wife of lab results

## 2013-07-06 ENCOUNTER — Ambulatory Visit: Payer: Medicare Other | Admitting: Neurology

## 2013-07-12 ENCOUNTER — Telehealth: Payer: Self-pay | Admitting: Neurology

## 2013-07-12 ENCOUNTER — Ambulatory Visit: Payer: Self-pay | Admitting: Neurology

## 2013-07-12 NOTE — Telephone Encounter (Signed)
[name] from [facility] calling to state that she needs an outstanding face to face that needs to be signed. Please return her call.

## 2013-07-12 NOTE — Telephone Encounter (Signed)
Called [place] from [facility], she is faxing face to face evaluation to be completed.please call and [phone] she will pick up when ready

## 2013-07-23 ENCOUNTER — Ambulatory Visit: Payer: Self-pay | Admitting: Neurology

## 2013-07-26 ENCOUNTER — Encounter: Payer: Self-pay | Admitting: Neurology

## 2013-07-26 ENCOUNTER — Ambulatory Visit (INDEPENDENT_AMBULATORY_CARE_PROVIDER_SITE_OTHER): Payer: Medicare Other | Admitting: Neurology

## 2013-07-26 VITALS — Ht 70.0 in | Wt 241.0 lb

## 2013-07-26 DIAGNOSIS — E119 Type 2 diabetes mellitus without complications: Secondary | ICD-10-CM

## 2013-07-26 DIAGNOSIS — D500 Iron deficiency anemia secondary to blood loss (chronic): Secondary | ICD-10-CM

## 2013-07-26 DIAGNOSIS — R269 Unspecified abnormalities of gait and mobility: Secondary | ICD-10-CM | POA: Diagnosis not present

## 2013-07-26 MED ORDER — MEMANTINE HCL ER 28 MG PO CP24: 28.0000 mg | ORAL_CAPSULE | Freq: Every day | ORAL | Status: DC

## 2013-07-26 MED ORDER — SOLIFENACIN SUCCINATE 5 MG PO TABS: 5.0000 mg | ORAL_TABLET | Freq: Every day | ORAL | Status: DC

## 2013-07-26 MED ORDER — GABAPENTIN 100 MG PO CAPS: ORAL_CAPSULE | ORAL | Status: DC

## 2013-07-26 NOTE — Progress Notes (Signed)
PATIENT: [name] DOB: [birth year]  HISTORICAL (inital March 9th 2015)  [name] is a 80 yo RH referred by cardiologist [name], Primary care is Dr. Timoteo Gaul, for evaluation of memory trouble, gait difficulty, he is accompanied by his wife, and daughter at today's clinical visit.  He had a past medical history of chronic atrial fibrillation, was on Coumadin treatment, but developed recurrent GI bleeding, from reviewing the chart, he is not on any anticoagulation treatment, he also had past medical history of obesity, sedentary lifestyle, diabetes,  Family noticed he had gradual onset memory trouble over the past few years, has been taking Aricept 10 mg every day, he also has gradual onset gait difficulty, used to cane since 2012, he has no significant low back pain, or neck pain, recent 12 months, since 2014, he has worsening gait difficulty, urinary incontinence, urgency, worsening memory trouble, he tends to repeat himself, forget familiar people's name, forgot to turn off stove, he has quit driving for a few months now,  He has bilateral lower extremity edema, recent echocardiogram showed ejection fraction 60-65%, moderate mitral regurgitation, mild aortic stenosis   He went to school for 10 years, was a Armed forces operational officer, was able to retired at age 37, traveled all over the world, but has become sedentary over the past 10 years, he still enjoys playing his computer games    UPDATE May 7th 2015:  I have reviewed MRI with patient and his family, MRI of brain showed moderate atrophy, mild to moderate periventricular small vessel disease. Is  MRI of lumbar showed L4-5: pseudo-disc bulging with facet hypertrophy with moderate biforaminal foraminal stenosis, L5-S1: severe facet arthropathy with no spinal stenosis or foraminal narrowing.  At L1-2, L2-3, L3-4: disc bulging and facet hypertrophy with mild biforaminal foraminal stenosis   Laboratory showed a mild anemia, ferritin was 15,  otherwise normal B12, CMP,  Family reported multiple episodes of anemia, in the past, no etiology was found, he had physical therapy for 3 weeks, no significant change, he denies low back pain, does has urinary urgency, occasionally incontinence episodes.  He has been taking gabapentin 300 mg every night which has helped his sleep,  REVIEW OF SYSTEMS: Full 14 system review of systems performed and notable only for gait difficulty, bilateral lower extremity edema   ALLERGIES: Allergies  Allergen Reactions  . Cephalexin Other (See Comments)    Unknown reaction  . Furacin [Nitrofurazone] Other (See Comments)    Unknown reaction  . Macrodantin Other (See Comments)    Unknown reaction  . Zestril [Lisinopril] Other (See Comments)    Unknown reaction    HOME MEDICATIONS: Current Outpatient Prescriptions on File Prior to Visit  Medication Sig Dispense Refill  . acetaminophen (TYLENOL) 325 MG tablet Take 650 mg by mouth every 6 (six) hours as needed for pain or fever.      . COD LIVER OIL PO Take 1 capsule by mouth daily.       [name] donepezil (ARICEPT) 10 MG tablet Take 10 mg by mouth at bedtime.      . ferrous sulfate 325 (65 FE) MG EC tablet Take 1 tablet (325 mg total) by mouth 2 (two) times daily.      . folic acid (FOLVITE) 1 MG tablet Take 1 mg by mouth 2 (two) times daily.       . furosemide (LASIX) 40 MG tablet Take 1 tablet (40 mg total) by mouth every morning.  90 tablet  3  .  glipiZIDE (GLUCOTROL) 5 MG tablet Take 1 tablet (5 mg total) by mouth daily.  90 tablet  3  . memantine (NAMENDA) 10 MG tablet Take 1 tablet (10 mg total) by mouth 2 (two) times daily.  180 tablet  1  . metoprolol tartrate (LOPRESSOR) 25 MG tablet Take 0.5 tablets (12.5 mg total) by mouth every morning.  30 tablet  5  . Multiple Vitamin (MULTIVITAMIN) tablet Take 1 tablet by mouth every morning.       . Multiple Vitamins-Minerals (PRESERVISION AREDS 2) CAPS Take 1 capsule by mouth every morning.      .  nitroGLYCERIN (NITROSTAT) 0.4 MG SL tablet Place 1 tablet (0.4 mg total) under the tongue every 5 (five) minutes as needed for chest pain.  100 tablet  0  . oxybutynin (DITROPAN XL) 15 MG 24 hr tablet Take 1 tablet (15 mg total) by mouth daily.  90 tablet  3  . pantoprazole (PROTONIX) 40 MG tablet Take 1 tablet (40 mg total) by mouth daily.  90 tablet  3  . Polyethylene Glycol 3350 (GLYCOLAX PO) Take 1 packet by mouth daily as needed. For constipation      . gabapentin (NEURONTIN) 300 MG capsule Take 1 capsule (300 mg total) by mouth at bedtime.  90 capsule  3  . [DISCONTINUED] oxybutynin (DITROPAN XL) 15 MG 24 hr tablet Take 1 tablet (15 mg total) by mouth daily.  90 tablet  3     PAST MEDICAL HISTORY: Past Medical History  Diagnosis Date  . Arrhythmia     a fib  . ED (erectile dysfunction)   . Memory disorder   . 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 Hematochezia     has intermittent problem  . Arthritis     DJD knee - left  . Restless leg     treats with heat  . Cancer     bladder cancer - [facility] [name]  . Diabetes mellitus     NIDDM on oral medication  . Genital warts     recurrent. Not a problem  . GERD (gastroesophageal reflux disease)     treated with PPI  . Hypertension     mild  . Neuropathy     burning discomfort feet/legs  . Irritable bladder     PAST SURGICAL HISTORY: Past Surgical History  Procedure Laterality Date  . Cardiovascular stress test  03/28/2003    EF 55%.  Normal cardiolite study  . Appendectomy  1946  . Tonsillectomy  1938  . Joint replacement  1990's    left TKR  . Knee arthroscopy  1980's    right knee    FAMILY HISTORY: Family History  Problem Relation Age of Onset  . Kidney disease Mother   . Heart disease Mother   . Stroke Mother   . Cancer Father     prostate  . Diabetes Neg Hx   . COPD Neg Hx   . Mental illness Brother     SOCIAL HISTORY:  History   Social History  . Marital Status: Married     Spouse Name: [name]    Number of Children: 4  . Years of Education: 10th    Occupational History  . retired    Social History Main Topics  . Smoking status: Former Smoker    Quit date: 08/28/1975  . Smokeless tobacco: Former Systems developer  . Alcohol Use: 4.2 oz/week    7 Shots of liquor per  week  . Drug Use: No  . Sexual Activity: Not Currently   Other Topics Concern  . Not on file   Social History Narrative   Patient lives at home with his wife [name]) Married 59 years.   Retired   Education 10 th grade   Right handed   Caffeine one or two daily      PHYSICAL EXAM   Filed Vitals:   07/26/13 1411  Height: 5' 10"  (1.778 m)  Weight: 241 lb (109.317 kg)    Body mass index is 34.58 kg/(m^2).   Generalized: In no acute distress  Neck: Supple, no carotid bruits   Cardiac: Regular rate rhythm  Pulmonary: Clear to auscultation bilaterally  Musculoskeletal: No deformity  Neurological examination  Mentation: Alert oriented to time, place, history taking, and causual conversation, Mini-Mental Status Examination is 23 out of 30, he is not oriented to time, year, month, missed 1 out of 3 recalls   Cranial nerve II-XII: Pupils were equal round reactive to light. Extraocular movements were full.  Visual field were full on confrontational test. Bilateral fundi were sharp.  Facial sensation and strength were normal. Hearing was intact to finger rubbing bilaterally. Uvula tongue midline.  Head turning and shoulder shrug and were normal and symmetric.Tongue protrusion into cheek strength was normal.  Motor:  bilateral lower extremity pitting edema, mild bilateral ankle dorsiflexion, mild bilateral hip flexor weakness   Sensory: length dependent decreased pinprick to midshin level, absent toe vibratory sensation,    Coordination: Normal finger to nose, heel-to-shin bilaterally there was no truncal ataxia  Gait: Rising up from seated position by pushing on chair arm, cautious, wide  based unsteady, with bilateral foot drop,  Romberg signs: Negative  Deep tendon reflexes: Brachioradialis 2/2, biceps 2/2, triceps 2/2, patellar absent, Achilles absent, plantar responses were flexor bilaterally.   DIAGNOSTIC DATA (LABS, IMAGING, TESTING) - I reviewed patient records, labs, notes, testing and imaging myself where available.  Lab Results  Component Value Date   WBC 4.1* 07/03/2013   HGB 10.1* 07/03/2013   HCT 32.1* 07/03/2013   MCV 82.3 07/03/2013   PLT 140.0* 07/03/2013      Component Value Date/Time   NA 138 07/03/2013 1602   K 4.6 07/03/2013 1602   CL 100 07/03/2013 1602   CO2 29 07/03/2013 1602   GLUCOSE 96 07/03/2013 1602   BUN 15 07/03/2013 1602   CREATININE 0.9 07/03/2013 1602   CALCIUM 9.4 07/03/2013 1602   PROT 6.9 12/20/2012 1622   ALBUMIN 3.8 12/20/2012 1622   AST 28 12/20/2012 1622   ALT 15 12/20/2012 1622   ALKPHOS 109 12/20/2012 1622   BILITOT 0.7 12/20/2012 1622   GFRNONAA >60 07/22/2009 1920   GFRAA  Value: >60        The eGFR has been calculated using the MDRD equation. This calculation has not been validated in all clinical situations. eGFR's persistently <60 mL/min signify possible Chronic Kidney Disease. 07/22/2009 1920   Lab Results  Component Value Date   CHOL 129 12/20/2012   HDL 46.80 12/20/2012   LDLCALC 75 12/20/2012   TRIG 38.0 12/20/2012   CHOLHDL 3 12/20/2012   Lab Results  Component Value Date   HGBA1C 5.3 12/14/2011   Lab Results  Component Value Date   UUEKCMKL49 179 02/27/2009    ASSESSMENT AND PLAN  [name] is a 80 y.o. male    presenting with gradual onset memory trouble, gait difficulty, with distal leg weakness, hyporeflexia,  1. his  gait difficulty are due to combination of lumbar stenosis, peripheral neuropathy , deconditioning, 2. memory loss, most likely central nervous system degenerative disorder, mild cognitive dysfunction, increase Namenda to xr 28 mg every day, continue Aricept 10 mg every day, 3. moderate exercise,   4. VESIcare,  Change gabapentin to 100 mg 1-3 tabs every night    return to clinic as needed   Marcial Pacas, M.D. Ph.D.  [facility] [address], [place], [place] [address] [phone]

## 2013-07-27 NOTE — Telephone Encounter (Signed)
Please return Cathy's call concerning the Face to Face evaluation she that she is needing to pick up. Thanks

## 2013-07-30 NOTE — Telephone Encounter (Signed)
I called and relayed the face to face for the 06-08-13 referral, can this be used?   She stated yes.  I faxed to [phone] [facility] with Alvis Lemmings.

## 2013-09-05 ENCOUNTER — Ambulatory Visit (INDEPENDENT_AMBULATORY_CARE_PROVIDER_SITE_OTHER): Payer: Medicare Other | Admitting: Cardiology

## 2013-09-05 ENCOUNTER — Encounter: Payer: Self-pay | Admitting: Cardiology

## 2013-09-05 VITALS — BP 134/70 | HR 78 | Ht 70.0 in | Wt 246.0 lb

## 2013-09-05 DIAGNOSIS — D509 Iron deficiency anemia, unspecified: Secondary | ICD-10-CM

## 2013-09-05 DIAGNOSIS — F0390 Unspecified dementia without behavioral disturbance: Secondary | ICD-10-CM

## 2013-09-05 DIAGNOSIS — I059 Rheumatic mitral valve disease, unspecified: Secondary | ICD-10-CM

## 2013-09-05 DIAGNOSIS — I119 Hypertensive heart disease without heart failure: Secondary | ICD-10-CM

## 2013-09-05 DIAGNOSIS — E119 Type 2 diabetes mellitus without complications: Secondary | ICD-10-CM

## 2013-09-05 DIAGNOSIS — I340 Nonrheumatic mitral (valve) insufficiency: Secondary | ICD-10-CM

## 2013-09-05 DIAGNOSIS — I4891 Unspecified atrial fibrillation: Secondary | ICD-10-CM | POA: Diagnosis not present

## 2013-09-05 NOTE — Patient Instructions (Signed)
Your physician recommends that you continue on your current medications as directed. Please refer to the Current Medication list given to you today.   Your physician wants you to follow-up in: 4 month.  You will receive a reminder letter in the mail two months in advance. If you don't receive a letter, please call our office to schedule the follow-up appointment @ [phone].

## 2013-09-05 NOTE — Progress Notes (Signed)
[name] Date of Birth:  1928/03/21 [facility] [address] [place], [place]  [address] [phone]        Fax   [phone]   History of Present Illness: This pleasant 80 year old gentleman is seen for a scheduled followup office visit. He has a past history of chronic established atrial fibrillation. He is not on Coumadin because of recurrent GI bleeds. He also has problems with exogenous obesity and diabetes. . He had a cardiac catheterization on 08/07/01 showing normal coronary arteries and an ejection fraction of 60-65%. He had an echocardiogram on 04/21/11 showing an ejection fraction of 60-65%, moderate mitral regurgitation, mild aortic insufficiency, moderate tricuspid regurgitation, and pulmonary artery pressure was 46 mmHg. since we last saw him he has had  problems with peripheral edema and weight gain. His weight is down 12 pounds since last visit. Is not having any paroxysmal nocturnal dyspnea or nocturia. He states that his peripheral edema goes down significantly overnight.  He has not been taking his Lasix every day.   Current Outpatient Prescriptions  Medication Sig Dispense Refill  . acetaminophen (TYLENOL) 325 MG tablet Take 650 mg by mouth every 6 (six) hours as needed for pain or fever.      . COD LIVER OIL PO Take 1 capsule by mouth daily.       [name] donepezil (ARICEPT) 10 MG tablet Take 10 mg by mouth at bedtime.      . ferrous sulfate 325 (65 FE) MG EC tablet Take 325 mg by mouth 3 (three) times daily with meals.      . folic acid (FOLVITE) 1 MG tablet Take 1 mg by mouth 2 (two) times daily.       . furosemide (LASIX) 40 MG tablet Take 1 tablet (40 mg total) by mouth every morning.  90 tablet  3  . gabapentin (NEURONTIN) 100 MG capsule 1-3  Tabs qhs  90 capsule  12  . glipiZIDE (GLUCOTROL) 5 MG tablet Take 1 tablet (5 mg total) by mouth daily.  90 tablet  3  . Memantine HCl ER (NAMENDA XR) 28 MG CP24 Take 28 mg by mouth daily.  30 capsule  12   . metoprolol tartrate (LOPRESSOR) 25 MG tablet Take 0.5 tablets (12.5 mg total) by mouth every morning.  30 tablet  5  . Multiple Vitamin (MULTIVITAMIN) tablet Take 1 tablet by mouth every morning.       . Multiple Vitamins-Minerals (PRESERVISION AREDS 2) CAPS Take 1 capsule by mouth every morning.      . nitroGLYCERIN (NITROSTAT) 0.4 MG SL tablet Place 1 tablet (0.4 mg total) under the tongue every 5 (five) minutes as needed for chest pain.  100 tablet  0  . pantoprazole (PROTONIX) 40 MG tablet Take 1 tablet (40 mg total) by mouth daily.  90 tablet  1  . Polyethylene Glycol 3350 (GLYCOLAX PO) Take 1 packet by mouth daily as needed. For constipation      . solifenacin (VESICARE) 5 MG tablet Take 1 tablet (5 mg total) by mouth daily.  30 tablet  6  . [DISCONTINUED] oxybutynin (DITROPAN XL) 15 MG 24 hr tablet Take 1 tablet (15 mg total) by mouth daily.  90 tablet  3   No current facility-administered medications for this visit.    Allergies  Allergen Reactions  . Cephalexin Other (See Comments)    Unknown reaction  . Furacin [Nitrofurazone] Other (See Comments)    Unknown reaction  .  Macrodantin Other (See Comments)    Unknown reaction  . Zestril [Lisinopril] Other (See Comments)    Unknown reaction    Patient Active Problem List   Diagnosis Date Noted  . Atrial fibrillation 09/01/2010    Priority: High  . Diabetes mellitus 09/01/2010    Priority: High  . Dementia 09/01/2010    Priority: High  . Abnormality of gait 05/28/2013  . Chronic venous insufficiency 04/04/2013  . Weight loss, non-intentional 12/20/2012  . Low back pain 11/17/2012  . Mitral regurgitation 08/26/2011  . Heart murmur 04/15/2011  . Iron deficiency anemia due to chronic blood loss 09/01/2010  . Benign hypertensive heart disease without heart failure 09/01/2010    History  Smoking status  . Former Smoker  . Quit date: 08/28/1975  Smokeless tobacco  . Former User    History  Alcohol Use  . 4.2  oz/week  . 7 Shots of liquor per week    Family History  Problem Relation Age of Onset  . Kidney disease Mother   . Heart disease Mother   . Stroke Mother   . Cancer Father     prostate  . Diabetes Neg Hx   . COPD Neg Hx   . Mental illness Brother     Review of Systems: Constitutional: no fever chills diaphoresis or fatigue or change in weight.  Head and neck: no hearing loss, no epistaxis, no photophobia or visual disturbance. Respiratory: No cough, shortness of breath or wheezing. Cardiovascular: No chest pain peripheral edema, palpitations. Gastrointestinal: No abdominal distention, no abdominal pain, no change in bowel habits hematochezia or melena. Genitourinary: No dysuria, no frequency, no urgency, no nocturia. Musculoskeletal:No arthralgias, no back pain, no gait disturbance or myalgias. Neurological: No dizziness, no headaches, no numbness, no seizures, no syncope, no weakness, no tremors. Hematologic: No lymphadenopathy, no easy bruising. Psychiatric: No confusion, no hallucinations, no sleep disturbance.    Physical Exam: Filed Vitals:   09/05/13 1510  BP: 134/70  Pulse: 78   the general appearance reveals a pleasant large gentleman in no distress.The head and neck exam reveals pupils equal and reactive.  Extraocular movements are full.  There is no scleral icterus.  The mouth and pharynx are normal.  The neck is supple.  The carotids reveal no bruits.  The jugular venous pressure is normal.  The  thyroid is not enlarged.  There is no lymphadenopathy.  The chest is clear to percussion and auscultation.  There are no rales or rhonchi.  Expansion of the chest is symmetrical.  The precordium is quiet.  The pulse is irregular The first heart sound is normal.  The second heart sound is physiologically split.  There is soft apical systolic murmur  There is no abnormal lift or heave.  The abdomen is soft and nontender.  The bowel sounds are normal.  The liver and spleen are not  enlarged.  There are no abdominal masses.  There are no abdominal bruits.  Extremities reveal good pedal pulses.  There is mild peripheral edema  There is no cyanosis or clubbing.  Strength is normal and symmetrical in all extremities.  There is no lateralizing weakness.  There are no sensory deficits.  The skin is warm and dry.  There is no rash.     Assessment / Plan: 1. persistent permanent atrial fibrillation 2. diabetes mellitus 3. chronic diastolic heart failure 4. severe dementia 5. osteoarthritis  Continue same medications.  Recheck in 4 months for office visit and lab work

## 2013-09-05 NOTE — Assessment & Plan Note (Signed)
Unfortunately has dementia has progressed significantly.  He has been seen by neurology since last visit

## 2013-09-05 NOTE — Assessment & Plan Note (Signed)
He has not been having any hypoglycemic episodes.  He is not careful with his diet.

## 2013-09-05 NOTE — Assessment & Plan Note (Signed)
Blood pressure has been remaining stable on current therapy. 

## 2013-09-05 NOTE — Assessment & Plan Note (Signed)
He has not been having any increased shortness of breath.  No angina pectoris.

## 2013-09-11 ENCOUNTER — Telehealth: Payer: Self-pay | Admitting: Neurology

## 2013-09-11 MED ORDER — MEMANTINE HCL ER 28 MG PO CP24: 28.0000 mg | ORAL_CAPSULE | Freq: Every day | ORAL | Status: DC

## 2013-09-11 NOTE — Telephone Encounter (Signed)
It appears Rx was previously sent on 05/07 for 30 day supply.  I have resent the Rx for 90 days as requested.

## 2013-09-11 NOTE — Telephone Encounter (Signed)
Payal from [employer] calling with a script request for patient for Namenda XR capsules 90 day supply. Reference number is [id number].

## 2013-09-20 LAB — THYROID PANEL WITH TSH
Free Thyroxine Index: 1.3 (ref 1.2–4.9)
T3 UPTAKE RATIO: 26 % (ref 24–39)
T4, Total: 5 ug/dL (ref 4.5–12.0)
TSH: 3.71 u[IU]/mL (ref 0.450–4.500)

## 2013-09-20 LAB — RPR: RPR: NONREACTIVE

## 2013-09-20 LAB — FOLATE: Folate: >19.9 ng/mL (ref >3.0)

## 2013-09-20 LAB — ANA W/REFLEX IF POSITIVE: Anti Nuclear Antibody(ANA): NEGATIVE

## 2013-09-20 LAB — C-REACTIVE PROTEIN: CRP: 1.2 mg/L (ref 0.0–4.9)

## 2013-09-20 LAB — VITAMIN B12: Vitamin B-12: 962 pg/mL — ABNORMAL HIGH (ref 211–946)

## 2013-09-20 LAB — CK: Total CK: 98 U/L (ref 24–204)

## 2013-10-08 ENCOUNTER — Telehealth: Payer: Self-pay | Admitting: Neurology

## 2013-10-08 ENCOUNTER — Telehealth: Payer: Self-pay | Admitting: Internal Medicine

## 2013-10-08 NOTE — Telephone Encounter (Signed)
Called [name] back she just wanted to make sure there med list matches up with md  Just making sure he is taking all meds correctly...[name]

## 2013-10-08 NOTE — Telephone Encounter (Signed)
[name] call request to speak to the assistant concern about [name] medications. Please [phone].

## 2013-10-08 NOTE — Telephone Encounter (Signed)
Loma Sousa,  CNA, requesting medications details for patient. Please call [phone]

## 2013-10-09 NOTE — Telephone Encounter (Signed)
Vivi Ferns, CNA for the pt and explained to her that I could not give her any information because of the [place] law and if the pt or other family member that hired her, could give [place] a call back. [name] verbalized understanding.

## 2013-10-12 ENCOUNTER — Other Ambulatory Visit: Payer: Self-pay

## 2013-10-12 MED ORDER — SOLIFENACIN SUCCINATE 5 MG PO TABS: 5.0000 mg | ORAL_TABLET | Freq: Every day | ORAL | Status: DC

## 2013-10-12 NOTE — Telephone Encounter (Signed)
Mail order requested new Rx for 90 day supply

## 2013-10-24 ENCOUNTER — Encounter: Payer: Self-pay | Admitting: Cardiology

## 2013-10-25 ENCOUNTER — Other Ambulatory Visit: Payer: Self-pay | Admitting: *Deleted

## 2013-10-25 MED ORDER — DONEPEZIL HCL 10 MG PO TABS: 10.0000 mg | ORAL_TABLET | Freq: Every day | ORAL | Status: DC

## 2013-10-26 ENCOUNTER — Telehealth: Payer: Self-pay | Admitting: *Deleted

## 2013-10-26 NOTE — Telephone Encounter (Signed)
Losartan 50 mg daily and Vitamin D 1,000 units daily added to chart, patient has been taking both daily. Losartan removed by another office in error

## 2013-12-01 DIAGNOSIS — I517 Cardiomegaly: Secondary | ICD-10-CM | POA: Diagnosis not present

## 2013-12-01 DIAGNOSIS — Z79899 Other long term (current) drug therapy: Secondary | ICD-10-CM | POA: Diagnosis not present

## 2013-12-01 DIAGNOSIS — E119 Type 2 diabetes mellitus without complications: Secondary | ICD-10-CM | POA: Diagnosis not present

## 2013-12-01 DIAGNOSIS — R5381 Other malaise: Secondary | ICD-10-CM | POA: Diagnosis not present

## 2013-12-01 DIAGNOSIS — R5383 Other fatigue: Secondary | ICD-10-CM | POA: Diagnosis not present

## 2013-12-01 DIAGNOSIS — R059 Cough, unspecified: Secondary | ICD-10-CM | POA: Diagnosis not present

## 2013-12-01 DIAGNOSIS — I10 Essential (primary) hypertension: Secondary | ICD-10-CM | POA: Diagnosis not present

## 2013-12-01 DIAGNOSIS — G250 Essential tremor: Secondary | ICD-10-CM | POA: Diagnosis not present

## 2013-12-01 DIAGNOSIS — R05 Cough: Secondary | ICD-10-CM | POA: Diagnosis not present

## 2013-12-06 ENCOUNTER — Other Ambulatory Visit: Payer: Self-pay | Admitting: *Deleted

## 2013-12-06 DIAGNOSIS — E119 Type 2 diabetes mellitus without complications: Secondary | ICD-10-CM

## 2013-12-06 MED ORDER — PANTOPRAZOLE SODIUM 40 MG PO TBEC: 40.0000 mg | DELAYED_RELEASE_TABLET | Freq: Every day | ORAL | Status: DC

## 2013-12-06 MED ORDER — GLIPIZIDE 5 MG PO TABS: 5.0000 mg | ORAL_TABLET | Freq: Every day | ORAL | Status: DC

## 2013-12-06 NOTE — Telephone Encounter (Signed)
Ok per  [name]

## 2013-12-07 ENCOUNTER — Telehealth: Payer: Self-pay | Admitting: Internal Medicine

## 2013-12-07 NOTE — Telephone Encounter (Signed)
[name] from [employer] need to clarify orders on a scrip and her number is [phone]    Thank you!

## 2013-12-11 DIAGNOSIS — B351 Tinea unguium: Secondary | ICD-10-CM | POA: Diagnosis not present

## 2013-12-11 DIAGNOSIS — M2040 Other hammer toe(s) (acquired), unspecified foot: Secondary | ICD-10-CM | POA: Diagnosis not present

## 2013-12-11 DIAGNOSIS — E1149 Type 2 diabetes mellitus with other diabetic neurological complication: Secondary | ICD-10-CM | POA: Diagnosis not present

## 2013-12-17 ENCOUNTER — Encounter (HOSPITAL_COMMUNITY): Payer: Self-pay | Admitting: Emergency Medicine

## 2013-12-17 ENCOUNTER — Emergency Department (HOSPITAL_COMMUNITY): Payer: Medicare Other

## 2013-12-17 ENCOUNTER — Observation Stay (HOSPITAL_COMMUNITY)
Admission: EM | Admit: 2013-12-17 | Discharge: 2013-12-18 | Disposition: A | Discharge status: Home / Self Care | Payer: Medicare Other | Attending: Internal Medicine | Admitting: Internal Medicine

## 2013-12-17 DIAGNOSIS — E118 Type 2 diabetes mellitus with unspecified complications: Secondary | ICD-10-CM | POA: Diagnosis not present

## 2013-12-17 DIAGNOSIS — Z8619 Personal history of other infectious and parasitic diseases: Secondary | ICD-10-CM | POA: Insufficient documentation

## 2013-12-17 DIAGNOSIS — N3289 Other specified disorders of bladder: Secondary | ICD-10-CM | POA: Diagnosis not present

## 2013-12-17 DIAGNOSIS — IMO0002 Reserved for concepts with insufficient information to code with codable children: Secondary | ICD-10-CM | POA: Diagnosis not present

## 2013-12-17 DIAGNOSIS — M1710 Unilateral primary osteoarthritis, unspecified knee: Secondary | ICD-10-CM | POA: Diagnosis not present

## 2013-12-17 DIAGNOSIS — R072 Precordial pain: Secondary | ICD-10-CM | POA: Diagnosis not present

## 2013-12-17 DIAGNOSIS — I10 Essential (primary) hypertension: Secondary | ICD-10-CM | POA: Diagnosis not present

## 2013-12-17 DIAGNOSIS — Z87891 Personal history of nicotine dependence: Secondary | ICD-10-CM | POA: Diagnosis not present

## 2013-12-17 DIAGNOSIS — N529 Male erectile dysfunction, unspecified: Secondary | ICD-10-CM | POA: Diagnosis not present

## 2013-12-17 DIAGNOSIS — Z79899 Other long term (current) drug therapy: Secondary | ICD-10-CM | POA: Diagnosis not present

## 2013-12-17 DIAGNOSIS — R609 Edema, unspecified: Secondary | ICD-10-CM | POA: Diagnosis not present

## 2013-12-17 DIAGNOSIS — Z8551 Personal history of malignant neoplasm of bladder: Secondary | ICD-10-CM | POA: Diagnosis not present

## 2013-12-17 DIAGNOSIS — I4891 Unspecified atrial fibrillation: Secondary | ICD-10-CM | POA: Insufficient documentation

## 2013-12-17 DIAGNOSIS — I059 Rheumatic mitral valve disease, unspecified: Secondary | ICD-10-CM | POA: Insufficient documentation

## 2013-12-17 DIAGNOSIS — F0390 Unspecified dementia without behavioral disturbance: Secondary | ICD-10-CM | POA: Diagnosis not present

## 2013-12-17 DIAGNOSIS — I4819 Other persistent atrial fibrillation: Secondary | ICD-10-CM

## 2013-12-17 DIAGNOSIS — G2581 Restless legs syndrome: Secondary | ICD-10-CM | POA: Diagnosis not present

## 2013-12-17 DIAGNOSIS — J411 Mucopurulent chronic bronchitis: Secondary | ICD-10-CM | POA: Diagnosis not present

## 2013-12-17 DIAGNOSIS — I872 Venous insufficiency (chronic) (peripheral): Secondary | ICD-10-CM | POA: Diagnosis present

## 2013-12-17 DIAGNOSIS — K219 Gastro-esophageal reflux disease without esophagitis: Secondary | ICD-10-CM | POA: Diagnosis not present

## 2013-12-17 DIAGNOSIS — I5033 Acute on chronic diastolic (congestive) heart failure: Secondary | ICD-10-CM | POA: Diagnosis not present

## 2013-12-17 DIAGNOSIS — R079 Chest pain, unspecified: Secondary | ICD-10-CM | POA: Diagnosis not present

## 2013-12-17 DIAGNOSIS — I517 Cardiomegaly: Secondary | ICD-10-CM | POA: Diagnosis not present

## 2013-12-17 DIAGNOSIS — E119 Type 2 diabetes mellitus without complications: Secondary | ICD-10-CM | POA: Diagnosis not present

## 2013-12-17 DIAGNOSIS — G589 Mononeuropathy, unspecified: Secondary | ICD-10-CM | POA: Insufficient documentation

## 2013-12-17 DIAGNOSIS — I482 Chronic atrial fibrillation, unspecified: Secondary | ICD-10-CM

## 2013-12-17 DIAGNOSIS — D509 Iron deficiency anemia, unspecified: Secondary | ICD-10-CM | POA: Diagnosis not present

## 2013-12-17 DIAGNOSIS — I340 Nonrheumatic mitral (valve) insufficiency: Secondary | ICD-10-CM

## 2013-12-17 DIAGNOSIS — I480 Paroxysmal atrial fibrillation: Secondary | ICD-10-CM

## 2013-12-17 DIAGNOSIS — I5032 Chronic diastolic (congestive) heart failure: Secondary | ICD-10-CM | POA: Diagnosis present

## 2013-12-17 DIAGNOSIS — I119 Hypertensive heart disease without heart failure: Secondary | ICD-10-CM | POA: Diagnosis not present

## 2013-12-17 DIAGNOSIS — R269 Unspecified abnormalities of gait and mobility: Secondary | ICD-10-CM

## 2013-12-17 DIAGNOSIS — D500 Iron deficiency anemia secondary to blood loss (chronic): Secondary | ICD-10-CM | POA: Diagnosis present

## 2013-12-17 LAB — I-STAT TROPONIN, ED: TROPONIN I, POC: 0 ng/mL (ref 0.00–0.08)

## 2013-12-17 LAB — BASIC METABOLIC PANEL
Anion gap: 10 (ref 5–15)
BUN: 11 mg/dL (ref 6–23)
CO2: 27 mEq/L (ref 19–32)
Calcium: 8.9 mg/dL (ref 8.4–10.5)
Chloride: 96 mEq/L (ref 96–112)
Creatinine, Ser: 0.77 mg/dL (ref 0.50–1.35)
GFR, EST NON AFRICAN AMERICAN: 81 mL/min — AB (ref >90)
GLUCOSE: 120 mg/dL — AB (ref 70–99)
POTASSIUM: 4.5 meq/L (ref 3.7–5.3)
SODIUM: 133 meq/L — AB (ref 137–147)

## 2013-12-17 LAB — CBC
HCT: 33.3 % — ABNORMAL LOW (ref 39.0–52.0)
HEMOGLOBIN: 11 g/dL — AB (ref 13.0–17.0)
MCH: 28.1 pg (ref 26.0–34.0)
MCHC: 33 g/dL (ref 30.0–36.0)
MCV: 84.9 fL (ref 78.0–100.0)
Platelets: 182 10*3/uL (ref 150–400)
RBC: 3.92 MIL/uL — AB (ref 4.22–5.81)
RDW: 15.3 % (ref 11.5–15.5)
WBC: 5.2 10*3/uL (ref 4.0–10.5)

## 2013-12-17 LAB — D-DIMER, QUANTITATIVE (NOT AT ARMC)

## 2013-12-17 LAB — GLUCOSE, CAPILLARY: Glucose-Capillary: 100 mg/dL — ABNORMAL HIGH (ref 70–99)

## 2013-12-17 LAB — PRO B NATRIURETIC PEPTIDE: Pro B Natriuretic peptide (BNP): 573.8 pg/mL — ABNORMAL HIGH (ref 0–450)

## 2013-12-17 LAB — TROPONIN I

## 2013-12-17 MED ORDER — LOSARTAN POTASSIUM 50 MG PO TABS
50.0000 mg | ORAL_TABLET | Freq: Every day | ORAL | Status: DC
  Administered 2013-12-18: 50 mg via ORAL
  Filled 2013-12-17: qty 1

## 2013-12-17 MED ORDER — PANTOPRAZOLE SODIUM 40 MG PO TBEC
40.0000 mg | DELAYED_RELEASE_TABLET | Freq: Every day | ORAL | Status: DC
  Administered 2013-12-17 – 2013-12-18 (×2): 40 mg via ORAL
  Filled 2013-12-17 (×2): qty 1

## 2013-12-17 MED ORDER — DONEPEZIL HCL 10 MG PO TABS
10.0000 mg | ORAL_TABLET | Freq: Every day | ORAL | Status: DC
  Administered 2013-12-17: 10 mg via ORAL
  Filled 2013-12-17 (×2): qty 1

## 2013-12-17 MED ORDER — ACETAMINOPHEN 325 MG PO TABS: 650.0000 mg | ORAL_TABLET | ORAL | Status: DC | PRN

## 2013-12-17 MED ORDER — ASPIRIN EC 325 MG PO TBEC
325.0000 mg | DELAYED_RELEASE_TABLET | Freq: Every day | ORAL | Status: DC
  Administered 2013-12-18: 325 mg via ORAL
  Filled 2013-12-17: qty 1

## 2013-12-17 MED ORDER — FUROSEMIDE 40 MG PO TABS
40.0000 mg | ORAL_TABLET | Freq: Every morning | ORAL | Status: DC
  Filled 2013-12-17: qty 1

## 2013-12-17 MED ORDER — GABAPENTIN 100 MG PO CAPS
100.0000 mg | ORAL_CAPSULE | Freq: Every day | ORAL | Status: DC
  Administered 2013-12-17: 100 mg via ORAL
  Filled 2013-12-17 (×2): qty 1

## 2013-12-17 MED ORDER — ENOXAPARIN SODIUM 40 MG/0.4ML ~~LOC~~ SOLN
40.0000 mg | SUBCUTANEOUS | Status: DC
  Administered 2013-12-17: 40 mg via SUBCUTANEOUS
  Filled 2013-12-17 (×2): qty 0.4

## 2013-12-17 MED ORDER — INFLUENZA VAC SPLIT QUAD 0.5 ML IM SUSY
0.5000 mL | PREFILLED_SYRINGE | INTRAMUSCULAR | Status: DC
  Filled 2013-12-17: qty 0.5

## 2013-12-17 MED ORDER — INSULIN ASPART 100 UNIT/ML ~~LOC~~ SOLN: 0.0000 [IU] | Freq: Every day | SUBCUTANEOUS | Status: DC

## 2013-12-17 MED ORDER — MEMANTINE HCL ER 28 MG PO CP24: 28.0000 mg | ORAL_CAPSULE | Freq: Every day | ORAL | Status: DC

## 2013-12-17 MED ORDER — FERROUS SULFATE 325 (65 FE) MG PO TBEC: 325.0000 mg | DELAYED_RELEASE_TABLET | Freq: Two times a day (BID) | ORAL | Status: DC

## 2013-12-17 MED ORDER — NITROGLYCERIN 0.4 MG SL SUBL: 0.4000 mg | SUBLINGUAL_TABLET | SUBLINGUAL | Status: DC | PRN

## 2013-12-17 MED ORDER — MEMANTINE HCL ER 28 MG PO CP24
28.0000 mg | ORAL_CAPSULE | Freq: Every day | ORAL | Status: DC
  Administered 2013-12-18: 28 mg via ORAL
  Filled 2013-12-17: qty 28

## 2013-12-17 MED ORDER — INSULIN ASPART 100 UNIT/ML ~~LOC~~ SOLN: 0.0000 [IU] | Freq: Three times a day (TID) | SUBCUTANEOUS | Status: DC

## 2013-12-17 MED ORDER — METOPROLOL TARTRATE 12.5 MG HALF TABLET
12.5000 mg | ORAL_TABLET | Freq: Every morning | ORAL | Status: DC
  Administered 2013-12-18: 12.5 mg via ORAL
  Filled 2013-12-17: qty 1

## 2013-12-17 MED ORDER — ONDANSETRON HCL 4 MG/2ML IJ SOLN: 4.0000 mg | Freq: Four times a day (QID) | INTRAMUSCULAR | Status: DC | PRN

## 2013-12-17 MED ORDER — MORPHINE SULFATE 2 MG/ML IJ SOLN: 2.0000 mg | INTRAMUSCULAR | Status: DC | PRN

## 2013-12-17 MED ORDER — ASPIRIN 81 MG PO CHEW
324.0000 mg | CHEWABLE_TABLET | Freq: Once | ORAL | Status: AC
  Administered 2013-12-17: 324 mg via ORAL
  Filled 2013-12-17: qty 4

## 2013-12-17 MED ORDER — FERROUS SULFATE 325 (65 FE) MG PO TABS
325.0000 mg | ORAL_TABLET | Freq: Two times a day (BID) | ORAL | Status: DC
  Administered 2013-12-18: 325 mg via ORAL
  Filled 2013-12-17 (×3): qty 1

## 2013-12-17 MED ORDER — DARIFENACIN HYDROBROMIDE ER 7.5 MG PO TB24
7.5000 mg | ORAL_TABLET | Freq: Every day | ORAL | Status: DC
  Administered 2013-12-18: 7.5 mg via ORAL
  Filled 2013-12-17: qty 1

## 2013-12-17 MED ORDER — FOLIC ACID 1 MG PO TABS
1.0000 mg | ORAL_TABLET | Freq: Two times a day (BID) | ORAL | Status: DC
  Administered 2013-12-17 – 2013-12-18 (×2): 1 mg via ORAL
  Filled 2013-12-17 (×3): qty 1

## 2013-12-17 NOTE — ED Provider Notes (Signed)
CSN: [id number]     Arrival date & time 12/17/13  1651 History   First MD Initiated Contact with Patient 12/17/13 1732     Chief Complaint  Patient presents with  . Chest Pain     (Consider location/radiation/quality/duration/timing/severity/associated sxs/prior Treatment) HPI 80 year old male presents with intermittent left-sided chest pain over last few days. He states seems to come and go with no particular reason. It is a sharp pain that lasts a few seconds to minutes. Denies any shortness of breath, diaphoresis, nausea, vomiting, or other associated symptoms. He has chronic lower extremity swelling that is not worse at this time. Patient denies any active chest pain this time. The symptoms started last few hours today. He sees a cardiologist for atrial fibrillation, [name], but does not have any known coronary disease.  Past Medical History  Diagnosis Date  . Arrhythmia     a fib  . ED (erectile dysfunction)   . Memory disorder   . 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 Hematochezia     has intermittent problem  . Arthritis     DJD knee - left  . Restless leg     treats with heat  . Cancer     bladder cancer - BCG [name]  . Diabetes mellitus     NIDDM on oral medication  . Genital warts     recurrent. Not a problem  . GERD (gastroesophageal reflux disease)     treated with PPI  . Hypertension     mild  . Neuropathy     burning discomfort feet/legs  . Irritable bladder    Past Surgical History  Procedure Laterality Date  . Cardiovascular stress test  03/28/2003    EF 55%.  Normal cardiolite study  . Appendectomy  1946  . Tonsillectomy  1938  . Joint replacement  1990's    left TKR  . Knee arthroscopy  1980's    right knee   Family History  Problem Relation Age of Onset  . Kidney disease Mother   . Heart disease Mother   . Stroke Mother   . Cancer Father     prostate  . Diabetes Neg Hx   . COPD Neg Hx   . Mental illness Brother     History  Substance Use Topics  . Smoking status: Former Smoker    Quit date: 08/28/1975  . Smokeless tobacco: Former Systems developer  . Alcohol Use: 4.2 oz/week    7 Shots of liquor per week    Review of Systems  Constitutional: Negative for fever.  Respiratory: Negative for cough and shortness of breath.   Cardiovascular: Positive for chest pain and leg swelling.  Gastrointestinal: Negative for vomiting.  All other systems reviewed and are negative.     Allergies  Cephalexin; Furacin; Macrodantin; and Zestril  Home Medications   Prior to Admission medications   Medication Sig Start Date End Date Taking? Authorizing Provider  acetaminophen (TYLENOL) 325 MG tablet Take 650 mg by mouth every 6 (six) hours as needed for pain or fever.    Historical Provider, MD  cholecalciferol (VITAMIN D) 1000 UNITS tablet Take 1,000 Units by mouth daily.    Historical Provider, MD  COD LIVER OIL PO Take 1 capsule by mouth daily.     Historical Provider, MD  donepezil (ARICEPT) 10 MG tablet Take 1 tablet (10 mg total) by mouth at bedtime. 10/25/13   Rowe Clack, MD  ferrous sulfate  325 (65 FE) MG EC tablet Take 325 mg by mouth 3 (three) times daily with meals. 12/14/11   Neena Rhymes, MD  folic acid (FOLVITE) 1 MG tablet Take 1 mg by mouth 2 (two) times daily.     Historical Provider, MD  furosemide (LASIX) 40 MG tablet Take 1 tablet (40 mg total) by mouth every morning. 06/28/13   Darlin Coco, MD  gabapentin (NEURONTIN) 100 MG capsule 1-3  Tabs qhs 07/26/13   Marcial Pacas, MD  glipiZIDE (GLUCOTROL) 5 MG tablet Take 1 tablet (5 mg total) by mouth daily. 12/06/13   Darlin Coco, MD  losartan (COZAAR) 50 MG tablet Take 50 mg by mouth daily.    Historical Provider, MD  Memantine HCl ER (NAMENDA XR) 28 MG CP24 Take 28 mg by mouth daily. 09/11/13   Marcial Pacas, MD  metoprolol tartrate (LOPRESSOR) 25 MG tablet Take 0.5 tablets (12.5 mg total) by mouth every morning. 02/12/13   Darlin Coco, MD   Multiple Vitamin (MULTIVITAMIN) tablet Take 1 tablet by mouth every morning.     Historical Provider, MD  Multiple Vitamins-Minerals (PRESERVISION AREDS 2) CAPS Take 1 capsule by mouth every morning.    Historical Provider, MD  nitroGLYCERIN (NITROSTAT) 0.4 MG SL tablet Place 1 tablet (0.4 mg total) under the tongue every 5 (five) minutes as needed for chest pain. 06/28/13   Darlin Coco, MD  pantoprazole (PROTONIX) 40 MG tablet Take 1 tablet (40 mg total) by mouth daily. 12/06/13   Darlin Coco, MD  Polyethylene Glycol 3350 (GLYCOLAX PO) Take 1 packet by mouth daily as needed. For constipation    Historical Provider, MD  solifenacin (VESICARE) 5 MG tablet Take 1 tablet (5 mg total) by mouth daily. 10/12/13   Marcial Pacas, MD   BP 128/61  Pulse 71  Temp(Src) 98.3 F (36.8 C)  Resp 19  Ht 6\' 3"  (1.905 m)  Wt 252 lb (114.306 kg)  BMI 31.50 kg/m2  SpO2 97% Physical Exam  Nursing note and vitals reviewed. Constitutional: He is oriented to person, place, and time. He appears well-developed and well-nourished.  HENT:  Head: Normocephalic and atraumatic.  Right Ear: External ear normal.  Left Ear: External ear normal.  Nose: Nose normal.  Eyes: Right eye exhibits no discharge. Left eye exhibits no discharge.  Neck: Neck supple.  Cardiovascular: Normal rate, regular rhythm, normal heart sounds and intact distal pulses.   Pulmonary/Chest: Effort normal and breath sounds normal. He has no wheezes. He has no rales. He exhibits no tenderness.  Abdominal: Soft. He exhibits no distension. There is no tenderness.  Musculoskeletal: He exhibits edema (Trace bilateral lower extremity pitting edema).  Neurological: He is alert and oriented to person, place, and time.  Skin: Skin is warm and dry.    ED Course  Procedures (including critical care time) Labs Review Labs Reviewed  BASIC METABOLIC PANEL - Abnormal; Notable for the following:    Sodium 133 (*)    Glucose, Bld 120 (*)    GFR calc  non Af Amer 81 (*)    All other components within normal limits  CBC - Abnormal; Notable for the following:    RBC 3.92 (*)    Hemoglobin 11.0 (*)    HCT 33.3 (*)    All other components within normal limits  PRO B NATRIURETIC PEPTIDE  I-STAT TROPOININ, ED    Imaging Review Dg Chest 2 View  12/17/2013   CLINICAL DATA:  Left sided sharp pain  EXAM: CHEST  2 VIEW  COMPARISON:  11/17/2012  FINDINGS: Stable enlarged cardiac silhouette. There is chronic bronchitic markings centrally. Lungs are mildly hyperinflated. Degenerate spurring of the spine noted.  IMPRESSION: 1. No acute cardiopulmonary findings. 2. Cardiomegaly and chronic bronchitic change.   Electronically Signed   By: Suzy Bouchard M.D.   On: 12/17/2013 18:15     EKG Interpretation   Date/Time:  Monday December 17 2013 17:03:29 EDT Ventricular Rate:  66 PR Interval:    QRS Duration: 110 QT Interval:  408 QTC Calculation: 427 R Axis:   91 Text Interpretation:  Atrial fibrillation Rightward axis Low voltage QRS  Abnormal ECG No significant change since last tracing Confirmed by  Universal ([id number]) on 12/17/2013 5:32:56 PM      MDM   Final diagnoses:  Precordial pain  Persistent atrial fibrillation  Benign hypertensive heart disease without heart failure  Dementia, without behavioral disturbance  Mitral regurgitation    Patient with atypical chest pain. Given his risk factors, including age, hypertension, diabetes, I feel he warrants observation in the hospital. Discussed with cardiology recommends hospitalist admission. Patient is pain-free at this time.    Ephraim Hamburger, MD 12/17/13 [phone]

## 2013-12-17 NOTE — ED Notes (Signed)
PT monitored by pulse ox, bp cuff, and 12-lead. 

## 2013-12-17 NOTE — ED Notes (Signed)
Pt states he has had a couple episodes of left sided cp over the past couple days. The pain is sharp in nature and denies any other symptoms.

## 2013-12-17 NOTE — H&P (Signed)
[facility] History and Physical  Patient: [name]  ZOX:[id number]  DOB: 04/17/1927  DOS: the patient was seen and examined on 12/17/2013 PCP: Adella Hare, MD  Chief Complaint: Chest pain  HPI: [name] is a 81 y.o. male with Past medical history of A. fib, dementia, anemia, GI bleed, chronic leg swelling, diabetes mellitus. The patient presented with complaints of chest pain. Patient mentions that in the afternoon when he was at rest he started having complaints of left-sided chest pain which was sharp and lasting for a few seconds. He had 3 episodes of similar pain on and off and patient's wife has given him nitroglycerin twice with resolution of his chest pain.Wife also mentions that patient had similar chest pain on Saturday. Patient denies any complaint of fever, chills, shortness of breath, cough, orthopnea, PND, nausea, vomiting, acid reflux, abdominal pain. No complaints of diarrhea or constipation, no active bleeding, no black color bowel movement, no burning urination. Patient is compliant with all his medication. [place] the beach and has given 4 hours yesterday with a break in between.  The patient is coming from home. And at his baseline independent for most of his ADL.  Review of Systems: as mentioned in the history of present illness.  A Comprehensive review of the other systems is negative.  Past Medical History  Diagnosis Date  . Arrhythmia     a fib  . ED (erectile dysfunction)   . Memory disorder   . 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 Hematochezia     has intermittent problem  . Arthritis     DJD knee - left  . Restless leg     treats with heat  . Cancer     bladder cancer - BCG [name]  . Diabetes mellitus     NIDDM on oral medication  . Genital warts     recurrent. Not a problem  . GERD (gastroesophageal reflux disease)     treated with PPI  . Hypertension     mild  . Neuropathy     burning discomfort  feet/legs  . Irritable bladder    Past Surgical History  Procedure Laterality Date  . Cardiovascular stress test  03/28/2003    EF 55%.  Normal cardiolite study  . Appendectomy  1946  . Tonsillectomy  1938  . Joint replacement  1990's    left TKR  . Knee arthroscopy  1980's    right knee   Social History:  reports that he quit smoking about 38 years ago. He has quit using smokeless tobacco. He reports that he drinks about 4.2 ounces of alcohol per week. He reports that he does not use illicit drugs.  Allergies  Allergen Reactions  . Cephalexin Other (See Comments)    Unknown reaction  . Furacin [Nitrofurazone] Other (See Comments)    Unknown reaction  . Macrodantin Other (See Comments)    Unknown reaction  . Zestril [Lisinopril] Other (See Comments)    Unknown reaction    Family History  Problem Relation Age of Onset  . Kidney disease Mother   . Heart disease Mother   . Stroke Mother   . Cancer Father     prostate  . Diabetes Neg Hx   . COPD Neg Hx   . Mental illness Brother     Prior to Admission medications   Medication Sig Start Date End Date Taking? Authorizing Provider  cholecalciferol (VITAMIN D)  1000 UNITS tablet Take 1,000 Units by mouth daily.   Yes Historical Provider, MD  COD LIVER OIL PO Take 1 capsule by mouth daily.    Yes Historical Provider, MD  donepezil (ARICEPT) 10 MG tablet Take 1 tablet (10 mg total) by mouth at bedtime. 10/25/13  Yes Rowe Clack, MD  ferrous sulfate 325 (65 FE) MG EC tablet Take 325 mg by mouth 2 (two) times daily.  12/14/11  Yes Neena Rhymes, MD  folic acid (FOLVITE) 1 MG tablet Take 1 mg by mouth 2 (two) times daily.    Yes Historical Provider, MD  furosemide (LASIX) 40 MG tablet Take 1 tablet (40 mg total) by mouth every morning. 06/28/13  Yes Darlin Coco, MD  gabapentin (NEURONTIN) 100 MG capsule Take 100 mg by mouth at bedtime.   Yes Historical Provider, MD  glipiZIDE (GLUCOTROL) 5 MG tablet Take 1 tablet (5 mg  total) by mouth daily. 12/06/13  Yes Darlin Coco, MD  losartan (COZAAR) 50 MG tablet Take 50 mg by mouth daily.   Yes Historical Provider, MD  Memantine HCl ER (NAMENDA XR) 28 MG CP24 Take 28 mg by mouth daily. 09/11/13  Yes Marcial Pacas, MD  metoprolol tartrate (LOPRESSOR) 25 MG tablet Take 0.5 tablets (12.5 mg total) by mouth every morning. 02/12/13  Yes Darlin Coco, MD  Multiple Vitamin (MULTIVITAMIN) tablet Take 1 tablet by mouth every morning.    Yes Historical Provider, MD  Multiple Vitamins-Minerals (PRESERVISION AREDS 2) CAPS Take 1 capsule by mouth every morning.   Yes Historical Provider, MD  nitroGLYCERIN (NITROSTAT) 0.4 MG SL tablet Place 1 tablet (0.4 mg total) under the tongue every 5 (five) minutes as needed for chest pain. 06/28/13  Yes Darlin Coco, MD  pantoprazole (PROTONIX) 40 MG tablet Take 1 tablet (40 mg total) by mouth daily. 12/06/13  Yes Darlin Coco, MD  Polyethylene Glycol 3350 (GLYCOLAX PO) Take 1 packet by mouth daily as needed. For constipation   Yes Historical Provider, MD  solifenacin (VESICARE) 5 MG tablet Take 1 tablet (5 mg total) by mouth daily. 10/12/13  Yes Marcial Pacas, MD    Physical Exam: Filed Vitals:   12/17/13 1845 12/17/13 1915 12/17/13 1930 12/17/13 1945  BP: 134/85 105/74 120/74 114/51  Pulse: 78 63 73 30  Temp:      Resp: 18 19 14 17   Height:      Weight:      SpO2: 96% 98% 97% 95%    General: Alert, Awake and Oriented to Time, Place and Person. Appear in mild distress Eyes: PERRL ENT: Oral Mucosa clear moist. Neck: no JVD Cardiovascular: S1 and S2 Present, no Murmur, Peripheral Pulses Present Respiratory: Bilateral Air entry equal and Decreased, bilateral basal Crackles, no wheezes Abdomen: Bowel Sound  present , Soft and Non tender Skin: no Rash Extremities:  bilateral  Pedal edema, no calf tenderness Neurologic: Grossly no focal neuro deficit.  Labs on Admission:  CBC:  Recent Labs Lab 12/17/13 1710  WBC 5.2  HGB  11.0*  HCT 33.3*  MCV 84.9  PLT 182    CMP     Component Value Date/Time   NA 133* 12/17/2013 1710   K 4.5 12/17/2013 1710   CL 96 12/17/2013 1710   CO2 27 12/17/2013 1710   GLUCOSE 120* 12/17/2013 1710   BUN 11 12/17/2013 1710   CREATININE 0.77 12/17/2013 1710   CALCIUM 8.9 12/17/2013 1710   PROT 6.9 12/20/2012 1622   ALBUMIN 3.8 12/20/2012 1622  AST 28 12/20/2012 1622   ALT 15 12/20/2012 1622   ALKPHOS 109 12/20/2012 1622   BILITOT 0.7 12/20/2012 1622   GFRNONAA 81* 12/17/2013 1710   GFRAA >90 12/17/2013 1710    No results found for this basename: LIPASE, AMYLASE,  in the last 168 hours No results found for this basename: AMMONIA,  in the last 168 hours  No results found for this basename: CKTOTAL, CKMB, CKMBINDEX, TROPONINI,  in the last 168 hours BNP (last 3 results)  Recent Labs  12/17/13 1710  PROBNP 573.8*    Radiological Exams on Admission: Dg Chest 2 View  12/17/2013   CLINICAL DATA:  Left sided sharp pain  EXAM: CHEST  2 VIEW  COMPARISON:  11/17/2012  FINDINGS: Stable enlarged cardiac silhouette. There is chronic bronchitic markings centrally. Lungs are mildly hyperinflated. Degenerate spurring of the spine noted.  IMPRESSION: 1. No acute cardiopulmonary findings. 2. Cardiomegaly and chronic bronchitic change.   Electronically Signed   By: Suzy Bouchard M.D.   On: 12/17/2013 18:15    EKG: Independently reviewed. atrial fibrillation, rate controlled. Assessment/Plan Principal Problem:   Chest pain Active Problems:   Atrial fibrillation   Iron deficiency anemia due to chronic blood loss   Diabetes mellitus   Dementia   Chronic venous insufficiency   Abnormality of gait   1. Chest pain  the patient is presenting with complaints of chest pain which is located on the left side and sharp.  Cardiology was initially considered for the patient recommended medicine admission. EKG and initial troponin are negative. Patient is currently chest pain-free. At present I  would continue him on aspirin and nitroglycerin as needed. Follow cartilage and recommendation for further workup. Check D-dimer now. Check echocardiogram in the morning Monitor on telemetry and serial troponin every 6 hours. N.p.o. after midnight.  2. A. fib Rate controlled at present. Continue current management. Patient is not on any anticoagulation due to GI bleed.  3. Diabetes mellitus. Hold oral hypoglycemic agent. Placing the patient on sliding scale.  4. Pedal edema Most likely due to chronic venous insufficiency. He d-dimer elevated will get lower extremity Doppler. Otherwise use SCD  Consults: Cardiology  DVT Prophylaxis: subcutaneous Heparin Nutrition: N.p.o. except medication  Code Status: Partial Patient and family wants the patient was resuscitated but does not want intubation  Family Communication: Wife was present at bedside, opportunity was given to ask question and all questions were answered satisfactorily at the time of interview. Disposition: Admitted to observation in telemetry unit.  Author: Berle Mull, MD [facility] Pager: [phone] 12/17/2013, 8:26 PM    If 7PM-7AM, please contact night-coverage [email or web address] Password [email or web address]

## 2013-12-17 NOTE — H&P (Signed)
Patient ID: BRECCAN GALANT MRN: [id number], DOB/AGE: 23-Sep-1927   Admit date: 12/17/2013   Primary Physician: Adella Hare, MD Primary Cardiologist: [name]  Pt. Profile:  80 year old gentleman with past medical history significant for dementia, obesity, permanent atrial fibrillation not on Coumadin due to history of GI bleed, diabetes, and history of chronic diastolic HF present with aypical chest pain  Problem List  Past Medical History  Diagnosis Date  . Arrhythmia     a fib  . ED (erectile dysfunction)   . Memory disorder   . 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 Hematochezia     has intermittent problem  . Arthritis     DJD knee - left  . Restless leg     treats with heat  . Cancer     bladder cancer - [facility] [name]  . Diabetes mellitus     NIDDM on oral medication  . Genital warts     recurrent. Not a problem  . GERD (gastroesophageal reflux disease)     treated with PPI  . Hypertension     mild  . Neuropathy     burning discomfort feet/legs  . Irritable bladder     Past Surgical History  Procedure Laterality Date  . Cardiovascular stress test  03/28/2003    EF 55%.  Normal cardiolite study  . Appendectomy  1946  . Tonsillectomy  1938  . Joint replacement  1990's    left TKR  . Knee arthroscopy  1980's    right knee     Allergies  Allergies  Allergen Reactions  . Cephalexin Other (See Comments)    Unknown reaction  . Furacin [Nitrofurazone] Other (See Comments)    Unknown reaction  . Macrodantin Other (See Comments)    Unknown reaction  . Zestril [Lisinopril] Other (See Comments)    Unknown reaction    HPI  Patient is an 80 year old gentleman with past medical history significant for dementia, obesity, permanent atrial fibrillation not on Coumadin due to history of GI bleed, diabetes, and history of chronic diastolic HF. The patient had cardiac catheterization on 08/07/2001 showed normal coronaries but ejection  fraction of 60-65%. His last echocardiogram on 05/02/2013 showed EF 55-60%, trivial aortic regurg, moderately dilated biatrial size. He was last seen by [name] in the office on 09/05/2013 at which time his peripheral edema has went down significantly and he also lost weight.  For the past few weeks, patient has been having a very atypical chest pain. He described as sharp stabbing chest discomfort over the left substernal area. It does not radiate and is not associated with any other symptom such as shortness breath or dizziness. The chest discomfort only lasted seconds at a time. It is not associated with exertion, however according to the patient, he has not been very active at home either. He denies any recent fever, chill, or shortness of breath. His lower extremity edema has been at his baseline for the past several weeks.  Due to concern of recurrent atypical chest discomfort, family brought the patient to [facility] ED on 12/17/2013 for further evaluation. Patient's blood pressure in the ED has been stable with 123/59. O2 saturation 98% on room air. Heart rate in the 70s. Significant labs include a sodium of 133, proBNP borderline at 573, initial troponin was negative. Hemoglobin was mildly decreased at 7.0. Chest x-ray showed no acute process. EKG showed atrial fibrillation rate controlled. Cardiology has been  consulted for atypical chest pain.  Home Medications  Prior to Admission medications   Medication Sig Start Date End Date Taking? Authorizing Provider  acetaminophen (TYLENOL) 325 MG tablet Take 650 mg by mouth every 6 (six) hours as needed for pain or fever.    Historical Provider, MD  cholecalciferol (VITAMIN D) 1000 UNITS tablet Take 1,000 Units by mouth daily.    Historical Provider, MD  COD LIVER OIL PO Take 1 capsule by mouth daily.     Historical Provider, MD  donepezil (ARICEPT) 10 MG tablet Take 1 tablet (10 mg total) by mouth at bedtime. 10/25/13   Rowe Clack, MD    ferrous sulfate 325 (65 FE) MG EC tablet Take 325 mg by mouth 3 (three) times daily with meals. 12/14/11   Neena Rhymes, MD  folic acid (FOLVITE) 1 MG tablet Take 1 mg by mouth 2 (two) times daily.     Historical Provider, MD  furosemide (LASIX) 40 MG tablet Take 1 tablet (40 mg total) by mouth every morning. 06/28/13   Darlin Coco, MD  gabapentin (NEURONTIN) 100 MG capsule 1-3  Tabs qhs 07/26/13   Marcial Pacas, MD  glipiZIDE (GLUCOTROL) 5 MG tablet Take 1 tablet (5 mg total) by mouth daily. 12/06/13   Darlin Coco, MD  losartan (COZAAR) 50 MG tablet Take 50 mg by mouth daily.    Historical Provider, MD  Memantine HCl ER (NAMENDA XR) 28 MG CP24 Take 28 mg by mouth daily. 09/11/13   Marcial Pacas, MD  metoprolol tartrate (LOPRESSOR) 25 MG tablet Take 0.5 tablets (12.5 mg total) by mouth every morning. 02/12/13   Darlin Coco, MD  Multiple Vitamin (MULTIVITAMIN) tablet Take 1 tablet by mouth every morning.     Historical Provider, MD  Multiple Vitamins-Minerals (PRESERVISION AREDS 2) CAPS Take 1 capsule by mouth every morning.    Historical Provider, MD  nitroGLYCERIN (NITROSTAT) 0.4 MG SL tablet Place 1 tablet (0.4 mg total) under the tongue every 5 (five) minutes as needed for chest pain. 06/28/13   Darlin Coco, MD  pantoprazole (PROTONIX) 40 MG tablet Take 1 tablet (40 mg total) by mouth daily. 12/06/13   Darlin Coco, MD  Polyethylene Glycol 3350 (GLYCOLAX PO) Take 1 packet by mouth daily as needed. For constipation    Historical Provider, MD  solifenacin (VESICARE) 5 MG tablet Take 1 tablet (5 mg total) by mouth daily. 10/12/13   Marcial Pacas, MD    Family History  Family History  Problem Relation Age of Onset  . Kidney disease Mother   . Heart disease Mother   . Stroke Mother   . Cancer Father     prostate  . Diabetes Neg Hx   . COPD Neg Hx   . Mental illness Brother     Social History  History   Social History  . Marital Status: Married    Spouse Name: [name]    Number  of Children: 4  . Years of Education: 10th    Occupational History  . retired    Social History Main Topics  . Smoking status: Former Smoker    Quit date: 08/28/1975  . Smokeless tobacco: Former Systems developer  . Alcohol Use: 4.2 oz/week    7 Shots of liquor per week  . Drug Use: No  . Sexual Activity: Not Currently   Other Topics Concern  . Not on file   Social History Narrative   Patient lives at home with his wife [name]) Married 88 years.  Retired   Education 10 th grade   Right handed   Caffeine one or two daily      Review of Systems General:  No chills, fever, night sweats or weight changes.  Cardiovascular:  No dyspnea on exertion, edema, orthopnea, palpitations, paroxysmal nocturnal dyspnea. +recurrent sharp stabbing pain lasting seconds at a time Dermatological: No rash, lesions/masses Respiratory: No cough, dyspnea Urologic: No hematuria, dysuria Abdominal:   No nausea, vomiting, diarrhea, bright red blood per rectum, melena, or hematemesis Neurologic:  No visual changes, wkns, changes in mental status. All other systems reviewed and are otherwise negative except as noted above.  Physical Exam  Blood pressure 120/74, pulse 73, temperature 98.3 F (36.8 C), resp. rate 14, height 6\' 3"  (1.905 m), weight 252 lb (114.306 kg), SpO2 97.00%.  General: Pleasant, NAD Psych: Normal affect. Neuro: Alert and oriented X 3. Moves all extremities spontaneously. HEENT: Normal  Neck: Supple without bruits +JVD Lungs:  Resp regular and unlabored, bibasilar rales Heart: RRR no s3, s4, or murmurs. Abdomen: Soft, non-tender, non-distended, BS + x 4.  Extremities: No clubbing, cyanosis or edema. DP/PT/Radials 2+ and equal bilaterally.  Labs  Troponin [facility] Test)  Recent Labs  12/17/13 1730  TROPIPOC 0.00    Lab Results  Component Value Date   WBC 5.2 12/17/2013   HGB 11.0* 12/17/2013   HCT 33.3* 12/17/2013   MCV 84.9 12/17/2013   PLT 182 12/17/2013    Recent  Labs Lab 12/17/13 1710  NA 133*  K 4.5  CL 96  CO2 27  BUN 11  CREATININE 0.77  CALCIUM 8.9  GLUCOSE 120*   Lab Results  Component Value Date   CHOL 129 12/20/2012   HDL 46.80 12/20/2012   LDLCALC 75 12/20/2012   TRIG 38.0 12/20/2012   No results found for this basename: DDIMER     Radiology/Studies  Dg Chest 2 View  12/17/2013   CLINICAL DATA:  Left sided sharp pain  EXAM: CHEST  2 VIEW  COMPARISON:  11/17/2012  FINDINGS: Stable enlarged cardiac silhouette. There is chronic bronchitic markings centrally. Lungs are mildly hyperinflated. Degenerate spurring of the spine noted.  IMPRESSION: 1. No acute cardiopulmonary findings. 2. Cardiomegaly and chronic bronchitic change.   Electronically Signed   By: Suzy Bouchard M.D.   On: 12/17/2013 18:15    ECG  EKG atrial fibrillation with heart rate in the 60s.  Echocardiogram 05/30/2013  LV EF: 55% - 60%  ------------------------------------------------------------ Indications: 427.31 Atrial Fibrillation.  ------------------------------------------------------------ History: PMH: Mitral regurgitation. Acquired from the patient and from the patient's chart. Atrial fibrillation. Mitral valve disease. Aortic valve disease. Risk factors: Former tobacco use. Diabetes mellitus. Obese.  ------------------------------------------------------------ Study Conclusions  - Left ventricle: The cavity size was normal. Wall thickness was normal. Systolic function was normal. The estimated ejection fraction was in the range of 55% to 60%. - Aortic valve: Trivial regurgitation. - Mitral valve: Calcified annulus. Mildly thickened leaflets . - Left atrium: The atrium was moderately dilated. - Right atrium: The atrium was moderately dilated. - Atrial septum: No defect or patent foramen ovale was identified. - Pulmonary arteries: PA peak pressure: 74mm Hg (S).      ASSESSMENT AND PLAN  1. atypical chest pain  - normal coronaries on  cath 2003  - EF preserved on recent echo in Mar 2015  - can potentially admit, trend enzyme, +/- stress test in am before discharge. Will discuss with MD  2. Dementia 3. permanent atrial fibrillation not on  Coumadin due to history of GI bleed 4. Diabetes 5. history of chronic diastolic HF: bibasilar rale, however negative CXR, ?if related to atelectasis   Signed, Almyra Deforest, PA-C 12/17/2013, 7:40 PM   The patient was seen, examined and discussed with Almyra Deforest, PA-C and I agree with the above.   80 year old male with h/o dementia, permanent a-fib on no anticoagulation (recurrent GIB), DM and hypertension who presented with chest pain that was sharp, non-exertional, in the left substernal area, lasting seconds,  No radiation, no other associated symptoms. He is followed by Dr Mare Ferrari, last seen in June 2015. No prior h/o CAd.  The pain sounds very atypical and considering age and underlying dementia we will not proceed with any further ischemic work up (stress test, CATH) unless there is elevation in troponins. LVEF normal on echo in March 2015, BP is controlled.   Dorothy Spark 12/17/2013

## 2013-12-17 NOTE — ED Notes (Signed)
Cardiologist at bedside.  

## 2013-12-18 ENCOUNTER — Encounter (HOSPITAL_COMMUNITY): Payer: Self-pay | Admitting: General Practice

## 2013-12-18 ENCOUNTER — Other Ambulatory Visit: Payer: Self-pay

## 2013-12-18 DIAGNOSIS — I119 Hypertensive heart disease without heart failure: Secondary | ICD-10-CM | POA: Diagnosis not present

## 2013-12-18 DIAGNOSIS — R072 Precordial pain: Secondary | ICD-10-CM | POA: Diagnosis not present

## 2013-12-18 DIAGNOSIS — I509 Heart failure, unspecified: Secondary | ICD-10-CM

## 2013-12-18 DIAGNOSIS — I5033 Acute on chronic diastolic (congestive) heart failure: Secondary | ICD-10-CM

## 2013-12-18 DIAGNOSIS — I4891 Unspecified atrial fibrillation: Secondary | ICD-10-CM | POA: Diagnosis not present

## 2013-12-18 DIAGNOSIS — R079 Chest pain, unspecified: Secondary | ICD-10-CM | POA: Diagnosis not present

## 2013-12-18 DIAGNOSIS — I5032 Chronic diastolic (congestive) heart failure: Secondary | ICD-10-CM | POA: Diagnosis present

## 2013-12-18 DIAGNOSIS — E118 Type 2 diabetes mellitus with unspecified complications: Secondary | ICD-10-CM | POA: Diagnosis not present

## 2013-12-18 DIAGNOSIS — I059 Rheumatic mitral valve disease, unspecified: Secondary | ICD-10-CM | POA: Diagnosis not present

## 2013-12-18 LAB — TROPONIN I: Troponin I: <0.3 ng/mL (ref <0.30)

## 2013-12-18 LAB — GLUCOSE, CAPILLARY
Glucose-Capillary: 100 mg/dL — ABNORMAL HIGH (ref 70–99)
Glucose-Capillary: 111 mg/dL — ABNORMAL HIGH (ref 70–99)

## 2013-12-18 LAB — HEMOGLOBIN A1C
Hgb A1c MFr Bld: 5.6 % (ref <5.7)
MEAN PLASMA GLUCOSE: 114 mg/dL (ref <117)

## 2013-12-18 MED ORDER — FUROSEMIDE 40 MG PO TABS
60.0000 mg | ORAL_TABLET | Freq: Every morning | ORAL | Status: DC
  Administered 2013-12-18: 60 mg via ORAL
  Filled 2013-12-18 (×2): qty 1

## 2013-12-18 MED ORDER — DONEPEZIL HCL 10 MG PO TABS: 10.0000 mg | ORAL_TABLET | Freq: Every day | ORAL | Status: DC

## 2013-12-18 MED ORDER — FUROSEMIDE 40 MG PO TABS: 60.0000 mg | ORAL_TABLET | Freq: Every morning | ORAL | Status: DC

## 2013-12-18 MED ORDER — ASPIRIN 325 MG PO TBEC: 325.0000 mg | DELAYED_RELEASE_TABLET | Freq: Every day | ORAL | Status: DC

## 2013-12-18 NOTE — Discharge Summary (Signed)
Physician Discharge Summary  [name] VEL:[id number] DOB: 07-30-1927 DOA: 12/17/2013  PCP: Adella Hare, MD  Admit date: 12/17/2013 Discharge date: 12/18/2013  Time spent: 65 minutes  Recommendations for Outpatient Follow-up:  1. Followup with [name] of cardiology in one week. On followup patient's volume status will need to be reassessed as patient's Lasix was increased to 60 mg daily per recommendations of cardiology. 2. Patient's chest pain also needs to be reassessed.  Discharge Diagnoses:  Principal Problem:   Chest pain Active Problems:   Acute on chronic diastolic CHF (congestive heart failure)   Atrial fibrillation   Iron deficiency anemia due to chronic blood loss   Diabetes mellitus   Dementia   Chronic venous insufficiency   Abnormality of gait   Discharge Condition: Stable and improved  Diet recommendation: Heart healthy.  Filed Weights   12/17/13 1704 12/17/13 2051 12/18/13 0555  Weight: 114.306 kg (252 lb) 109.77 kg (242 lb) 109.7 kg (241 lb 13.5 oz)    History of present illness:  [name] is a 80 y.o. male with Past medical history of A. fib, dementia, anemia, GI bleed, chronic leg swelling, diabetes mellitus.  The patient presented with complaints of chest pain. Patient mentioned that in the afternoon when he was at rest he started having complaints of left-sided chest pain which was sharp and lasting for a few seconds. He had 3 episodes of similar pain on and off and patient's wife has given him nitroglycerin twice with resolution of his chest pain.Wife also mentioned that patient had similar chest pain on Saturday.  Patient denied any complaint of fever, chills, shortness of breath, cough, orthopnea, PND, nausea, vomiting, acid reflux, abdominal pain.  No complaints of diarrhea or constipation, no active bleeding, no black color bowel movement, no burning urination.  Patient is compliant with all his medications.  [place]  had driven 4 hours with a break in between, one day prior to admission.  The patient was coming from home. And at his baseline independent for most of his ADL   Hospital Course:  #1 chest pain Patient had presented with complaints of substernal chest pain occurring at rest on the left side sharp and lasting a few seconds. Patient had 3 episodes on and off his wife had given him some nitroglycerin with resolution of his chest pain. Patient was admitted to telemetry for chest pain rule out. Cardiology was consulted and followed the patient during the hospitalization. Cardiac enzymes were cycled which were negative x3. D. dimer was obtained which was negative. It was felt per cardiology the patient's chest was atypical. Patient was noted to have a 2-D echo done in March of 2015 with EF of 55-60% with no wall motion abnormalities. Due to recent 2-D echo 2-D echo was not repeated. Patient remained chest pain-free for the reservoir hospitalization. Patient's Lasix dose was increased to 60 mg daily as was felt patient had a mild acute on chronic diastolic CHF. It was recommended per cardiology that if cardiac enzymes were negative no further ischemic workup was needed at this time. Patient was subsequently discharged home and is to followup with his primary cardiologist [name] in 1 week. Patient be discharged in stable and improved condition.  #2 acute on chronic diastolic mild CHF Patient was noted on exam to have some bibasilar crackles. Cardiac enzymes which was cycled were negative x3. Patient had a recent 2-D echo done in March of 2015 with EF of 55-60%.  Patient's home Lasix dose was recommended to be increased to 60 mg daily per cardiology. It was felt patient was Laurie Panda to be discharged home from a cardiac standpoint and will followup with his primary cardiologist in 1 week. Patient be discharged in stable and improved condition.  #3 atrial fibrillation Patient's rate remained controlled during  the hospitalization and patient was maintained on Lopressor for rate control. Patient was being on anticoagulation candidate secondary to history of recurrent GI bleed. Patient was maintained on aspirin.  #4 diabetes mellitus Patient was maintained on a sliding scale insulin during the hospitalization.  Procedures:  Chest x-ray 12/17/2013  Consultations:  Cardiology: Dr. Meda Coffee 12/17/2013  Discharge Exam: Filed Vitals:   12/18/13 1200  BP: 127/61  Pulse: 73  Temp: 98 F (36.7 C)  Resp: 18    General: NAD Cardiovascular: Irregularly irregular with 3/6 SEM Respiratory: Bibasilar crackles  Discharge Instructions You were cared for by a hospitalist during your hospital stay. If you have any questions about your discharge medications or the care you received while you were in the hospital after you are discharged, you can call the unit and asked to speak with the hospitalist on call if the hospitalist that took care of you is not available. Once you are discharged, your primary care physician will handle any further medical issues. Please note that NO REFILLS for any discharge medications will be authorized once you are discharged, as it is imperative that you return to your primary care physician (or establish a relationship with a primary care physician if you do not have one) for your aftercare needs so that they can reassess your need for medications and monitor your lab values.  Discharge Instructions   Diet - low sodium heart healthy    Complete by:  As directed      Discharge instructions    Complete by:  As directed   Follow up with Dr Mare Ferrari in 1 week     Increase activity slowly    Complete by:  As directed           Current Discharge Medication List    START taking these medications   Details  aspirin EC 325 MG EC tablet Take 1 tablet (325 mg total) by mouth daily. Qty: 30 tablet, Refills: 0      CONTINUE these medications which have CHANGED   Details   furosemide (LASIX) 40 MG tablet Take 1.5 tablets (60 mg total) by mouth every morning. Qty: 60 tablet, Refills: 0   Associated Diagnoses: Edema      CONTINUE these medications which have NOT CHANGED   Details  cholecalciferol (VITAMIN D) 1000 UNITS tablet Take 1,000 Units by mouth daily.    COD LIVER OIL PO Take 1 capsule by mouth daily.     donepezil (ARICEPT) 10 MG tablet Take 1 tablet (10 mg total) by mouth at bedtime. Qty: 90 tablet, Refills: 0    ferrous sulfate 325 (65 FE) MG EC tablet Take 325 mg by mouth 2 (two) times daily.     folic acid (FOLVITE) 1 MG tablet Take 1 mg by mouth 2 (two) times daily.     gabapentin (NEURONTIN) 100 MG capsule Take 100 mg by mouth at bedtime.    glipiZIDE (GLUCOTROL) 5 MG tablet Take 1 tablet (5 mg total) by mouth daily. Qty: 90 tablet, Refills: 3   Associated Diagnoses: Type II or unspecified type diabetes mellitus without mention of complication, not stated as uncontrolled    losartan (  COZAAR) 50 MG tablet Take 50 mg by mouth daily.    Memantine HCl ER (NAMENDA XR) 28 MG CP24 Take 28 mg by mouth daily. Qty: 90 capsule, Refills: 4    metoprolol tartrate (LOPRESSOR) 25 MG tablet Take 0.5 tablets (12.5 mg total) by mouth every morning. Qty: 30 tablet, Refills: 5    Multiple Vitamin (MULTIVITAMIN) tablet Take 1 tablet by mouth every morning.     Multiple Vitamins-Minerals (PRESERVISION AREDS 2) CAPS Take 1 capsule by mouth every morning.    nitroGLYCERIN (NITROSTAT) 0.4 MG SL tablet Place 1 tablet (0.4 mg total) under the tongue every 5 (five) minutes as needed for chest pain. Qty: 100 tablet, Refills: 0    pantoprazole (PROTONIX) 40 MG tablet Take 1 tablet (40 mg total) by mouth daily. Qty: 90 tablet, Refills: 0    Polyethylene Glycol 3350 (GLYCOLAX PO) Take 1 packet by mouth daily as needed. For constipation    solifenacin (VESICARE) 5 MG tablet Take 1 tablet (5 mg total) by mouth daily. Qty: 90 tablet, Refills: 1        Allergies  Allergen Reactions  . Cephalexin Other (See Comments)    Unknown reaction  . Furacin [Nitrofurazone] Other (See Comments)    Unknown reaction  . Macrodantin Other (See Comments)    Unknown reaction  . Zestril [Lisinopril] Other (See Comments)    Unknown reaction   Follow-up Information   Follow up with Darlin Coco, MD. Schedule an appointment as soon as possible for a visit in 1 week.   Specialty:  Cardiology   Contact information:   [place] [address] [place] [address] [phone]        The results of significant diagnostics from this hospitalization (including imaging, microbiology, ancillary and laboratory) are listed below for reference.    Significant Diagnostic Studies: Dg Chest 2 View  12/17/2013   CLINICAL DATA:  Left sided sharp pain  EXAM: CHEST  2 VIEW  COMPARISON:  11/17/2012  FINDINGS: Stable enlarged cardiac silhouette. There is chronic bronchitic markings centrally. Lungs are mildly hyperinflated. Degenerate spurring of the spine noted.  IMPRESSION: 1. No acute cardiopulmonary findings. 2. Cardiomegaly and chronic bronchitic change.   Electronically Signed   By: Suzy Bouchard M.D.   On: 12/17/2013 18:15    Microbiology: No results found for this or any previous visit (from the past 240 hour(s)).   Labs: Basic Metabolic Panel:  Recent Labs Lab 12/17/13 1710  NA 133*  K 4.5  CL 96  CO2 27  GLUCOSE 120*  BUN 11  CREATININE 0.77  CALCIUM 8.9   Liver Function Tests: No results found for this basename: AST, ALT, ALKPHOS, BILITOT, PROT, ALBUMIN,  in the last 168 hours No results found for this basename: LIPASE, AMYLASE,  in the last 168 hours No results found for this basename: AMMONIA,  in the last 168 hours CBC:  Recent Labs Lab 12/17/13 1710  WBC 5.2  HGB 11.0*  HCT 33.3*  MCV 84.9  PLT 182   Cardiac Enzymes:  Recent Labs Lab 12/17/13 2124 12/18/13 0242 12/18/13 0725  TROPONINI <0.30 <0.30 <0.30    BNP: BNP (last 3 results)  Recent Labs  12/17/13 1710  PROBNP 573.8*   CBG:  Recent Labs Lab 12/17/13 2042 12/18/13 0741 12/18/13 1148  GLUCAP 100* 100* 111*       Signed:  Gayle Collard MD [facility] 12/18/2013, 1:29 PM

## 2013-12-18 NOTE — Progress Notes (Addendum)
Seen and examined. IRR IRR, 3/6 SEM At apex, lungs with basilar crackles, bilateral 1+ CVI of both legs. Ruled out overnight. Please refer to consult yesterday. Appears to be in mild decompensated diastolic CHF. Echo in 05/2013 with normal LV function. Would recommend increase lasix to 60 mg daily. Ok to followup with [name] as an outpatient.  Pixie Casino, MD, [facility] Attending Cardiologist [place]

## 2013-12-18 NOTE — Progress Notes (Signed)
UR completed 

## 2013-12-19 ENCOUNTER — Telehealth: Payer: Self-pay | Admitting: *Deleted

## 2013-12-19 ENCOUNTER — Telehealth: Payer: Self-pay | Admitting: Cardiology

## 2013-12-19 NOTE — Telephone Encounter (Signed)
NEW PROBLEM:   Pt's wife called and would like pt to be seen with in the next 7days.   Pt's wife asked for a call back with and appt for RN.   I offered PA wife needs sooner, did not like to time frame.

## 2013-12-19 NOTE — Telephone Encounter (Signed)
Transition Care Management Follow-up Telephone Call D/C on 12/18/13  How have you been since you were released from the hospital? Spoke with wife she stated he seem to be doing ok. Didn't sleep well last night   Do you understand why you were in the hospital? YES, they understood why he was admitted    Do you understand the discharge instrcutions? YES, they understood d/c summary  Items Reviewed:  Medications reviewed: YES, reviewed medications she stated haven't started the aspirin yet son will get today  Allergies reviewed: YES, reviewed  Dietary changes reviewed: YES, heart healthy  Referrals reviewed: No referral recommended   Functional Questionnaire:   Activities of Daily Living (ADLs):   Wife states he is independent in the following bathing, dressing himself, but she helps Wife states they require no  assistance         Any transportation issues/concerns?: NO   Any patient concerns? YES, wanted to see about getting some physical therapy. Inform wife can address tomorrow at appt with Dr. Doug Sou  Confirmed importance and date/time of follow-up visits scheduled: YES, advise to keep appt for tomorrow with Dr. Doug Sou   Confirmed with patient if condition begins to worsen call PCP or go to the ER.  Patient was given the Call-a-Nurse line [phone]: YES

## 2013-12-19 NOTE — Telephone Encounter (Signed)
Patient recently in hospital for CHF and chest pain. Discharge instructions reviewed with wife. No questions at this time. TCM appointment scheduled for Monday 12/24/13 with Tera Helper NP. Appointment date and time give to wife.

## 2013-12-20 ENCOUNTER — Ambulatory Visit (INDEPENDENT_AMBULATORY_CARE_PROVIDER_SITE_OTHER): Payer: Medicare Other | Admitting: Internal Medicine

## 2013-12-20 ENCOUNTER — Encounter: Payer: Self-pay | Admitting: Internal Medicine

## 2013-12-20 VITALS — BP 108/62 | HR 81 | Temp 97.9°F | Resp 22 | Ht 75.0 in | Wt 239.0 lb

## 2013-12-20 DIAGNOSIS — F0390 Unspecified dementia without behavioral disturbance: Secondary | ICD-10-CM

## 2013-12-20 DIAGNOSIS — I5033 Acute on chronic diastolic (congestive) heart failure: Secondary | ICD-10-CM

## 2013-12-20 DIAGNOSIS — R269 Unspecified abnormalities of gait and mobility: Secondary | ICD-10-CM

## 2013-12-20 DIAGNOSIS — G309 Alzheimer's disease, unspecified: Secondary | ICD-10-CM | POA: Diagnosis not present

## 2013-12-20 DIAGNOSIS — F0280 Dementia in other diseases classified elsewhere without behavioral disturbance: Secondary | ICD-10-CM | POA: Diagnosis not present

## 2013-12-20 NOTE — Patient Instructions (Signed)
We would like you to continue taking the 1.5 pills of the lasix until you see the cardiologist back next week.  We will see you back in about 4 months to check on things. We have sent in an order for physical therapy to come to the house. We are not changing you medicines or drawing blood work today.  Fall Prevention and Home Safety Falls cause injuries and can affect all age groups. It is possible to use preventive measures to significantly decrease the likelihood of falls. There are many simple measures which can make your home safer and prevent falls. OUTDOORS  Repair cracks and edges of walkways and driveways.  Remove high doorway thresholds.  Trim shrubbery on the main path into your home.  Have good outside lighting.  Clear walkways of tools, rocks, debris, and clutter.  Check that handrails are not broken and are securely fastened. Both sides of steps should have handrails.  Have leaves, snow, and ice cleared regularly.  Use sand or salt on walkways during winter months.  In the garage, clean up grease or oil spills. BATHROOM  Install night lights.  Install grab bars by the toilet and in the tub and shower.  Use non-skid mats or decals in the tub or shower.  Place a plastic non-slip stool in the shower to sit on, if needed.  Keep floors dry and clean up all water on the floor immediately.  Remove soap buildup in the tub or shower on a regular basis.  Secure bath mats with non-slip, double-sided rug tape.  Remove throw rugs and tripping hazards from the floors. BEDROOMS  Install night lights.  Make sure a bedside light is easy to reach.  Do not use oversized bedding.  Keep a telephone by your bedside.  Have a firm chair with side arms to use for getting dressed.  Remove throw rugs and tripping hazards from the floor. KITCHEN  Keep handles on pots and pans turned toward the center of the stove. Use back burners when possible.  Clean up spills quickly  and allow time for drying.  Avoid walking on wet floors.  Avoid hot utensils and knives.  Position shelves so they are not too high or low.  Place commonly used objects within easy reach.  If necessary, use a sturdy step stool with a grab bar when reaching.  Keep electrical cables out of the way.  Do not use floor polish or wax that makes floors slippery. If you must use wax, use non-skid floor wax.  Remove throw rugs and tripping hazards from the floor. STAIRWAYS  Never leave objects on stairs.  Place handrails on both sides of stairways and use them. Fix any loose handrails. Make sure handrails on both sides of the stairways are as long as the stairs.  Check carpeting to make sure it is firmly attached along stairs. Make repairs to worn or loose carpet promptly.  Avoid placing throw rugs at the top or bottom of stairways, or properly secure the rug with carpet tape to prevent slippage. Get rid of throw rugs, if possible.  Have an electrician put in a light switch at the top and bottom of the stairs. OTHER FALL PREVENTION TIPS  Wear low-heel or rubber-soled shoes that are supportive and fit well. Wear closed toe shoes.  When using a stepladder, make sure it is fully opened and both spreaders are firmly locked. Do not climb a closed stepladder.  Add color or contrast paint or tape to grab bars  and handrails in your home. Place contrasting color strips on first and last steps.  Learn and use mobility aids as needed. Install an electrical emergency response system.  Turn on lights to avoid dark areas. Replace light bulbs that burn out immediately. Get light switches that glow.  Arrange furniture to create clear pathways. Keep furniture in the same place.  Firmly attach carpet with non-skid or double-sided tape.  Eliminate uneven floor surfaces.  Select a carpet pattern that does not visually hide the edge of steps.  Be aware of all pets. OTHER HOME SAFETY TIPS  Set  the water temperature for 120 F (48.8 C).  Keep emergency numbers on or near the telephone.  Keep smoke detectors on every level of the home and near sleeping areas. Document Released: 02/26/2002 Document Revised: 09/07/2011 Document Reviewed: 05/28/2011 [facility] Patient Information 2015 [place], [place]. This information is not intended to replace advice given to you by your health care provider. Make sure you discuss any questions you have with your health care provider.

## 2013-12-20 NOTE — Progress Notes (Signed)
Pre visit review using our clinic review tool, if applicable. No additional management support is needed unless otherwise documented below in the visit note. 

## 2013-12-21 ENCOUNTER — Telehealth: Payer: Self-pay | Admitting: Cardiology

## 2013-12-21 NOTE — Telephone Encounter (Signed)
New Message  Pt wife called. Requests to resch appt with Cecille Rubin from 10/05 and come in to see app or [name] within one week. No appt's aavailable. Pt wife requests a call back to discuss,

## 2013-12-21 NOTE — Addendum Note (Signed)
Addended by: Vertell Novak A on: 12/21/2013 02:12 PM   Modules accepted: Level of Service

## 2013-12-21 NOTE — Progress Notes (Signed)
   Subjective:    Patient ID: [name], male    DOB: March 07, 1928, 80 y.o.   MRN: [id number]  HPI The patient is an 80 YO man who is coming in today for hospital follow up. He was seen for arm and leg pain. That pain has resolved. He is walking with walker at home but has still been unsteady on his feet. They have a caregiver at home to help and his memory is very poor. Most of the history is provided by his wife. He has no complaints. No SOB or swelling of his legs.   Review of Systems  Constitutional: Negative for fever, activity change, appetite change and fatigue.  Respiratory: Negative for cough, chest tightness, shortness of breath and wheezing.   Cardiovascular: Positive for leg swelling. Negative for chest pain and palpitations.  Gastrointestinal: Negative for abdominal pain, diarrhea, constipation and abdominal distention.  Musculoskeletal: Positive for gait problem. Negative for arthralgias and myalgias.  Neurological: Positive for weakness. Negative for dizziness, light-headedness and headaches.      Objective:   Physical Exam  Constitutional: He appears well-developed and well-nourished. No distress.  HENT:  Head: Normocephalic and atraumatic.  Eyes: EOM are normal.  Neck: Normal range of motion.  Cardiovascular: Normal rate and regular rhythm.   Pulmonary/Chest: Effort normal and breath sounds normal. No respiratory distress. He has no wheezes. He has no rales.  Abdominal: Soft. Bowel sounds are normal.  Musculoskeletal: He exhibits edema.  1-2+ edema in the legs bilaterally and hardened skin around the ankles bilaterally with some color change suggesting chronic congestion.  Neurological: He is alert. Coordination abnormal.  Skin: Skin is warm and dry.   Filed Vitals:   12/20/13 1326  BP: 108/62  Pulse: 81  Temp: 97.9 F (36.6 C)  TempSrc: Oral  Resp: 22  Height: 6\' 3"  (1.905 m)  Weight: 239 lb (108.41 kg)  SpO2: 96%      Assessment & Plan:

## 2013-12-21 NOTE — Assessment & Plan Note (Signed)
He is still taking 60 mg lasix per day and would continue as he still has some fluid in his legs until visit with cardiology next week. No pain in his arms and legs at this time. Have ordered PT and OT for the house to help with his mobility.

## 2013-12-21 NOTE — Telephone Encounter (Signed)
Noted  

## 2013-12-21 NOTE — Assessment & Plan Note (Signed)
Pt and OT ordered for gait training and stability. He is using walker but not too stable at this time.

## 2013-12-21 NOTE — Assessment & Plan Note (Signed)
They do have a caregiver in the home that helps to remember their medicines. Both the patient and his wife are having memory problems although the patient is likely worse than his wife.

## 2013-12-21 NOTE — Telephone Encounter (Signed)
Follow Up   Pt will keep appt//sr

## 2013-12-24 ENCOUNTER — Encounter: Payer: Self-pay | Admitting: Nurse Practitioner

## 2013-12-24 ENCOUNTER — Ambulatory Visit (INDEPENDENT_AMBULATORY_CARE_PROVIDER_SITE_OTHER): Payer: Medicare Other | Admitting: Nurse Practitioner

## 2013-12-24 VITALS — BP 118/58 | HR 64 | Ht 75.5 in | Wt 237.4 lb

## 2013-12-24 DIAGNOSIS — I872 Venous insufficiency (chronic) (peripheral): Secondary | ICD-10-CM | POA: Diagnosis not present

## 2013-12-24 DIAGNOSIS — F0390 Unspecified dementia without behavioral disturbance: Secondary | ICD-10-CM | POA: Diagnosis not present

## 2013-12-24 DIAGNOSIS — R0789 Other chest pain: Secondary | ICD-10-CM | POA: Diagnosis not present

## 2013-12-24 LAB — BASIC METABOLIC PANEL
BUN: 14 mg/dL (ref 6–23)
CO2: 30 mEq/L (ref 19–32)
Calcium: 9.1 mg/dL (ref 8.4–10.5)
Chloride: 98 mEq/L (ref 96–112)
Creatinine, Ser: 1 mg/dL (ref 0.4–1.5)
GFR: 76.42 mL/min (ref >60.00)
Glucose, Bld: 83 mg/dL (ref 70–99)
Potassium: 3.8 mEq/L (ref 3.5–5.1)
Sodium: 133 mEq/L — ABNORMAL LOW (ref 135–145)

## 2013-12-24 NOTE — Progress Notes (Signed)
[name] Date of Birth: [birth year] Medical Record #[id number]  History of Present Illness: [name] is seen back today for a TOC visit. Seen for [name]. He is an 80 year old male with chronic AF, no longer on anticoagulation except for aspirin due to GI bleeding. Other issues include obesity, anemia, dementia, chronic edema, DM, valvular heart disease and advanced age.  Last seen here in June. Felt to be doing ok.  Most recently at [facility] with chest pain. Very atypical. Negative evaluation. Had had recent echo noted and reviewed. Some crackles on exam and Lasix was increased. He was advised to follow up with cardiology.  Comes in today. Here with his wife and caregiver. Patient is a very poor historian - has dementia. Wife gives most of the history and she admits that she is not "good at remembering as well". They do have a new caregiver working with them. Does not sound like he is having any more chest pain. Weight is down a few pounds. Not short of breath. Using his walker.   Current Outpatient Prescriptions  Medication Sig Dispense Refill  . aspirin EC 325 MG EC tablet Take 1 tablet (325 mg total) by mouth daily.  30 tablet  0  . cholecalciferol (VITAMIN D) 1000 UNITS tablet Take 1,000 Units by mouth daily.      . COD LIVER OIL PO Take 1 capsule by mouth daily.       [name] donepezil (ARICEPT) 10 MG tablet Take 1 tablet (10 mg total) by mouth at bedtime.  30 tablet  0  . ferrous sulfate 325 (65 FE) MG EC tablet Take 325 mg by mouth 2 (two) times daily.       . folic acid (FOLVITE) 1 MG tablet Take 1 mg by mouth 2 (two) times daily.       . furosemide (LASIX) 40 MG tablet Take 1.5 tablets (60 mg total) by mouth every morning.  60 tablet  0  . gabapentin (NEURONTIN) 100 MG capsule Take 100 mg by mouth at bedtime.      [name] glipiZIDE (GLUCOTROL) 5 MG tablet Take 1 tablet (5 mg total) by mouth daily.  90 tablet  3  . losartan (COZAAR) 50 MG tablet Take 50 mg by mouth daily.      .  Memantine HCl ER (NAMENDA XR) 28 MG CP24 Take 28 mg by mouth daily.  90 capsule  4  . metoprolol tartrate (LOPRESSOR) 25 MG tablet Take 0.5 tablets (12.5 mg total) by mouth every morning.  30 tablet  5  . Multiple Vitamin (MULTIVITAMIN) tablet Take 1 tablet by mouth every morning.       . Multiple Vitamins-Minerals (PRESERVISION AREDS 2) CAPS Take 1 capsule by mouth every morning.      . nitroGLYCERIN (NITROSTAT) 0.4 MG SL tablet Place 1 tablet (0.4 mg total) under the tongue every 5 (five) minutes as needed for chest pain.  100 tablet  0  . pantoprazole (PROTONIX) 40 MG tablet Take 1 tablet (40 mg total) by mouth daily.  90 tablet  0  . Polyethylene Glycol 3350 (GLYCOLAX PO) Take 1 packet by mouth daily as needed. For constipation      . solifenacin (VESICARE) 5 MG tablet Take 1 tablet (5 mg total) by mouth daily.  90 tablet  1  . [DISCONTINUED] oxybutynin (DITROPAN XL) 15 MG 24 hr tablet Take 1 tablet (15 mg total) by mouth daily.  90 tablet  3   No current  facility-administered medications for this visit.    Allergies  Allergen Reactions  . Cephalexin Other (See Comments)    Unknown reaction  . Furacin [Nitrofurazone] Other (See Comments)    Unknown reaction  . Macrodantin Other (See Comments)    Unknown reaction  . Zestril [Lisinopril] Other (See Comments)    Unknown reaction    Past Medical History  Diagnosis Date  . Arrhythmia     a fib  . ED (erectile dysfunction)   . Memory disorder   . 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 Hematochezia     has intermittent problem  . Arthritis     DJD knee - left  . Restless leg     treats with heat  . Cancer     bladder cancer - [facility] [name]  . Diabetes mellitus     NIDDM on oral medication  . Genital warts     recurrent. Not a problem  . GERD (gastroesophageal reflux disease)     treated with PPI  . Hypertension     mild  . Neuropathy     burning discomfort feet/legs  . Irritable bladder     Past  Surgical History  Procedure Laterality Date  . Cardiovascular stress test  03/28/2003    EF 55%.  Normal cardiolite study  . Appendectomy  1946  . Tonsillectomy  1938  . Joint replacement  1990's    left TKR  . Knee arthroscopy  1980's    right knee    History  Smoking status  . Former Smoker  . Quit date: 08/28/1975  Smokeless tobacco  . Former User    History  Alcohol Use  . 4.2 oz/week  . 7 Shots of liquor per week    Family History  Problem Relation Age of Onset  . Kidney disease Mother   . Heart disease Mother   . Stroke Mother   . Cancer Father     prostate  . Diabetes Neg Hx   . COPD Neg Hx   . Mental illness Brother     Review of Systems: The review of systems is per the HPI.  All other systems were reviewed and are negative.  Physical Exam: BP 118/58  Pulse 64  Ht 6' 3.5" (1.918 m)  Wt 237 lb 6.4 oz (107.684 kg)  BMI 29.27 kg/m2 Patient is very pleasant and in no acute distress. He has dementia and is a poor historian. Skin is warm and dry. Color is normal.  HEENT is unremarkable. Normocephalic/atraumatic. PERRL. Sclera are nonicteric. Neck is supple. No masses. No JVD. Lungs are clear. Cardiac exam shows an irregular rhythm. His rate is ok. Abdomen is soft. Extremities are with chronic edema. Gait and ROM are intact. No gross neurologic deficits noted.  Wt Readings from Last 3 Encounters:  12/24/13 237 lb 6.4 oz (107.684 kg)  12/20/13 239 lb (108.41 kg)  12/18/13 241 lb 13.5 oz (109.7 kg)    LABORATORY DATA/PROCEDURES: BMET pending  Lab Results  Component Value Date   WBC 5.2 12/17/2013   HGB 11.0* 12/17/2013   HCT 33.3* 12/17/2013   PLT 182 12/17/2013   GLUCOSE 120* 12/17/2013   CHOL 129 12/20/2012   TRIG 38.0 12/20/2012   HDL 46.80 12/20/2012   LDLCALC 75 12/20/2012   ALT 15 12/20/2012   AST 28 12/20/2012   NA 133* 12/17/2013   K 4.5 12/17/2013   CL 96 12/17/2013   CREATININE 0.77 12/17/2013  BUN 11 12/17/2013   CO2 27 12/17/2013   TSH 3.710  06/05/2013   INR 1.31 07/22/2009   HGBA1C 5.6 12/18/2013    BNP (last 3 results)  Recent Labs  12/17/13 1710  PROBNP 573.8*   Echo Study Conclusions from March 2015  - Left ventricle: The cavity size was normal. Wall thickness was normal. Systolic function was normal. The estimated ejection fraction was in the range of 55% to 60%. - Aortic valve: Trivial regurgitation. - Mitral valve: Calcified annulus. Mildly thickened leaflets . - Left atrium: The atrium was moderately dilated. - Right atrium: The atrium was moderately dilated. - Atrial septum: No defect or patent foramen ovale was identified. - Pulmonary arteries: PA peak pressure: 47mm Hg (S).    Assessment / Plan: 1. Atypical chest pain - sounds like this is resolved.  2. Mild volume overload - on 60 mg of Lasix - recheck BMET today - weight is down. Will leave him on his current regimen.  3. Chronic AF  4. Past GI bleeding with chronic anemia  See back as planned next month.   Patient is agreeable to this plan and will call if any problems develop in the interim.   [name], RN, [place] [address] [place], [place]  [phone]

## 2013-12-24 NOTE — Patient Instructions (Signed)
We will be checking the following labs today BMET  Stay on your current medicines  See [name] back as planned  Call the [place] office at [phone] if you have any questions, problems or concerns.

## 2013-12-25 ENCOUNTER — Telehealth: Payer: Self-pay | Admitting: *Deleted

## 2013-12-25 NOTE — Telephone Encounter (Signed)
Wife states when husband saw md they talked about husband getting set-up for physical therapy. She is wanting to know was this approved and what's the next step...[name]

## 2013-12-25 NOTE — Telephone Encounter (Signed)
[facility] stated there is no referral orders for home health PT/OT...[name]

## 2013-12-25 NOTE — Telephone Encounter (Signed)
Notified pt wife referral has been placed will received call from [facility] with appt, date & time...[name]

## 2013-12-25 NOTE — Telephone Encounter (Signed)
Order was placed at visit for home health PT/OT. I do not know status.

## 2013-12-25 NOTE — Addendum Note (Signed)
Addended by: Vertell Novak A on: 12/25/2013 04:41 PM   Modules accepted: Orders

## 2013-12-25 NOTE — Telephone Encounter (Signed)
I placed order for home health with face to face as well. Do you want me to try to reorder?

## 2013-12-28 ENCOUNTER — Other Ambulatory Visit: Payer: Self-pay | Admitting: Geriatric Medicine

## 2013-12-28 DIAGNOSIS — M6281 Muscle weakness (generalized): Secondary | ICD-10-CM | POA: Diagnosis not present

## 2013-12-28 DIAGNOSIS — R2689 Other abnormalities of gait and mobility: Secondary | ICD-10-CM | POA: Diagnosis not present

## 2013-12-28 MED ORDER — DONEPEZIL HCL 10 MG PO TABS: 10.0000 mg | ORAL_TABLET | Freq: Every day | ORAL | Status: DC

## 2013-12-31 ENCOUNTER — Ambulatory Visit: Payer: Medicare Other | Admitting: Internal Medicine

## 2014-01-01 DIAGNOSIS — R2689 Other abnormalities of gait and mobility: Secondary | ICD-10-CM | POA: Diagnosis not present

## 2014-01-01 DIAGNOSIS — M6281 Muscle weakness (generalized): Secondary | ICD-10-CM | POA: Diagnosis not present

## 2014-01-03 DIAGNOSIS — R2689 Other abnormalities of gait and mobility: Secondary | ICD-10-CM | POA: Diagnosis not present

## 2014-01-03 DIAGNOSIS — M6281 Muscle weakness (generalized): Secondary | ICD-10-CM | POA: Diagnosis not present

## 2014-01-08 DIAGNOSIS — R2689 Other abnormalities of gait and mobility: Secondary | ICD-10-CM | POA: Diagnosis not present

## 2014-01-08 DIAGNOSIS — M6281 Muscle weakness (generalized): Secondary | ICD-10-CM | POA: Diagnosis not present

## 2014-01-11 DIAGNOSIS — M6281 Muscle weakness (generalized): Secondary | ICD-10-CM | POA: Diagnosis not present

## 2014-01-11 DIAGNOSIS — R2689 Other abnormalities of gait and mobility: Secondary | ICD-10-CM | POA: Diagnosis not present

## 2014-01-15 DIAGNOSIS — R2689 Other abnormalities of gait and mobility: Secondary | ICD-10-CM | POA: Diagnosis not present

## 2014-01-15 DIAGNOSIS — M6281 Muscle weakness (generalized): Secondary | ICD-10-CM | POA: Diagnosis not present

## 2014-01-16 DIAGNOSIS — F0390 Unspecified dementia without behavioral disturbance: Secondary | ICD-10-CM | POA: Diagnosis not present

## 2014-01-16 DIAGNOSIS — I5033 Acute on chronic diastolic (congestive) heart failure: Secondary | ICD-10-CM

## 2014-01-16 DIAGNOSIS — R269 Unspecified abnormalities of gait and mobility: Secondary | ICD-10-CM | POA: Diagnosis not present

## 2014-01-16 DIAGNOSIS — G309 Alzheimer's disease, unspecified: Secondary | ICD-10-CM | POA: Diagnosis not present

## 2014-01-17 DIAGNOSIS — M6281 Muscle weakness (generalized): Secondary | ICD-10-CM | POA: Diagnosis not present

## 2014-01-17 DIAGNOSIS — R2689 Other abnormalities of gait and mobility: Secondary | ICD-10-CM | POA: Diagnosis not present

## 2014-01-22 DIAGNOSIS — M6281 Muscle weakness (generalized): Secondary | ICD-10-CM | POA: Diagnosis not present

## 2014-01-22 DIAGNOSIS — R2689 Other abnormalities of gait and mobility: Secondary | ICD-10-CM | POA: Diagnosis not present

## 2014-01-23 ENCOUNTER — Telehealth: Payer: Self-pay | Admitting: Cardiology

## 2014-01-23 DIAGNOSIS — R2689 Other abnormalities of gait and mobility: Secondary | ICD-10-CM | POA: Diagnosis not present

## 2014-01-23 DIAGNOSIS — M6281 Muscle weakness (generalized): Secondary | ICD-10-CM | POA: Diagnosis not present

## 2014-01-23 NOTE — Telephone Encounter (Signed)
New message    Wife calling    Husband C/O chest pain  .took nitro last night x  2 Stating everything going dark. 84/33.     Today chest pian issues wife did not take his blood pressure

## 2014-01-23 NOTE — Telephone Encounter (Signed)
Wife calling stating he had some CP yesterday.  Took 2 NTG 5 min apart with relief.  She states he describes pain as "twinges". BP 84/33. No SOB. Describes pain across chest. Today at 3:15 had another episode of CP.  Took one NTG with relief. BP 103/44. States he seems to feel fine.  Spoke w/[name] who advises for him to monitor and if reoccurs to call 911.  Wife verbalizes understanding and will call 911 if necessary.

## 2014-01-24 DIAGNOSIS — C672 Malignant neoplasm of lateral wall of bladder: Secondary | ICD-10-CM | POA: Diagnosis not present

## 2014-01-25 DIAGNOSIS — H3531 Nonexudative age-related macular degeneration: Secondary | ICD-10-CM | POA: Diagnosis not present

## 2014-01-29 DIAGNOSIS — M6281 Muscle weakness (generalized): Secondary | ICD-10-CM | POA: Diagnosis not present

## 2014-01-29 DIAGNOSIS — R2689 Other abnormalities of gait and mobility: Secondary | ICD-10-CM | POA: Diagnosis not present

## 2014-02-01 ENCOUNTER — Encounter: Payer: Self-pay | Admitting: Cardiology

## 2014-02-01 ENCOUNTER — Ambulatory Visit (INDEPENDENT_AMBULATORY_CARE_PROVIDER_SITE_OTHER): Payer: Medicare Other | Admitting: Cardiology

## 2014-02-01 VITALS — BP 130/56 | HR 71 | Ht 75.5 in | Wt 238.1 lb

## 2014-02-01 DIAGNOSIS — I340 Nonrheumatic mitral (valve) insufficiency: Secondary | ICD-10-CM

## 2014-02-01 DIAGNOSIS — F0390 Unspecified dementia without behavioral disturbance: Secondary | ICD-10-CM | POA: Diagnosis not present

## 2014-02-01 DIAGNOSIS — D509 Iron deficiency anemia, unspecified: Secondary | ICD-10-CM

## 2014-02-01 DIAGNOSIS — I4891 Unspecified atrial fibrillation: Secondary | ICD-10-CM | POA: Diagnosis not present

## 2014-02-01 DIAGNOSIS — I482 Chronic atrial fibrillation, unspecified: Secondary | ICD-10-CM

## 2014-02-01 DIAGNOSIS — R609 Edema, unspecified: Secondary | ICD-10-CM

## 2014-02-01 DIAGNOSIS — R079 Chest pain, unspecified: Secondary | ICD-10-CM | POA: Diagnosis not present

## 2014-02-01 DIAGNOSIS — I119 Hypertensive heart disease without heart failure: Secondary | ICD-10-CM | POA: Diagnosis not present

## 2014-02-01 LAB — CBC WITH DIFFERENTIAL/PLATELET
Basophils Absolute: 0 10*3/uL (ref 0.0–0.1)
Basophils Relative: 0.3 % (ref 0.0–3.0)
EOS ABS: 0.1 10*3/uL (ref 0.0–0.7)
Eosinophils Relative: 2 % (ref 0.0–5.0)
HEMATOCRIT: 28.7 % — AB (ref 39.0–52.0)
HEMOGLOBIN: 8.9 g/dL — AB (ref 13.0–17.0)
LYMPHS ABS: 0.6 10*3/uL — AB (ref 0.7–4.0)
LYMPHS PCT: 9.8 % — AB (ref 12.0–46.0)
MCHC: 31.1 g/dL (ref 30.0–36.0)
MCV: 83.8 fl (ref 78.0–100.0)
Monocytes Absolute: 0.8 10*3/uL (ref 0.1–1.0)
Monocytes Relative: 13.3 % — ABNORMAL HIGH (ref 3.0–12.0)
NEUTROS ABS: 4.4 10*3/uL (ref 1.4–7.7)
Neutrophils Relative %: 74.6 % (ref 43.0–77.0)
Platelets: 206 10*3/uL (ref 150.0–400.0)
RBC: 3.43 Mil/uL — ABNORMAL LOW (ref 4.22–5.81)
RDW: 16.7 % — AB (ref 11.5–15.5)
WBC: 5.9 10*3/uL (ref 4.0–10.5)

## 2014-02-01 LAB — BASIC METABOLIC PANEL
BUN: 11 mg/dL (ref 6–23)
CALCIUM: 9 mg/dL (ref 8.4–10.5)
CO2: 29 mEq/L (ref 19–32)
CREATININE: 0.9 mg/dL (ref 0.4–1.5)
Chloride: 100 mEq/L (ref 96–112)
GFR: 83.15 mL/min (ref >60.00)
Glucose, Bld: 79 mg/dL (ref 70–99)
Potassium: 4.1 mEq/L (ref 3.5–5.1)
Sodium: 134 mEq/L — ABNORMAL LOW (ref 135–145)

## 2014-02-01 LAB — LIPID PANEL
CHOL/HDL RATIO: 3
Cholesterol: 127 mg/dL (ref 0–200)
HDL: 42.8 mg/dL (ref >39.00)
LDL CALC: 79 mg/dL (ref 0–99)
NonHDL: 84.2
TRIGLYCERIDES: 25 mg/dL (ref 0.0–149.0)
VLDL: 5 mg/dL (ref 0.0–40.0)

## 2014-02-01 LAB — HEPATIC FUNCTION PANEL
ALT: 12 U/L (ref 0–53)
AST: 17 U/L (ref 0–37)
Albumin: 3.2 g/dL — ABNORMAL LOW (ref 3.5–5.2)
Alkaline Phosphatase: 77 U/L (ref 39–117)
BILIRUBIN DIRECT: 0.1 mg/dL (ref 0.0–0.3)
BILIRUBIN TOTAL: 0.5 mg/dL (ref 0.2–1.2)
Total Protein: 6.5 g/dL (ref 6.0–8.3)

## 2014-02-01 MED ORDER — FUROSEMIDE 40 MG PO TABS: 60.0000 mg | ORAL_TABLET | Freq: Every morning | ORAL | Status: DC

## 2014-02-01 NOTE — Assessment & Plan Note (Signed)
The rate is controlled on low-dose metoprolol.  He has not had any embolic symptoms.

## 2014-02-01 NOTE — Assessment & Plan Note (Signed)
Both the patient and his wife have dementia.  The family has now arranged for help in the home

## 2014-02-01 NOTE — Assessment & Plan Note (Signed)
The patient is sedentary.  He has chronic bilateral peripheral edema which is still 2+.  In the hospital he was on a higher dose of Lasix and did well.  We will put him back on his previous higher dose which is 60 mg daily

## 2014-02-01 NOTE — Progress Notes (Signed)
[name] Date of Birth:  12/04/1927 [facility] [address] [place], [place]  [address] [phone]        Fax   [phone]   History of Present Illness: This pleasant 80 year old gentleman is seen for a scheduled followup office visit. He has a past history of chronic established atrial fibrillation. He is not on Coumadin because of recurrent GI bleeds. He also has problems with exogenous obesity and diabetes. . He had a cardiac catheterization on 08/07/01 showing normal coronary arteries and an ejection fraction of 60-65%. He had an echocardiogram on 04/21/11 showing an ejection fraction of 60-65%, moderate mitral regurgitation, mild aortic insufficiency, moderate tricuspid regurgitation, and pulmonary artery pressure was 46 mmHg. since we last saw him he has had  problems with peripheral edema and weight gain. His weight is down 12 pounds since last visit. Is not having any paroxysmal nocturnal dyspnea or nocturia. He states that his peripheral edema goes down significantly overnight.  He has not been taking his Lasix every day. He had a Lexiscan Myoview on 01/23/13 showing no ischemia.  The study was not gated because of his atrial fibrillation. The patient had an echocardiogram 05/30/13 showing an ejection fraction of 55-60%. The patient has worsening dementia.   Current Outpatient Prescriptions  Medication Sig Dispense Refill  . aspirin EC 325 MG EC tablet Take 1 tablet (325 mg total) by mouth daily. 30 tablet 0  . cholecalciferol (VITAMIN D) 1000 UNITS tablet Take 1,000 Units by mouth daily.    . COD LIVER OIL PO Take 1 capsule by mouth daily.     [name] donepezil (ARICEPT) 10 MG tablet Take 1 tablet (10 mg total) by mouth at bedtime. 90 tablet 3  . ferrous sulfate 325 (65 FE) MG EC tablet Take 325 mg by mouth 2 (two) times daily.     . folic acid (FOLVITE) 1 MG tablet Take 1 mg by mouth 2 (two) times daily.     . furosemide (LASIX) 40 MG tablet Take 1.5  tablets (60 mg total) by mouth every morning. 135 tablet 3  . gabapentin (NEURONTIN) 100 MG capsule Take 100 mg by mouth at bedtime.    [name] glipiZIDE (GLUCOTROL) 5 MG tablet Take 1 tablet (5 mg total) by mouth daily. 90 tablet 3  . losartan (COZAAR) 50 MG tablet Take 50 mg by mouth daily.    . Memantine HCl ER (NAMENDA XR) 28 MG CP24 Take 28 mg by mouth daily. 90 capsule 4  . metoprolol tartrate (LOPRESSOR) 25 MG tablet Take 0.5 tablets (12.5 mg total) by mouth every morning. 30 tablet 5  . Multiple Vitamin (MULTIVITAMIN) tablet Take 1 tablet by mouth every morning.     . Multiple Vitamins-Minerals (PRESERVISION AREDS 2) CAPS Take 1 capsule by mouth every morning.    . nitroGLYCERIN (NITROSTAT) 0.4 MG SL tablet Place 1 tablet (0.4 mg total) under the tongue every 5 (five) minutes as needed for chest pain. 100 tablet 0  . pantoprazole (PROTONIX) 40 MG tablet Take 1 tablet (40 mg total) by mouth daily. 90 tablet 0  . Polyethylene Glycol 3350 (GLYCOLAX PO) Take 1 packet by mouth daily as needed. For constipation    . solifenacin (VESICARE) 5 MG tablet Take 1 tablet (5 mg total) by mouth daily. 90 tablet 1  . [DISCONTINUED] oxybutynin (DITROPAN XL) 15 MG 24 hr tablet Take 1 tablet (15 mg total) by mouth daily. 90 tablet 3   No current  facility-administered medications for this visit.    Allergies  Allergen Reactions  . Cephalexin Other (See Comments)    Unknown reaction  . Furacin [Nitrofurazone] Other (See Comments)    Unknown reaction  . Macrodantin Other (See Comments)    Unknown reaction  . Zestril [Lisinopril] Other (See Comments)    Unknown reaction    Patient Active Problem List   Diagnosis Date Noted  . Atrial fibrillation 09/01/2010    Priority: High  . Diabetes mellitus 09/01/2010    Priority: High  . Dementia 09/01/2010    Priority: High  . Acute on chronic diastolic CHF (congestive heart failure) 12/18/2013  . Chest pain 12/17/2013  . Abnormality of gait 05/28/2013  .  Chronic venous insufficiency 04/04/2013  . Weight loss, non-intentional 12/20/2012  . Low back pain 11/17/2012  . Mitral regurgitation 08/26/2011  . Heart murmur 04/15/2011  . Iron deficiency anemia due to chronic blood loss 09/01/2010  . Benign hypertensive heart disease without heart failure 09/01/2010    History  Smoking status  . Former Smoker  . Quit date: 08/28/1975  Smokeless tobacco  . Former User    History  Alcohol Use  . 4.2 oz/week  . 7 Shots of liquor per week    Family History  Problem Relation Age of Onset  . Kidney disease Mother   . Heart disease Mother   . Stroke Mother   . Cancer Father     prostate  . Diabetes Neg Hx   . COPD Neg Hx   . Mental illness Brother     Review of Systems: Constitutional: no fever chills diaphoresis or fatigue or change in weight.  Head and neck: no hearing loss, no epistaxis, no photophobia or visual disturbance. Respiratory: No cough, shortness of breath or wheezing. Cardiovascular: No chest pain peripheral edema, palpitations. Gastrointestinal: No abdominal distention, no abdominal pain, no change in bowel habits hematochezia or melena. Genitourinary: No dysuria, no frequency, no urgency, no nocturia. Musculoskeletal:No arthralgias, no back pain, no gait disturbance or myalgias. Neurological: No dizziness, no headaches, no numbness, no seizures, no syncope, no weakness, no tremors. Hematologic: No lymphadenopathy, no easy bruising. Psychiatric: No confusion, no hallucinations, no sleep disturbance.    Physical Exam: Filed Vitals:   02/01/14 1503  BP: 130/56  Pulse: 71   the general appearance reveals a pleasant large gentleman in no distress.The head and neck exam reveals pupils equal and reactive.  Extraocular movements are full.  There is no scleral icterus.  The mouth and pharynx are normal.  The neck is supple.  The carotids reveal no bruits.  The jugular venous pressure is normal.  The  thyroid is not  enlarged.  There is no lymphadenopathy.  The chest is clear to percussion and auscultation.  There are no rales or rhonchi.  Expansion of the chest is symmetrical.  The precordium is quiet.  The pulse is irregular The first heart sound is normal.  The second heart sound is physiologically split.  There is soft apical systolic murmur  There is no abnormal lift or heave.  The abdomen is soft and nontender.  The bowel sounds are normal.  The liver and spleen are not enlarged.  There are no abdominal masses.  There are no abdominal bruits.  Extremities reveal good pedal pulses.  There is mild peripheral edema  There is no cyanosis or clubbing.  Strength is normal and symmetrical in all extremities.  There is no lateralizing weakness.  There are no sensory deficits.  The skin is warm and dry.  There is no rash.     Assessment / Plan: 1. persistent permanent atrial fibrillation 2. diabetes mellitus 3. chronic diastolic heart failure 4. severe dementia 5. osteoarthritis  Continue same medications except increase his Lasix back to 60 mg daily.  Recheck in 4 months for office visit and lab work.we are checking lab work today including CBC lipid panel hepatic function panel and basal metabolic panel

## 2014-02-01 NOTE — Patient Instructions (Signed)
Will obtain labs today and call you with the results (LP/BMET/HFP/CBC)  INCREASE YOUR LASIX (FUROSEMIDE) TO 40 MG 1 AND 1/2 DAILY   Your physician recommends that you schedule a follow-up appointment in: [place]

## 2014-02-02 NOTE — Progress Notes (Signed)
Quick Note:  Please report to patient. The recent labs are stable. Continue same medication and careful diet. More anemic this time so be sure to continue taking his iron. ______

## 2014-02-08 DIAGNOSIS — M6281 Muscle weakness (generalized): Secondary | ICD-10-CM | POA: Diagnosis not present

## 2014-02-08 DIAGNOSIS — R2689 Other abnormalities of gait and mobility: Secondary | ICD-10-CM | POA: Diagnosis not present

## 2014-02-11 ENCOUNTER — Other Ambulatory Visit: Payer: Self-pay

## 2014-02-11 DIAGNOSIS — L218 Other seborrheic dermatitis: Secondary | ICD-10-CM | POA: Diagnosis not present

## 2014-02-11 DIAGNOSIS — D692 Other nonthrombocytopenic purpura: Secondary | ICD-10-CM | POA: Diagnosis not present

## 2014-02-11 MED ORDER — NITROGLYCERIN 0.4 MG SL SUBL: 0.4000 mg | SUBLINGUAL_TABLET | SUBLINGUAL | Status: DC | PRN

## 2014-02-27 ENCOUNTER — Telehealth: Payer: Self-pay | Admitting: Cardiology

## 2014-02-27 NOTE — Telephone Encounter (Signed)
PT HAS A COUGH AND WIFE STATES HE ALWAYS GETS IT IN HIS CHEST, WANTS TO MAKE AN APPT TO SEE DR BRACKBILL, i TOLD HER SHE NEEDED TO CHECK WITH HIS PCP, SHE SAID THEY CAN'T SEE HIM SOON ENOUGH, WANTS TO TALK TO [place]

## 2014-02-27 NOTE — Telephone Encounter (Signed)
Discussed with  [name] and recommended plain Mucinex and keep appointment tomorrow Advised wife, verbalized understanding

## 2014-02-28 ENCOUNTER — Encounter: Payer: Self-pay | Admitting: Family

## 2014-02-28 ENCOUNTER — Ambulatory Visit (INDEPENDENT_AMBULATORY_CARE_PROVIDER_SITE_OTHER): Payer: Medicare Other | Admitting: Family

## 2014-02-28 ENCOUNTER — Ambulatory Visit (INDEPENDENT_AMBULATORY_CARE_PROVIDER_SITE_OTHER)
Admission: RE | Admit: 2014-02-28 | Discharge: 2014-02-28 | Disposition: A | Discharge status: Home / Self Care | Payer: Medicare Other | Source: Ambulatory Visit | Attending: Family | Admitting: Family

## 2014-02-28 VITALS — BP 140/68 | HR 82 | Temp 97.9°F | Resp 18 | Ht 75.0 in | Wt 249.4 lb

## 2014-02-28 DIAGNOSIS — R05 Cough: Secondary | ICD-10-CM

## 2014-02-28 DIAGNOSIS — R059 Cough, unspecified: Secondary | ICD-10-CM | POA: Insufficient documentation

## 2014-02-28 DIAGNOSIS — I517 Cardiomegaly: Secondary | ICD-10-CM | POA: Diagnosis not present

## 2014-02-28 DIAGNOSIS — E119 Type 2 diabetes mellitus without complications: Secondary | ICD-10-CM | POA: Diagnosis not present

## 2014-02-28 MED ORDER — AZITHROMYCIN 250 MG PO TABS: ORAL_TABLET | ORAL | Status: DC

## 2014-02-28 NOTE — Progress Notes (Signed)
Subjective:    Patient ID: [name], male    DOB: 1927-09-10, 80 y.o.   MRN: [id number]  Chief Complaint  Patient presents with  . Cough    productive cough and congestion x3 days    HPI:  [name] is a 80 y.o. male who presents today for an acute visit.  Acute symptoms of productive cough and congestion started approximately 3 days ago. Denies any fevers or chills, sinus pressure or shortness of breath. Has tried Mucinex and Robtussin cough syrup which seem to provide some relief. Indicates that he is slowly getting worse.  Allergies  Allergen Reactions  . Cephalexin Other (See Comments)    Unknown reaction  . Furacin [Nitrofurazone] Other (See Comments)    Unknown reaction  . Macrodantin Other (See Comments)    Unknown reaction  . Zestril [Lisinopril] Other (See Comments)    Unknown reaction   Current Outpatient Prescriptions on File Prior to Visit  Medication Sig Dispense Refill  . aspirin EC 325 MG EC tablet Take 1 tablet (325 mg total) by mouth daily. 30 tablet 0  . cholecalciferol (VITAMIN D) 1000 UNITS tablet Take 1,000 Units by mouth daily.    . COD LIVER OIL PO Take 1 capsule by mouth daily.     [name] donepezil (ARICEPT) 10 MG tablet Take 1 tablet (10 mg total) by mouth at bedtime. 90 tablet 3  . ferrous sulfate 325 (65 FE) MG EC tablet Take 325 mg by mouth 2 (two) times daily.     . folic acid (FOLVITE) 1 MG tablet Take 1 mg by mouth 2 (two) times daily.     . furosemide (LASIX) 40 MG tablet Take 1.5 tablets (60 mg total) by mouth every morning. 135 tablet 3  . gabapentin (NEURONTIN) 100 MG capsule Take 100 mg by mouth at bedtime.    [name] glipiZIDE (GLUCOTROL) 5 MG tablet Take 1 tablet (5 mg total) by mouth daily. 90 tablet 3  . losartan (COZAAR) 50 MG tablet Take 50 mg by mouth daily.    . Memantine HCl ER (NAMENDA XR) 28 MG CP24 Take 28 mg by mouth daily. 90 capsule 4  . metoprolol tartrate (LOPRESSOR) 25 MG tablet Take 0.5 tablets (12.5 mg total) by mouth  every morning. 30 tablet 5  . Multiple Vitamin (MULTIVITAMIN) tablet Take 1 tablet by mouth every morning.     . Multiple Vitamins-Minerals (PRESERVISION AREDS 2) CAPS Take 1 capsule by mouth every morning.    . nitroGLYCERIN (NITROSTAT) 0.4 MG SL tablet Place 1 tablet (0.4 mg total) under the tongue every 5 (five) minutes as needed for chest pain. 100 tablet 0  . pantoprazole (PROTONIX) 40 MG tablet Take 1 tablet (40 mg total) by mouth daily. 90 tablet 0  . Polyethylene Glycol 3350 (GLYCOLAX PO) Take 1 packet by mouth daily as needed. For constipation    . solifenacin (VESICARE) 5 MG tablet Take 1 tablet (5 mg total) by mouth daily. 90 tablet 1  . [DISCONTINUED] oxybutynin (DITROPAN XL) 15 MG 24 hr tablet Take 1 tablet (15 mg total) by mouth daily. 90 tablet 3   No current facility-administered medications on file prior to visit.  Review of Systems    See HPI   Objective:    BP 140/68 mmHg  Pulse 82  Temp(Src) 97.9 F (36.6 C) (Oral)  Resp 18  Ht 6\' 3"  (1.905 m)  Wt 249 lb 6.4 oz (113.127 kg)  BMI 31.17 kg/m2  SpO2 97% Nursing note and vital signs reviewed.  Physical Exam  Constitutional: He is oriented to person, place, and time. He appears well-developed and well-nourished. No distress.  HENT:  Right Ear: Hearing, tympanic membrane, external ear and ear canal normal.  Left Ear: Hearing, tympanic membrane, external ear and ear canal normal.  Nose: Nose normal. Right sinus exhibits no maxillary sinus tenderness and no frontal sinus tenderness. Left sinus exhibits no maxillary sinus tenderness and no frontal sinus tenderness.  Mouth/Throat: Uvula is midline, oropharynx is clear and moist and mucous membranes are normal.  Cardiovascular: Normal rate, regular rhythm, normal heart sounds and intact distal pulses.   Pulmonary/Chest: Effort normal. He has rales.  Neurological: He is alert and  oriented to person, place, and time.  Skin: Skin is warm and dry.  Psychiatric: He has a normal mood and affect. His behavior is normal. Judgment and thought content normal.       Assessment & Plan:

## 2014-02-28 NOTE — Patient Instructions (Signed)
Thank you for choosing [facility].  Summary/Instructions:  Your prescription(s) have been submitted to your pharmacy. Please take as directed and contact our office if you believe you are having problem(s) with the medication(s).  Please stop by radiology on the basement level of the building for your x-rays. Your results will be released to MyChart (or called to you) after review, usually within 72hours after test completion. If any changes need to be made, you will be notified at that same time.  If your symptoms worsen or fail to improve, please contact our office for further instruction, or in case of emergency go directly to the emergency room at the closest medical facility.     

## 2014-02-28 NOTE — Assessment & Plan Note (Addendum)
Given crackling in lungs, cannot rule out pneumonia. Obtain chest x-ray. Start azithromycin. Continue over-the-counter medication as needed for symptom relief. Follow up if symptoms worsen or fail to improve.

## 2014-02-28 NOTE — Progress Notes (Signed)
Pre visit review using our clinic review tool, if applicable. No additional management support is needed unless otherwise documented below in the visit note. 

## 2014-03-11 ENCOUNTER — Other Ambulatory Visit: Payer: Self-pay | Admitting: Cardiology

## 2014-04-10 ENCOUNTER — Other Ambulatory Visit: Payer: Self-pay | Admitting: Cardiology

## 2014-05-06 ENCOUNTER — Other Ambulatory Visit: Payer: Self-pay | Admitting: *Deleted

## 2014-05-06 MED ORDER — LOSARTAN POTASSIUM 50 MG PO TABS: 50.0000 mg | ORAL_TABLET | Freq: Every day | ORAL | Status: DC

## 2014-05-23 ENCOUNTER — Ambulatory Visit (INDEPENDENT_AMBULATORY_CARE_PROVIDER_SITE_OTHER): Payer: Medicare Other | Admitting: Internal Medicine

## 2014-05-23 ENCOUNTER — Telehealth: Payer: Self-pay | Admitting: Internal Medicine

## 2014-05-23 ENCOUNTER — Telehealth: Payer: Self-pay | Admitting: Cardiology

## 2014-05-23 ENCOUNTER — Encounter: Payer: Self-pay | Admitting: Internal Medicine

## 2014-05-23 VITALS — BP 116/58 | HR 99 | Temp 97.8°F | Resp 16 | Ht 75.0 in | Wt 241.0 lb

## 2014-05-23 DIAGNOSIS — F0390 Unspecified dementia without behavioral disturbance: Secondary | ICD-10-CM

## 2014-05-23 DIAGNOSIS — E119 Type 2 diabetes mellitus without complications: Secondary | ICD-10-CM

## 2014-05-23 DIAGNOSIS — R609 Edema, unspecified: Secondary | ICD-10-CM | POA: Diagnosis not present

## 2014-05-23 MED ORDER — HYDROCORTISONE 1 % EX OINT: 1.0000 "application " | TOPICAL_OINTMENT | Freq: Two times a day (BID) | CUTANEOUS | Status: DC

## 2014-05-23 MED ORDER — FUROSEMIDE 40 MG PO TABS: 40.0000 mg | ORAL_TABLET | Freq: Every morning | ORAL | Status: DC

## 2014-05-23 MED ORDER — HYDROXYZINE HCL 10 MG PO TABS: 10.0000 mg | ORAL_TABLET | Freq: Two times a day (BID) | ORAL | Status: DC | PRN

## 2014-05-23 NOTE — Telephone Encounter (Signed)
Advised patient

## 2014-05-23 NOTE — Telephone Encounter (Signed)
Pt's spouse request phone call from the assistant about paper that Dr. Doug Sou gave him about Alzheimer. They are not aware of this diagnose.

## 2014-05-23 NOTE — Patient Instructions (Addendum)
I think that we should stop some medicines that could be contributing to his itching that may not be helping him much anymore. We will add a medicine and a cream which you can use for the itching. If the medicine does not work (and he is still picking) then I would like you to stop using it. It is called hydroxyzine and he can take it up to 2 times per day. It may make him sleepy or drowsy. The cream has been sent to the pharmacy and can be used up to 2 times per day.   If he continues to pick and scratch then likely these spots will not heal well. They do not look to be infected today and no antibiotics are needed. You can clean these spots with soap and water daily or when bathing.   Likely the alzheimer's dementia is causing his brain to wander and to cause him to want to pick at things and places that might not even be itching. There is not much that we can do about that.   We will stop these medicines to see if he does better: 1. Glipizide 2. Cod oil 3. Lasix 4. Folic Acid 5. Vitamin D 6. Protonix 7. Iron pills  Keep taking: 1. Aricept 2. Namenda 3. Losartan (cozaar) 4. Metoprolol (1/2 pill daily) 5. Vesicare 6. Aspirin 7. Gabapentin 8. Miralax (polyethylene glycol) as needed for constipation 9. Nitroglycerin (as needed for chest pain)

## 2014-05-23 NOTE — Telephone Encounter (Signed)
New message      PCP want pt to stop several of his medications.  They want to make sure it is ok with Dr Mare Ferrari

## 2014-05-23 NOTE — Progress Notes (Signed)
   Subjective:    Patient ID: [name], male    DOB: 10/12/1927, 81 y.o.   MRN: [id number]  HPI The patient is an 81 YO man coming in for spots he is scratching. He does have complicating history of advanced dementia. He is with his caretaker today. She states that he picks and scratches most of the day and night. No rash on his body except where he is scratching. Denies fevers, chills, weight loss. No recent medication or soap changes. Has been going on for about 3-4 months. They saw a dermatologist who gave them a cream which did not help much.   Review of Systems  Constitutional: Negative for fever, activity change, appetite change and fatigue.  Respiratory: Negative for cough, chest tightness, shortness of breath and wheezing.   Cardiovascular: Positive for leg swelling. Negative for chest pain and palpitations.  Gastrointestinal: Negative for abdominal pain, diarrhea, constipation and abdominal distention.  Musculoskeletal: Positive for gait problem. Negative for myalgias and arthralgias.  Skin: Positive for wound.  Neurological: Positive for weakness. Negative for dizziness, light-headedness and headaches.      Objective:   Physical Exam  Constitutional: He appears well-developed and well-nourished. No distress.  HENT:  Head: Normocephalic and atraumatic.  Eyes: EOM are normal.  Neck: Normal range of motion.  Cardiovascular: Normal rate and regular rhythm.   Pulmonary/Chest: Effort normal and breath sounds normal. No respiratory distress. He has no wheezes. He has no rales.  Abdominal: Soft. Bowel sounds are normal.  Musculoskeletal: He exhibits edema.  1-2+ edema in the legs bilaterally and hardened skin around the ankles bilaterally with some color change suggesting chronic congestion.  Neurological: He is alert. Coordination abnormal.  Skin: Skin is warm and dry.  Lesions on the upper arms and upper back with stigmata of scratching. No rash around the scratches and none  appear infected.    Filed Vitals:   05/23/14 1312  BP: 116/58  Pulse: 99  Temp: 97.8 F (36.6 C)  TempSrc: Oral  Resp: 16  Height: 6\' 3"  (1.905 m)  Weight: 241 lb (109.317 kg)  SpO2: 97%      Assessment & Plan:  Visit time 25 minutes, greater than 50% of which was spent in face to face.

## 2014-05-23 NOTE — Assessment & Plan Note (Addendum)
Patient and his wife have some dementia, his is more severe. He is scratching and picking. Doubt etiology dermatologic. Plan more palliative approach and will D/C medications that are not essential including: losartan, glipizide, cod oil, vitamin d, multivitamin, folic acid, iron pills. Will continue his metoprolol, lasix, vesicare, namenda, aricept. Can try prn hydroxyzine bid prn to see if he is having true itching that can be stopped. Advised to caretaker to trim his nails and to have him sleep in gloves to prevent night time scratching.

## 2014-05-23 NOTE — Progress Notes (Signed)
Pre visit review using our clinic review tool, if applicable. No additional management support is needed unless otherwise documented below in the visit note. 

## 2014-05-23 NOTE — Assessment & Plan Note (Signed)
Last HgA1c 5.6 and will stop his glipizide. Goal <8 and doubt his HgA1c would rise that much.

## 2014-05-23 NOTE — Telephone Encounter (Signed)
I agree with those changes that PCP made.

## 2014-05-23 NOTE — Telephone Encounter (Signed)
Advised wife not patient

## 2014-05-23 NOTE — Telephone Encounter (Signed)
PCP d/c Glipizide, Cod liver oil, Folic Acid, Vitamin D, Protonix, Iron, Losartan, and decreased Lasix to one daily Wife wanted for  [name] to be aware and see if he was agreeable to this Will forward to  [name] for review

## 2014-05-28 ENCOUNTER — Other Ambulatory Visit: Payer: Self-pay | Admitting: Internal Medicine

## 2014-05-28 DIAGNOSIS — G309 Alzheimer's disease, unspecified: Principal | ICD-10-CM

## 2014-05-28 DIAGNOSIS — F0280 Dementia in other diseases classified elsewhere without behavioral disturbance: Secondary | ICD-10-CM

## 2014-05-28 NOTE — Telephone Encounter (Signed)
Spoke with patient and she would like an order for a Education officer, museum to come out to the house to access her husband for his Alzheimer's and dementia.

## 2014-05-28 NOTE — Telephone Encounter (Signed)
Pt called back in and would like call back about previous note

## 2014-05-30 NOTE — Telephone Encounter (Signed)
Patient aware that a Education officer, museum has been referred.

## 2014-05-30 NOTE — Telephone Encounter (Signed)
Patient is requesting a call back in regards to what she needs to do with her husband's alzheimer.  Patient would like to speak with Dr. Doug Sou.

## 2014-05-30 NOTE — Telephone Encounter (Signed)
We have a Education officer, museum ordered to come out to the house to evaluate. They would need to come in if things have changed with his dementia. She also has memory problems and a phone call would not likely solve this.

## 2014-06-04 ENCOUNTER — Encounter: Payer: Self-pay | Admitting: Cardiology

## 2014-06-04 ENCOUNTER — Ambulatory Visit (INDEPENDENT_AMBULATORY_CARE_PROVIDER_SITE_OTHER): Payer: Medicare Other | Admitting: Cardiology

## 2014-06-04 VITALS — BP 108/50 | HR 77 | Ht 75.0 in | Wt 240.1 lb

## 2014-06-04 DIAGNOSIS — I119 Hypertensive heart disease without heart failure: Secondary | ICD-10-CM | POA: Diagnosis not present

## 2014-06-04 DIAGNOSIS — I482 Chronic atrial fibrillation, unspecified: Secondary | ICD-10-CM

## 2014-06-04 DIAGNOSIS — F0390 Unspecified dementia without behavioral disturbance: Secondary | ICD-10-CM | POA: Diagnosis not present

## 2014-06-04 LAB — BASIC METABOLIC PANEL
BUN: 10 mg/dL (ref 6–23)
CO2: 33 mEq/L — ABNORMAL HIGH (ref 19–32)
CREATININE: 0.98 mg/dL (ref 0.40–1.50)
Calcium: 9.1 mg/dL (ref 8.4–10.5)
Chloride: 100 mEq/L (ref 96–112)
GFR: 77.24 mL/min (ref >60.00)
Glucose, Bld: 85 mg/dL (ref 70–99)
Potassium: 4.2 mEq/L (ref 3.5–5.1)
SODIUM: 134 meq/L — AB (ref 135–145)

## 2014-06-04 LAB — LIPID PANEL
CHOL/HDL RATIO: 2
Cholesterol: 101 mg/dL (ref 0–200)
HDL: 43.1 mg/dL (ref >39.00)
LDL Cholesterol: 49 mg/dL (ref 0–99)
NonHDL: 57.9
Triglycerides: 45 mg/dL (ref 0.0–149.0)
VLDL: 9 mg/dL (ref 0.0–40.0)

## 2014-06-04 LAB — HEPATIC FUNCTION PANEL
ALBUMIN: 3.8 g/dL (ref 3.5–5.2)
ALT: 10 U/L (ref 0–53)
AST: 15 U/L (ref 0–37)
Alkaline Phosphatase: 70 U/L (ref 39–117)
Bilirubin, Direct: 0.1 mg/dL (ref 0.0–0.3)
TOTAL PROTEIN: 6.2 g/dL (ref 6.0–8.3)
Total Bilirubin: 0.3 mg/dL (ref 0.2–1.2)

## 2014-06-04 LAB — CBC WITH DIFFERENTIAL/PLATELET
BASOS ABS: 0 10*3/uL (ref 0.0–0.1)
Basophils Relative: 0.8 % (ref 0.0–3.0)
EOS ABS: 0.1 10*3/uL (ref 0.0–0.7)
Eosinophils Relative: 3.1 % (ref 0.0–5.0)
HEMATOCRIT: 26.7 % — AB (ref 39.0–52.0)
Hemoglobin: 8.5 g/dL — ABNORMAL LOW (ref 13.0–17.0)
LYMPHS ABS: 0.6 10*3/uL — AB (ref 0.7–4.0)
LYMPHS PCT: 16.2 % (ref 12.0–46.0)
MCHC: 31.9 g/dL (ref 30.0–36.0)
MCV: 77 fl — ABNORMAL LOW (ref 78.0–100.0)
Monocytes Absolute: 0.5 10*3/uL (ref 0.1–1.0)
Monocytes Relative: 15.2 % — ABNORMAL HIGH (ref 3.0–12.0)
NEUTROS ABS: 2.2 10*3/uL (ref 1.4–7.7)
Neutrophils Relative %: 64.7 % (ref 43.0–77.0)
Platelets: 171 10*3/uL (ref 150.0–400.0)
RBC: 3.46 Mil/uL — ABNORMAL LOW (ref 4.22–5.81)
RDW: 16.9 % — AB (ref 11.5–15.5)
WBC: 3.4 10*3/uL — ABNORMAL LOW (ref 4.0–10.5)

## 2014-06-04 NOTE — Patient Instructions (Signed)
Will obtain labs today and call you with the results (lp/bmet/hfp/cbc)  Your physician recommends that you continue on your current medications as directed. Please refer to the Current Medication list given to you today.  Your physician wants you to follow-up in: 4 months with fasting labs (lp/bmet/hfp/cbc) You will receive a reminder letter in the mail two months in advance. If you don't receive a letter, please call our office to schedule the follow-up appointment.

## 2014-06-04 NOTE — Progress Notes (Signed)
Cardiology Office Note   Date:  06/04/2014   ID:  [name], DOB 01-06-1928, MRN [id number]  PCP:  Olga Millers, MD  Cardiologist:   Darlin Coco, MD   No chief complaint on file.     History of Present Illness: [name] is a 81 y.o. male who presents for a four-month follow-up office visit.  This pleasant 81 year old gentleman is seen for a scheduled followup office visit. He has a past history of chronic established atrial fibrillation. He is not on Coumadin because of recurrent GI bleeds. He also has problems with exogenous obesity and diabetes. . He had a cardiac catheterization on 08/07/01 showing normal coronary arteries and an ejection fraction of 60-65%. He had an echocardiogram on 04/21/11 showing an ejection fraction of 60-65%, moderate mitral regurgitation, mild aortic insufficiency, moderate tricuspid regurgitation, and pulmonary artery pressure was 46 mmHg. since we last saw him he has had problems with peripheral edema and weight gain. His weight is down 9 pounds since last visit. Is not having any paroxysmal nocturnal dyspnea or nocturia. He states that his peripheral edema goes down significantly overnight. He has not been taking his Lasix every day. He had a Lexiscan Myoview on 01/23/13 showing no ischemia. The study was not gated because of his atrial fibrillation. The patient had an echocardiogram 05/30/13 showing an ejection fraction of 55-60%. The patient has worsening dementia.  His wife and family are starting to consider a moved to a retirement community for both him and his wife. His last visit the patient denies any chest pain or shortness of breath dizziness or syncope.  His appetite is somewhat diminished according to his wife.  The patient denies any awareness of hematochezia or melena.  Past Medical History  Diagnosis Date  . Arrhythmia     a fib  . ED (erectile dysfunction)   . Memory disorder   . Anemia     iron deficiency secondary  to bleeding  . Hematochezia     has intermittent problem  . Arthritis     DJD knee - left  . Restless leg     treats with heat  . Cancer     bladder cancer - BCG [name]  . Diabetes mellitus     NIDDM on oral medication  . Genital warts     recurrent. Not a problem  . GERD (gastroesophageal reflux disease)     treated with PPI  . Hypertension     mild  . Neuropathy     burning discomfort feet/legs  . Irritable bladder     Past Surgical History  Procedure Laterality Date  . Cardiovascular stress test  03/28/2003    EF 55%.  Normal cardiolite study  . Appendectomy  1946  . Tonsillectomy  1938  . Joint replacement  1990's    left TKR  . Knee arthroscopy  1980's    right knee     Current Outpatient Prescriptions  Medication Sig Dispense Refill  . aspirin EC 325 MG EC tablet Take 1 tablet (325 mg total) by mouth daily. 30 tablet 0  . cholecalciferol (VITAMIN D) 1000 UNITS tablet Take 1,000 Units by mouth daily.    . COD LIVER OIL PO Take by mouth daily.    [name] donepezil (ARICEPT) 10 MG tablet Take 1 tablet (10 mg total) by mouth at bedtime. 90 tablet 3  . ferrous sulfate 325 (65 FE) MG tablet Take 325 mg by mouth daily with  breakfast.    . folic acid (FOLVITE) 1 MG tablet Take 1 mg by mouth daily.    . furosemide (LASIX) 40 MG tablet Take 1 tablet (40 mg total) by mouth every morning. 135 tablet 3  . gabapentin (NEURONTIN) 100 MG capsule Take 100 mg by mouth at bedtime.    [name] glipiZIDE (GLUCOTROL) 5 MG tablet Take 5 mg by mouth daily.  2  . hydrocortisone 1 % ointment Apply 1 application topically 2 (two) times daily. 30 g 0  . hydrOXYzine (ATARAX/VISTARIL) 10 MG tablet Take 1 tablet (10 mg total) by mouth 2 (two) times daily as needed. 45 tablet 0  . losartan (COZAAR) 50 MG tablet Take 50 mg by mouth daily.  2  . Memantine HCl ER (NAMENDA XR) 28 MG CP24 Take 28 mg by mouth daily. 90 capsule 4  . metoprolol tartrate (LOPRESSOR) 25 MG tablet TAKE 1/2 BY MOUTH EVERY  MORNING (GENERIC FOR LOPRESSOR) 45 tablet 1  . nitroGLYCERIN (NITROSTAT) 0.4 MG SL tablet Place 1 tablet (0.4 mg total) under the tongue every 5 (five) minutes as needed for chest pain. 100 tablet 0  . pantoprazole (PROTONIX) 40 MG tablet Take 40 mg by mouth daily.  0  . Polyethylene Glycol 3350 (GLYCOLAX PO) Take 1 packet by mouth daily as needed. For constipation    . solifenacin (VESICARE) 5 MG tablet Take 1 tablet (5 mg total) by mouth daily. 90 tablet 1   No current facility-administered medications for this visit.    Allergies:   Cephalexin; Furacin; Macrodantin; and Zestril    Social History:  The patient  reports that he quit smoking about 38 years ago. He has quit using smokeless tobacco. He reports that he drinks about 4.2 oz of alcohol per week. He reports that he does not use illicit drugs.   Family History:  The patient's family history includes Cancer in his father; Heart disease in his mother; Kidney disease in his mother; Mental illness in his brother; Stroke in his mother. There is no history of Diabetes or COPD.    ROS:  Please see the history of present illness.   Otherwise, review of systems are positive for none.   All other systems are reviewed and negative.    PHYSICAL EXAM: VS:  BP 108/50 mmHg  Pulse 77  Ht 6\' 3"  (1.905 m)  Wt 240 lb 1.9 oz (108.918 kg)  BMI 30.01 kg/m2  SpO2 97% , BMI Body mass index is 30.01 kg/(m^2). GEN: Well nourished, well developed, in no acute distress HEENT: normal Neck: no JVD, carotid bruits, or masses Cardiac: Irregular rhythm.  Grade 1/6 systolic murmur of mitral regurgitation.  There is 1+ pretibial and ankle edema. Respiratory:  clear to auscultation bilaterally, normal work of breathing GI: soft, nontender, nondistended, + BS MS: no deformity or atrophy Skin: warm and dry, no rash Neuro:  Strength and sensation are intact Psych: euthymic mood, full affect.  There is decreased memory but the patient is very socially  pleasant   EKG:  EKG is not ordered today.    Recent Labs: 06/05/2013: TSH 3.710 12/17/2013: Pro B Natriuretic peptide (BNP) 573.8* 02/01/2014: ALT 12; BUN 11; Creatinine 0.9; Hemoglobin 8.9*; Platelets 206.0; Potassium 4.1; Sodium 134*    Lipid Panel    Component Value Date/Time   CHOL 127 02/01/2014 1553   TRIG 25.0 02/01/2014 1553   HDL 42.80 02/01/2014 1553   CHOLHDL 3 02/01/2014 1553   VLDL 5.0 02/01/2014 1553   LDLCALC 79  02/01/2014 1553      Wt Readings from Last 3 Encounters:  06/04/14 240 lb 1.9 oz (108.918 kg)  05/23/14 241 lb (109.317 kg)  02/28/14 249 lb 6.4 oz (113.127 kg)        ASSESSMENT AND PLAN:  1. permanent atrial fibrillation 2. diabetes mellitus 3. chronic diastolic heart failure 4. severe dementia 5. osteoarthritis   Current medicines are reviewed at length with the patient today.  The patient does not have concerns regarding medicines.  The following changes have been made:  no change  Labs/ tests ordered today include: CBC, lipid panel, hepatic function panel, and basal metabolic panel   Orders Placed This Encounter  Procedures  . Lipid panel  . Basic metabolic panel  . CBC with Differential/Platelet  . Hepatic function panel  . Lipid panel  . Hepatic function panel  . Basic metabolic panel  . CBC with Differential/Platelet     All open 4 months for office visit CBC lipid panel hepatic function panel and basal metabolic panel.  Continue current medication  Signed, Darlin Coco, MD  06/04/2014 [facility] [place], [place], [place]  [address] Phone: [phone]; Fax: [phone]

## 2014-06-06 DIAGNOSIS — G309 Alzheimer's disease, unspecified: Secondary | ICD-10-CM | POA: Diagnosis not present

## 2014-06-06 DIAGNOSIS — I5033 Acute on chronic diastolic (congestive) heart failure: Secondary | ICD-10-CM | POA: Diagnosis not present

## 2014-06-06 DIAGNOSIS — I4891 Unspecified atrial fibrillation: Secondary | ICD-10-CM | POA: Diagnosis not present

## 2014-06-06 DIAGNOSIS — F0390 Unspecified dementia without behavioral disturbance: Secondary | ICD-10-CM | POA: Diagnosis not present

## 2014-06-06 DIAGNOSIS — E119 Type 2 diabetes mellitus without complications: Secondary | ICD-10-CM | POA: Diagnosis not present

## 2014-06-06 DIAGNOSIS — S41101D Unspecified open wound of right upper arm, subsequent encounter: Secondary | ICD-10-CM | POA: Diagnosis not present

## 2014-06-06 DIAGNOSIS — I340 Nonrheumatic mitral (valve) insufficiency: Secondary | ICD-10-CM | POA: Diagnosis not present

## 2014-06-06 DIAGNOSIS — I872 Venous insufficiency (chronic) (peripheral): Secondary | ICD-10-CM | POA: Diagnosis not present

## 2014-06-07 ENCOUNTER — Telehealth: Payer: Self-pay | Admitting: *Deleted

## 2014-06-07 ENCOUNTER — Telehealth: Payer: Self-pay | Admitting: Cardiology

## 2014-06-07 DIAGNOSIS — I5033 Acute on chronic diastolic (congestive) heart failure: Secondary | ICD-10-CM | POA: Diagnosis not present

## 2014-06-07 DIAGNOSIS — E119 Type 2 diabetes mellitus without complications: Secondary | ICD-10-CM | POA: Diagnosis not present

## 2014-06-07 DIAGNOSIS — S41101D Unspecified open wound of right upper arm, subsequent encounter: Secondary | ICD-10-CM | POA: Diagnosis not present

## 2014-06-07 DIAGNOSIS — G309 Alzheimer's disease, unspecified: Secondary | ICD-10-CM | POA: Diagnosis not present

## 2014-06-07 DIAGNOSIS — I340 Nonrheumatic mitral (valve) insufficiency: Secondary | ICD-10-CM | POA: Diagnosis not present

## 2014-06-07 DIAGNOSIS — F0390 Unspecified dementia without behavioral disturbance: Secondary | ICD-10-CM | POA: Diagnosis not present

## 2014-06-07 NOTE — Telephone Encounter (Signed)
F/U     Pt son calling, requests a call back.     No details given.

## 2014-06-07 NOTE — Telephone Encounter (Signed)
Discussed iron with  [name] and will have patient increase Iron to 3 times a day Advised wife

## 2014-06-07 NOTE — Telephone Encounter (Signed)
Changed ASA to 81 mg daily per recent lab instructions

## 2014-06-07 NOTE — Telephone Encounter (Signed)
New message      Wife looked again and pt is already taking 2 iron pills daily

## 2014-06-09 ENCOUNTER — Other Ambulatory Visit: Payer: Self-pay | Admitting: Internal Medicine

## 2014-06-10 ENCOUNTER — Telehealth: Payer: Self-pay | Admitting: Internal Medicine

## 2014-06-10 DIAGNOSIS — F0390 Unspecified dementia without behavioral disturbance: Secondary | ICD-10-CM | POA: Diagnosis not present

## 2014-06-10 DIAGNOSIS — S41101D Unspecified open wound of right upper arm, subsequent encounter: Secondary | ICD-10-CM | POA: Diagnosis not present

## 2014-06-10 DIAGNOSIS — E119 Type 2 diabetes mellitus without complications: Secondary | ICD-10-CM | POA: Diagnosis not present

## 2014-06-10 DIAGNOSIS — I5033 Acute on chronic diastolic (congestive) heart failure: Secondary | ICD-10-CM | POA: Diagnosis not present

## 2014-06-10 DIAGNOSIS — G309 Alzheimer's disease, unspecified: Secondary | ICD-10-CM | POA: Diagnosis not present

## 2014-06-10 DIAGNOSIS — I340 Nonrheumatic mitral (valve) insufficiency: Secondary | ICD-10-CM | POA: Diagnosis not present

## 2014-06-10 NOTE — Telephone Encounter (Signed)
Left message for patient's son to call me back.

## 2014-06-10 NOTE — Telephone Encounter (Signed)
Had questions in regards to patients last visit with meds that were discontinued.  States My Chart is not updated and he sorts out the meds for his dad.

## 2014-06-10 NOTE — Telephone Encounter (Signed)
Patient son is returing your call

## 2014-06-10 NOTE — Telephone Encounter (Signed)
Spoke with patient's son and clarified medication questions.

## 2014-06-12 ENCOUNTER — Telehealth: Payer: Self-pay | Admitting: Internal Medicine

## 2014-06-12 DIAGNOSIS — F0390 Unspecified dementia without behavioral disturbance: Secondary | ICD-10-CM | POA: Diagnosis not present

## 2014-06-12 DIAGNOSIS — E119 Type 2 diabetes mellitus without complications: Secondary | ICD-10-CM | POA: Diagnosis not present

## 2014-06-12 DIAGNOSIS — S41101D Unspecified open wound of right upper arm, subsequent encounter: Secondary | ICD-10-CM | POA: Diagnosis not present

## 2014-06-12 DIAGNOSIS — G309 Alzheimer's disease, unspecified: Secondary | ICD-10-CM | POA: Diagnosis not present

## 2014-06-12 DIAGNOSIS — I340 Nonrheumatic mitral (valve) insufficiency: Secondary | ICD-10-CM | POA: Diagnosis not present

## 2014-06-12 DIAGNOSIS — I5033 Acute on chronic diastolic (congestive) heart failure: Secondary | ICD-10-CM | POA: Diagnosis not present

## 2014-06-12 NOTE — Telephone Encounter (Signed)
Spoke with Heather.

## 2014-06-12 NOTE — Telephone Encounter (Signed)
[facility]  [phone]  Manuela Schwartz ) Ext [phone] She is needing Verbal orders

## 2014-06-13 DIAGNOSIS — F0390 Unspecified dementia without behavioral disturbance: Secondary | ICD-10-CM | POA: Diagnosis not present

## 2014-06-13 DIAGNOSIS — I340 Nonrheumatic mitral (valve) insufficiency: Secondary | ICD-10-CM | POA: Diagnosis not present

## 2014-06-13 DIAGNOSIS — I5033 Acute on chronic diastolic (congestive) heart failure: Secondary | ICD-10-CM | POA: Diagnosis not present

## 2014-06-13 DIAGNOSIS — E119 Type 2 diabetes mellitus without complications: Secondary | ICD-10-CM | POA: Diagnosis not present

## 2014-06-13 DIAGNOSIS — S41101D Unspecified open wound of right upper arm, subsequent encounter: Secondary | ICD-10-CM | POA: Diagnosis not present

## 2014-06-13 DIAGNOSIS — G309 Alzheimer's disease, unspecified: Secondary | ICD-10-CM | POA: Diagnosis not present

## 2014-06-15 DIAGNOSIS — E119 Type 2 diabetes mellitus without complications: Secondary | ICD-10-CM | POA: Diagnosis not present

## 2014-06-15 DIAGNOSIS — F0390 Unspecified dementia without behavioral disturbance: Secondary | ICD-10-CM | POA: Diagnosis not present

## 2014-06-15 DIAGNOSIS — I340 Nonrheumatic mitral (valve) insufficiency: Secondary | ICD-10-CM | POA: Diagnosis not present

## 2014-06-15 DIAGNOSIS — S41101D Unspecified open wound of right upper arm, subsequent encounter: Secondary | ICD-10-CM | POA: Diagnosis not present

## 2014-06-15 DIAGNOSIS — G309 Alzheimer's disease, unspecified: Secondary | ICD-10-CM | POA: Diagnosis not present

## 2014-06-15 DIAGNOSIS — I5033 Acute on chronic diastolic (congestive) heart failure: Secondary | ICD-10-CM | POA: Diagnosis not present

## 2014-06-17 DIAGNOSIS — G309 Alzheimer's disease, unspecified: Secondary | ICD-10-CM | POA: Diagnosis not present

## 2014-06-17 DIAGNOSIS — E119 Type 2 diabetes mellitus without complications: Secondary | ICD-10-CM | POA: Diagnosis not present

## 2014-06-17 DIAGNOSIS — I5033 Acute on chronic diastolic (congestive) heart failure: Secondary | ICD-10-CM | POA: Diagnosis not present

## 2014-06-17 DIAGNOSIS — F0390 Unspecified dementia without behavioral disturbance: Secondary | ICD-10-CM | POA: Diagnosis not present

## 2014-06-17 DIAGNOSIS — I340 Nonrheumatic mitral (valve) insufficiency: Secondary | ICD-10-CM | POA: Diagnosis not present

## 2014-06-17 DIAGNOSIS — S41101D Unspecified open wound of right upper arm, subsequent encounter: Secondary | ICD-10-CM | POA: Diagnosis not present

## 2014-06-18 DIAGNOSIS — F0390 Unspecified dementia without behavioral disturbance: Secondary | ICD-10-CM | POA: Diagnosis not present

## 2014-06-18 DIAGNOSIS — S41101D Unspecified open wound of right upper arm, subsequent encounter: Secondary | ICD-10-CM | POA: Diagnosis not present

## 2014-06-18 DIAGNOSIS — G309 Alzheimer's disease, unspecified: Secondary | ICD-10-CM | POA: Diagnosis not present

## 2014-06-18 DIAGNOSIS — E119 Type 2 diabetes mellitus without complications: Secondary | ICD-10-CM | POA: Diagnosis not present

## 2014-06-18 DIAGNOSIS — I5033 Acute on chronic diastolic (congestive) heart failure: Secondary | ICD-10-CM | POA: Diagnosis not present

## 2014-06-18 DIAGNOSIS — I340 Nonrheumatic mitral (valve) insufficiency: Secondary | ICD-10-CM | POA: Diagnosis not present

## 2014-06-19 DIAGNOSIS — F0390 Unspecified dementia without behavioral disturbance: Secondary | ICD-10-CM | POA: Diagnosis not present

## 2014-06-19 DIAGNOSIS — I340 Nonrheumatic mitral (valve) insufficiency: Secondary | ICD-10-CM | POA: Diagnosis not present

## 2014-06-19 DIAGNOSIS — I5033 Acute on chronic diastolic (congestive) heart failure: Secondary | ICD-10-CM | POA: Diagnosis not present

## 2014-06-19 DIAGNOSIS — E119 Type 2 diabetes mellitus without complications: Secondary | ICD-10-CM | POA: Diagnosis not present

## 2014-06-19 DIAGNOSIS — G309 Alzheimer's disease, unspecified: Secondary | ICD-10-CM | POA: Diagnosis not present

## 2014-06-19 DIAGNOSIS — S41101D Unspecified open wound of right upper arm, subsequent encounter: Secondary | ICD-10-CM | POA: Diagnosis not present

## 2014-06-20 DIAGNOSIS — E119 Type 2 diabetes mellitus without complications: Secondary | ICD-10-CM | POA: Diagnosis not present

## 2014-06-20 DIAGNOSIS — I5033 Acute on chronic diastolic (congestive) heart failure: Secondary | ICD-10-CM | POA: Diagnosis not present

## 2014-06-20 DIAGNOSIS — S41101D Unspecified open wound of right upper arm, subsequent encounter: Secondary | ICD-10-CM | POA: Diagnosis not present

## 2014-06-20 DIAGNOSIS — I340 Nonrheumatic mitral (valve) insufficiency: Secondary | ICD-10-CM | POA: Diagnosis not present

## 2014-06-20 DIAGNOSIS — G309 Alzheimer's disease, unspecified: Secondary | ICD-10-CM | POA: Diagnosis not present

## 2014-06-20 DIAGNOSIS — F0390 Unspecified dementia without behavioral disturbance: Secondary | ICD-10-CM | POA: Diagnosis not present

## 2014-06-21 DIAGNOSIS — F0390 Unspecified dementia without behavioral disturbance: Secondary | ICD-10-CM | POA: Diagnosis not present

## 2014-06-21 DIAGNOSIS — E119 Type 2 diabetes mellitus without complications: Secondary | ICD-10-CM | POA: Diagnosis not present

## 2014-06-21 DIAGNOSIS — I5033 Acute on chronic diastolic (congestive) heart failure: Secondary | ICD-10-CM | POA: Diagnosis not present

## 2014-06-21 DIAGNOSIS — G309 Alzheimer's disease, unspecified: Secondary | ICD-10-CM | POA: Diagnosis not present

## 2014-06-21 DIAGNOSIS — I340 Nonrheumatic mitral (valve) insufficiency: Secondary | ICD-10-CM | POA: Diagnosis not present

## 2014-06-21 DIAGNOSIS — S41101D Unspecified open wound of right upper arm, subsequent encounter: Secondary | ICD-10-CM | POA: Diagnosis not present

## 2014-06-24 DIAGNOSIS — F0390 Unspecified dementia without behavioral disturbance: Secondary | ICD-10-CM | POA: Diagnosis not present

## 2014-06-24 DIAGNOSIS — S41101D Unspecified open wound of right upper arm, subsequent encounter: Secondary | ICD-10-CM | POA: Diagnosis not present

## 2014-06-24 DIAGNOSIS — I5033 Acute on chronic diastolic (congestive) heart failure: Secondary | ICD-10-CM | POA: Diagnosis not present

## 2014-06-24 DIAGNOSIS — I340 Nonrheumatic mitral (valve) insufficiency: Secondary | ICD-10-CM | POA: Diagnosis not present

## 2014-06-24 DIAGNOSIS — G309 Alzheimer's disease, unspecified: Secondary | ICD-10-CM | POA: Diagnosis not present

## 2014-06-24 DIAGNOSIS — E119 Type 2 diabetes mellitus without complications: Secondary | ICD-10-CM | POA: Diagnosis not present

## 2014-06-26 DIAGNOSIS — E119 Type 2 diabetes mellitus without complications: Secondary | ICD-10-CM | POA: Diagnosis not present

## 2014-06-26 DIAGNOSIS — G309 Alzheimer's disease, unspecified: Secondary | ICD-10-CM | POA: Diagnosis not present

## 2014-06-26 DIAGNOSIS — F0390 Unspecified dementia without behavioral disturbance: Secondary | ICD-10-CM | POA: Diagnosis not present

## 2014-06-26 DIAGNOSIS — I5033 Acute on chronic diastolic (congestive) heart failure: Secondary | ICD-10-CM | POA: Diagnosis not present

## 2014-06-26 DIAGNOSIS — S41101D Unspecified open wound of right upper arm, subsequent encounter: Secondary | ICD-10-CM | POA: Diagnosis not present

## 2014-06-26 DIAGNOSIS — I340 Nonrheumatic mitral (valve) insufficiency: Secondary | ICD-10-CM | POA: Diagnosis not present

## 2014-06-28 DIAGNOSIS — I5033 Acute on chronic diastolic (congestive) heart failure: Secondary | ICD-10-CM | POA: Diagnosis not present

## 2014-06-28 DIAGNOSIS — G309 Alzheimer's disease, unspecified: Secondary | ICD-10-CM | POA: Diagnosis not present

## 2014-06-28 DIAGNOSIS — E119 Type 2 diabetes mellitus without complications: Secondary | ICD-10-CM | POA: Diagnosis not present

## 2014-06-28 DIAGNOSIS — I340 Nonrheumatic mitral (valve) insufficiency: Secondary | ICD-10-CM | POA: Diagnosis not present

## 2014-06-28 DIAGNOSIS — F0390 Unspecified dementia without behavioral disturbance: Secondary | ICD-10-CM | POA: Diagnosis not present

## 2014-06-28 DIAGNOSIS — S41101D Unspecified open wound of right upper arm, subsequent encounter: Secondary | ICD-10-CM | POA: Diagnosis not present

## 2014-07-01 DIAGNOSIS — I5033 Acute on chronic diastolic (congestive) heart failure: Secondary | ICD-10-CM | POA: Diagnosis not present

## 2014-07-01 DIAGNOSIS — S41101D Unspecified open wound of right upper arm, subsequent encounter: Secondary | ICD-10-CM | POA: Diagnosis not present

## 2014-07-01 DIAGNOSIS — I340 Nonrheumatic mitral (valve) insufficiency: Secondary | ICD-10-CM | POA: Diagnosis not present

## 2014-07-01 DIAGNOSIS — G309 Alzheimer's disease, unspecified: Secondary | ICD-10-CM | POA: Diagnosis not present

## 2014-07-01 DIAGNOSIS — F0390 Unspecified dementia without behavioral disturbance: Secondary | ICD-10-CM | POA: Diagnosis not present

## 2014-07-01 DIAGNOSIS — E119 Type 2 diabetes mellitus without complications: Secondary | ICD-10-CM | POA: Diagnosis not present

## 2014-07-03 DIAGNOSIS — E119 Type 2 diabetes mellitus without complications: Secondary | ICD-10-CM | POA: Diagnosis not present

## 2014-07-03 DIAGNOSIS — I5033 Acute on chronic diastolic (congestive) heart failure: Secondary | ICD-10-CM | POA: Diagnosis not present

## 2014-07-03 DIAGNOSIS — F0390 Unspecified dementia without behavioral disturbance: Secondary | ICD-10-CM | POA: Diagnosis not present

## 2014-07-03 DIAGNOSIS — I340 Nonrheumatic mitral (valve) insufficiency: Secondary | ICD-10-CM | POA: Diagnosis not present

## 2014-07-03 DIAGNOSIS — S41101D Unspecified open wound of right upper arm, subsequent encounter: Secondary | ICD-10-CM | POA: Diagnosis not present

## 2014-07-03 DIAGNOSIS — G309 Alzheimer's disease, unspecified: Secondary | ICD-10-CM | POA: Diagnosis not present

## 2014-07-08 ENCOUNTER — Telehealth: Payer: Self-pay | Admitting: Internal Medicine

## 2014-07-08 ENCOUNTER — Other Ambulatory Visit: Payer: Self-pay | Admitting: Geriatric Medicine

## 2014-07-08 MED ORDER — HYDROXYZINE HCL 10 MG PO TABS: ORAL_TABLET | ORAL | Status: DC

## 2014-07-08 NOTE — Telephone Encounter (Signed)
Pt wife called in and said that pt needs refill on hydrOXYzine (ATARAX/VISTARIL) 10 MG tablet [[id number]]  Sent to prim mail.  He mail order pharmacy

## 2014-07-08 NOTE — Telephone Encounter (Signed)
Sent to pharmacy 

## 2014-07-09 ENCOUNTER — Other Ambulatory Visit (INDEPENDENT_AMBULATORY_CARE_PROVIDER_SITE_OTHER): Payer: Medicare Other

## 2014-07-09 ENCOUNTER — Ambulatory Visit (INDEPENDENT_AMBULATORY_CARE_PROVIDER_SITE_OTHER): Payer: Medicare Other | Admitting: Internal Medicine

## 2014-07-09 ENCOUNTER — Encounter: Payer: Self-pay | Admitting: Internal Medicine

## 2014-07-09 ENCOUNTER — Ambulatory Visit (INDEPENDENT_AMBULATORY_CARE_PROVIDER_SITE_OTHER)
Admission: RE | Admit: 2014-07-09 | Discharge: 2014-07-09 | Disposition: A | Discharge status: Home / Self Care | Payer: Medicare Other | Source: Ambulatory Visit | Attending: Internal Medicine | Admitting: Internal Medicine

## 2014-07-09 VITALS — BP 130/70 | HR 79 | Temp 98.3°F | Resp 18 | Ht 75.0 in | Wt 255.0 lb

## 2014-07-09 DIAGNOSIS — R05 Cough: Secondary | ICD-10-CM | POA: Diagnosis not present

## 2014-07-09 DIAGNOSIS — J309 Allergic rhinitis, unspecified: Secondary | ICD-10-CM | POA: Diagnosis not present

## 2014-07-09 DIAGNOSIS — R0602 Shortness of breath: Secondary | ICD-10-CM | POA: Diagnosis not present

## 2014-07-09 DIAGNOSIS — R059 Cough, unspecified: Secondary | ICD-10-CM

## 2014-07-09 DIAGNOSIS — R5383 Other fatigue: Secondary | ICD-10-CM | POA: Diagnosis not present

## 2014-07-09 LAB — BASIC METABOLIC PANEL
BUN: 9 mg/dL (ref 6–23)
CHLORIDE: 101 meq/L (ref 96–112)
CO2: 31 mEq/L (ref 19–32)
Calcium: 8.9 mg/dL (ref 8.4–10.5)
Creatinine, Ser: 0.99 mg/dL (ref 0.40–1.50)
GFR: 76.32 mL/min (ref >60.00)
Glucose, Bld: 108 mg/dL — ABNORMAL HIGH (ref 70–99)
Potassium: 4.2 mEq/L (ref 3.5–5.1)
Sodium: 136 mEq/L (ref 135–145)

## 2014-07-09 LAB — CBC WITH DIFFERENTIAL/PLATELET
Basophils Absolute: 0 10*3/uL (ref 0.0–0.1)
Basophils Relative: 0.6 % (ref 0.0–3.0)
EOS PCT: 1.2 % (ref 0.0–5.0)
Eosinophils Absolute: 0.1 10*3/uL (ref 0.0–0.7)
HCT: 23 % — CL (ref 39.0–52.0)
Hemoglobin: 7.1 g/dL — CL (ref 13.0–17.0)
LYMPHS PCT: 9.1 % — AB (ref 12.0–46.0)
Lymphs Abs: 0.5 10*3/uL — ABNORMAL LOW (ref 0.7–4.0)
MCHC: 30.9 g/dL (ref 30.0–36.0)
MCV: 81.4 fl (ref 78.0–100.0)
MONOS PCT: 14 % — AB (ref 3.0–12.0)
Monocytes Absolute: 0.7 10*3/uL (ref 0.1–1.0)
NEUTROS ABS: 3.9 10*3/uL (ref 1.4–7.7)
Neutrophils Relative %: 75.1 % (ref 43.0–77.0)
Platelets: 194 10*3/uL (ref 150.0–400.0)
RBC: 2.8 Mil/uL — AB (ref 4.22–5.81)
RDW: 19.2 % — ABNORMAL HIGH (ref 11.5–15.5)
WBC: 5.2 10*3/uL (ref 4.0–10.5)

## 2014-07-09 LAB — TSH: TSH: 3.19 u[IU]/mL (ref 0.35–4.50)

## 2014-07-09 LAB — HEPATIC FUNCTION PANEL
ALT: 10 U/L (ref 0–53)
AST: 14 U/L (ref 0–37)
Albumin: 3.7 g/dL (ref 3.5–5.2)
Alkaline Phosphatase: 80 U/L (ref 39–117)
BILIRUBIN TOTAL: 0.3 mg/dL (ref 0.2–1.2)
Bilirubin, Direct: 0.1 mg/dL (ref 0.0–0.3)
Total Protein: 6 g/dL (ref 6.0–8.3)

## 2014-07-09 MED ORDER — METHYLPREDNISOLONE ACETATE 80 MG/ML IJ SUSP
80.0000 mg | Freq: Once | INTRAMUSCULAR | Status: AC
  Administered 2014-07-09: 80 mg via INTRAMUSCULAR

## 2014-07-09 MED ORDER — TRIAMCINOLONE ACETONIDE 55 MCG/ACT NA AERO: 2.0000 | INHALATION_SPRAY | Freq: Every day | NASAL | Status: DC

## 2014-07-09 MED ORDER — FEXOFENADINE HCL 180 MG PO TABS: 180.0000 mg | ORAL_TABLET | Freq: Every day | ORAL | Status: DC

## 2014-07-09 NOTE — Assessment & Plan Note (Addendum)
Etiology unclear, hgb 8.5 recently, no overt bleeding, not on anticoagulant,  Exam otherwise benign, to check labs as documented, follow with expectant management

## 2014-07-09 NOTE — Patient Instructions (Signed)
You had the steroid shot today  Please take all new medication as prescribed - the allegra and nasacort for allergies  Please continue all other medications as before, and refills have been done if requested.  Please have the pharmacy call with any other refills you may need.  Please continue your efforts at being more active, low cholesterol diet, and weight control.  Please keep your appointments with your specialists as you may have planned  Please go to the XRAY Department in the Basement (go straight as you get off the elevator) for the x-ray testing  Please go to the LAB in the Basement (turn left off the elevator) for the tests to be done today  You will be contacted by phone if any changes need to be made immediately.  Otherwise, you will receive a letter about your results with an explanation, but please check with MyChart first.  Please remember to sign up for MyChart if you have not done so, as this will be important to you in the future with finding out test results, communicating by private email, and scheduling acute appointments online when needed.

## 2014-07-09 NOTE — Assessment & Plan Note (Signed)
Mild to mod, for depomedrol IM, then allegra/nasacort asd,  to f/u any worsening symptoms or concerns

## 2014-07-09 NOTE — Assessment & Plan Note (Signed)
I suspect related to post nasal gtt, but also has bibas rales, cannot r/o volume overload - to cont same meds for now, check cxr

## 2014-07-09 NOTE — Progress Notes (Signed)
Pre visit review using our clinic review tool, if applicable. No additional management support is needed unless otherwise documented below in the visit note. 

## 2014-07-09 NOTE — Progress Notes (Signed)
Subjective:    Patient ID: [name], male    DOB: May 04, 1927, 81 y.o.   MRN: [id number]  HPI  Here with daughter/wife with 1 mo onset prod cough whitish/cleer without fever; pt hx limited due to dementia, but family helps. Has runny nose constantly and ongoing cough, but no CP, wheezing, sob.   Does have several wks ongoing nasal allergy symptoms with clearish congestion, itch and sneezing, without fever, pain, ST, cough, swelling or wheezing. Does c/o ongoing fatigue, but denies signficant daytime hypersomnolence. Walks with walker, no recent falls, but wife mentions has more difficulty standing.   Pt denies fever, wt loss, night sweats, loss of appetite, or other constitutional symptoms Past Medical History  Diagnosis Date  . Arrhythmia     a fib  . ED (erectile dysfunction)   . Memory disorder   . 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 Hematochezia     has intermittent problem  . Arthritis     DJD knee - left  . Restless leg     treats with heat  . Cancer     bladder cancer - BCG [name]  . Diabetes mellitus     NIDDM on oral medication  . Genital warts     recurrent. Not a problem  . GERD (gastroesophageal reflux disease)     treated with PPI  . Hypertension     mild  . Neuropathy     burning discomfort feet/legs  . Irritable bladder    Past Surgical History  Procedure Laterality Date  . Cardiovascular stress test  03/28/2003    EF 55%.  Normal cardiolite study  . Appendectomy  1946  . Tonsillectomy  1938  . Joint replacement  1990's    left TKR  . Knee arthroscopy  1980's    right knee    reports that he quit smoking about 38 years ago. He has quit using smokeless tobacco. He reports that he drinks about 4.2 oz of alcohol per week. He reports that he does not use illicit drugs. family history includes Cancer in his father; Heart disease in his mother; Kidney disease in his mother; Mental illness in his brother; Stroke in his mother. There is  no history of Diabetes or COPD. Allergies  Allergen Reactions  . Cephalexin Other (See Comments)    Unknown reaction  . Furacin [Nitrofurazone] Other (See Comments)    Unknown reaction  . Macrodantin Other (See Comments)    Unknown reaction  . Zestril [Lisinopril] Other (See Comments)    Unknown reaction   Current Outpatient Prescriptions on File Prior to Visit  Medication Sig Dispense Refill  . aspirin 81 MG tablet Take 81 mg by mouth daily.    . cholecalciferol (VITAMIN D) 1000 UNITS tablet Take 1,000 Units by mouth daily.    . COD LIVER OIL PO Take by mouth daily.    [name] donepezil (ARICEPT) 10 MG tablet Take 1 tablet (10 mg total) by mouth at bedtime. 90 tablet 3  . ferrous sulfate 325 (65 FE) MG tablet Take 325 mg by mouth as directed. 1 tablet 3 times a day    . folic acid (FOLVITE) 1 MG tablet Take 1 mg by mouth daily.    . furosemide (LASIX) 40 MG tablet Take 1 tablet (40 mg total) by mouth every morning. 135 tablet 3  . gabapentin (NEURONTIN) 100 MG capsule Take 100 mg by mouth at bedtime.    [name]  glipiZIDE (GLUCOTROL) 5 MG tablet Take 5 mg by mouth daily.  2  . hydrocortisone 1 % ointment Apply 1 application topically 2 (two) times daily. 30 g 0  . hydrOXYzine (ATARAX/VISTARIL) 10 MG tablet TAKE (1) TABLET TWICE A DAY AS NEEDED. 180 tablet 3  . losartan (COZAAR) 50 MG tablet Take 50 mg by mouth daily.  2  . Memantine HCl ER (NAMENDA XR) 28 MG CP24 Take 28 mg by mouth daily. 90 capsule 4  . metoprolol tartrate (LOPRESSOR) 25 MG tablet TAKE 1/2 BY MOUTH EVERY MORNING (GENERIC FOR LOPRESSOR) 45 tablet 1  . nitroGLYCERIN (NITROSTAT) 0.4 MG SL tablet Place 1 tablet (0.4 mg total) under the tongue every 5 (five) minutes as needed for chest pain. 100 tablet 0  . pantoprazole (PROTONIX) 40 MG tablet Take 40 mg by mouth daily.  0  . Polyethylene Glycol 3350 (GLYCOLAX PO) Take 1 packet by mouth daily as needed. For constipation    . solifenacin (VESICARE) 5 MG tablet Take 1 tablet (5 mg  total) by mouth daily. 90 tablet 1   No current facility-administered medications on file prior to visit.    Review of Systems  Constitutional: Negative for unusual diaphoresis or night sweats HENT: Negative for ringing in ear or discharge Eyes: Negative for double vision or worsening visual disturbance.  Respiratory: Negative for choking and stridor.   Gastrointestinal: Negative for vomiting or other signifcant bowel change Genitourinary: Negative for hematuria or change in urine volume.  Musculoskeletal: Negative for other MSK pain or swelling Skin: Negative for color change and worsening wound.  Neurological: Negative for tremors and numbness other than noted  Psychiatric/Behavioral: Negative for decreased concentration or agitation other than above       Objective:   Physical Exam BP 130/70 mmHg  Pulse 79  Temp(Src) 98.3 F (36.8 C) (Oral)  Resp 18  Ht 6\' 3"  (1.905 m)  Wt 255 lb (115.667 kg)  BMI 31.87 kg/m2  SpO2 96% VS noted, not ill appearing, but elderly, fatigued for age and comorbids Constitutional: Pt appears in no significant distress HENT: Head: NCAT.  Right Ear: External ear normal.  Left Ear: External ear normal.  Bilat tm's with mild erythema.  Max sinus areas non tender.  Pharynx with mild erythema, no exudate Eyes: . Pupils are equal, round, and reactive to light. Conjunctivae and EOM are normal Neck: Normal range of motion. Neck supple.  Cardiovascular: Normal rate and regular rhythm.   Pulmonary/Chest: Effort normal and breath sounds without wheezing but + bibas rales  Abd:  Soft, NT, ND, + BS Neurological: Pt is alert. At baseline confused per family , motor grossly intact Skin: Skin is warm. No rash, no LE edema Psychiatric: Pt behavior is normal. No agitation.     Assessment & Plan:

## 2014-07-10 ENCOUNTER — Inpatient Hospital Stay (HOSPITAL_COMMUNITY)
Admission: EM | Admit: 2014-07-10 | Discharge: 2014-07-13 | DRG: 195 | Disposition: A | Discharge status: Home / Self Care | Payer: Medicare Other | Attending: Internal Medicine | Admitting: Internal Medicine

## 2014-07-10 ENCOUNTER — Encounter (HOSPITAL_COMMUNITY): Payer: Self-pay | Admitting: Emergency Medicine

## 2014-07-10 ENCOUNTER — Other Ambulatory Visit (HOSPITAL_COMMUNITY): Payer: Self-pay

## 2014-07-10 DIAGNOSIS — D509 Iron deficiency anemia, unspecified: Secondary | ICD-10-CM | POA: Insufficient documentation

## 2014-07-10 DIAGNOSIS — K219 Gastro-esophageal reflux disease without esophagitis: Secondary | ICD-10-CM | POA: Diagnosis present

## 2014-07-10 DIAGNOSIS — I4891 Unspecified atrial fibrillation: Secondary | ICD-10-CM | POA: Diagnosis present

## 2014-07-10 DIAGNOSIS — F0390 Unspecified dementia without behavioral disturbance: Secondary | ICD-10-CM | POA: Diagnosis present

## 2014-07-10 DIAGNOSIS — Z96652 Presence of left artificial knee joint: Secondary | ICD-10-CM | POA: Diagnosis present

## 2014-07-10 DIAGNOSIS — I119 Hypertensive heart disease without heart failure: Secondary | ICD-10-CM | POA: Diagnosis present

## 2014-07-10 DIAGNOSIS — Z888 Allergy status to other drugs, medicaments and biological substances status: Secondary | ICD-10-CM

## 2014-07-10 DIAGNOSIS — Z8551 Personal history of malignant neoplasm of bladder: Secondary | ICD-10-CM | POA: Diagnosis not present

## 2014-07-10 DIAGNOSIS — I872 Venous insufficiency (chronic) (peripheral): Secondary | ICD-10-CM | POA: Diagnosis present

## 2014-07-10 DIAGNOSIS — R531 Weakness: Secondary | ICD-10-CM | POA: Diagnosis not present

## 2014-07-10 DIAGNOSIS — R031 Nonspecific low blood-pressure reading: Secondary | ICD-10-CM | POA: Diagnosis not present

## 2014-07-10 DIAGNOSIS — D649 Anemia, unspecified: Secondary | ICD-10-CM

## 2014-07-10 DIAGNOSIS — D500 Iron deficiency anemia secondary to blood loss (chronic): Secondary | ICD-10-CM | POA: Diagnosis present

## 2014-07-10 DIAGNOSIS — M1990 Unspecified osteoarthritis, unspecified site: Secondary | ICD-10-CM | POA: Diagnosis present

## 2014-07-10 DIAGNOSIS — I482 Chronic atrial fibrillation: Secondary | ICD-10-CM

## 2014-07-10 DIAGNOSIS — Z87891 Personal history of nicotine dependence: Secondary | ICD-10-CM

## 2014-07-10 DIAGNOSIS — Z7982 Long term (current) use of aspirin: Secondary | ICD-10-CM | POA: Diagnosis not present

## 2014-07-10 DIAGNOSIS — J189 Pneumonia, unspecified organism: Principal | ICD-10-CM | POA: Diagnosis present

## 2014-07-10 DIAGNOSIS — G629 Polyneuropathy, unspecified: Secondary | ICD-10-CM | POA: Diagnosis present

## 2014-07-10 DIAGNOSIS — E119 Type 2 diabetes mellitus without complications: Secondary | ICD-10-CM | POA: Diagnosis not present

## 2014-07-10 DIAGNOSIS — R0602 Shortness of breath: Secondary | ICD-10-CM | POA: Diagnosis not present

## 2014-07-10 LAB — IRON AND TIBC
Iron: 21 ug/dL — ABNORMAL LOW (ref 42–165)
SATURATION RATIOS: 6 % — AB (ref 20–55)
TIBC: 360 ug/dL (ref 215–435)
UIBC: 339 ug/dL (ref 125–400)

## 2014-07-10 LAB — URINALYSIS, ROUTINE W REFLEX MICROSCOPIC
Bilirubin Urine: NEGATIVE
GLUCOSE, UA: NEGATIVE mg/dL
Hgb urine dipstick: NEGATIVE
Ketones, ur: NEGATIVE mg/dL
LEUKOCYTES UA: NEGATIVE
Nitrite: NEGATIVE
PROTEIN: NEGATIVE mg/dL
SPECIFIC GRAVITY, URINE: 1.01 (ref 1.005–1.030)
Urobilinogen, UA: 0.2 mg/dL (ref 0.0–1.0)
pH: 7.5 (ref 5.0–8.0)

## 2014-07-10 LAB — CBC
HCT: 23.5 % — ABNORMAL LOW (ref 39.0–52.0)
Hemoglobin: 6.7 g/dL — CL (ref 13.0–17.0)
MCH: 24.8 pg — ABNORMAL LOW (ref 26.0–34.0)
MCHC: 28.5 g/dL — ABNORMAL LOW (ref 30.0–36.0)
MCV: 87 fL (ref 78.0–100.0)
PLATELETS: 159 10*3/uL (ref 150–400)
RBC: 2.7 MIL/uL — AB (ref 4.22–5.81)
RDW: 17.7 % — ABNORMAL HIGH (ref 11.5–15.5)
WBC: 3.4 10*3/uL — ABNORMAL LOW (ref 4.0–10.5)

## 2014-07-10 LAB — GLUCOSE, CAPILLARY
GLUCOSE-CAPILLARY: 100 mg/dL — AB (ref 70–99)
Glucose-Capillary: 109 mg/dL — ABNORMAL HIGH (ref 70–99)

## 2014-07-10 LAB — BASIC METABOLIC PANEL
Anion gap: 7 (ref 5–15)
BUN: 10 mg/dL (ref 6–23)
CHLORIDE: 104 mmol/L (ref 96–112)
CO2: 28 mmol/L (ref 19–32)
CREATININE: 0.97 mg/dL (ref 0.50–1.35)
Calcium: 8.3 mg/dL — ABNORMAL LOW (ref 8.4–10.5)
GFR calc Af Amer: 85 mL/min — ABNORMAL LOW (ref >90)
GFR calc non Af Amer: 73 mL/min — ABNORMAL LOW (ref >90)
Glucose, Bld: 134 mg/dL — ABNORMAL HIGH (ref 70–99)
Potassium: 3.9 mmol/L (ref 3.5–5.1)
SODIUM: 139 mmol/L (ref 135–145)

## 2014-07-10 LAB — HEPATIC FUNCTION PANEL
ALBUMIN: 3.4 g/dL — AB (ref 3.5–5.2)
ALT: 12 U/L (ref 0–53)
AST: 18 U/L (ref 0–37)
Alkaline Phosphatase: 68 U/L (ref 39–117)
Bilirubin, Direct: 0.1 mg/dL (ref 0.0–0.5)
Total Protein: 5.5 g/dL — ABNORMAL LOW (ref 6.0–8.3)

## 2014-07-10 LAB — RETICULOCYTES
RBC.: 2.7 MIL/uL — AB (ref 4.22–5.81)
Retic Count, Absolute: 91.8 10*3/uL (ref 19.0–186.0)
Retic Ct Pct: 3.4 % — ABNORMAL HIGH (ref 0.4–3.1)

## 2014-07-10 LAB — PREPARE RBC (CROSSMATCH)

## 2014-07-10 LAB — I-STAT TROPONIN, ED: TROPONIN I, POC: 0.01 ng/mL (ref 0.00–0.08)

## 2014-07-10 LAB — VITAMIN B12: Vitamin B-12: 836 pg/mL (ref 211–911)

## 2014-07-10 LAB — FERRITIN: Ferritin: 11 ng/mL — ABNORMAL LOW (ref 22–322)

## 2014-07-10 LAB — ABO/RH: ABO/RH(D): A POS

## 2014-07-10 LAB — STREP PNEUMONIAE URINARY ANTIGEN: Strep Pneumo Urinary Antigen: NEGATIVE

## 2014-07-10 LAB — BRAIN NATRIURETIC PEPTIDE: B NATRIURETIC PEPTIDE 5: 149.3 pg/mL — AB (ref 0.0–100.0)

## 2014-07-10 LAB — POC OCCULT BLOOD, ED: Fecal Occult Bld: POSITIVE — AB

## 2014-07-10 LAB — FOLATE

## 2014-07-10 MED ORDER — ACETAMINOPHEN 650 MG RE SUPP: 650.0000 mg | Freq: Four times a day (QID) | RECTAL | Status: DC | PRN

## 2014-07-10 MED ORDER — SODIUM CHLORIDE 0.9 % IV SOLN
Freq: Once | INTRAVENOUS | Status: AC
  Administered 2014-07-10: 10 mL/h via INTRAVENOUS

## 2014-07-10 MED ORDER — SODIUM CHLORIDE 0.9 % IJ SOLN
3.0000 mL | Freq: Two times a day (BID) | INTRAMUSCULAR | Status: DC
  Administered 2014-07-11: 3 mL via INTRAVENOUS

## 2014-07-10 MED ORDER — VITAMIN B-1 100 MG PO TABS
100.0000 mg | ORAL_TABLET | Freq: Every day | ORAL | Status: DC
  Administered 2014-07-10 – 2014-07-13 (×4): 100 mg via ORAL
  Filled 2014-07-10 (×4): qty 1

## 2014-07-10 MED ORDER — DOCUSATE SODIUM 100 MG PO CAPS
100.0000 mg | ORAL_CAPSULE | Freq: Two times a day (BID) | ORAL | Status: DC
  Administered 2014-07-10 – 2014-07-13 (×6): 100 mg via ORAL
  Filled 2014-07-10 (×7): qty 1

## 2014-07-10 MED ORDER — MEMANTINE HCL ER 28 MG PO CP24
28.0000 mg | ORAL_CAPSULE | Freq: Every day | ORAL | Status: DC
  Administered 2014-07-10 – 2014-07-13 (×4): 28 mg via ORAL
  Filled 2014-07-10 (×4): qty 1

## 2014-07-10 MED ORDER — LEVOFLOXACIN IN D5W 750 MG/150ML IV SOLN
750.0000 mg | INTRAVENOUS | Status: DC
  Administered 2014-07-11 – 2014-07-12 (×2): 750 mg via INTRAVENOUS
  Filled 2014-07-10 (×3): qty 150

## 2014-07-10 MED ORDER — LORATADINE 10 MG PO TABS
10.0000 mg | ORAL_TABLET | Freq: Every day | ORAL | Status: DC
  Administered 2014-07-10 – 2014-07-13 (×4): 10 mg via ORAL
  Filled 2014-07-10 (×4): qty 1

## 2014-07-10 MED ORDER — LEVOFLOXACIN IN D5W 750 MG/150ML IV SOLN
750.0000 mg | Freq: Once | INTRAVENOUS | Status: AC
  Administered 2014-07-10: 750 mg via INTRAVENOUS
  Filled 2014-07-10: qty 150

## 2014-07-10 MED ORDER — ALBUTEROL SULFATE (2.5 MG/3ML) 0.083% IN NEBU: 2.5000 mg | INHALATION_SOLUTION | RESPIRATORY_TRACT | Status: DC | PRN

## 2014-07-10 MED ORDER — FERROUS SULFATE 325 (65 FE) MG PO TABS
325.0000 mg | ORAL_TABLET | Freq: Two times a day (BID) | ORAL | Status: DC
  Administered 2014-07-10 – 2014-07-13 (×6): 325 mg via ORAL
  Filled 2014-07-10 (×7): qty 1

## 2014-07-10 MED ORDER — ONDANSETRON HCL 4 MG PO TABS: 4.0000 mg | ORAL_TABLET | Freq: Four times a day (QID) | ORAL | Status: DC | PRN

## 2014-07-10 MED ORDER — SODIUM CHLORIDE 0.9 % IJ SOLN: 3.0000 mL | Freq: Two times a day (BID) | INTRAMUSCULAR | Status: DC

## 2014-07-10 MED ORDER — FUROSEMIDE 40 MG PO TABS
40.0000 mg | ORAL_TABLET | Freq: Every morning | ORAL | Status: DC
  Administered 2014-07-11 – 2014-07-13 (×3): 40 mg via ORAL
  Filled 2014-07-10 (×3): qty 1

## 2014-07-10 MED ORDER — POLYETHYLENE GLYCOL 3350 17 G PO PACK: 17.0000 g | PACK | Freq: Every day | ORAL | Status: DC | PRN

## 2014-07-10 MED ORDER — SODIUM CHLORIDE 0.9 % IJ SOLN: 3.0000 mL | INTRAMUSCULAR | Status: DC | PRN

## 2014-07-10 MED ORDER — FUROSEMIDE 10 MG/ML IJ SOLN
20.0000 mg | Freq: Once | INTRAMUSCULAR | Status: AC
  Administered 2014-07-10: 20 mg via INTRAVENOUS
  Filled 2014-07-10: qty 2

## 2014-07-10 MED ORDER — FOLIC ACID 1 MG PO TABS
1.0000 mg | ORAL_TABLET | Freq: Every day | ORAL | Status: DC
  Administered 2014-07-11 – 2014-07-13 (×3): 1 mg via ORAL
  Filled 2014-07-10 (×3): qty 1

## 2014-07-10 MED ORDER — DONEPEZIL HCL 10 MG PO TABS
10.0000 mg | ORAL_TABLET | Freq: Every day | ORAL | Status: DC
  Administered 2014-07-10 – 2014-07-12 (×3): 10 mg via ORAL
  Filled 2014-07-10 (×4): qty 1

## 2014-07-10 MED ORDER — INSULIN ASPART 100 UNIT/ML ~~LOC~~ SOLN
0.0000 [IU] | Freq: Three times a day (TID) | SUBCUTANEOUS | Status: DC
  Administered 2014-07-12: 2 [IU] via SUBCUTANEOUS

## 2014-07-10 MED ORDER — ONDANSETRON HCL 4 MG/2ML IJ SOLN: 4.0000 mg | Freq: Four times a day (QID) | INTRAMUSCULAR | Status: DC | PRN

## 2014-07-10 MED ORDER — GABAPENTIN 100 MG PO CAPS
100.0000 mg | ORAL_CAPSULE | Freq: Every day | ORAL | Status: DC
  Administered 2014-07-10 – 2014-07-12 (×3): 100 mg via ORAL
  Filled 2014-07-10 (×4): qty 1

## 2014-07-10 MED ORDER — SODIUM CHLORIDE 0.9 % IV SOLN: Freq: Once | INTRAVENOUS | Status: DC

## 2014-07-10 MED ORDER — HYDROXYZINE HCL 10 MG PO TABS
10.0000 mg | ORAL_TABLET | Freq: Two times a day (BID) | ORAL | Status: DC | PRN
  Administered 2014-07-11 – 2014-07-12 (×2): 10 mg via ORAL
  Filled 2014-07-10 (×4): qty 1

## 2014-07-10 MED ORDER — PANTOPRAZOLE SODIUM 40 MG PO TBEC
40.0000 mg | DELAYED_RELEASE_TABLET | Freq: Every day | ORAL | Status: DC
  Administered 2014-07-11 – 2014-07-13 (×3): 40 mg via ORAL
  Filled 2014-07-10 (×3): qty 1

## 2014-07-10 MED ORDER — SODIUM CHLORIDE 0.9 % IV SOLN: 250.0000 mL | INTRAVENOUS | Status: DC | PRN

## 2014-07-10 MED ORDER — ACETAMINOPHEN 325 MG PO TABS: 650.0000 mg | ORAL_TABLET | Freq: Four times a day (QID) | ORAL | Status: DC | PRN

## 2014-07-10 MED ORDER — METOPROLOL TARTRATE 12.5 MG HALF TABLET
12.5000 mg | ORAL_TABLET | Freq: Every day | ORAL | Status: DC
  Administered 2014-07-11 – 2014-07-13 (×3): 12.5 mg via ORAL
  Filled 2014-07-10 (×3): qty 1

## 2014-07-10 MED ORDER — DARIFENACIN HYDROBROMIDE ER 7.5 MG PO TB24
7.5000 mg | ORAL_TABLET | Freq: Every day | ORAL | Status: DC
  Administered 2014-07-10 – 2014-07-13 (×4): 7.5 mg via ORAL
  Filled 2014-07-10 (×4): qty 1

## 2014-07-10 NOTE — ED Provider Notes (Signed)
CSN: [id number]     Arrival date & time 07/10/14  1209 History   First MD Initiated Contact with Patient 07/10/14 1238     Chief Complaint  Patient presents with  . Shortness of Breath  . Weakness     (Consider location/radiation/quality/duration/timing/severity/associated sxs/prior Treatment) Patient is a 81 y.o. male presenting with shortness of breath and weakness. The history is provided by the patient.  Shortness of Breath Associated symptoms: cough   Associated symptoms: no abdominal pain, no chest pain, no fever, no headaches, no neck pain and no vomiting   Cough:    Cough characteristics:  Productive   Sputum characteristics:  White   Severity:  Mild   Onset quality:  Gradual   Duration:  2 weeks   Timing:  Constant   Progression:  Unchanged   Chronicity:  New Weakness This is a new problem. The current episode started more than 2 days ago. The problem occurs constantly. The problem has been gradually worsening. Pertinent negatives include no chest pain, no abdominal pain, no headaches and no shortness of breath. Nothing aggravates the symptoms. Nothing relieves the symptoms. He has tried nothing for the symptoms. The treatment provided no relief.    Past Medical History  Diagnosis Date  . Arrhythmia     a fib  . ED (erectile dysfunction)   . Memory disorder   . 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 Hematochezia     has intermittent problem  . Arthritis     DJD knee - left  . Restless leg     treats with heat  . Cancer     bladder cancer - BCG [name]  . Diabetes mellitus     NIDDM on oral medication  . Genital warts     recurrent. Not a problem  . GERD (gastroesophageal reflux disease)     treated with PPI  . Hypertension     mild  . Neuropathy     burning discomfort feet/legs  . Irritable bladder    Past Surgical History  Procedure Laterality Date  . Cardiovascular stress test  03/28/2003    EF 55%.  Normal cardiolite study  .  Appendectomy  1946  . Tonsillectomy  1938  . Joint replacement  1990's    left TKR  . Knee arthroscopy  1980's    right knee   Family History  Problem Relation Age of Onset  . Kidney disease Mother   . Heart disease Mother   . Stroke Mother   . Cancer Father     prostate  . Diabetes Neg Hx   . COPD Neg Hx   . Mental illness Brother    History  Substance Use Topics  . Smoking status: Former Smoker    Quit date: 08/28/1975  . Smokeless tobacco: Former Systems developer  . Alcohol Use: 4.2 oz/week    7 Shots of liquor per week    Review of Systems  Constitutional: Negative for fever.  HENT: Negative for drooling and rhinorrhea.   Eyes: Negative for pain.  Respiratory: Positive for cough. Negative for shortness of breath.   Cardiovascular: Negative for chest pain and leg swelling.  Gastrointestinal: Negative for nausea, vomiting, abdominal pain and diarrhea.  Genitourinary: Negative for dysuria and hematuria.  Musculoskeletal: Negative for gait problem and neck pain.  Skin: Negative for color change.  Neurological: Positive for weakness (generalized). Negative for numbness and headaches.  Hematological: Negative for adenopathy.  Psychiatric/Behavioral: Negative  for behavioral problems.  All other systems reviewed and are negative.     Allergies  Cephalexin; Furacin; Macrodantin; and Zestril  Home Medications   Prior to Admission medications   Medication Sig Start Date End Date Taking? Authorizing Provider  donepezil (ARICEPT) 10 MG tablet Take 1 tablet (10 mg total) by mouth at bedtime. 12/28/13  Yes Olga Millers, MD  aspirin 81 MG tablet Take 81 mg by mouth daily.    Historical Provider, MD  cholecalciferol (VITAMIN D) 1000 UNITS tablet Take 1,000 Units by mouth daily.    Historical Provider, MD  COD LIVER OIL PO Take by mouth daily.    Historical Provider, MD  ferrous sulfate 325 (65 FE) MG tablet Take 325 mg by mouth as directed. 1 tablet 3 times a day    Historical  Provider, MD  fexofenadine (ALLEGRA) 180 MG tablet Take 1 tablet (180 mg total) by mouth daily. 07/09/14   Biagio Borg, MD  folic acid (FOLVITE) 1 MG tablet Take 1 mg by mouth daily.    Historical Provider, MD  furosemide (LASIX) 40 MG tablet Take 1 tablet (40 mg total) by mouth every morning. 05/23/14   Olga Millers, MD  gabapentin (NEURONTIN) 100 MG capsule Take 100 mg by mouth at bedtime.    Historical Provider, MD  glipiZIDE (GLUCOTROL) 5 MG tablet Take 5 mg by mouth daily. 04/21/14   Historical Provider, MD  hydrocortisone 1 % ointment Apply 1 application topically 2 (two) times daily. 05/23/14   Olga Millers, MD  hydrOXYzine (ATARAX/VISTARIL) 10 MG tablet TAKE (1) TABLET TWICE A DAY AS NEEDED. 07/08/14   Olga Millers, MD  losartan (COZAAR) 50 MG tablet Take 50 mg by mouth daily. 05/06/14   Historical Provider, MD  Memantine HCl ER (NAMENDA XR) 28 MG CP24 Take 28 mg by mouth daily. 09/11/13   Marcial Pacas, MD  metoprolol tartrate (LOPRESSOR) 25 MG tablet TAKE 1/2 BY MOUTH EVERY MORNING (GENERIC FOR LOPRESSOR) 03/12/14   Darlin Coco, MD  nitroGLYCERIN (NITROSTAT) 0.4 MG SL tablet Place 1 tablet (0.4 mg total) under the tongue every 5 (five) minutes as needed for chest pain. 02/11/14   Darlin Coco, MD  pantoprazole (PROTONIX) 40 MG tablet Take 40 mg by mouth daily. 04/11/14   Historical Provider, MD  Polyethylene Glycol 3350 (GLYCOLAX PO) Take 1 packet by mouth daily as needed. For constipation    Historical Provider, MD  solifenacin (VESICARE) 5 MG tablet Take 1 tablet (5 mg total) by mouth daily. 10/12/13   Marcial Pacas, MD  triamcinolone (NASACORT AQ) 55 MCG/ACT AERO nasal inhaler Place 2 sprays into the nose daily. 07/09/14   Biagio Borg, MD   BP 110/52 mmHg  Temp(Src) 96 F (35.6 C) (Oral)  Resp 18  SpO2 99% Physical Exam  Constitutional: He appears well-developed and well-nourished.  HENT:  Head: Normocephalic and atraumatic.  Right Ear: External ear normal.  Left Ear:  External ear normal.  Nose: Nose normal.  Mouth/Throat: Oropharynx is clear and moist. No oropharyngeal exudate.  Eyes: Conjunctivae and EOM are normal. Pupils are equal, round, and reactive to light.  Neck: Normal range of motion. Neck supple.  Cardiovascular: Normal rate, regular rhythm, normal heart sounds and intact distal pulses.  Exam reveals no gallop and no friction rub.   No murmur heard. Pulmonary/Chest: Effort normal and breath sounds normal. No respiratory distress. He has no wheezes.  Abdominal: Soft. Bowel sounds are normal. He exhibits no distension. There is  no tenderness. There is no rebound and no guarding.  Genitourinary:  Normal-appearing external rectum. Normal rectal exam. Brown stool.  Musculoskeletal: Normal range of motion. He exhibits edema (mild to moderate pitting edema in the distal lower extremities extending up to the thighs bilaterally.). He exhibits no tenderness.  Normal appearance of the back.  Neurological: He is alert.  Skin: Skin is warm and dry.  Psychiatric: He has a normal mood and affect. His behavior is normal.  Nursing note and vitals reviewed.   ED Course  Procedures (including critical care time) Labs Review Labs Reviewed  BASIC METABOLIC PANEL - Abnormal; Notable for the following:    Glucose, Bld 134 (*)    Calcium 8.3 (*)    GFR calc non Af Amer 73 (*)    GFR calc Af Amer 85 (*)    All other components within normal limits  CBC - Abnormal; Notable for the following:    WBC 3.4 (*)    RBC 2.70 (*)    Hemoglobin 6.7 (*)    HCT 23.5 (*)    MCH 24.8 (*)    MCHC 28.5 (*)    RDW 17.7 (*)    All other components within normal limits  HEPATIC FUNCTION PANEL - Abnormal; Notable for the following:    Total Protein 5.5 (*)    Albumin 3.4 (*)    Total Bilirubin <0.1 (*)    All other components within normal limits  RETICULOCYTES - Abnormal; Notable for the following:    Retic Ct Pct 3.4 (*)    RBC. 2.70 (*)    All other components  within normal limits  BRAIN NATRIURETIC PEPTIDE - Abnormal; Notable for the following:    B Natriuretic Peptide 149.3 (*)    All other components within normal limits  POC OCCULT BLOOD, ED - Abnormal; Notable for the following:    Fecal Occult Bld POSITIVE (*)    All other components within normal limits  URINALYSIS, ROUTINE W REFLEX MICROSCOPIC  VITAMIN B12  FOLATE  IRON AND TIBC  FERRITIN  OCCULT BLOOD X 1 CARD TO LAB, STOOL  I-STAT TROPOININ, ED  TYPE AND SCREEN  ABO/RH  PREPARE RBC (CROSSMATCH)    Imaging Review Dg Chest 2 View  07/10/2014   CLINICAL DATA:  Cough, congestion, shortness of breath for 1 month.  EXAM: CHEST  2 VIEW  COMPARISON:  02/28/2014  FINDINGS: Cardiomegaly. Airspace opacity noted at the left lung base, increased since prior study. This could represent atelectasis or developing infiltrate. No effusions. No confluent opacity on the right. No acute bony abnormality.  IMPRESSION: Increasing opacity at the left lung base, atelectasis versus developing infiltrate/pneumonia.   Electronically Signed   By: Rolm Baptise M.D.   On: 07/10/2014 08:14     EKG Interpretation None      ED ECG REPORT   Date: 07/10/2014  Rate: 65  Rhythm: atrial fibrillation  QRS Axis: normal  Intervals: indeterminate  ST/T Wave abnormalities: normal  Conduction Disutrbances:none  Narrative Interpretation: No significant change from previous  Old EKG Reviewed: unchanged  CRITICAL CARE Performed by: Pamella Pert, S Total critical care time: 30 min Critical care time was exclusive of separately billable procedures and treating other patients. Critical care was necessary to treat or prevent imminent or life-threatening deterioration. Critical care was time spent personally by me on the following activities: development of treatment plan with patient and/or surrogate as well as nursing, discussions with consultants, evaluation of patient's response to treatment, examination  of  patient, obtaining history from patient or surrogate, ordering and performing treatments and interventions, ordering and review of laboratory studies, ordering and review of radiographic studies, pulse oximetry and re-evaluation of patient's condition.   MDM   Final diagnoses:  Anemia, unspecified anemia type  CAP (community acquired pneumonia)    1:02 PM 81 y.o. male with a history of atrial fibrillation, diabetes, dementia, chronic anemia (Fe def 2nd to bleeding) who presents with worsening cough productive of white sputum, and generalized weakness over the last 2 weeks. No fevers, shortness of breath, chest pain at home. Patient saw his PCP yesterday and had screening lab work and imaging done. Found to have left lower lobe pneumonia on follow-up of these tests today by his PCP as well as anemia. He was sent here for further evaluation. He has no current complaints on exam. We'll get screening labs and imaging.  Pt anemic w/ Hgb 6.7. Hemeoccult pos. 1U of blood ordered for administration. Will admit to hospitalist.     Pamella Pert, MD 07/10/14 1558

## 2014-07-10 NOTE — ED Notes (Signed)
Diagnosed yesterday with pneumonia by PCP, with a worsening anemia. Presenting with a worsening cough over the weekend. Today presents pale, O2 sats 91% but no complaints of SOB. Per EMS the patient was not given any antibiotics for home use.

## 2014-07-10 NOTE — Progress Notes (Signed)
Utilization Review completed.  Liridona Mashaw RN CM  

## 2014-07-10 NOTE — H&P (Signed)
[facility] History and Physical  [name] JME:[id number] DOB: October 24, 1927 DOA: 07/10/2014   PCP: Olga Millers, MD  Specialists: Followed by [name] for atrial fibrillation. Followed by urologist in [facility].  Chief Complaint: Weakness, cough, abnormal labs  HPI: [name] is a 81 y.o. male with a past medical history of dementia, atrial fibrillation not on anticoagulation, history of chronic GI bleeding, history of anemia requiring blood transfusions in the past who lives with his wife. He was apparently in his usual state of health about 2 weeks ago when he started developing a cough. Had clear expectoration. He went to his primary care physician's office yesterday for checkup and PCP suspected pneumonia. He ordered some blood work and a chest x-ray. Blood work revealed severe anemia. Patient was asked to come into the emergency department. It should be noted that the patient has dementia and most of the questions were answered by his wife and his son. There's been no history of shortness of breath, fever, nausea or vomiting. No pain. Patient denies any chest pain currently. No lightheadedness or dizziness. No syncopal episodes. He has been weak overall. Has found it difficult to ambulate using his walker as usual. However, his son feels that his strength appears to be closer to baseline today. Patient has had colonoscopies in the past which have revealed polyps. There is no history of any gross blood in the stool recently or any black-colored stool, although he does take iron on a regular basis. Once again history is limited due to his dementia.  Home Medications: Prior to Admission medications   Medication Sig Start Date End Date Taking? Authorizing Provider  aspirin 81 MG tablet Take 81 mg by mouth daily.   Yes Historical Provider, MD  cholecalciferol (VITAMIN D) 1000 UNITS tablet Take 1,000 Units by mouth daily.   Yes Historical Provider, MD  COD LIVER  OIL PO Take 1 tablet by mouth daily.    Yes Historical Provider, MD  donepezil (ARICEPT) 10 MG tablet Take 1 tablet (10 mg total) by mouth at bedtime. 12/28/13  Yes Olga Millers, MD  ferrous sulfate 325 (65 FE) MG tablet Take 325 mg by mouth 2 (two) times daily.    Yes Historical Provider, MD  fexofenadine (ALLEGRA) 180 MG tablet Take 1 tablet (180 mg total) by mouth daily. 07/09/14  Yes Biagio Borg, MD  folic acid (FOLVITE) 1 MG tablet Take 1 mg by mouth daily.   Yes Historical Provider, MD  furosemide (LASIX) 40 MG tablet Take 1 tablet (40 mg total) by mouth every morning. 05/23/14  Yes Olga Millers, MD  gabapentin (NEURONTIN) 100 MG capsule Take 100 mg by mouth at bedtime.   Yes Historical Provider, MD  glipiZIDE (GLUCOTROL) 5 MG tablet Take 5 mg by mouth daily. 04/21/14  Yes Historical Provider, MD  hydrOXYzine (ATARAX/VISTARIL) 10 MG tablet TAKE (1) TABLET TWICE A DAY AS NEEDED. Patient taking differently: Take 10 mg by mouth 2 (two) times daily as needed for itching or anxiety.  07/08/14  Yes Olga Millers, MD  losartan (COZAAR) 50 MG tablet Take 50 mg by mouth daily. 05/06/14  Yes Historical Provider, MD  Memantine HCl ER (NAMENDA XR) 28 MG CP24 Take 28 mg by mouth daily. 09/11/13  Yes Marcial Pacas, MD  metoprolol tartrate (LOPRESSOR) 25 MG tablet TAKE 1/2 BY MOUTH EVERY MORNING [facility] FOR LOPRESSOR) Patient taking differently: TAKE 1/2 BY MOUTH EVERY MORNING 03/12/14  Yes Darlin Coco, MD  nitroGLYCERIN (NITROSTAT) 0.4 MG SL tablet Place 1 tablet (0.4 mg total) under the tongue every 5 (five) minutes as needed for chest pain. 02/11/14  Yes Darlin Coco, MD  pantoprazole (PROTONIX) 40 MG tablet Take 40 mg by mouth daily. 04/11/14  Yes Historical Provider, MD  Polyethylene Glycol 3350 (GLYCOLAX PO) Take 1 packet by mouth daily as needed. For constipation   Yes Historical Provider, MD  solifenacin (VESICARE) 5 MG tablet Take 1 tablet (5 mg total) by mouth daily. 10/12/13  Yes  Marcial Pacas, MD  hydrocortisone 1 % ointment Apply 1 application topically 2 (two) times daily. 05/23/14   Olga Millers, MD  triamcinolone (NASACORT AQ) 55 MCG/ACT AERO nasal inhaler Place 2 sprays into the nose daily. 07/09/14   Biagio Borg, MD    Allergies:  Allergies  Allergen Reactions  . Cephalexin Other (See Comments)    Unknown reaction  . Furacin [Nitrofurazone] Other (See Comments)    Unknown reaction  . Macrodantin Other (See Comments)    Unknown reaction  . Zestril [Lisinopril] Other (See Comments)    Unknown reaction    Past Medical History: Past Medical History  Diagnosis Date  . Arrhythmia     a fib  . ED (erectile dysfunction)   . Memory disorder   . 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 Hematochezia     has intermittent problem  . Arthritis     DJD knee - left  . Restless leg     treats with heat  . Cancer     bladder cancer - [facility] [name]  . Diabetes mellitus     NIDDM on oral medication  . Genital warts     recurrent. Not a problem  . GERD (gastroesophageal reflux disease)     treated with PPI  . Hypertension     mild  . Neuropathy     burning discomfort feet/legs  . Irritable bladder     Past Surgical History  Procedure Laterality Date  . Cardiovascular stress test  03/28/2003    EF 55%.  Normal cardiolite study  . Appendectomy  1946  . Tonsillectomy  1938  . Joint replacement  1990's    left TKR  . Knee arthroscopy  1980's    right knee    Social History: Patient lives with his wife in [place]. Quit smoking 40 years ago. He consumes 1 drink of whiskey on a nightly basis. No illicit drug use. Uses a walker to ambulate. He has a physical therapist who comes home regularly.  Family History:  Family History  Problem Relation Age of Onset  . Kidney disease Mother   . Heart disease Mother   . Stroke Mother   . Cancer Father     prostate  . Diabetes Neg Hx   . COPD Neg Hx   . Mental illness Brother       Review of Systems - unable to do due to his dementia.  Physical Examination  Filed Vitals:   07/10/14 1221 07/10/14 1223 07/10/14 1326 07/10/14 1426  BP:  110/52 112/52 101/47  Pulse:   81 88  Temp:  96 F (35.6 C)    TempSrc:  Oral    Resp:  _0 SpO2: 94% 99% 99% 99%    BP 101/47 mmHg  Pulse 88  Temp(Src) 96 F (35.6 C) (Oral)  Resp 7  SpO2 99%  General appearance: alert, appears stated age, distracted  and no distress Head: Normocephalic, without obvious abnormality, atraumatic Eyes: conjunctivae/corneas clear. PERRL, EOM's intact.  Throat: lips, mucosa, and tongue normal; teeth and gums normal Neck: no adenopathy, no carotid bruit, no JVD, supple, symmetrical, trachea midline and thyroid not enlarged, symmetric, no tenderness/mass/nodules Resp: Crackles bilateral bases. No wheezing. No rhonchi. Cardio: regular rate and rhythm, S1, S2 normal, no murmur, click, rub or gallop GI: soft, non-tender; bowel sounds normal; no masses,  no organomegaly Extremities: pitting edema in bilateral lower extremities Pulses: 2+ and symmetric Skin: Skin color, texture, turgor normal. No rashes or lesions Lymph nodes: Cervical, supraclavicular, and axillary nodes normal. Neurologic: Alert. Distracted. No obvious cranial nerve abnormalities. No obvious focal neurological deficits. Oriented to person.  Laboratory Data: Results for orders placed or performed during the hospital encounter of 07/10/14 (from the past 48 hour(s))  Urinalysis, Routine w reflex microscopic     Status: None   Collection Time: 07/10/14 12:09 PM  Result Value Ref Range   Color, Urine YELLOW YELLOW   APPearance CLEAR CLEAR   Specific Gravity, Urine 1.010 1.005 - 1.030   pH 7.5 5.0 - 8.0   Glucose, UA NEGATIVE NEGATIVE mg/dL   Hgb urine dipstick NEGATIVE NEGATIVE   Bilirubin Urine NEGATIVE NEGATIVE   Ketones, ur NEGATIVE NEGATIVE mg/dL   Protein, ur NEGATIVE NEGATIVE mg/dL   Urobilinogen, UA 0.2 0.0 - 1.0  mg/dL   Nitrite NEGATIVE NEGATIVE   Leukocytes, UA NEGATIVE NEGATIVE    Comment: MICROSCOPIC NOT DONE ON URINES WITH NEGATIVE PROTEIN, BLOOD, LEUKOCYTES, NITRITE, OR GLUCOSE <1000 mg/dL.  Basic metabolic panel    (if pt has PMH of COPD)     Status: Abnormal   Collection Time: 07/10/14 12:43 PM  Result Value Ref Range   Sodium 139 135 - 145 mmol/L   Potassium 3.9 3.5 - 5.1 mmol/L   Chloride 104 96 - 112 mmol/L   CO2 28 19 - 32 mmol/L   Glucose, Bld 134 (H) 70 - 99 mg/dL   BUN 10 6 - 23 mg/dL   Creatinine, Ser 0.97 0.50 - 1.35 mg/dL   Calcium 8.3 (L) 8.4 - 10.5 mg/dL   GFR calc non Af Amer 73 (L) >90 mL/min   GFR calc Af Amer 85 (L) >90 mL/min    Comment: (NOTE) The eGFR has been calculated using the CKD EPI equation. This calculation has not been validated in all clinical situations. eGFR's persistently <90 mL/min signify possible Chronic Kidney Disease.    Anion gap 7 5 - 15  CBC     (if pt has PMH of COPD)     Status: Abnormal   Collection Time: 07/10/14 12:43 PM  Result Value Ref Range   WBC 3.4 (L) 4.0 - 10.5 K/uL   RBC 2.70 (L) 4.22 - 5.81 MIL/uL   Hemoglobin 6.7 (LL) 13.0 - 17.0 g/dL    Comment: REPEATED TO VERIFY CRITICAL RESULT CALLED TO, READ BACK BY AND VERIFIED WITH: MARTHA HAMBY,RN [id number] @ 1342 BY J SCOTTON    HCT 23.5 (L) 39.0 - 52.0 %   MCV 87.0 78.0 - 100.0 fL   MCH 24.8 (L) 26.0 - 34.0 pg   MCHC 28.5 (L) 30.0 - 36.0 g/dL   RDW 17.7 (H) 11.5 - 15.5 %   Platelets 159 150 - 400 K/uL  Hepatic function panel     Status: Abnormal   Collection Time: 07/10/14 12:43 PM  Result Value Ref Range   Total Protein 5.5 (L) 6.0 - 8.3 g/dL  Albumin 3.4 (L) 3.5 - 5.2 g/dL   AST 18 0 - 37 U/L   ALT 12 0 - 53 U/L   Alkaline Phosphatase 68 39 - 117 U/L   Total Bilirubin <0.1 (L) 0.3 - 1.2 mg/dL   Bilirubin, Direct 0.1 0.0 - 0.5 mg/dL   Indirect Bilirubin NOT CALCULATED 0.3 - 0.9 mg/dL  Reticulocytes     Status: Abnormal   Collection Time: 07/10/14 12:43 PM  Result  Value Ref Range   Retic Ct Pct 3.4 (H) 0.4 - 3.1 %   RBC. 2.70 (L) 4.22 - 5.81 MIL/uL   Retic Count, Manual 91.8 19.0 - 186.0 K/uL  I-stat troponin, ED     Status: None   Collection Time: 07/10/14  1:08 PM  Result Value Ref Range   Troponin i, poc 0.01 0.00 - 0.08 ng/mL   Comment 3            Comment: Due to the release kinetics of cTnI, a negative result within the first hours of the onset of symptoms does not rule out myocardial infarction with certainty. If myocardial infarction is still suspected, repeat the test at appropriate intervals.   Type and screen     Status: None   Collection Time: 07/10/14  1:08 PM  Result Value Ref Range   ABO/RH(D) A POS    Antibody Screen NEG    Sample Expiration 07/13/2014   ABO/Rh     Status: None   Collection Time: 07/10/14  1:08 PM  Result Value Ref Range   ABO/RH(D) A POS   POC occult blood, ED     Status: Abnormal   Collection Time: 07/10/14  1:10 PM  Result Value Ref Range   Fecal Occult Bld POSITIVE (A) NEGATIVE  Brain natriuretic peptide     Status: Abnormal   Collection Time: 07/10/14  1:18 PM  Result Value Ref Range   B Natriuretic Peptide 149.3 (H) 0.0 - 100.0 pg/mL    Radiology Reports: Dg Chest 2 View  07/10/2014   CLINICAL DATA:  Cough, congestion, shortness of breath for 1 month.  EXAM: CHEST  2 VIEW  COMPARISON:  02/28/2014  FINDINGS: Cardiomegaly. Airspace opacity noted at the left lung base, increased since prior study. This could represent atelectasis or developing infiltrate. No effusions. No confluent opacity on the right. No acute bony abnormality.  IMPRESSION: Increasing opacity at the left lung base, atelectasis versus developing infiltrate/pneumonia.   Electronically Signed   By: Rolm Baptise M.D.   On: 07/10/2014 08:14    Electrocardiogram: Regular rhythm at 65 beats a minute. Appears to be atrial fibrillation. Normal axis. Intervals are normal. No concerning ST or T-wave changes.  Problem List  Principal  Problem:   CAP (community acquired pneumonia) Active Problems:   Atrial fibrillation   Iron deficiency anemia due to chronic blood loss   Diabetes mellitus   Dementia   Benign hypertensive heart disease without heart failure   Chronic venous insufficiency   Assessment: This is a 81 year old Caucasian male who comes in with weakness, cough, ongoing for 2 weeks and is noted to have severe anemia and x-ray findings suggestive of pneumonia. This is community-acquired pneumonia. His anemia is likely due to chronic GI blood loss. Stool is positive for occult blood.  Plan: #1 Community-acquired pneumonia: He'll be treated with Levaquin. Urine for strep and legionella antigens. Oxygen as needed.  #2 severe anemia, normocytic: Apparently has a history of iron deficiency anemia on iron supplements at home. Anemia panel  from 2015 reviewed. He has chronic GI blood loss. No overt bleeding has been identified. He did have brown stool today on rectal exam per the ED physician and it was positive for occult blood. We will transfuse him 2 units of blood. Lasix will be given. Further management can be pursued as an outpatient.  #3 history of chronic GI blood loss with history of polyps: Wife does not remember when was his last colonoscopy. His gastroenterologist is Dr. Amedeo Plenty. He does not have any overt blood loss currently. This can be followed up as an outpatient.  #4 history of atrial fibrillation, not on anticoagulation due to chronic GI loss: Heart rate is stable. Monitor on telemetry. Continue with his home medications.  #5 history of diabetes mellitus type 2: Sliding scale insulin coverage. Monitor CBGs closely.  #6 history of dementia: Stable. Continue home medications.  #7 Chronic lower extremity edema: Per wife this is ongoing for a long period of time. According to her the legs actually look much better. Continue with home dose of Lasix.  #8 History of essential hypertension: Monitor blood  pressures closely. Hold his ARB for now.   DVT Prophylaxis: SCDs Code Status: Full code Family Communication: Discussed with the patient's wife and son  Disposition Plan: Admit to telemetry   Further management decisions will depend on results of further testing and patient's response to treatment.   [facility]  [facility] Pager [phone]  If 7PM-7AM, please contact night-coverage [email or web address] Password [facility]  07/10/2014, 3:14 PM

## 2014-07-10 NOTE — ED Notes (Signed)
Nurse stated she will get the labs.

## 2014-07-11 DIAGNOSIS — D509 Iron deficiency anemia, unspecified: Secondary | ICD-10-CM | POA: Insufficient documentation

## 2014-07-11 DIAGNOSIS — D649 Anemia, unspecified: Secondary | ICD-10-CM

## 2014-07-11 LAB — CBC
HCT: 28.1 % — ABNORMAL LOW (ref 39.0–52.0)
Hemoglobin: 8.1 g/dL — ABNORMAL LOW (ref 13.0–17.0)
MCH: 24.9 pg — ABNORMAL LOW (ref 26.0–34.0)
MCHC: 28.8 g/dL — ABNORMAL LOW (ref 30.0–36.0)
MCV: 86.5 fL (ref 78.0–100.0)
Platelets: 139 10*3/uL — ABNORMAL LOW (ref 150–400)
RBC: 3.25 MIL/uL — ABNORMAL LOW (ref 4.22–5.81)
RDW: 17.2 % — AB (ref 11.5–15.5)
WBC: 3.8 10*3/uL — AB (ref 4.0–10.5)

## 2014-07-11 LAB — LEGIONELLA ANTIGEN, URINE

## 2014-07-11 LAB — GLUCOSE, CAPILLARY
GLUCOSE-CAPILLARY: 114 mg/dL — AB (ref 70–99)
Glucose-Capillary: 89 mg/dL (ref 70–99)
Glucose-Capillary: 101 mg/dL — ABNORMAL HIGH (ref 70–99)
Glucose-Capillary: 107 mg/dL — ABNORMAL HIGH (ref 70–99)

## 2014-07-11 LAB — COMPREHENSIVE METABOLIC PANEL
ALT: 13 U/L (ref 0–53)
AST: 16 U/L (ref 0–37)
Albumin: 3.3 g/dL — ABNORMAL LOW (ref 3.5–5.2)
Alkaline Phosphatase: 74 U/L (ref 39–117)
Anion gap: 6 (ref 5–15)
BUN: 9 mg/dL (ref 6–23)
CALCIUM: 8.2 mg/dL — AB (ref 8.4–10.5)
CO2: 30 mmol/L (ref 19–32)
Chloride: 104 mmol/L (ref 96–112)
Creatinine, Ser: 1.01 mg/dL (ref 0.50–1.35)
GFR calc Af Amer: 76 mL/min — ABNORMAL LOW (ref >90)
GFR, EST NON AFRICAN AMERICAN: 66 mL/min — AB (ref >90)
Glucose, Bld: 95 mg/dL (ref 70–99)
POTASSIUM: 3.6 mmol/L (ref 3.5–5.1)
SODIUM: 140 mmol/L (ref 135–145)
TOTAL PROTEIN: 5.6 g/dL — AB (ref 6.0–8.3)
Total Bilirubin: 1.1 mg/dL (ref 0.3–1.2)

## 2014-07-11 LAB — HIV ANTIBODY (ROUTINE TESTING W REFLEX): HIV Screen 4th Generation wRfx: NONREACTIVE

## 2014-07-11 NOTE — Progress Notes (Signed)
[facility]  Patient Status: Active (receiving services up to time of hospitalization)  [facility] is providing the following services: RN, PT, OT and MSW  If patient discharges after hours, please call [phone].   Lurlean Leyden 07/11/2014, 10:41 AM

## 2014-07-11 NOTE — Progress Notes (Signed)
PROGRESS NOTE  [name] EQA:[id number] DOB: [birth year] DOA: 07/10/2014 PCP: Olga Millers, MD  HPI: [name] is a 81 y.o. male with a past medical history of dementia, atrial fibrillation not on anticoagulation, history of chronic GI bleeding, history of anemia requiring blood transfusions in the past who lives with his wife. He was apparently in his usual state of health about 2 weeks ago when he started developing a cough. Had clear expectoration. He went to his primary care physician's office yesterday for checkup and PCP suspected pneumonia.  Subjective / 24 H Interval events - confused, does not have any complaints, denies pain or breathing difficulties - doesn't know why he is in the hospital   Assessment/Plan: Principal Problem:   CAP (community acquired pneumonia) Active Problems:   Atrial fibrillation   Iron deficiency anemia due to chronic blood loss   Diabetes mellitus   Dementia   Benign hypertensive heart disease without heart failure   Chronic venous insufficiency   CAP  - stable respiratory status this morning, continue Levofloxacin - wean off oxygen as tolerated  Normocytic anemia in the setting of chronic GI bleeding - history of iron deficiency, chronic GI bleeding - no overt bleeding, brown stool in the ED, FOBT positive - outpatient GI evaluation  - s/p 2U pRBC, improved appropriately, repeat CBC in am, if significant drop may need inpatient GI evaluation - s/p Lasix IV last night, back on po Lasix today   A fib not on anticoagulation  - rate controlled  Dementia  - will discuss with family regarding baseline.  Chronic LE edema  HTN - stable BP currently. Resume ARb tomorrow.  DM  - A1C shows good control, CBGs good here as well    Diet: Diet Carb Modified Fluid consistency:: Thin; Room service appropriate?: Yes Fluids: none DVT Prophylaxis: SCD  Code Status: Full Code Family Communication: none bedside  Disposition Plan:  home when ready, wean off oxygen, PT evaluation  Consultants:  None   Procedures:  None    Antibiotics Levaquin 4/20 >>   Studies  Dg Chest 2 View  07/10/2014   CLINICAL DATA:  Cough, congestion, shortness of breath for 1 month.  EXAM: CHEST  2 VIEW  COMPARISON:  02/28/2014  FINDINGS: Cardiomegaly. Airspace opacity noted at the left lung base, increased since prior study. This could represent atelectasis or developing infiltrate. No effusions. No confluent opacity on the right. No acute bony abnormality.  IMPRESSION: Increasing opacity at the left lung base, atelectasis versus developing infiltrate/pneumonia.   Electronically Signed   By: Rolm Baptise M.D.   On: 07/10/2014 08:14    Objective  Filed Vitals:   07/11/14 0110 07/11/14 0309 07/11/14 0500 07/11/14 0510  BP: 129/49 138/62  145/52  Pulse: 78 77  119  Temp: 98.5 F (36.9 C) 98.2 F (36.8 C)  98.7 F (37.1 C)  TempSrc: Oral Oral  Oral  Resp: 20 18  20   Weight:   112.2 kg (247 lb 5.7 oz)   SpO2: 99% 97%  98%    Intake/Output Summary (Last 24 hours) at 07/11/14 1403 Last data filed at 07/11/14 1326  Gross per 24 hour  Intake   1415 ml  Output   2775 ml  Net  -1360 ml   Filed Weights   07/11/14 0500  Weight: 112.2 kg (247 lb 5.7 oz)    Exam:  General:  NAD, confused  HEENT: no scleral icterus, PERRL  Cardiovascular: RRR without MRG, 2+  peripheral pulses, 1+ edema LE  Respiratory: good air movement, no wheezing, faint crackles  Abdomen: soft, non tender, BS +, no guarding  MSK/Extremities: no clubbing/cyanosis, no joint swelling  Skin: no rashes  Neuro: non focal  Data Reviewed: Basic Metabolic Panel:  Recent Labs Lab 07/09/14 1641 07/10/14 1243 07/11/14 0615  NA 136 139 140  K 4.2 3.9 3.6  CL 101 104 104  CO2 31 28 30   GLUCOSE 108* 134* 95  BUN 9 10 9   CREATININE 0.99 0.97 1.01  CALCIUM 8.9 8.3* 8.2*   Liver Function Tests:  Recent Labs Lab 07/09/14 1641 07/10/14 1243  07/11/14 0615  AST 14 18 16   ALT 10 12 13   ALKPHOS 80 68 74  BILITOT 0.3 <0.1* 1.1  PROT 6.0 5.5* 5.6*  ALBUMIN 3.7 3.4* 3.3*   No results for input(s): LIPASE, AMYLASE in the last 168 hours. No results for input(s): AMMONIA in the last 168 hours. CBC:  Recent Labs Lab 07/09/14 1641 07/10/14 1243 07/11/14 0615  WBC 5.2 3.4* 3.8*  NEUTROABS 3.9  --   --   HGB 7.1 Repeated and verified X2.* 6.7* 8.1*  HCT 23.0* 23.5* 28.1*  MCV 81.4 87.0 86.5  PLT 194.0 159 139*   Cardiac Enzymes: No results for input(s): CKTOTAL, CKMB, CKMBINDEX, TROPONINI in the last 168 hours. BNP (last 3 results)  Recent Labs  07/10/14 1318  BNP 149.3*    ProBNP (last 3 results)  Recent Labs  12/17/13 1710  PROBNP 573.8*    CBG:  Recent Labs Lab 07/10/14 1800 07/10/14 2109 07/11/14 0726 07/11/14 1105  GLUCAP 100* 109* 89 101*    No results found for this or any previous visit (from the past 240 hour(s)).   Scheduled Meds: . sodium chloride   Intravenous Once  . darifenacin  7.5 mg Oral Daily  . docusate sodium  100 mg Oral BID  . donepezil  10 mg Oral QHS  . ferrous sulfate  325 mg Oral BID  . folic acid  1 mg Oral Daily  . furosemide  40 mg Oral q morning - 10a  . gabapentin  100 mg Oral QHS  . insulin aspart  0-15 Units Subcutaneous TID WC  . levofloxacin (LEVAQUIN) IV  750 mg Intravenous Q24H  . loratadine  10 mg Oral Daily  . memantine  28 mg Oral Daily  . metoprolol tartrate  12.5 mg Oral Daily  . pantoprazole  40 mg Oral Daily  . sodium chloride  3 mL Intravenous Q12H  . sodium chloride  3 mL Intravenous Q12H  . thiamine  100 mg Oral Daily   Continuous Infusions:   Marzetta Board, MD [facility] Pager [phone]. If 7 PM - 7 AM, please contact night-coverage at [email or web address], password [facility] 07/11/2014, 2:03 PM  LOS: 1 day

## 2014-07-11 NOTE — Progress Notes (Signed)
CARE MANAGEMENT NOTE 07/11/2014  Patient:  [name], [name]   Account Number:  [id number]  Date Initiated:  07/11/2014  Documentation initiated by:  Edwyna Shell  Subjective/Objective Assessment:   81 yo male admitted with CAP from home     Action/Plan:   discharge planning   Anticipated DC Date:  07/13/2014   Anticipated DC Plan:  [place]  CM consult      Choice offered to / List presented to:             Status of service:  In process, will continue to follow Medicare Important Message given?   (If response is "NO", the following Medicare IM given date fields will be blank) Date Medicare IM given:   Medicare IM given by:   Date Additional Medicare IM given:   Additional Medicare IM given by:    Discharge Disposition:  [place]  Per UR Regulation:  Reviewed for med. necessity/level of care/duration of stay  If discussed at [place] of Stay Meetings, dates discussed:    Comments:  07/11/14 Charise Killian RN BSN CM [phone] Spoke with patient and spouse. Spouse is in an electric wheelchair and patient ambulated with a walker in the home but the patient has recently gotten weaker requiring more assistance at times. They stated that they have private duty sitters through [employer] 7 days a week. In addition the patient was receiving [place] [facility] from [facility] prior to hospitalization. They state that the plan is to return home with sitters and [facility] PT, RN, and SW until they decide which ALF they want to move into. The spouse stated that they have recently started looking at ALF's to move into for increase support and care. They also have a BSC, and shower chair. Will continue to follow for dc needs.

## 2014-07-11 NOTE — Evaluation (Signed)
Physical Therapy Evaluation Patient Details Name: [name] MRN: [id number] DOB: 19-Mar-1928 Today's Date: 07/11/2014   History of Present Illness  pt was dmitted for weakness, cough and abnormal labs.  He was dx'd with CAP.  Pt has a h/o dementia, A Fib, RLS, neuropathy, DJD L knee, bladder CA, and DM  Clinical Impression  Patient ambulated  Better than anticipated. Does require 1 person assist for mobility/ambulation. Wife present  States plans to return home with [facility]  As PTA. Patient will benefit from PT to address problems listed in note below.    Follow Up Recommendations Home health PT;Supervision/Assistance - 24 hour    Equipment Recommendations  None recommended by PT    Recommendations for Other Services       Precautions / Restrictions Precautions Precautions: Fall Restrictions Weight Bearing Restrictions: No      Mobility  Bed Mobility Overal bed mobility: Needs Assistance Bed Mobility: Supine to Sit;Sit to Supine     Supine to sit: Min assist;HOB elevated Sit to supine: Min assist   General bed mobility comments: assist for trunk, assist legs onto bed.  Transfers Overall transfer level: Needs assistance Equipment used: Rolling walker (2 wheeled) Transfers: Sit to/from Stand Sit to Stand: Min assist         General transfer comment: cues for safety, reach back to bed, decreased control of descent,  Ambulation/Gait Ambulation/Gait assistance: Min assist Ambulation Distance (Feet): 130 Feet Assistive device: Rolling walker (2 wheeled) Gait Pattern/deviations: Step-through pattern;Shuffle;Decreased stride length;Decreased weight shift to right;Decreased weight shift to left     General Gait Details: patient ambulated better than anticipated.  no jerking noted during ambulation.  Stairs            Wheelchair Mobility    Modified Rankin (Stroke Patients Only)       Balance Overall balance assessment: Needs  assistance Sitting-balance support: Feet supported;Bilateral upper extremity supported Sitting balance-Leahy Scale: Fair     Standing balance support: During functional activity;Bilateral upper extremity supported Standing balance-Leahy Scale: Poor                               Pertinent Vitals/Pain Pain Assessment: No/denies pain    Home Living Family/patient expects to be discharged to:: Private residence Living Arrangements: Spouse/significant other Available Help at Discharge: Personal care attendant;Family Type of Home: House Home Access: Level entry     Home Layout: One level Home Equipment: [place] - 2 wheels;Shower seat;Bedside commode Additional Comments: wife in room in a power chair.  They have 8 hours of aides assistance 7 days a week.  Son comes over to help father shower/dress.  He gets to baathroom on his own with RW.  Had [place] working with him    Prior Function Level of Independence: Needs assistance               Hand Dominance        Extremity/Trunk Assessment   Upper Extremity Assessment: Generalized weakness           Lower Extremity Assessment: Generalized weakness;RLE deficits/detail;LLE deficits/detail RLE Deficits / Details: mild jerking noted LLE Deficits / Details: mild jerking noted  Cervical / Trunk Assessment: Other exceptions  Communication   Communication: No difficulties  Cognition Arousal/Alertness: Awake/alert Behavior During Therapy: WFL for tasks assessed/performed Overall Cognitive Status: History of cognitive impairments - at baseline  General Comments      Exercises        Assessment/Plan    PT Assessment Patient needs continued PT services  PT Diagnosis Difficulty walking;Generalized weakness   PT Problem List Decreased strength;Decreased activity tolerance;Decreased balance;Decreased mobility;Decreased knowledge of precautions;Decreased safety awareness;Decreased  knowledge of use of DME;Decreased cognition  PT Treatment Interventions DME instruction;Gait training;Functional mobility training;Therapeutic activities;Patient/family education;Balance training   PT Goals (Current goals can be found in the Care Plan section) Acute Rehab PT Goals Patient Stated Goal: return home with existing supports while they look for ALF PT Goal Formulation: With patient/family Time For Goal Achievement: 07/25/14 Potential to Achieve Goals: Good    Frequency Min 3X/week   Barriers to discharge        Co-evaluation               End of Session Equipment Utilized During Treatment: Gait belt Activity Tolerance: Patient tolerated treatment well Patient left: in bed;with call bell/phone within reach;with bed alarm set;with family/visitor present Nurse Communication: Mobility status (jerking of trunk and limbs)         Time: 1535-1550 PT Time Calculation (min) (ACUTE ONLY): 15 min   Charges:   PT Evaluation $Initial PT Evaluation Tier I: 1 Procedure     PT G CodesClaretha Cooper 07/11/2014, 4:34 PM Tresa Endo PT [phone]

## 2014-07-11 NOTE — Progress Notes (Signed)
Pt. Tolerated blood transfusions X 2 no s/sx of blood transfusion noted.

## 2014-07-11 NOTE — Evaluation (Signed)
Occupational Therapy Evaluation Patient Details Name: [name] MRN: [id number] DOB: 11/14/1927 Today's Date: 07/11/2014    History of Present Illness pt was admitted for weakness, cough and abnormal labs.  He was dx'd with CAP.  Pt has a h/o dementia, A Fib, RLS, neuropathy, DJD L knee, bladder CA, and DM   Clinical Impression   This 81 year old man was admitted for the above.  At baseline, he has assistance for ADLs but ambulates to bathroom and uses commode on his own with RW.   Pt currently needs min A for bed mobility and sit to stand:  Did not perform transfer to commode during evaluation.  Goals are set for supervision level.    Follow Up Recommendations  Home health OT (as long as pt reaches supervision level for bed mobility and SPT to [facility] or pt/wife have additional help, if he doesn't reach this--otherwise may need SNF)    Equipment Recommendations  None recommended by OT    Recommendations for Other Services       Precautions / Restrictions Precautions Precautions: Fall Restrictions Weight Bearing Restrictions: No      Mobility Bed Mobility Overal bed mobility: Needs Assistance Bed Mobility: Supine to Sit     Supine to sit: Min assist     General bed mobility comments: assist for trunk  Transfers Overall transfer level: Needs assistance Equipment used:  (therapist stood in front of pt) Transfers: Sit to/from Stand Sit to Stand: Min assist         General transfer comment: and min A for lateral steps to [place] Overall balance assessment: Needs assistance Sitting-balance support: Feet supported Sitting balance-Leahy Scale:  (at least fair)     Standing balance support: Bilateral upper extremity supported Standing balance-Leahy Scale: Poor                              ADL Overall ADL's : Needs assistance/impaired                                       General ADL Comments: Pt has assist for ADLs from  son. At baseline, he ambulates to bathroom with RW at supervision/mod I  level.  Pt fatiqued after sitting at EOB for several minutes.  He was able to stand with min A from elevated surface and sidestep at this level.       Vision     Perception     Praxis      Pertinent Vitals/Pain Pain Assessment: No/denies pain     Hand Dominance     Extremity/Trunk Assessment Upper Extremity Assessment Upper Extremity Assessment: Generalized weakness           Communication Communication Communication: No difficulties   Cognition Arousal/Alertness: Awake/alert Behavior During Therapy: WFL for tasks assessed/performed Overall Cognitive Status: History of cognitive impairments - at baseline                     General Comments       Exercises       Shoulder Instructions      Home Living Family/patient expects to be discharged to:: Private residence Living Arrangements: Spouse/significant other Available Help at Discharge: Personal care attendant (7 days a week x 8 hours)  Bathroom Shower/Tub: Occupational psychologist: Handicapped height     Home Equipment: Environmental consultant - 2 wheels;Shower seat;Bedside commode   Additional Comments: wife in room in a power chair.  They have 8 hours of aides assistance 7 days a week.  Son comes over to help father shower/dress.  He gets to baathroom on his own with RW.  Had [place] working with him      Prior Functioning/Environment Level of Independence: Needs assistance             OT Diagnosis: Generalized weakness;Cognitive deficits   OT Problem List: Decreased strength;Decreased activity tolerance;Pain;Decreased knowledge of use of DME or AE;Impaired balance (sitting and/or standing);Decreased cognition;Decreased safety awareness   OT Treatment/Interventions: Self-care/ADL training;DME and/or AE instruction;Balance training;Patient/family education;Therapeutic activities;Cognitive remediation/compensation     OT Goals(Current goals can be found in the care plan section) Acute Rehab OT Goals Patient Stated Goal: return home with existing supports while they look for ALF OT Goal Formulation: With patient Time For Goal Achievement: 07/18/14 Potential to Achieve Goals: Good ADL Goals Pt Will Transfer to Toilet: with supervision;bedside commode;stand pivot transfer Pt Will Perform Toileting - Clothing Manipulation and hygiene: with supervision;sit to/from stand Additional ADL Goal #1: Pt will perform bed mobility at supervision level in preparation for toilet transfers  OT Frequency: Min 2X/week   Barriers to D/C:            Co-evaluation              End of Session    Activity Tolerance: Patient limited by fatigue Patient left: in bed;with call bell/phone within reach   Time: 1426-1443 OT Time Calculation (min): 17 min Charges:  OT General Charges $OT Visit: 1 Procedure OT Evaluation $Initial OT Evaluation Tier I: 1 Procedure G-Codes:    Raudel Bazen 07-28-14, 3:21 PM  Lesle [name], OTR/L [phone] Jul 28, 2014

## 2014-07-12 LAB — CBC
HCT: 29.5 % — ABNORMAL LOW (ref 39.0–52.0)
Hemoglobin: 8.5 g/dL — ABNORMAL LOW (ref 13.0–17.0)
MCH: 25 pg — ABNORMAL LOW (ref 26.0–34.0)
MCHC: 28.8 g/dL — AB (ref 30.0–36.0)
MCV: 86.8 fL (ref 78.0–100.0)
PLATELETS: 159 10*3/uL (ref 150–400)
RBC: 3.4 MIL/uL — ABNORMAL LOW (ref 4.22–5.81)
RDW: 17.2 % — ABNORMAL HIGH (ref 11.5–15.5)
WBC: 5.2 10*3/uL (ref 4.0–10.5)

## 2014-07-12 LAB — GLUCOSE, CAPILLARY
GLUCOSE-CAPILLARY: 96 mg/dL (ref 70–99)
Glucose-Capillary: 103 mg/dL — ABNORMAL HIGH (ref 70–99)
Glucose-Capillary: 132 mg/dL — ABNORMAL HIGH (ref 70–99)
Glucose-Capillary: 170 mg/dL — ABNORMAL HIGH (ref 70–99)

## 2014-07-12 MED ORDER — LOSARTAN POTASSIUM 50 MG PO TABS
50.0000 mg | ORAL_TABLET | Freq: Every day | ORAL | Status: DC
  Administered 2014-07-13: 50 mg via ORAL
  Filled 2014-07-12: qty 1

## 2014-07-12 NOTE — Progress Notes (Signed)
PROGRESS NOTE  [name] DGL:[id number] DOB: 1928-01-04 DOA: 07/10/2014 PCP: Eduardo Millers, MD  HPI: [name] is a 81 y.o. male with a past medical history of dementia, atrial fibrillation not on anticoagulation, history of chronic GI bleeding, history of anemia requiring blood transfusions in the past who lives with his wife. He was apparently in his usual state of health about 2 weeks ago when he started developing a cough. Had clear expectoration. He went to his primary care physician's office yesterday for checkup and PCP suspected pneumonia.  Subjective / 24 H Interval events - underlying dementia, denies shortness of breath/chest pain - worked well with PT yesterday   Assessment/Plan: Principal Problem:   CAP (community acquired pneumonia) Active Problems:   Atrial fibrillation   Iron deficiency anemia due to chronic blood loss   Diabetes mellitus   Dementia   Benign hypertensive heart disease without heart failure   Chronic venous insufficiency   Absolute anemia   CAP  - stable respiratory status this morning, continue Levofloxacin - wean off oxygen as tolerated, still on [place] this morning. Have asked RN to wean him off  Normocytic anemia in the setting of chronic GI bleeding - history of iron deficiency, chronic GI bleeding - no overt bleeding, brown stool in the ED, FOBT positive - outpatient GI evaluation  - s/p 2U pRBC, improved appropriately, repeat CBC today  A fib not on anticoagulation  - rate controlled  Dementia  - at baseline per wife [name]  Chronic LE edema  HTN - stable BP currently. Resume ARb.  DM  - A1C shows good control, CBGs good here as well    Diet: Diet Carb Modified Fluid consistency:: Thin; Room service appropriate?: Yes Fluids: none DVT Prophylaxis: SCD  Code Status: Full Code Family Communication: d/w wife [name] over the phone  Disposition Plan: home when ready, wean off oxygen  Consultants:  None    Procedures:  None    Antibiotics Levaquin 4/20 >>   Studies  No results found.  Objective  Filed Vitals:   07/11/14 0510 07/11/14 1300 07/11/14 2113 07/12/14 0600  BP: 145/52 126/65 142/63 143/69  Pulse: 119 62 74 87  Temp: 98.7 F (37.1 C) 98.4 F (36.9 C) 98.2 F (36.8 C) 98.8 F (37.1 C)  TempSrc: Oral Oral Oral Oral  Resp: 20 20 20 18   Weight:    111.2 kg (245 lb 2.4 oz)  SpO2: 98% 96% 97% 99%    Intake/Output Summary (Last 24 hours) at 07/12/14 1300 Last data filed at 07/12/14 1044  Gross per 24 hour  Intake    630 ml  Output   1075 ml  Net   -445 ml   Filed Weights   07/11/14 0500 07/12/14 0600  Weight: 112.2 kg (247 lb 5.7 oz) 111.2 kg (245 lb 2.4 oz)    Exam:  General:  NAD, confused  HEENT: no scleral icterus, PERRL  Cardiovascular: RRR without MRG, 2+ peripheral pulses, 1+ edema LE  Respiratory: good air movement, no wheezing, faint crackles  Abdomen: soft, non tender, BS +, no guarding  MSK/Extremities: no clubbing/cyanosis, no joint swelling  Skin: no rashes  Neuro: non focal  Data Reviewed: Basic Metabolic Panel:  Recent Labs Lab 07/09/14 1641 07/10/14 1243 07/11/14 0615  NA 136 139 140  K 4.2 3.9 3.6  CL 101 104 104  CO2 31 28 30   GLUCOSE 108* 134* 95  BUN 9 10 9   CREATININE 0.99 0.97 1.01  CALCIUM 8.9 8.3* 8.2*   Liver Function Tests:  Recent Labs Lab 07/09/14 1641 07/10/14 1243 07/11/14 0615  AST 14 18 16   ALT 10 12 13   ALKPHOS 80 68 74  BILITOT 0.3 <0.1* 1.1  PROT 6.0 5.5* 5.6*  ALBUMIN 3.7 3.4* 3.3*   No results for input(s): LIPASE, AMYLASE in the last 168 hours. No results for input(s): AMMONIA in the last 168 hours. CBC:  Recent Labs Lab 07/09/14 1641 07/10/14 1243 07/11/14 0615  WBC 5.2 3.4* 3.8*  NEUTROABS 3.9  --   --   HGB 7.1 Repeated and verified X2.* 6.7* 8.1*  HCT 23.0* 23.5* 28.1*  MCV 81.4 87.0 86.5  PLT 194.0 159 139*   Cardiac Enzymes: No results for input(s): CKTOTAL,  CKMB, CKMBINDEX, TROPONINI in the last 168 hours. BNP (last 3 results)  Recent Labs  07/10/14 1318  BNP 149.3*    ProBNP (last 3 results)  Recent Labs  12/17/13 1710  PROBNP 573.8*    CBG:  Recent Labs Lab 07/11/14 1105 07/11/14 1747 07/11/14 2135 07/12/14 0807 07/12/14 1152  GLUCAP 101* 107* 114* 96 103*    No results found for this or any previous visit (from the past 240 hour(s)).   Scheduled Meds: . sodium chloride   Intravenous Once  . darifenacin  7.5 mg Oral Daily  . docusate sodium  100 mg Oral BID  . donepezil  10 mg Oral QHS  . ferrous sulfate  325 mg Oral BID  . folic acid  1 mg Oral Daily  . furosemide  40 mg Oral q morning - 10a  . gabapentin  100 mg Oral QHS  . insulin aspart  0-15 Units Subcutaneous TID WC  . levofloxacin (LEVAQUIN) IV  750 mg Intravenous Q24H  . loratadine  10 mg Oral Daily  . memantine  28 mg Oral Daily  . metoprolol tartrate  12.5 mg Oral Daily  . pantoprazole  40 mg Oral Daily  . sodium chloride  3 mL Intravenous Q12H  . sodium chloride  3 mL Intravenous Q12H  . thiamine  100 mg Oral Daily   Continuous Infusions:   Marzetta Board, MD [facility] Pager [phone]. If 7 PM - 7 AM, please contact night-coverage at [email or web address], password [facility] 07/12/2014, 1:00 PM  LOS: 2 days

## 2014-07-13 LAB — GLUCOSE, CAPILLARY
Glucose-Capillary: 98 mg/dL (ref 70–99)
Glucose-Capillary: 110 mg/dL — ABNORMAL HIGH (ref 70–99)

## 2014-07-13 MED ORDER — LEVOFLOXACIN 750 MG PO TABS
750.0000 mg | ORAL_TABLET | Freq: Every day | ORAL | Status: DC
Start: 2014-07-13 — End: 2014-07-19

## 2014-07-13 NOTE — Discharge Instructions (Signed)
Follow with Olga Millers, MD in 5-7 days  Please get a complete blood count and chemistry panel checked by your Primary MD at your next visit, and again as instructed by your Primary MD. Please get your medications reviewed and adjusted by your Primary MD.  Please request your Primary MD to go over all Hospital Tests and Procedure/Radiological results at the follow up, please get all Hospital records sent to your Prim MD by signing hospital release before you go home.  If you had Pneumonia of Lung problems at the Hospital: Please get a 2 view Chest X ray done in 6-8 weeks after hospital discharge or sooner if instructed by your Primary MD.  If you have Congestive Heart Failure: Please call your Cardiologist or Primary MD anytime you have any of the following symptoms:  1) 3 pound weight gain in 24 hours or 5 pounds in 1 week  2) shortness of breath, with or without a dry hacking cough  3) swelling in the hands, feet or stomach  4) if you have to sleep on extra pillows at night in order to breathe  Follow cardiac low salt diet and 1.5 lit/day fluid restriction.  If you have diabetes Accuchecks 4 times/day, Once in AM empty stomach and then before each meal. Log in all results and show them to your primary doctor at your next visit. If any glucose reading is under 80 or above 300 call your primary MD immediately.  If you have Seizure/Convulsions/Epilepsy: Please do not drive, operate heavy machinery, participate in activities at heights or participate in high speed sports until you have seen by Primary MD or a Neurologist and advised to do so again.  If you had Gastrointestinal Bleeding: Please ask your Primary MD to check a complete blood count within one week of discharge or at your next visit. Your endoscopic/colonoscopic biopsies that are pending at the time of discharge, will also need to followed by your Primary MD.  Get Medicines reviewed and adjusted. Please take all your  medications with you for your next visit with your Primary MD  Please request your Primary MD to go over all hospital tests and procedure/radiological results at the follow up, please ask your Primary MD to get all Hospital records sent to his/her office.  If you experience worsening of your admission symptoms, develop shortness of breath, life threatening emergency, suicidal or homicidal thoughts you must seek medical attention immediately by calling 911 or calling your MD immediately  if symptoms less severe.  You must read complete instructions/literature along with all the possible adverse reactions/side effects for all the Medicines you take and that have been prescribed to you. Take any new Medicines after you have completely understood and accpet all the possible adverse reactions/side effects.   Do not drive or operate heavy machinery when taking Pain medications.   Do not take more than prescribed Pain, Sleep and Anxiety Medications  Special Instructions: If you have smoked or chewed Tobacco  in the last 2 yrs please stop smoking, stop any regular Alcohol  and or any Recreational drug use.  Wear Seat belts while driving.  Please note You were cared for by a hospitalist during your hospital stay. If you have any questions about your discharge medications or the care you received while you were in the hospital after you are discharged, you can call the unit and asked to speak with the hospitalist on call if the hospitalist that took care of you is not available.  Once you are discharged, your primary care physician will handle any further medical issues. Please note that NO REFILLS for any discharge medications will be authorized once you are discharged, as it is imperative that you return to your primary care physician (or establish a relationship with a primary care physician if you do not have one) for your aftercare needs so that they can reassess your need for medications and monitor your  lab values.  You can reach the hospitalist office at phone [phone] or fax [phone]   If you do not have a primary care physician, you can call [phone] for a physician referral.  Activity: As tolerated with Full fall precautions use walker/cane & assistance as needed  Diet: regular  Disposition Home

## 2014-07-13 NOTE — Progress Notes (Signed)
Medicare Important Message given? YES  Date Medicare IM given:  07/13/2014 Medicare IM given by: [place] Oborn  

## 2014-07-13 NOTE — Progress Notes (Signed)
CARE MANAGEMENT NOTE 07/13/2014  Patient:  [name], [name]   Account Number:  [id number]  Date Initiated:  07/11/2014  Documentation initiated by:  Edwyna Shell  Subjective/Objective Assessment:   81 yo male admitted with CAP from home     Action/Plan:   discharge planning   Anticipated DC Date:  07/13/2014   Anticipated DC Plan:  [place]  CM consult      Choice offered to / List presented to:          [facility] arranged  [facility] PT      [place].   Status of service:  Completed, signed off Medicare Important Message given?  YES (If response is "NO", the following Medicare IM given date fields will be blank) Date Medicare IM given:  07/13/2014 Medicare IM given by:  [facility] Date Additional Medicare IM given:   Additional Medicare IM given by:    Discharge Disposition:  [place]  Per UR Regulation:  Reviewed for med. necessity/level of care/duration of stay  If discussed at [place] of Stay Meetings, dates discussed:    Comments:  07/11/14 Charise Killian RN BSN CM [phone] Spoke with patient and spouse. Spouse is in an electric wheelchair and patient ambulated with a walker in the home but the patient has recently gotten weaker requiring more assistance at times. They stated that they have private duty sitters through [employer] 10-6 7 days a week. In addition the patient was receiving [place] PT from [facility] prior to hospitalization. They state that the plan is to return home with sitters and [facility] PT, RN, and SW until they decide which ALF they want to move into. The spouse stated that they have recently started looking at ALF's to move into for increase support and care. Will continue to follow for dc needs.

## 2014-07-13 NOTE — Discharge Summary (Signed)
Physician Discharge Summary  DUAN SCHARNHORST RSW:[id number] DOB: Aug 03, 1927 DOA: 07/10/2014  PCP: Olga Millers, MD  Admit date: 07/10/2014 Discharge date: 07/13/2014  Time spent: < 30 minutes  Recommendations for Outpatient Follow-up:  1. Follow up with Dr. Doug Sou in 2 weeks 2. Continue Levofloxacin for 4 additional days 3. Home health PT set up on discharge   Discharge Diagnoses:  Principal Problem:   CAP (community acquired pneumonia) Active Problems:   Atrial fibrillation   Iron deficiency anemia due to chronic blood loss   Diabetes mellitus   Dementia   Benign hypertensive heart disease without heart failure   Chronic venous insufficiency   Absolute anemia  Discharge Condition: stable  Diet recommendation: regular  Filed Weights   07/11/14 0500 07/12/14 0600 07/12/14 2121  Weight: 112.2 kg (247 lb 5.7 oz) 111.2 kg (245 lb 2.4 oz) 109.7 kg (241 lb 13.5 oz)   History of present illness:  [name] is a 81 y.o. male with a past medical history of dementia, atrial fibrillation not on anticoagulation, history of chronic GI bleeding, history of anemia requiring blood transfusions in the past who lives with his wife. He was apparently in his usual state of health about 2 weeks ago when he started developing a cough. Had clear expectoration. He went to his primary care physician's office yesterday for checkup and PCP suspected pneumonia. He ordered some blood work and a chest x-ray. Blood work revealed severe anemia. Patient was asked to come into the emergency department. It should be noted that the patient has dementia and most of the questions were answered by his wife and his son. There's been no history of shortness of breath, fever, nausea or vomiting. No pain. Patient denies any chest pain currently. No lightheadedness or dizziness. No syncopal episodes. He has been weak overall. Has found it difficult to ambulate using his walker as usual. However, his son feels that  his strength appears to be closer to baseline today. Patient has had colonoscopies in the past which have revealed polyps. There is no history of any gross blood in the stool recently or any black-colored stool, although he does take iron on a regular basis. Once again history is limited due to his dementia.  Hospital Course:  CAP - patient admitted with community acquired PNA, started on IV levofloxacin. He initially required oxygen which was able to be weaned off by HOD 2 and remained stable on room air. He was discharged home in stable condition and will need 4 additional days of Levaquin.  Normocytic anemia in the setting of chronic GI bleeding - history of iron deficiency, chronic GI bleeding - no overt bleeding, brown stool in the ED, however FOBT was positive  - will likely need outpatient GI evaluation if family wants to pursue - s/p 2U pRBC, improved appropriately and has remained stable  A fib not on anticoagulation  - rate controlled Dementia  - at baseline per family Chronic LE edema HTN - stable DM  - A1C shows good control, CBGs good here as well   Procedures:  None    Consultations:  None   Discharge Exam: Filed Vitals:   07/11/14 2113 07/12/14 0600 07/12/14 1516 07/12/14 2121  BP: 142/63 143/69 105/45 130/56  Pulse: 74 87 73 99  Temp: 98.2 F (36.8 C) 98.8 F (37.1 C) 98.7 F (37.1 C) 98.2 F (36.8 C)  TempSrc: Oral Oral Oral Oral  Resp: 20 18 20 20   Weight:  111.2 kg (  245 lb 2.4 oz)  109.7 kg (241 lb 13.5 oz)  SpO2: 97% 99% 93% 99%   General: NAD Cardiovascular: RRR without MRG  Respiratory: RRR, no wheezing, good air movement   Discharge Instructions    Medication List    TAKE these medications        aspirin 81 MG tablet  Take 81 mg by mouth daily.     cholecalciferol 1000 UNITS tablet  Commonly known as:  VITAMIN D  Take 1,000 Units by mouth daily.     COD LIVER OIL PO  Take 1 tablet by mouth daily.     donepezil 10 MG tablet  Commonly  known as:  ARICEPT  Take 1 tablet (10 mg total) by mouth at bedtime.     ferrous sulfate 325 (65 FE) MG tablet  Take 325 mg by mouth 2 (two) times daily.     fexofenadine 180 MG tablet  Commonly known as:  ALLEGRA  Take 1 tablet (180 mg total) by mouth daily.     folic acid 1 MG tablet  Commonly known as:  FOLVITE  Take 1 mg by mouth daily.     furosemide 40 MG tablet  Commonly known as:  LASIX  Take 1 tablet (40 mg total) by mouth every morning.     gabapentin 100 MG capsule  Commonly known as:  NEURONTIN  Take 100 mg by mouth at bedtime.     glipiZIDE 5 MG tablet  Commonly known as:  GLUCOTROL  Take 5 mg by mouth daily.     GLYCOLAX PO  Take 1 packet by mouth daily as needed. For constipation     hydrocortisone 1 % ointment  Apply 1 application topically 2 (two) times daily.     hydrOXYzine 10 MG tablet  Commonly known as:  ATARAX/VISTARIL  TAKE (1) TABLET TWICE A DAY AS NEEDED.     levofloxacin 750 MG tablet  Commonly known as:  LEVAQUIN  Take 1 tablet (750 mg total) by mouth daily.     losartan 50 MG tablet  Commonly known as:  COZAAR  Take 50 mg by mouth daily.     memantine 28 MG Cp24 24 hr capsule  Commonly known as:  NAMENDA XR  Take 28 mg by mouth daily.     metoprolol tartrate 25 MG tablet  Commonly known as:  LOPRESSOR  TAKE 1/2 BY MOUTH EVERY MORNING (GENERIC FOR LOPRESSOR)     nitroGLYCERIN 0.4 MG SL tablet  Commonly known as:  NITROSTAT  Place 1 tablet (0.4 mg total) under the tongue every 5 (five) minutes as needed for chest pain.     pantoprazole 40 MG tablet  Commonly known as:  PROTONIX  Take 40 mg by mouth daily.     solifenacin 5 MG tablet  Commonly known as:  VESICARE  Take 1 tablet (5 mg total) by mouth daily.     triamcinolone 55 MCG/ACT Aero nasal inhaler  Commonly known as:  NASACORT AQ  Place 2 sprays into the nose daily.           Follow-up Information    Follow up with Olga Millers, MD. Schedule an appointment  as soon as possible for a visit in 2 weeks.   Specialty:  Internal Medicine   Contact information:   [place] [id number] [phone]       Follow up with [facility].   Why:  Home Health Physical Therapy   Contact  information:   [address] [place] [address] [phone]       The results of significant diagnostics from this hospitalization (including imaging, microbiology, ancillary and laboratory) are listed below for reference.    Significant Diagnostic Studies: Dg Chest 2 View  07/10/2014   CLINICAL DATA:  Cough, congestion, shortness of breath for 1 month.  EXAM: CHEST  2 VIEW  COMPARISON:  02/28/2014  FINDINGS: Cardiomegaly. Airspace opacity noted at the left lung base, increased since prior study. This could represent atelectasis or developing infiltrate. No effusions. No confluent opacity on the right. No acute bony abnormality.  IMPRESSION: Increasing opacity at the left lung base, atelectasis versus developing infiltrate/pneumonia.   Electronically Signed   By: Rolm Baptise M.D.   On: 07/10/2014 08:14    Microbiology: No results found for this or any previous visit (from the past 240 hour(s)).   Labs: Basic Metabolic Panel:  Recent Labs Lab 07/09/14 1641 07/10/14 1243 07/11/14 0615  NA 136 139 140  K 4.2 3.9 3.6  CL 101 104 104  CO2 31 28 30   GLUCOSE 108* 134* 95  BUN 9 10 9   CREATININE 0.99 0.97 1.01  CALCIUM 8.9 8.3* 8.2*   Liver Function Tests:  Recent Labs Lab 07/09/14 1641 07/10/14 1243 07/11/14 0615  AST 14 18 16   ALT 10 12 13   ALKPHOS 80 68 74  BILITOT 0.3 <0.1* 1.1  PROT 6.0 5.5* 5.6*  ALBUMIN 3.7 3.4* 3.3*   No results for input(s): LIPASE, AMYLASE in the last 168 hours. No results for input(s): AMMONIA in the last 168 hours. CBC:  Recent Labs Lab 07/09/14 1641 07/10/14 1243 07/11/14 0615 07/12/14 1405  WBC 5.2 3.4* 3.8* 5.2  NEUTROABS 3.9  --   --   --   HGB 7.1 Repeated and  verified X2.* 6.7* 8.1* 8.5*  HCT 23.0* 23.5* 28.1* 29.5*  MCV 81.4 87.0 86.5 86.8  PLT 194.0 159 139* 159   Cardiac Enzymes: No results for input(s): CKTOTAL, CKMB, CKMBINDEX, TROPONINI in the last 168 hours. BNP: BNP (last 3 results)  Recent Labs  07/10/14 1318  BNP 149.3*    ProBNP (last 3 results)  Recent Labs  12/17/13 1710  PROBNP 573.8*    CBG:  Recent Labs Lab 07/12/14 1152 07/12/14 1648 07/12/14 2121 07/13/14 0735 07/13/14 1123  GLUCAP 103* 132* 170* 98 110*       Signed:  Sheril Hammond  [facility] 07/13/2014, 2:29 PM

## 2014-07-13 NOTE — Progress Notes (Signed)
Occupational Therapy Treatment Patient Details Name: [name] MRN: [id number] DOB: [birth year] Today's Date: 07/13/2014    History of present illness pt was dmitted for weakness, cough and abnormal labs.  He was dx'd with CAP.  Pt has a h/o dementia, A Fib, RLS, neuropathy, DJD L knee, bladder CA, and DM   OT comments  Pt demonstrates posterior lean and fall risk with basic transfer. PT will requires (A) for all transfers. Pt will benefit from familiar environment due to cognitive deficits. OT to continue to follow acutely for balance with adls  Follow Up Recommendations  Home health OT    Equipment Recommendations  None recommended by OT    Recommendations for Other Services      Precautions / Restrictions Precautions Precautions: Fall       Mobility Bed Mobility Overal bed mobility: Needs Assistance Bed Mobility: Supine to Sit     Supine to sit: Supervision        Transfers Overall transfer level: Needs assistance Equipment used: Rolling walker (2 wheeled) Transfers: Sit to/from Stand Sit to Stand: Max assist         General transfer comment: posterior lean, cues for safety and decr control for descend to chair    Balance Overall balance assessment: Needs assistance Sitting-balance support: Bilateral upper extremity supported;Feet supported Sitting balance-Leahy Scale: Good     Standing balance support: Bilateral upper extremity supported;During functional activity Standing balance-Leahy Scale: Poor                     ADL Overall ADL's : Needs assistance/impaired Eating/Feeding: Set up;Sitting Eating/Feeding Details (indicate cue type and reason): eating soap this session Grooming: Wash/dry face;Set up;Sitting                   Toilet Transfer: Moderate assistance;Ambulation;RW Toilet Transfer Details (indicate cue type and reason): Pt requires (A) to shift weight anteriorly and progress to static standing. pt with posterior lean         Functional mobility during ADLs: Minimal assistance;Rolling walker General ADL Comments: pt demonstrates bed mobility at supervision level however transfer from sitting<>standing requires mod -Max (A). question families ability to complete sit<>Stand with patient. Pt with posterior lean and high risk for falling. Pt pleasant with a kyphotic posture.       Vision                     Perception     Praxis      Cognition   Behavior During Therapy: [facility] for tasks assessed/performed Overall Cognitive Status: Impaired/Different from baseline Area of Impairment: Orientation;Memory;Safety/judgement;Awareness Orientation Level: Disoriented to;Place;Situation;Time   Memory: Decreased short-term memory    Safety/Judgement: Decreased awareness of safety Awareness: Intellectual   General Comments: Pt able to report DOB but reports age as 89    Extremity/Trunk Assessment               Exercises     Shoulder Instructions       General Comments      Pertinent Vitals/ Pain       Pain Assessment: No/denies pain  Home Living                                          Prior Functioning/Environment              Frequency Min 2X/week  Progress Toward Goals  OT Goals(current goals can now be found in the care plan section)  Progress towards OT goals: Progressing toward goals  Acute Rehab OT Goals Patient Stated Goal: return home with existing supports while they look for ALF OT Goal Formulation: With patient Time For Goal Achievement: 07/18/14 Potential to Achieve Goals: Good ADL Goals Pt Will Transfer to Toilet: with supervision;bedside commode;stand pivot transfer Pt Will Perform Toileting - Clothing Manipulation and hygiene: with supervision;sit to/from stand Additional ADL Goal #1: Pt will perform bed mobility at supervision level in preparation for toilet transfers  Plan Discharge plan remains appropriate (no family present  but present previous session wtih PT)    Co-evaluation                 End of Session Equipment Utilized During Treatment: Gait belt;Rolling walker   Activity Tolerance Patient tolerated treatment well   Patient Left in chair;with call bell/phone within reach;with nursing/sitter in room   Nurse Communication Mobility status;Precautions        Time: 7116-5790 OT Time Calculation (min): 16 min  Charges: OT General Charges $OT Visit: 1 Procedure OT Treatments $Self Care/Home Management : 8-22 mins  Parke Poisson B 07/13/2014, 11:59 AM Pager: [phone]

## 2014-07-14 DIAGNOSIS — I340 Nonrheumatic mitral (valve) insufficiency: Secondary | ICD-10-CM | POA: Diagnosis not present

## 2014-07-14 DIAGNOSIS — E119 Type 2 diabetes mellitus without complications: Secondary | ICD-10-CM | POA: Diagnosis not present

## 2014-07-14 DIAGNOSIS — I5033 Acute on chronic diastolic (congestive) heart failure: Secondary | ICD-10-CM | POA: Diagnosis not present

## 2014-07-14 DIAGNOSIS — S41101D Unspecified open wound of right upper arm, subsequent encounter: Secondary | ICD-10-CM | POA: Diagnosis not present

## 2014-07-14 DIAGNOSIS — F0390 Unspecified dementia without behavioral disturbance: Secondary | ICD-10-CM | POA: Diagnosis not present

## 2014-07-14 DIAGNOSIS — G309 Alzheimer's disease, unspecified: Secondary | ICD-10-CM | POA: Diagnosis not present

## 2014-07-14 LAB — TYPE AND SCREEN
ABO/RH(D): A POS
Antibody Screen: NEGATIVE
UNIT DIVISION: 0
Unit division: 0
Unit division: 0

## 2014-07-15 ENCOUNTER — Telehealth: Payer: Self-pay | Admitting: *Deleted

## 2014-07-15 DIAGNOSIS — E119 Type 2 diabetes mellitus without complications: Secondary | ICD-10-CM | POA: Diagnosis not present

## 2014-07-15 DIAGNOSIS — I5033 Acute on chronic diastolic (congestive) heart failure: Secondary | ICD-10-CM | POA: Diagnosis not present

## 2014-07-15 DIAGNOSIS — F0390 Unspecified dementia without behavioral disturbance: Secondary | ICD-10-CM | POA: Diagnosis not present

## 2014-07-15 DIAGNOSIS — I340 Nonrheumatic mitral (valve) insufficiency: Secondary | ICD-10-CM | POA: Diagnosis not present

## 2014-07-15 DIAGNOSIS — G309 Alzheimer's disease, unspecified: Secondary | ICD-10-CM | POA: Diagnosis not present

## 2014-07-15 DIAGNOSIS — S41101D Unspecified open wound of right upper arm, subsequent encounter: Secondary | ICD-10-CM | POA: Diagnosis not present

## 2014-07-15 NOTE — Telephone Encounter (Signed)
Transition Care Management Follow-up Telephone Call D/C 07/13/14  How have you been since you were released from the hospital? Called pt spoke with wife (Mrs. Bankhead) she stated he is not doing to well today. He is c/o being weak.   Do you understand why you were in the hospital? YES   Do you understand the discharge instrcutions? YES, we reviewed together  Items Reviewed:  Medications reviewed: YES  Allergies reviewed: YES  Dietary changes reviewed: YES, wife states he ate good yesterday but today not eating much. Advise pt to make sure he his drinking so he want get dehydrated  Referrals reviewed: YES, she stated advance came out yesterday   Functional Questionnaire:   Activities of Daily Living (ADLs):   She states they are independent in the following: ambulation, feeding, grooming and toileting States he require assistance with the following: ambulation, bathing and hygiene and dressing   Any transportation issues/concerns?: NO   Any patient concerns? YES, concern about him being so weak    Confirmed importance and date/time of follow-up visits scheduled: YES, pt is actual seeing Dr. Ronnald Ramp tomorrow 07/16/14 since he is c/o of weakness    Confirmed with patient if condition begins to worsen call PCP or go to the ER.

## 2014-07-16 ENCOUNTER — Ambulatory Visit (INDEPENDENT_AMBULATORY_CARE_PROVIDER_SITE_OTHER)
Admission: RE | Admit: 2014-07-16 | Discharge: 2014-07-16 | Disposition: A | Discharge status: Home / Self Care | Payer: Medicare Other | Source: Ambulatory Visit | Attending: Internal Medicine | Admitting: Internal Medicine

## 2014-07-16 ENCOUNTER — Encounter: Payer: Self-pay | Admitting: Internal Medicine

## 2014-07-16 ENCOUNTER — Ambulatory Visit (INDEPENDENT_AMBULATORY_CARE_PROVIDER_SITE_OTHER): Payer: Medicare Other | Admitting: Internal Medicine

## 2014-07-16 ENCOUNTER — Other Ambulatory Visit (INDEPENDENT_AMBULATORY_CARE_PROVIDER_SITE_OTHER): Payer: Medicare Other

## 2014-07-16 VITALS — BP 122/68 | HR 88 | Temp 97.7°F | Resp 16 | Ht 75.0 in | Wt 238.0 lb

## 2014-07-16 DIAGNOSIS — E108 Type 1 diabetes mellitus with unspecified complications: Secondary | ICD-10-CM

## 2014-07-16 DIAGNOSIS — R918 Other nonspecific abnormal finding of lung field: Secondary | ICD-10-CM

## 2014-07-16 DIAGNOSIS — D509 Iron deficiency anemia, unspecified: Secondary | ICD-10-CM | POA: Diagnosis not present

## 2014-07-16 DIAGNOSIS — J984 Other disorders of lung: Secondary | ICD-10-CM

## 2014-07-16 DIAGNOSIS — J189 Pneumonia, unspecified organism: Secondary | ICD-10-CM

## 2014-07-16 DIAGNOSIS — J449 Chronic obstructive pulmonary disease, unspecified: Secondary | ICD-10-CM | POA: Diagnosis not present

## 2014-07-16 LAB — CBC WITH DIFFERENTIAL/PLATELET
Basophils Absolute: 0 10*3/uL (ref 0.0–0.1)
Basophils Relative: 0.3 % (ref 0.0–3.0)
EOS PCT: 2.3 % (ref 0.0–5.0)
Eosinophils Absolute: 0.1 10*3/uL (ref 0.0–0.7)
HCT: 32.1 % — ABNORMAL LOW (ref 39.0–52.0)
Hemoglobin: 9.9 g/dL — ABNORMAL LOW (ref 13.0–17.0)
LYMPHS ABS: 0.6 10*3/uL — AB (ref 0.7–4.0)
Lymphocytes Relative: 11.6 % — ABNORMAL LOW (ref 12.0–46.0)
MCHC: 30.8 g/dL (ref 30.0–36.0)
MCV: 81.3 fl (ref 78.0–100.0)
MONO ABS: 0.8 10*3/uL (ref 0.1–1.0)
MONOS PCT: 15.5 % — AB (ref 3.0–12.0)
NEUTROS ABS: 3.7 10*3/uL (ref 1.4–7.7)
NEUTROS PCT: 70.3 % (ref 43.0–77.0)
Platelets: 204 10*3/uL (ref 150.0–400.0)
RBC: 3.95 Mil/uL — ABNORMAL LOW (ref 4.22–5.81)
RDW: 17.7 % — ABNORMAL HIGH (ref 11.5–15.5)
WBC: 5.3 10*3/uL (ref 4.0–10.5)

## 2014-07-16 LAB — HEMOGLOBIN A1C: HEMOGLOBIN A1C: 4.5 % — AB (ref 4.6–6.5)

## 2014-07-16 NOTE — Assessment & Plan Note (Signed)
His A1c is low and he has not been feeling well Will discontinue the SU

## 2014-07-16 NOTE — Assessment & Plan Note (Signed)
Will get a CT with contrast to better identify this as PNA, mass, effusion, etc.

## 2014-07-16 NOTE — Assessment & Plan Note (Signed)
CXR is not improved Will get a CT done to see if there is an empyema For now, will continue levaquin

## 2014-07-16 NOTE — Patient Instructions (Signed)

## 2014-07-16 NOTE — Assessment & Plan Note (Signed)
Improvement noted on the CBC today

## 2014-07-16 NOTE — Progress Notes (Signed)
Pre visit review using our clinic review tool, if applicable. No additional management support is needed unless otherwise documented below in the visit note. 

## 2014-07-16 NOTE — Progress Notes (Signed)
Subjective:    Patient ID: [name], male    DOB: 1927/12/01, 81 y.o.   MRN: [id number]  Cough This is a recurrent problem. The current episode started in the past 7 days. The problem has been gradually improving. The problem occurs every few hours. The cough is productive of sputum. Pertinent negatives include no chest pain, chills, ear congestion, ear pain, fever, headaches, heartburn, hemoptysis, myalgias, nasal congestion, postnasal drip, rash, rhinorrhea, sore throat, shortness of breath, sweats, weight loss or wheezing. He has tried prescription cough suppressant for the symptoms. The treatment provided moderate relief. His past medical history is significant for pneumonia.      Review of Systems  Constitutional: Positive for fatigue. Negative for fever, chills, weight loss, diaphoresis and appetite change.  HENT: Negative.  Negative for ear pain, postnasal drip, rhinorrhea and sore throat.   Eyes: Negative.   Respiratory: Positive for cough. Negative for apnea, hemoptysis, choking, chest tightness, shortness of breath, wheezing and stridor.   Cardiovascular: Negative.  Negative for chest pain, palpitations and leg swelling.  Gastrointestinal: Negative.  Negative for heartburn, nausea, vomiting, abdominal pain, diarrhea, constipation, blood in stool, abdominal distention, anal bleeding and rectal pain.  Endocrine: Negative.  Negative for polydipsia, polyphagia and polyuria.  Genitourinary: Negative.   Musculoskeletal: Negative.  Negative for myalgias, back pain and neck stiffness.  Skin: Negative.  Negative for color change and rash.  Allergic/Immunologic: Negative.   Neurological: Positive for weakness. Negative for dizziness, light-headedness and headaches.  Hematological: Negative.  Negative for adenopathy.  Psychiatric/Behavioral: Negative.        Objective:   Physical Exam  Constitutional: He is oriented to person, place, and time. He appears well-developed and  well-nourished.  Non-toxic appearance. He does not have a sickly appearance. He does not appear ill. No distress.  HENT:  Head: Normocephalic and atraumatic.  Mouth/Throat: Oropharynx is clear and moist. No oropharyngeal exudate.  Eyes: Conjunctivae are normal. Right eye exhibits no discharge. Left eye exhibits no discharge. No scleral icterus.  Neck: Normal range of motion. Neck supple. No JVD present. No tracheal deviation present. No thyromegaly present.  Cardiovascular: Normal rate, normal heart sounds and intact distal pulses.  An irregularly irregular rhythm present. Exam reveals no gallop and no friction rub.   No murmur heard. Pulmonary/Chest: Effort normal. No stridor. No respiratory distress. He has decreased breath sounds in the left lower field. He has no wheezes. He has no rhonchi. He has no rales. He exhibits no tenderness.  Abdominal: Soft. Bowel sounds are normal. He exhibits no distension. There is no tenderness. There is no rebound and no guarding.  Musculoskeletal: Normal range of motion. He exhibits edema (1+ pitting edema in BLE). He exhibits no tenderness.  Lymphadenopathy:    He has no cervical adenopathy.  Neurological: He is oriented to person, place, and time.  Skin: Skin is warm and dry. No rash noted. He is not diaphoretic. No erythema. No pallor.  Psychiatric: He has a normal mood and affect. His behavior is normal. Judgment and thought content normal.  Vitals reviewed.    Lab Results  Component Value Date   WBC 5.2 07/12/2014   HGB 8.5* 07/12/2014   HCT 29.5* 07/12/2014   PLT 159 07/12/2014   GLUCOSE 95 07/11/2014   CHOL 101 06/04/2014   TRIG 45.0 06/04/2014   HDL 43.10 06/04/2014   LDLCALC 49 06/04/2014   ALT 13 07/11/2014   AST 16 07/11/2014   NA 140 07/11/2014   K  3.6 07/11/2014   CL 104 07/11/2014   CREATININE 1.01 07/11/2014   BUN 9 07/11/2014   CO2 30 07/11/2014   TSH 3.19 07/09/2014   INR 1.31 07/22/2009   HGBA1C 5.6 12/18/2013         Assessment & Plan:

## 2014-07-17 ENCOUNTER — Telehealth: Payer: Self-pay | Admitting: Geriatric Medicine

## 2014-07-17 DIAGNOSIS — D500 Iron deficiency anemia secondary to blood loss (chronic): Secondary | ICD-10-CM | POA: Diagnosis not present

## 2014-07-17 DIAGNOSIS — K922 Gastrointestinal hemorrhage, unspecified: Secondary | ICD-10-CM | POA: Diagnosis not present

## 2014-07-17 NOTE — Telephone Encounter (Signed)
Patient went into Dr. Lissa Morales clinic today at [facility]  to be seen for his bleeding. Dr. Arsenio Loader states that the patient is in no shape to have an endoscopy. He was discharged from the hospital a week ago with pneumonia and he has borderline heart failure and  his hemoglobin is low. He received 2 units of blood while in the hospital.  Dr. Arsenio Loader says that he does not think it is an acute GI bleed, but he says that the patient needs a follow up appt with you within a week. His O2 stats are in the low 90's on room air sitting still. This is just an [place]. I will follow up to make sure this appt is scheduled. The wife has been informed by Dr. Arsenio Loader to make one for her husband.

## 2014-07-18 ENCOUNTER — Ambulatory Visit (INDEPENDENT_AMBULATORY_CARE_PROVIDER_SITE_OTHER)
Admission: RE | Admit: 2014-07-18 | Discharge: 2014-07-18 | Disposition: A | Discharge status: Home / Self Care | Payer: Medicare Other | Source: Ambulatory Visit | Attending: Internal Medicine | Admitting: Internal Medicine

## 2014-07-18 ENCOUNTER — Encounter: Payer: Self-pay | Admitting: Internal Medicine

## 2014-07-18 DIAGNOSIS — R918 Other nonspecific abnormal finding of lung field: Secondary | ICD-10-CM | POA: Diagnosis not present

## 2014-07-18 DIAGNOSIS — F0390 Unspecified dementia without behavioral disturbance: Secondary | ICD-10-CM | POA: Diagnosis not present

## 2014-07-18 DIAGNOSIS — G309 Alzheimer's disease, unspecified: Secondary | ICD-10-CM | POA: Diagnosis not present

## 2014-07-18 DIAGNOSIS — E119 Type 2 diabetes mellitus without complications: Secondary | ICD-10-CM | POA: Diagnosis not present

## 2014-07-18 DIAGNOSIS — J90 Pleural effusion, not elsewhere classified: Secondary | ICD-10-CM | POA: Diagnosis not present

## 2014-07-18 DIAGNOSIS — J189 Pneumonia, unspecified organism: Secondary | ICD-10-CM

## 2014-07-18 DIAGNOSIS — I5033 Acute on chronic diastolic (congestive) heart failure: Secondary | ICD-10-CM | POA: Diagnosis not present

## 2014-07-18 DIAGNOSIS — I340 Nonrheumatic mitral (valve) insufficiency: Secondary | ICD-10-CM | POA: Diagnosis not present

## 2014-07-18 DIAGNOSIS — J984 Other disorders of lung: Secondary | ICD-10-CM

## 2014-07-18 DIAGNOSIS — S41101D Unspecified open wound of right upper arm, subsequent encounter: Secondary | ICD-10-CM | POA: Diagnosis not present

## 2014-07-18 MED ORDER — IOHEXOL 300 MG/ML  SOLN
80.0000 mL | Freq: Once | INTRAMUSCULAR | Status: AC | PRN
  Administered 2014-07-18: 80 mL via INTRAVENOUS

## 2014-07-19 ENCOUNTER — Ambulatory Visit (INDEPENDENT_AMBULATORY_CARE_PROVIDER_SITE_OTHER): Payer: Medicare Other | Admitting: Internal Medicine

## 2014-07-19 ENCOUNTER — Encounter: Payer: Self-pay | Admitting: Internal Medicine

## 2014-07-19 VITALS — BP 120/70 | HR 91 | Temp 97.9°F | Resp 18 | Ht 75.0 in | Wt 236.1 lb

## 2014-07-19 DIAGNOSIS — I340 Nonrheumatic mitral (valve) insufficiency: Secondary | ICD-10-CM | POA: Diagnosis not present

## 2014-07-19 DIAGNOSIS — F0390 Unspecified dementia without behavioral disturbance: Secondary | ICD-10-CM | POA: Diagnosis not present

## 2014-07-19 DIAGNOSIS — G309 Alzheimer's disease, unspecified: Secondary | ICD-10-CM | POA: Diagnosis not present

## 2014-07-19 DIAGNOSIS — J189 Pneumonia, unspecified organism: Secondary | ICD-10-CM

## 2014-07-19 DIAGNOSIS — I5033 Acute on chronic diastolic (congestive) heart failure: Secondary | ICD-10-CM | POA: Diagnosis not present

## 2014-07-19 DIAGNOSIS — S41101D Unspecified open wound of right upper arm, subsequent encounter: Secondary | ICD-10-CM | POA: Diagnosis not present

## 2014-07-19 DIAGNOSIS — D500 Iron deficiency anemia secondary to blood loss (chronic): Secondary | ICD-10-CM | POA: Diagnosis not present

## 2014-07-19 DIAGNOSIS — E119 Type 2 diabetes mellitus without complications: Secondary | ICD-10-CM | POA: Diagnosis not present

## 2014-07-19 NOTE — Progress Notes (Signed)
Pre visit review using our clinic review tool, if applicable. No additional management support is needed unless otherwise documented below in the visit note. 

## 2014-07-19 NOTE — Patient Instructions (Signed)
We are not checking any blood work today and will see you back in about 4 weeks so we can check on the blood levels and the chest x-ray.   We do not think that he should have the endoscopy and we do not think that he should have any more CT scans of the lung.   If he has any problems before then please feel free to call us before you return.

## 2014-07-21 NOTE — Assessment & Plan Note (Signed)
Taking iron pills today. Check CBC today. Would not recommend any further evaluation with either EGD or colonoscopy due to his failing health and mind. Will monitor CBC and if signs of bleeding (dark stools or dark vomit) treat symptomatically.

## 2014-07-21 NOTE — Progress Notes (Signed)
   Subjective:    Patient ID: [name], male    DOB: August 21, 1927, 81 y.o.   MRN: [id number]  HPI The patient is here for follow up after hospitalization for GI bleed. He was seen by a GI doctor who did not feel his current health was good enough to go under EGD. He denies diarrhea or dark stools since leaving the hospital. He denies SOB or fatigue. Taking iron pills but his wife is concerned about constipation. He is still suffering with severe dementia.   Review of Systems  Constitutional: Negative for fever, activity change, appetite change and fatigue.  Respiratory: Negative for cough, chest tightness, shortness of breath and wheezing.   Cardiovascular: Positive for leg swelling. Negative for chest pain and palpitations.  Gastrointestinal: Negative for abdominal pain, diarrhea, constipation and abdominal distention.  Musculoskeletal: Positive for gait problem. Negative for myalgias and arthralgias.  Skin: Positive for wound.  Neurological: Positive for weakness. Negative for dizziness, light-headedness and headaches.      Objective:   Physical Exam  Constitutional: He appears well-developed and well-nourished. No distress.  HENT:  Head: Normocephalic and atraumatic.  Eyes: EOM are normal.  Neck: Normal range of motion.  Cardiovascular: Normal rate and regular rhythm.   Pulmonary/Chest: Effort normal and breath sounds normal. No respiratory distress. He has no wheezes. He has no rales.  Abdominal: Soft. Bowel sounds are normal.  Musculoskeletal: He exhibits edema.  1-2+ edema in the legs bilaterally and hardened skin around the ankles bilaterally with some color change suggesting chronic congestion.  Neurological: He is alert. Coordination abnormal.  Skin: Skin is warm and dry.  Healing lesions on the upper arms and upper back with stigmata of scratching.    Filed Vitals:   07/19/14 1104  BP: 120/70  Pulse: 91  Temp: 97.9 F (36.6 C)  TempSrc: Oral  Resp: 18  Height: 6'  3" (1.905 m)  Weight: 236 lb 1.9 oz (107.103 kg)  SpO2: 92%      Assessment & Plan:

## 2014-07-21 NOTE — Assessment & Plan Note (Signed)
Not in exacerbation today. Denies SOB with lying.

## 2014-07-21 NOTE — Assessment & Plan Note (Signed)
Needs follow up chest x-ray in about 1 month. Would not repeat CT lungs in 3 months as per recommendation as he is not medically fit to undergo any kind of treatment if lung cancer is found. His severe dementia limits significantly his current health.

## 2014-07-22 DIAGNOSIS — G309 Alzheimer's disease, unspecified: Secondary | ICD-10-CM | POA: Diagnosis not present

## 2014-07-22 DIAGNOSIS — E119 Type 2 diabetes mellitus without complications: Secondary | ICD-10-CM | POA: Diagnosis not present

## 2014-07-22 DIAGNOSIS — S41101D Unspecified open wound of right upper arm, subsequent encounter: Secondary | ICD-10-CM | POA: Diagnosis not present

## 2014-07-22 DIAGNOSIS — I5033 Acute on chronic diastolic (congestive) heart failure: Secondary | ICD-10-CM | POA: Diagnosis not present

## 2014-07-22 DIAGNOSIS — F0390 Unspecified dementia without behavioral disturbance: Secondary | ICD-10-CM | POA: Diagnosis not present

## 2014-07-22 DIAGNOSIS — I340 Nonrheumatic mitral (valve) insufficiency: Secondary | ICD-10-CM | POA: Diagnosis not present

## 2014-07-23 DIAGNOSIS — I5033 Acute on chronic diastolic (congestive) heart failure: Secondary | ICD-10-CM | POA: Diagnosis not present

## 2014-07-23 DIAGNOSIS — G309 Alzheimer's disease, unspecified: Secondary | ICD-10-CM | POA: Diagnosis not present

## 2014-07-23 DIAGNOSIS — I340 Nonrheumatic mitral (valve) insufficiency: Secondary | ICD-10-CM | POA: Diagnosis not present

## 2014-07-23 DIAGNOSIS — E119 Type 2 diabetes mellitus without complications: Secondary | ICD-10-CM | POA: Diagnosis not present

## 2014-07-23 DIAGNOSIS — S41101D Unspecified open wound of right upper arm, subsequent encounter: Secondary | ICD-10-CM | POA: Diagnosis not present

## 2014-07-23 DIAGNOSIS — F0390 Unspecified dementia without behavioral disturbance: Secondary | ICD-10-CM | POA: Diagnosis not present

## 2014-07-24 ENCOUNTER — Telehealth: Payer: Self-pay | Admitting: Cardiology

## 2014-07-24 DIAGNOSIS — I5033 Acute on chronic diastolic (congestive) heart failure: Secondary | ICD-10-CM | POA: Diagnosis not present

## 2014-07-24 DIAGNOSIS — S41101D Unspecified open wound of right upper arm, subsequent encounter: Secondary | ICD-10-CM | POA: Diagnosis not present

## 2014-07-24 DIAGNOSIS — I340 Nonrheumatic mitral (valve) insufficiency: Secondary | ICD-10-CM | POA: Diagnosis not present

## 2014-07-24 DIAGNOSIS — F0390 Unspecified dementia without behavioral disturbance: Secondary | ICD-10-CM | POA: Diagnosis not present

## 2014-07-24 DIAGNOSIS — E119 Type 2 diabetes mellitus without complications: Secondary | ICD-10-CM | POA: Diagnosis not present

## 2014-07-24 DIAGNOSIS — G309 Alzheimer's disease, unspecified: Secondary | ICD-10-CM | POA: Diagnosis not present

## 2014-07-24 NOTE — Telephone Encounter (Signed)
New problem   Pt's wife want a call back from nurse concerning pt's condition. Please call pt's wife.

## 2014-07-24 NOTE — Telephone Encounter (Signed)
Spoke with wife and scheduled follow up appointment for patient Patient seen in hospital recently, has had f/u with PCP since d/c

## 2014-07-25 DIAGNOSIS — F0390 Unspecified dementia without behavioral disturbance: Secondary | ICD-10-CM | POA: Diagnosis not present

## 2014-07-25 DIAGNOSIS — E119 Type 2 diabetes mellitus without complications: Secondary | ICD-10-CM | POA: Diagnosis not present

## 2014-07-25 DIAGNOSIS — I340 Nonrheumatic mitral (valve) insufficiency: Secondary | ICD-10-CM | POA: Diagnosis not present

## 2014-07-25 DIAGNOSIS — S41101D Unspecified open wound of right upper arm, subsequent encounter: Secondary | ICD-10-CM | POA: Diagnosis not present

## 2014-07-25 DIAGNOSIS — G309 Alzheimer's disease, unspecified: Secondary | ICD-10-CM | POA: Diagnosis not present

## 2014-07-25 DIAGNOSIS — I5033 Acute on chronic diastolic (congestive) heart failure: Secondary | ICD-10-CM | POA: Diagnosis not present

## 2014-07-30 DIAGNOSIS — E119 Type 2 diabetes mellitus without complications: Secondary | ICD-10-CM | POA: Diagnosis not present

## 2014-07-30 DIAGNOSIS — G309 Alzheimer's disease, unspecified: Secondary | ICD-10-CM | POA: Diagnosis not present

## 2014-07-30 DIAGNOSIS — S41101D Unspecified open wound of right upper arm, subsequent encounter: Secondary | ICD-10-CM | POA: Diagnosis not present

## 2014-07-30 DIAGNOSIS — I340 Nonrheumatic mitral (valve) insufficiency: Secondary | ICD-10-CM | POA: Diagnosis not present

## 2014-07-30 DIAGNOSIS — F0390 Unspecified dementia without behavioral disturbance: Secondary | ICD-10-CM | POA: Diagnosis not present

## 2014-07-30 DIAGNOSIS — I5033 Acute on chronic diastolic (congestive) heart failure: Secondary | ICD-10-CM | POA: Diagnosis not present

## 2014-08-01 ENCOUNTER — Telehealth: Payer: Self-pay | Admitting: Internal Medicine

## 2014-08-01 DIAGNOSIS — F0390 Unspecified dementia without behavioral disturbance: Secondary | ICD-10-CM | POA: Diagnosis not present

## 2014-08-01 DIAGNOSIS — S41101D Unspecified open wound of right upper arm, subsequent encounter: Secondary | ICD-10-CM | POA: Diagnosis not present

## 2014-08-01 DIAGNOSIS — I5033 Acute on chronic diastolic (congestive) heart failure: Secondary | ICD-10-CM | POA: Diagnosis not present

## 2014-08-01 DIAGNOSIS — G309 Alzheimer's disease, unspecified: Secondary | ICD-10-CM | POA: Diagnosis not present

## 2014-08-01 DIAGNOSIS — E119 Type 2 diabetes mellitus without complications: Secondary | ICD-10-CM | POA: Diagnosis not present

## 2014-08-01 DIAGNOSIS — I340 Nonrheumatic mitral (valve) insufficiency: Secondary | ICD-10-CM | POA: Diagnosis not present

## 2014-08-01 NOTE — Telephone Encounter (Signed)
[name] [phone] [facility]    Need verbal orders for nursing next 60 days

## 2014-08-01 NOTE — Telephone Encounter (Signed)
Called and spoke with [place] and gave verbal ok.

## 2014-08-02 ENCOUNTER — Ambulatory Visit (INDEPENDENT_AMBULATORY_CARE_PROVIDER_SITE_OTHER): Payer: Medicare Other | Admitting: Cardiology

## 2014-08-02 ENCOUNTER — Encounter: Payer: Self-pay | Admitting: Cardiology

## 2014-08-02 VITALS — BP 126/56 | HR 61 | Ht 75.0 in | Wt 228.1 lb

## 2014-08-02 DIAGNOSIS — D500 Iron deficiency anemia secondary to blood loss (chronic): Secondary | ICD-10-CM

## 2014-08-02 DIAGNOSIS — I482 Chronic atrial fibrillation, unspecified: Secondary | ICD-10-CM

## 2014-08-02 LAB — CBC WITH DIFFERENTIAL/PLATELET
Basophils Absolute: 0 10*3/uL (ref 0.0–0.1)
Basophils Relative: 0.2 % (ref 0.0–3.0)
EOS ABS: 0.1 10*3/uL (ref 0.0–0.7)
Eosinophils Relative: 1.8 % (ref 0.0–5.0)
HCT: 33.7 % — ABNORMAL LOW (ref 39.0–52.0)
HEMOGLOBIN: 10.9 g/dL — AB (ref 13.0–17.0)
Lymphocytes Relative: 14.3 % (ref 12.0–46.0)
Lymphs Abs: 0.6 10*3/uL — ABNORMAL LOW (ref 0.7–4.0)
MCHC: 32.2 g/dL (ref 30.0–36.0)
MCV: 79.1 fl (ref 78.0–100.0)
MONO ABS: 0.5 10*3/uL (ref 0.1–1.0)
Monocytes Relative: 12.1 % — ABNORMAL HIGH (ref 3.0–12.0)
Neutro Abs: 2.9 10*3/uL (ref 1.4–7.7)
Neutrophils Relative %: 71.6 % (ref 43.0–77.0)
PLATELETS: 155 10*3/uL (ref 150.0–400.0)
RBC: 4.26 Mil/uL (ref 4.22–5.81)
RDW: 18.2 % — AB (ref 11.5–15.5)
WBC: 4.1 10*3/uL (ref 4.0–10.5)

## 2014-08-02 LAB — BASIC METABOLIC PANEL
BUN: 11 mg/dL (ref 6–23)
CHLORIDE: 96 meq/L (ref 96–112)
CO2: 35 mEq/L — ABNORMAL HIGH (ref 19–32)
CREATININE: 0.85 mg/dL (ref 0.40–1.50)
Calcium: 9.6 mg/dL (ref 8.4–10.5)
GFR: 90.99 mL/min (ref >60.00)
Glucose, Bld: 127 mg/dL — ABNORMAL HIGH (ref 70–99)
Potassium: 4.1 mEq/L (ref 3.5–5.1)
SODIUM: 133 meq/L — AB (ref 135–145)

## 2014-08-02 NOTE — Patient Instructions (Signed)
Medication Instructions:  Your physician recommends that you continue on your current medications as directed. Please refer to the Current Medication list given to you today.  Labwork: bmet/cbc  Testing/Procedures: none  Follow-Up Keep July appointment   Any Other Special Instructions Will Be Listed Below (If Applicable).

## 2014-08-02 NOTE — Progress Notes (Signed)
Cardiology Office Note   Date:  08/02/2014   ID:  [name], DOB 12-Feb-1928, MRN [id number]  PCP:  Olga Millers, MD  Cardiologist: Darlin Coco MD  No chief complaint on file.     History of Present Illness: [name] is a 81 y.o. male who presents for a post hospital cardiology follow-up.  This pleasant 81 year old gentleman is seen following a recent hospitalization for community-acquired pneumonia and severe anemia. He has a past history of chronic established atrial fibrillation. He is not on Coumadin because of recurrent GI bleeds. He also has problems with exogenous obesity and diabetes.  Recently he has had progressive weight loss and poor appetite.. He had a cardiac catheterization on 08/07/01 showing normal coronary arteries and an ejection fraction of 60-65%. He had an echocardiogram on 04/21/11 showing an ejection fraction of 60-65%, moderate mitral regurgitation, mild aortic insufficiency, moderate tricuspid regurgitation, and pulmonary artery pressure was 46 mmHg.  he has mild chronic pretibial edema.  He has chronic mild pul monary vascular congestion with basilar rales.  He had a Lexiscan Myoview on 01/23/13 showing no ischemia. The study was not gated because of his atrial fibrillation. The patient had an echocardiogram 05/30/13 showing an ejection fraction of 55-60%. The patient has worsening dementia. His wife and family are starting to consider a moved to a retirement community for both him and his wife. His last visit the patient denies any chest pain or shortness of breath dizziness or syncope. His appetite is somewhat diminished according to his wife. The patient denies any awareness of hematochezia or melena.   Past Medical History  Diagnosis Date  . Arrhythmia     a fib  . ED (erectile dysfunction)   . Memory disorder   . 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 Hematochezia     has intermittent problem  . Arthritis    DJD knee - left  . Restless leg     treats with heat  . Cancer     bladder cancer - BCG [name]  . Diabetes mellitus     NIDDM on oral medication  . Genital warts     recurrent. Not a problem  . GERD (gastroesophageal reflux disease)     treated with PPI  . Hypertension     mild  . Neuropathy     burning discomfort feet/legs  . Irritable bladder     Past Surgical History  Procedure Laterality Date  . Cardiovascular stress test  03/28/2003    EF 55%.  Normal cardiolite study  . Appendectomy  1946  . Tonsillectomy  1938  . Joint replacement  1990's    left TKR  . Knee arthroscopy  1980's    right knee     Current Outpatient Prescriptions  Medication Sig Dispense Refill  . aspirin 81 MG tablet Take 81 mg by mouth daily.    [name] donepezil (ARICEPT) 10 MG tablet Take 1 tablet (10 mg total) by mouth at bedtime. 90 tablet 3  . ferrous sulfate 325 (65 FE) MG tablet Take 325 mg by mouth 2 (two) times daily.     . fexofenadine (ALLEGRA) 180 MG tablet Take 1 tablet (180 mg total) by mouth daily. 30 tablet 2  . folic acid (FOLVITE) 1 MG tablet Take 1 mg by mouth daily.    . furosemide (LASIX) 40 MG tablet Take 1 tablet (40 mg total) by mouth every morning. 135 tablet  3  . gabapentin (NEURONTIN) 100 MG capsule Take 100 mg by mouth at bedtime.    . hydrocortisone 1 % ointment Apply 1 application topically 2 (two) times daily. 30 g 0  . hydrOXYzine (ATARAX/VISTARIL) 10 MG tablet Take 10 mg by mouth 2 (two) times daily as needed (for anxiety or allergies).    . Memantine HCl ER (NAMENDA XR) 28 MG CP24 Take 28 mg by mouth daily. 90 capsule 4  . metoprolol tartrate (LOPRESSOR) 25 MG tablet Take 12.5 mg by mouth daily.    . nitroGLYCERIN (NITROSTAT) 0.4 MG SL tablet Place 1 tablet (0.4 mg total) under the tongue every 5 (five) minutes as needed for chest pain. 100 tablet 0  . solifenacin (VESICARE) 5 MG tablet Take 1 tablet (5 mg total) by mouth daily. 90 tablet 1   No current  facility-administered medications for this visit.    Allergies:   Cephalexin; Furacin; Macrodantin; and Zestril    Social History:  The patient  reports that he quit smoking about 38 years ago. He has quit using smokeless tobacco. He reports that he drinks about 4.2 oz of alcohol per week. He reports that he does not use illicit drugs.   Family History:  The patient's family history includes Cancer in his father; Heart disease in his mother; Kidney disease in his mother; Mental illness in his brother; Stroke in his mother. There is no history of Diabetes or COPD.    ROS:  Please see the history of present illness.   Otherwise, review of systems are positive for none.   All other systems are reviewed and negative.    PHYSICAL EXAM: VS:  BP 126/56 mmHg  Pulse 61  Ht 6\' 3"  (1.905 m)  Wt 228 lb 1.9 oz (103.475 kg)  BMI 28.51 kg/m2 , BMI Body mass index is 28.51 kg/(m^2). GEN: Well nourished, well developed, in no acute distress.  No respiratory distress on room air.  He travels by wheelchair. HEENT: normal Neck: no JVD, carotid bruits, or masses Cardiac: Irregularly irregular pulse; soft apical systolic murmur.  1-2+ pretibial edema. Respiratory:  Mild bibasilar rhonchi and rales, normal work of breathing GI: soft, nontender, nondistended, + BS MS: no deformity or atrophy Skin: warm and dry, no rash Neuro:  Strength and sensation are intact Psych: euthymic mood, full affect   EKG:  EKG is not ordered today.    Recent Labs: 12/17/2013: Pro B Natriuretic peptide (BNP) 573.8* 07/09/2014: TSH 3.19 07/10/2014: B Natriuretic Peptide 149.3* 07/11/2014: ALT 13; BUN 9; Creatinine 1.01; Potassium 3.6; Sodium 140 07/16/2014: Hemoglobin 9.9*; Platelets 204.0    Lipid Panel    Component Value Date/Time   CHOL 101 06/04/2014 1523   TRIG 45.0 06/04/2014 1523   HDL 43.10 06/04/2014 1523   CHOLHDL 2 06/04/2014 1523   VLDL 9.0 06/04/2014 1523   LDLCALC 49 06/04/2014 1523      Wt Readings  from Last 3 Encounters:  08/02/14 228 lb 1.9 oz (103.475 kg)  07/19/14 236 lb 1.9 oz (107.103 kg)  07/16/14 238 lb (107.956 kg)      Other studies Reviewed: Additional studies/ records that were reviewed today include: Recent hospitalization of 07/10/14 Review of the above records demonstrates: Hospital records were reviewed.   ASSESSMENT AND PLAN:  1. permanent atrial fibrillation, not a Coumadin candidate because of GI bleed 2. diabetes mellitus 3. chronic diastolic heart failure 4. severe dementia 5. osteoarthritis 6.  Positive occult blood in stool 7.  Recent community-acquired pneumonia requiring  hospitalization 07/10/14.  He responded to Levaquin. 8.  Chronic GI bleeding.  He received 2 units of packed cells in the hospital.  His hemoglobin was 7.1 on admission and the following day was 6.7.  When he went home it was 9.9 after transfusions.   Current medicines are reviewed at length with the patient today.  The patient does not have concerns regarding medicines.  The following changes have been made:  no change  Labs/ tests ordered today include:  No orders of the defined types were placed in this encounter.     Disposition: We will have him keep his previously scheduled visit in July.  We are checking a CBC and a basal metabolic panel today  Signed, Darlin Coco MD 08/02/2014 1:57 PM    [facility] [place], [place], [place]  [address] Phone: [phone]; Fax: [phone]

## 2014-08-05 ENCOUNTER — Telehealth: Payer: Self-pay | Admitting: *Deleted

## 2014-08-05 DIAGNOSIS — I340 Nonrheumatic mitral (valve) insufficiency: Secondary | ICD-10-CM | POA: Diagnosis not present

## 2014-08-05 DIAGNOSIS — G309 Alzheimer's disease, unspecified: Secondary | ICD-10-CM | POA: Diagnosis not present

## 2014-08-05 DIAGNOSIS — I110 Hypertensive heart disease with heart failure: Secondary | ICD-10-CM | POA: Diagnosis not present

## 2014-08-05 DIAGNOSIS — F0280 Dementia in other diseases classified elsewhere without behavioral disturbance: Secondary | ICD-10-CM | POA: Diagnosis not present

## 2014-08-05 DIAGNOSIS — D500 Iron deficiency anemia secondary to blood loss (chronic): Secondary | ICD-10-CM | POA: Diagnosis not present

## 2014-08-05 DIAGNOSIS — I872 Venous insufficiency (chronic) (peripheral): Secondary | ICD-10-CM | POA: Diagnosis not present

## 2014-08-05 DIAGNOSIS — I4891 Unspecified atrial fibrillation: Secondary | ICD-10-CM | POA: Diagnosis not present

## 2014-08-05 DIAGNOSIS — E119 Type 2 diabetes mellitus without complications: Secondary | ICD-10-CM | POA: Diagnosis not present

## 2014-08-05 DIAGNOSIS — Z8701 Personal history of pneumonia (recurrent): Secondary | ICD-10-CM | POA: Diagnosis not present

## 2014-08-05 DIAGNOSIS — I5033 Acute on chronic diastolic (congestive) heart failure: Secondary | ICD-10-CM | POA: Diagnosis not present

## 2014-08-05 NOTE — Telephone Encounter (Signed)
No tikosyn would not be appropriate for his type of atrial fibrillation.

## 2014-08-05 NOTE — Telephone Encounter (Signed)
Advised wife  

## 2014-08-05 NOTE — Telephone Encounter (Signed)
-----   Message from Darlin Coco, MD sent at 08/02/2014  5:35 PM EDT ----- Please report.  Labs are stable.  Hemoglobin is stable at 10.9.  Continue current medication.

## 2014-08-05 NOTE — Telephone Encounter (Signed)
Advised wife of labs  Patient wife knows someone who takes Tikosyn and it has helped them a great deal She was wondering if this would be a drug that may benefit patient  Will forward to  [name] for review

## 2014-08-06 ENCOUNTER — Telehealth: Payer: Self-pay | Admitting: Internal Medicine

## 2014-08-06 DIAGNOSIS — G309 Alzheimer's disease, unspecified: Secondary | ICD-10-CM | POA: Diagnosis not present

## 2014-08-06 DIAGNOSIS — F0280 Dementia in other diseases classified elsewhere without behavioral disturbance: Secondary | ICD-10-CM | POA: Diagnosis not present

## 2014-08-06 DIAGNOSIS — Z8701 Personal history of pneumonia (recurrent): Secondary | ICD-10-CM | POA: Diagnosis not present

## 2014-08-06 DIAGNOSIS — E119 Type 2 diabetes mellitus without complications: Secondary | ICD-10-CM | POA: Diagnosis not present

## 2014-08-06 DIAGNOSIS — I5033 Acute on chronic diastolic (congestive) heart failure: Secondary | ICD-10-CM | POA: Diagnosis not present

## 2014-08-06 DIAGNOSIS — I110 Hypertensive heart disease with heart failure: Secondary | ICD-10-CM | POA: Diagnosis not present

## 2014-08-06 NOTE — Telephone Encounter (Signed)
Patient is progress well would like additional visits.  Requesting verbal orders for 2x week for 3 weeks.

## 2014-08-06 NOTE — Telephone Encounter (Signed)
Called and left a message with verbal orders to approve home health, per Dr. Doug Sou.

## 2014-08-07 DIAGNOSIS — F0280 Dementia in other diseases classified elsewhere without behavioral disturbance: Secondary | ICD-10-CM | POA: Diagnosis not present

## 2014-08-07 DIAGNOSIS — I110 Hypertensive heart disease with heart failure: Secondary | ICD-10-CM | POA: Diagnosis not present

## 2014-08-07 DIAGNOSIS — Z8701 Personal history of pneumonia (recurrent): Secondary | ICD-10-CM | POA: Diagnosis not present

## 2014-08-07 DIAGNOSIS — E119 Type 2 diabetes mellitus without complications: Secondary | ICD-10-CM | POA: Diagnosis not present

## 2014-08-07 DIAGNOSIS — I5033 Acute on chronic diastolic (congestive) heart failure: Secondary | ICD-10-CM | POA: Diagnosis not present

## 2014-08-07 DIAGNOSIS — G309 Alzheimer's disease, unspecified: Secondary | ICD-10-CM | POA: Diagnosis not present

## 2014-08-08 DIAGNOSIS — G309 Alzheimer's disease, unspecified: Secondary | ICD-10-CM | POA: Diagnosis not present

## 2014-08-08 DIAGNOSIS — Z8701 Personal history of pneumonia (recurrent): Secondary | ICD-10-CM | POA: Diagnosis not present

## 2014-08-08 DIAGNOSIS — E119 Type 2 diabetes mellitus without complications: Secondary | ICD-10-CM | POA: Diagnosis not present

## 2014-08-08 DIAGNOSIS — F0280 Dementia in other diseases classified elsewhere without behavioral disturbance: Secondary | ICD-10-CM | POA: Diagnosis not present

## 2014-08-08 DIAGNOSIS — I110 Hypertensive heart disease with heart failure: Secondary | ICD-10-CM | POA: Diagnosis not present

## 2014-08-08 DIAGNOSIS — I5033 Acute on chronic diastolic (congestive) heart failure: Secondary | ICD-10-CM | POA: Diagnosis not present

## 2014-08-09 DIAGNOSIS — G309 Alzheimer's disease, unspecified: Secondary | ICD-10-CM | POA: Diagnosis not present

## 2014-08-09 DIAGNOSIS — E119 Type 2 diabetes mellitus without complications: Secondary | ICD-10-CM | POA: Diagnosis not present

## 2014-08-09 DIAGNOSIS — I5033 Acute on chronic diastolic (congestive) heart failure: Secondary | ICD-10-CM | POA: Diagnosis not present

## 2014-08-09 DIAGNOSIS — F0280 Dementia in other diseases classified elsewhere without behavioral disturbance: Secondary | ICD-10-CM | POA: Diagnosis not present

## 2014-08-09 DIAGNOSIS — Z8701 Personal history of pneumonia (recurrent): Secondary | ICD-10-CM | POA: Diagnosis not present

## 2014-08-09 DIAGNOSIS — I110 Hypertensive heart disease with heart failure: Secondary | ICD-10-CM | POA: Diagnosis not present

## 2014-08-12 ENCOUNTER — Other Ambulatory Visit: Payer: Self-pay | Admitting: Neurology

## 2014-08-12 ENCOUNTER — Telehealth: Payer: Self-pay | Admitting: Internal Medicine

## 2014-08-12 ENCOUNTER — Other Ambulatory Visit: Payer: Self-pay | Admitting: Geriatric Medicine

## 2014-08-12 MED ORDER — SOLIFENACIN SUCCINATE 5 MG PO TABS: 5.0000 mg | ORAL_TABLET | Freq: Every day | ORAL | Status: DC

## 2014-08-12 NOTE — Telephone Encounter (Signed)
Unable to speak to patient's son because he is not listed as someone to speak with. He wanted to discuss the patient's uncontrolled temper and picking and scratching. I explained that the only advice I could give is to have him come in for an office visit because I was not able to speak with him.

## 2014-08-12 NOTE — Telephone Encounter (Signed)
Patients wife called and wanted to talk with you about his medication. Please call her

## 2014-08-12 NOTE — Telephone Encounter (Signed)
Sent 90/day supply to [facility]. Patient aware.

## 2014-08-12 NOTE — Telephone Encounter (Signed)
Spoke with patient's wife about refills.

## 2014-08-12 NOTE — Telephone Encounter (Signed)
Pt son called in to see if refill for solifenacin (VESICARE) 5 MG tablet [[id number]]  To [facility] for a 90 day supply.    Pt son said that he needed to talk to nurse about some of the meds that pt is taking   Best number [name]

## 2014-08-13 DIAGNOSIS — F0280 Dementia in other diseases classified elsewhere without behavioral disturbance: Secondary | ICD-10-CM | POA: Diagnosis not present

## 2014-08-13 DIAGNOSIS — Z8701 Personal history of pneumonia (recurrent): Secondary | ICD-10-CM | POA: Diagnosis not present

## 2014-08-13 DIAGNOSIS — G309 Alzheimer's disease, unspecified: Secondary | ICD-10-CM | POA: Diagnosis not present

## 2014-08-13 DIAGNOSIS — I110 Hypertensive heart disease with heart failure: Secondary | ICD-10-CM | POA: Diagnosis not present

## 2014-08-13 DIAGNOSIS — I5033 Acute on chronic diastolic (congestive) heart failure: Secondary | ICD-10-CM | POA: Diagnosis not present

## 2014-08-13 DIAGNOSIS — E119 Type 2 diabetes mellitus without complications: Secondary | ICD-10-CM | POA: Diagnosis not present

## 2014-08-15 ENCOUNTER — Telehealth: Payer: Self-pay | Admitting: Internal Medicine

## 2014-08-15 DIAGNOSIS — Z8701 Personal history of pneumonia (recurrent): Secondary | ICD-10-CM | POA: Diagnosis not present

## 2014-08-15 DIAGNOSIS — G309 Alzheimer's disease, unspecified: Secondary | ICD-10-CM | POA: Diagnosis not present

## 2014-08-15 DIAGNOSIS — F0280 Dementia in other diseases classified elsewhere without behavioral disturbance: Secondary | ICD-10-CM | POA: Diagnosis not present

## 2014-08-15 DIAGNOSIS — I5033 Acute on chronic diastolic (congestive) heart failure: Secondary | ICD-10-CM | POA: Diagnosis not present

## 2014-08-15 DIAGNOSIS — I110 Hypertensive heart disease with heart failure: Secondary | ICD-10-CM | POA: Diagnosis not present

## 2014-08-15 DIAGNOSIS — E119 Type 2 diabetes mellitus without complications: Secondary | ICD-10-CM | POA: Diagnosis not present

## 2014-08-15 NOTE — Telephone Encounter (Signed)
PLEASE NOTE: All timestamps contained within this report are represented as [place] Standard Time. CONFIDENTIALTY NOTICE: This fax transmission is intended only for the addressee. It contains information that is legally privileged, confidential or otherwise protected from use or disclosure. If you are not the intended recipient, you are strictly prohibited from reviewing, disclosing, copying using or disseminating any of this information or taking any action in reliance on or regarding this information. If you have received this fax in error, please notify us immediately by telephone so that we can arrange for its return to [place]. Phone: [phone], Toll-Free: [phone], Fax: [phone] Page: 1 of 1 Call Id: [id number] [place] [facility] - Client [place] Patient Name: [name] DOB: 1927-12-02 Initial Comment Delena Serve with [facility], patient has been itching for weeks now, Nurse Assessment Nurse: Erlene Quan RN, Manuela Schwartz Date/Time Eilene Ghazi Time): 08/15/2014 2:20:04 PM Confirm and document reason for call. If symptomatic, describe symptoms. ---Delena Serve with [facility], patient has been itching for weeks now - states will need to talk to his wife regarding appointment and more information about the itching - sopoke wiht Mrs. Sedor she states Mr. Janczak has Alzheimer's and he just scratches himself raw - it is compulsive and he does it constantly until he bleeds - they have tried Aveno - cortisone cream - the Dr prescribed Atarax and Allegra nothing is helping - no rash - they have not started using anything new she is very careful about keeping him on his same routine and same products and foods Has the patient traveled out of the country within the last 30 days? ---Not Applicable Does the patient require triage? ---Yes Related visit to physician within the last 2 weeks? ---No Does the PT have any chronic conditions? (i.e.  diabetes, asthma, etc.) ---Yes Guidelines Guideline Title Affirmed Question Affirmed Notes Itching - Widespread [1] SEVERE widespread itching (i.e., interferes with sleep, normal activities or school) AND [2] not improved after 24 hours of itching Care Advice Final Disposition User See Physician within [place], RN, Manuela Schwartz Comments no appointments available at [facility] clinic - wife states she saw Dr Maudie Mercury at [facility] last week and she was very pleased, she would be happy if we can get her husband in to see her - appointment made with Dr Maudie Mercury at 4:00 pm tomorrow 08/16/14

## 2014-08-16 ENCOUNTER — Ambulatory Visit (INDEPENDENT_AMBULATORY_CARE_PROVIDER_SITE_OTHER): Payer: Medicare Other | Admitting: Family Medicine

## 2014-08-16 ENCOUNTER — Telehealth: Payer: Self-pay | Admitting: Internal Medicine

## 2014-08-16 VITALS — BP 140/80 | Temp 97.6°F | Wt 223.0 lb

## 2014-08-16 DIAGNOSIS — I110 Hypertensive heart disease with heart failure: Secondary | ICD-10-CM | POA: Diagnosis not present

## 2014-08-16 DIAGNOSIS — E119 Type 2 diabetes mellitus without complications: Secondary | ICD-10-CM | POA: Diagnosis not present

## 2014-08-16 DIAGNOSIS — L853 Xerosis cutis: Secondary | ICD-10-CM | POA: Diagnosis not present

## 2014-08-16 DIAGNOSIS — Z8701 Personal history of pneumonia (recurrent): Secondary | ICD-10-CM | POA: Diagnosis not present

## 2014-08-16 DIAGNOSIS — I5033 Acute on chronic diastolic (congestive) heart failure: Secondary | ICD-10-CM | POA: Diagnosis not present

## 2014-08-16 DIAGNOSIS — G309 Alzheimer's disease, unspecified: Secondary | ICD-10-CM | POA: Diagnosis not present

## 2014-08-16 DIAGNOSIS — F0280 Dementia in other diseases classified elsewhere without behavioral disturbance: Secondary | ICD-10-CM | POA: Diagnosis not present

## 2014-08-16 MED ORDER — TRIAMCINOLONE ACETONIDE 0.1 % EX CREA: 1.0000 "application " | TOPICAL_CREAM | Freq: Two times a day (BID) | CUTANEOUS | Status: DC

## 2014-08-16 NOTE — Telephone Encounter (Signed)
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 Can I sch?

## 2014-08-16 NOTE — Progress Notes (Signed)
Pre visit review using our clinic review tool, if applicable. No additional management support is needed unless otherwise documented below in the visit note. 

## 2014-08-16 NOTE — Patient Instructions (Signed)
CERAVE CREAM (comes in a tub) over the counter twice daily to arms routinely  triamcinilone cream 1-2 times daily to itchy areas for 1 week with flares - avoid application to face or skin creases  Antibiotic ointment on any areas that bleed for 3-5 days  Follow up with your primary doctor in 2-4 weeks or as needed

## 2014-08-16 NOTE — Progress Notes (Signed)
HPI:  [name] is a very pleasant 81 yo M pt of Dr. Doug [name] here for an acute visit for:  Pruritis: -has been ongoing for some time -has dry itchy skin -mainly on arms, steroids help, atarax did not help -occasional will scratch until he forms a papule and it bleeds  -denies lesions elsewhere, swelling, pain   ROS: See pertinent positives and negatives per HPI.  Past Medical History  Diagnosis Date  . Arrhythmia     a fib  . ED (erectile dysfunction)   . Memory disorder   . 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 Hematochezia     has intermittent problem  . Arthritis     DJD knee - left  . Restless leg     treats with heat  . Cancer     bladder cancer - BCG Dr. Lawerance [name]  . Diabetes mellitus     NIDDM on oral medication  . Genital warts     recurrent. Not a problem  . GERD (gastroesophageal reflux disease)     treated with PPI  . Hypertension     mild  . Neuropathy     burning discomfort feet/legs  . Irritable bladder     Past Surgical History  Procedure Laterality Date  . Cardiovascular stress test  03/28/2003    EF 55%.  Normal cardiolite study  . Appendectomy  1946  . Tonsillectomy  1938  . Joint replacement  1990's    left TKR  . Knee arthroscopy  1980's    right knee    Family History  Problem Relation Age of Onset  . Kidney disease Mother   . Heart disease Mother   . Stroke Mother   . Cancer Father     prostate  . Diabetes Neg Hx   . COPD Neg Hx   . Mental illness Brother     History   Social History  . Marital Status: Married    Spouse Name: [name]  . Number of Children: 4  . Years of Education: 10th    Occupational History  . retired    Social History Main Topics  . Smoking status: Former Smoker    Quit date: 08/28/1975  . Smokeless tobacco: Former Systems developer  . Alcohol Use: 4.2 oz/week    7 Shots of liquor per week  . Drug Use: No  . Sexual Activity: Not Currently   Other Topics Concern  . Not on file   Social  History Narrative   Patient lives at home with his wife [name]) Married 67 years.   Retired   Education 10 th grade   Right handed   Caffeine one or two daily      Current outpatient prescriptions:  .  aspirin 81 MG tablet, Take 81 mg by mouth daily., Disp: , Rfl:  .  donepezil (ARICEPT) 10 MG tablet, Take 1 tablet (10 mg total) by mouth at bedtime., Disp: 90 tablet, Rfl: 3 .  ferrous sulfate 325 (65 FE) MG tablet, Take 325 mg by mouth 2 (two) times daily. , Disp: , Rfl:  .  fexofenadine (ALLEGRA) 180 MG tablet, Take 1 tablet (180 mg total) by mouth daily., Disp: 30 tablet, Rfl: 2 .  folic acid (FOLVITE) 1 MG tablet, Take 1 mg by mouth daily., Disp: , Rfl:  .  furosemide (LASIX) 40 MG tablet, Take 1 tablet (40 mg total) by mouth every morning., Disp: 135 tablet, Rfl: 3 .  gabapentin (NEURONTIN) 100 MG capsule, Take 100 mg by mouth at bedtime., Disp: , Rfl:  .  hydrOXYzine (ATARAX/VISTARIL) 10 MG tablet, Take 10 mg by mouth 2 (two) times daily as needed (for anxiety or allergies)., Disp: , Rfl:  .  Memantine HCl ER (NAMENDA XR) 28 MG CP24, Take 28 mg by mouth daily., Disp: 90 capsule, Rfl: 4 .  metoprolol tartrate (LOPRESSOR) 25 MG tablet, Take 12.5 mg by mouth daily., Disp: , Rfl:  .  nitroGLYCERIN (NITROSTAT) 0.4 MG SL tablet, Place 1 tablet (0.4 mg total) under the tongue every 5 (five) minutes as needed for chest pain., Disp: 100 tablet, Rfl: 0 .  solifenacin (VESICARE) 5 MG tablet, Take 1 tablet (5 mg total) by mouth daily., Disp: 90 tablet, Rfl: 3 .  VESICARE 5 MG tablet, TAKE 1 BY MOUTH DAILY, Disp: 30 tablet, Rfl: 0 .  triamcinolone cream (KENALOG) 0.1 %, Apply 1 application topically 2 (two) times daily., Disp: 30 g, Rfl: 0  EXAM:  Filed Vitals:   08/16/14 1608  BP: 140/80  Temp: 97.6 F (36.4 C)    Body mass index is 27.87 kg/(m^2).  GENERAL: vitals reviewed and listed above, alert, oriented, appears well hydrated and in no acute distress  HEENT: atraumatic, conjunttiva  clear, no obvious abnormalities on inspection of external nose and ears  NECK: no obvious masses on inspection  MS: moves all extremities without noticeable abnormality  SKIN: dry skin on arms, one small scabbed papule on R upper arm - no signs of infection  PSYCH: pleasant and cooperative, no obvious depression or anxiety  ASSESSMENT AND PLAN:  Discussed the following assessment and plan:  Xerosis of skin - Plan: triamcinolone cream (KENALOG) 0.1 %  -emmollient with ceramide advised -topical steroid for 1 week bid with flares -avoid scratching if possible -topical antibiotic for any eroded areas for 3-5 days -follow up with PCP in 2-4 weeks -Patient advised to return or notify a doctor immediately if symptoms  worsen or persist or new concerns arise.  Patient Instructions  CERAVE CREAM (comes in a tub) over the counter twice daily to arms routinely  triamcinilone cream 1-2 times daily to itchy areas for 1 week with flares - avoid application to face or skin creases  Antibiotic ointment on any areas that bleed for 3-5 days  Follow up with your primary doctor in 2-4 weeks or as needed     [name] R.

## 2014-08-19 NOTE — Telephone Encounter (Signed)
That is fine 

## 2014-08-20 ENCOUNTER — Telehealth: Payer: Self-pay | Admitting: Internal Medicine

## 2014-08-20 DIAGNOSIS — I5033 Acute on chronic diastolic (congestive) heart failure: Secondary | ICD-10-CM | POA: Diagnosis not present

## 2014-08-20 DIAGNOSIS — G309 Alzheimer's disease, unspecified: Secondary | ICD-10-CM | POA: Diagnosis not present

## 2014-08-20 DIAGNOSIS — Z8701 Personal history of pneumonia (recurrent): Secondary | ICD-10-CM | POA: Diagnosis not present

## 2014-08-20 DIAGNOSIS — I110 Hypertensive heart disease with heart failure: Secondary | ICD-10-CM | POA: Diagnosis not present

## 2014-08-20 DIAGNOSIS — E119 Type 2 diabetes mellitus without complications: Secondary | ICD-10-CM | POA: Diagnosis not present

## 2014-08-20 DIAGNOSIS — F0280 Dementia in other diseases classified elsewhere without behavioral disturbance: Secondary | ICD-10-CM | POA: Diagnosis not present

## 2014-08-20 NOTE — Telephone Encounter (Signed)
Patient is progressing well in PT. Requesting verbal orders to continue one time per week for three weeks.

## 2014-08-21 ENCOUNTER — Telehealth: Payer: Self-pay | Admitting: *Deleted

## 2014-08-21 NOTE — Telephone Encounter (Signed)
[place] [facility] - Client [place] [facility] Patient Name: ISABEL FREESE Gender: Male DOB: 28-Dec-1927 Age: 81 Y 3 M 27 D Return Phone Number: Address: City/State/Zip: [place] Client [place] [facility] - Client Client Site [place] Physician [place], [place] Type Call Call Type Triage / [place] Name [phone] Relationship To Patient Provider Appointment Disposition EMR Appointment Scheduled Info pasted into Epic Yes Return Phone Number Please choose phone number Chief Complaint Rash - Widespread Initial Comment [place] with [place], patient has been itching for weeks now, PreDisposition Call Doctor Nurse Assessment Nurse: Erlene Quan, RN, Manuela Schwartz Date/Time Eilene Ghazi Time): 08/15/2014 2:20:04 PM Confirm and document reason for call. If symptomatic, describe symptoms. ---Delena Serve with [facility], patient has been itching for weeks now - states will need to talk to his wife regarding appointment and more information about the itching - sopoke wiht Mrs. Wisz she states Mr. Parisi has Alzheimer's and he just scratches himself raw - it is compulsive and he does it constantly until he bleeds - they have tried Aveno - cortisone cream - the Dr prescribed Atarax and Allegra nothing is helping - no rash - they have not started using anything new she is very careful about keeping him on his same routine and same products and foods Has the patient traveled out of the country within the last 30 days? ---Not Applicable Does the patient require triage? ---Yes Related visit to physician within the last 2 weeks? ---No Does the PT have any chronic conditions? (i.e. diabetes, asthma, etc.) ---Yes Guidelines Guideline Title Affirmed Question Affirmed Notes Nurse Date/Time (Eastern Time) Itching - Widespread [1] SEVERE widespread itching (i.e., interferes with sleep, normal activities  or school) AND [2] not improved after 24 hours of itching Care Advice Macario Carls 08/15/2014 2:07:10 PM Disp. Time Eilene Ghazi Time) Disposition Final User PLEASE NOTE: All timestamps contained within this report are represented as [place] Standard Time. CONFIDENTIALTY NOTICE: This fax transmission is intended only for the addressee. It contains information that is legally privileged, confidential or otherwise protected from use or disclosure. If you are not the intended recipient, you are strictly prohibited from reviewing, disclosing, copying using or disseminating any of this information or taking any action in reliance on or regarding this information. If you have received this fax in error, please notify us immediately by telephone so that we can arrange for its return to [place]. Phone: [phone], Toll-Free: [phone], Fax: [phone] Page: 2 of 2 Call Id: [id number] 08/15/2014 2:16:57 PM See Physician within 24 Hours Yes Erlene Quan, RN, Edwena Bunde Understands: Yes Disagree/Comply: Comply Care Advice Given Per Guideline SEE PHYSICIAN WITHIN 24 HOURS: MOISTURIZE THE SKIN WITH LOTION: * The best time to apply lotion is right after a bath or shower when the skin is moist. The lotion or cream will help seal in the moisture. * Eucerin lotion, Lubriderm lotion, or Vaseline Intensive Care lotion all work well. * Eucerin Creme is a little thicker and is especially helpful for dry/chapped hands. * Vaseline ointment (vaseline petroleum jelly) is inexpensive. It has the drawback of feeling somewhat greasy; this can be lessened by using only small amounts and rubbing it in thoroughly. COOL BATH FOR FLARE-UP: * For flare-ups of itching, take a cool bath without soap for 15 minutes, 1-2 times per day. Pat dry using towel - do not rub. * Another option is an Database administrator Designer, television/film set) [employer]. Sprinkle contents of one packet under running  faucet. (Caution: this can make bathtub slippery.) CALL BACK IF: * You  become worse. CARE ADVICE given per Itching, Widespread (Adult) guideline. After Care Instructions Given Call Event Type User Date / Time Description Comments User: Clint Guy, RN Date/Time Eilene Ghazi Time): 08/15/2014 2:26:03 PM no appointments available at [facility] clinic - wife states she saw Dr Maudie Mercury at [facility] last week and she was very pleased, she would be happy if we can get her husband in to see her - appointment made with Dr Maudie Mercury at 4:00 pm tomorrow 08/16/14 Referrals REFERRED TO PCP OFFICE

## 2014-08-21 NOTE — Telephone Encounter (Signed)
Left message on voice mail with verbal orders for PT, once a week for 3 weeks.

## 2014-08-22 DIAGNOSIS — Z8701 Personal history of pneumonia (recurrent): Secondary | ICD-10-CM | POA: Diagnosis not present

## 2014-08-22 DIAGNOSIS — I5033 Acute on chronic diastolic (congestive) heart failure: Secondary | ICD-10-CM | POA: Diagnosis not present

## 2014-08-22 DIAGNOSIS — G309 Alzheimer's disease, unspecified: Secondary | ICD-10-CM | POA: Diagnosis not present

## 2014-08-22 DIAGNOSIS — E119 Type 2 diabetes mellitus without complications: Secondary | ICD-10-CM | POA: Diagnosis not present

## 2014-08-22 DIAGNOSIS — I110 Hypertensive heart disease with heart failure: Secondary | ICD-10-CM | POA: Diagnosis not present

## 2014-08-22 DIAGNOSIS — F0280 Dementia in other diseases classified elsewhere without behavioral disturbance: Secondary | ICD-10-CM | POA: Diagnosis not present

## 2014-08-23 NOTE — Telephone Encounter (Signed)
S/w with pt wife pt has be scheduled

## 2014-08-27 DIAGNOSIS — Z8701 Personal history of pneumonia (recurrent): Secondary | ICD-10-CM | POA: Diagnosis not present

## 2014-08-27 DIAGNOSIS — G309 Alzheimer's disease, unspecified: Secondary | ICD-10-CM | POA: Diagnosis not present

## 2014-08-27 DIAGNOSIS — I5033 Acute on chronic diastolic (congestive) heart failure: Secondary | ICD-10-CM | POA: Diagnosis not present

## 2014-08-27 DIAGNOSIS — I110 Hypertensive heart disease with heart failure: Secondary | ICD-10-CM | POA: Diagnosis not present

## 2014-08-27 DIAGNOSIS — F0280 Dementia in other diseases classified elsewhere without behavioral disturbance: Secondary | ICD-10-CM | POA: Diagnosis not present

## 2014-08-27 DIAGNOSIS — E119 Type 2 diabetes mellitus without complications: Secondary | ICD-10-CM | POA: Diagnosis not present

## 2014-09-02 DIAGNOSIS — Z8701 Personal history of pneumonia (recurrent): Secondary | ICD-10-CM | POA: Diagnosis not present

## 2014-09-02 DIAGNOSIS — I5033 Acute on chronic diastolic (congestive) heart failure: Secondary | ICD-10-CM | POA: Diagnosis not present

## 2014-09-02 DIAGNOSIS — F0280 Dementia in other diseases classified elsewhere without behavioral disturbance: Secondary | ICD-10-CM | POA: Diagnosis not present

## 2014-09-02 DIAGNOSIS — E119 Type 2 diabetes mellitus without complications: Secondary | ICD-10-CM | POA: Diagnosis not present

## 2014-09-02 DIAGNOSIS — I110 Hypertensive heart disease with heart failure: Secondary | ICD-10-CM | POA: Diagnosis not present

## 2014-09-02 DIAGNOSIS — G309 Alzheimer's disease, unspecified: Secondary | ICD-10-CM | POA: Diagnosis not present

## 2014-09-03 ENCOUNTER — Telehealth: Payer: Self-pay | Admitting: Internal Medicine

## 2014-09-03 NOTE — Telephone Encounter (Signed)
PT currently seeing patient 1 time a week.  Patient has one visit remaining.  Plain for week of 6/20.  However primary therapist is on vacation that week.  Family and therapist request rescheduling that visit for the week of the 27th.

## 2014-09-03 NOTE — Telephone Encounter (Signed)
Fine with me

## 2014-09-03 NOTE — Telephone Encounter (Signed)
Notified Jim. PT with md response...[name]

## 2014-09-16 DIAGNOSIS — I5033 Acute on chronic diastolic (congestive) heart failure: Secondary | ICD-10-CM | POA: Diagnosis not present

## 2014-09-16 DIAGNOSIS — F0280 Dementia in other diseases classified elsewhere without behavioral disturbance: Secondary | ICD-10-CM | POA: Diagnosis not present

## 2014-09-16 DIAGNOSIS — E119 Type 2 diabetes mellitus without complications: Secondary | ICD-10-CM | POA: Diagnosis not present

## 2014-09-16 DIAGNOSIS — G309 Alzheimer's disease, unspecified: Secondary | ICD-10-CM | POA: Diagnosis not present

## 2014-09-16 DIAGNOSIS — I110 Hypertensive heart disease with heart failure: Secondary | ICD-10-CM | POA: Diagnosis not present

## 2014-09-16 DIAGNOSIS — Z8701 Personal history of pneumonia (recurrent): Secondary | ICD-10-CM | POA: Diagnosis not present

## 2014-10-03 ENCOUNTER — Ambulatory Visit: Payer: Medicare Other | Admitting: Cardiology

## 2014-10-08 ENCOUNTER — Other Ambulatory Visit: Payer: Self-pay | Admitting: Internal Medicine

## 2014-10-09 ENCOUNTER — Ambulatory Visit: Payer: Medicare Other | Admitting: Adult Health

## 2014-10-17 ENCOUNTER — Other Ambulatory Visit: Payer: Self-pay | Admitting: Internal Medicine

## 2014-10-17 DIAGNOSIS — R918 Other nonspecific abnormal finding of lung field: Secondary | ICD-10-CM

## 2014-10-18 ENCOUNTER — Ambulatory Visit (INDEPENDENT_AMBULATORY_CARE_PROVIDER_SITE_OTHER): Payer: Medicare Other | Admitting: Adult Health

## 2014-10-18 ENCOUNTER — Encounter: Payer: Self-pay | Admitting: Adult Health

## 2014-10-18 VITALS — BP 110/72 | Temp 97.6°F | Ht 75.0 in | Wt 227.8 lb

## 2014-10-18 DIAGNOSIS — H9193 Unspecified hearing loss, bilateral: Secondary | ICD-10-CM

## 2014-10-18 DIAGNOSIS — Z7189 Other specified counseling: Secondary | ICD-10-CM

## 2014-10-18 DIAGNOSIS — R32 Unspecified urinary incontinence: Secondary | ICD-10-CM

## 2014-10-18 DIAGNOSIS — Z7689 Persons encountering health services in other specified circumstances: Secondary | ICD-10-CM

## 2014-10-18 DIAGNOSIS — Z23 Encounter for immunization: Secondary | ICD-10-CM | POA: Diagnosis not present

## 2014-10-18 NOTE — Progress Notes (Signed)
HPI:  [name] is here to establish care. He is a very pleasant 81 year old with significant health history.  has a past medical history of Arrhythmia; ED (erectile dysfunction); Memory disorder; Anemia; Hematochezia; Arthritis; Restless leg; Cancer; Diabetes mellitus; Genital warts; GERD (gastroesophageal reflux disease); Hypertension; Neuropathy; Irritable bladder; and Hard of hearing. He presents with his wife.   Last PCP and physical:02/03/2014 Immunizations:Needs Tdap Diet: Eats heart healthy Exercise: Walks, but does not do any other exercise.  Colonoscopy: 2010 Dentist: Has dentures Eye Doctor: Yearly  Follows up with Cardiology every four months Follows up with Urology yearly.  Follows up with Neurology yearly   Has the following chronic problems that require follow up and concerns today:  Incontinence - He has been having increasing incontinence, he is having trouble making it to the restroom before he needs to urinate. This does not happen every time. Has been an on going issue for the last " few months."   He is hard of hearing but does not wear his hearing aids due to "they are too loud".   Review of Systems  Has had one fall this year, that did not result in injury. Constitutional: Negative for fever, activity change, appetite change and fatigue.  Respiratory: Negative for cough, chest tightness, shortness of breath and wheezing.  Cardiovascular: Positive for leg swelling. Negative for chest pain and palpitations.  Gastrointestinal: Negative for abdominal pain, diarrhea, constipation and abdominal distention.  Musculoskeletal: Positive for gait problem. Negative for myalgias and arthralgias.  Neurological: Positive for weakness. Negative for dizziness, light-headedness and headaches.    Past Medical History  Diagnosis Date  . Arrhythmia     a fib  . ED (erectile dysfunction)   . Memory disorder   . 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  Hematochezia     has intermittent problem  . Arthritis     DJD knee - left  . Restless leg     treats with heat  . Cancer     bladder cancer - BCG [name]  . Diabetes mellitus     NIDDM on oral medication  . Genital warts     recurrent. Not a problem  . GERD (gastroesophageal reflux disease)     treated with PPI  . Hypertension     mild  . Neuropathy     burning discomfort feet/legs  . Irritable bladder     Past Surgical History  Procedure Laterality Date  . Cardiovascular stress test  03/28/2003    EF 55%.  Normal cardiolite study  . Appendectomy  1946  . Tonsillectomy  1938  . Joint replacement  1990's    left TKR  . Knee arthroscopy  1980's    right knee    Family History  Problem Relation Age of Onset  . Kidney disease Mother   . Heart disease Mother   . Stroke Mother   . Cancer Father     prostate  . Diabetes Neg Hx   . COPD Neg Hx   . Mental illness Brother     History   Social History  . Marital Status: Married    Spouse Name: [name]  . Number of Children: 4  . Years of Education: 10th    Occupational History  . retired    Social History Main Topics  . Smoking status: Former Smoker    Quit date: 08/28/1975  . Smokeless tobacco: Former Systems developer  . Alcohol  Use: 4.2 oz/week    7 Shots of liquor per week  . Drug Use: No  . Sexual Activity: Not Currently   Other Topics Concern  . Not on file   Social History Narrative   Patient lives at home with his wife [name]) Married 63 years.   Retired   Education 10 th grade   Right handed   Caffeine one or two daily      Current outpatient prescriptions:  .  aspirin 81 MG tablet, Take 81 mg by mouth daily., Disp: , Rfl:  .  donepezil (ARICEPT) 10 MG tablet, Take 1 tablet (10 mg total) by mouth at bedtime., Disp: 90 tablet, Rfl: 3 .  ferrous sulfate 325 (65 FE) MG tablet, Take 325 mg by mouth 2 (two) times daily. , Disp: , Rfl:  .  fexofenadine (ALLEGRA) 180 MG tablet, Take 1 tablet (180 mg  total) by mouth daily., Disp: 30 tablet, Rfl: 2 .  folic acid (FOLVITE) 1 MG tablet, Take 1 mg by mouth daily., Disp: , Rfl:  .  furosemide (LASIX) 40 MG tablet, Take 1 tablet (40 mg total) by mouth every morning., Disp: 135 tablet, Rfl: 3 .  gabapentin (NEURONTIN) 100 MG capsule, Take 100 mg by mouth at bedtime., Disp: , Rfl:  .  hydrOXYzine (ATARAX/VISTARIL) 10 MG tablet, TAKE (1) TABLET TWICE A DAY AS NEEDED., Disp: 60 tablet, Rfl: 0 .  Memantine HCl ER (NAMENDA XR) 28 MG CP24, Take 28 mg by mouth daily., Disp: 90 capsule, Rfl: 4 .  metoprolol tartrate (LOPRESSOR) 25 MG tablet, Take 12.5 mg by mouth daily., Disp: , Rfl:  .  nitroGLYCERIN (NITROSTAT) 0.4 MG SL tablet, Place 1 tablet (0.4 mg total) under the tongue every 5 (five) minutes as needed for chest pain., Disp: 100 tablet, Rfl: 0 .  solifenacin (VESICARE) 5 MG tablet, Take 1 tablet (5 mg total) by mouth daily., Disp: 90 tablet, Rfl: 3 .  triamcinolone cream (KENALOG) 0.1 %, Apply 1 application topically 2 (two) times daily., Disp: 30 g, Rfl: 0 .  VESICARE 5 MG tablet, TAKE 1 BY MOUTH DAILY, Disp: 30 tablet, Rfl: 0  EXAM:  There were no vitals filed for this visit.  There is no weight on file to calculate BMI.  GENERAL: vitals reviewed and listed above, alert, oriented, appears well hydrated and in no acute distress.   HEENT: atraumatic, conjunttiva clear, no obvious abnormalities on inspection of external nose and ears. TM's visualized. Minimal cerumen impaction. Wears glasses and has both upper and lower dentures.   NECK: Neck is soft and supple without masses, no adenopathy or thyromegaly, trachea midline, no JVD. Normal range of motion.   LUNGS: clear to auscultation bilaterally, no wheezes, rales or rhonchi, good air movement  CV: Regular rate and rhythm, normal S1/S2, no audible murmurs, gallops, or rubs. No carotid bruit and no peripheral edema.   MS: moves all extremities without noticeable abnormality. He exhibits edema  1-2+ edema in the legs bilaterally and hardened skin around the ankles bilaterally with some color change suggesting chronic congestion.  Uses walker   Abd: soft/nontender/nondistended/normal bowel sounds   Skin: warm and dry, no rash   Extremities: No clubbing, cyanosis, or edema. Capillary refill is WNL. Pulses intact bilaterally in upper and lower extremities.   Neuro: CN II-XII intact, sensation and reflexes normal throughout, 5/5 muscle strength in bilateral upper and lower extremities. Normal finger to nose. Normal rapid alternating movements. Normal romberg. No pronator drift.   PSYCH:  pleasant and cooperative, no obvious depression or anxiety  ASSESSMENT AND PLAN:  1. Encounter to establish care - Follow up in November for CPE - Follow up sooner if needed - He has many chronic issues that will require additional visits. He is aware of this.   2. Need for prophylactic vaccination against Streptococcus pneumoniae (pneumococcus) - Pneumococcal polysaccharide vaccine 23-valent greater than or equal to 2yo subcutaneous/IM  3. Hard of hearing, bilateral - Ambulatory referral to Audiology  4. Incontinence - He has an upcoming appointment with his urologist. He would prefer that the urologist deal with his urinary incontinence issues.   -We reviewed the PMH, PSH, FH, SH, Meds and Allergies. -We provided refills for any medications we will prescribe as needed. -We addressed current concerns per orders and patient instructions. -We have asked for records for pertinent exams, studies, vaccines and notes from previous providers. -We have advised patient to follow up per instructions below.   -Patient advised to return or notify a provider immediately if symptoms worsen or persist or new concerns arise.    [name], AGNP

## 2014-10-18 NOTE — Patient Instructions (Signed)
It was great meeting you today!  You had Vesicare 5mg  on your medication list twice. You only need to take 5 mg.   Someone will call you to make an appointment for the hearing doctor.

## 2014-10-28 ENCOUNTER — Ambulatory Visit (INDEPENDENT_AMBULATORY_CARE_PROVIDER_SITE_OTHER)
Admission: RE | Admit: 2014-10-28 | Discharge: 2014-10-28 | Disposition: A | Discharge status: Home / Self Care | Payer: Medicare Other | Source: Ambulatory Visit | Attending: Internal Medicine | Admitting: Internal Medicine

## 2014-10-28 ENCOUNTER — Encounter: Payer: Self-pay | Admitting: Internal Medicine

## 2014-10-28 DIAGNOSIS — J984 Other disorders of lung: Secondary | ICD-10-CM

## 2014-10-28 DIAGNOSIS — R911 Solitary pulmonary nodule: Secondary | ICD-10-CM | POA: Diagnosis not present

## 2014-10-28 DIAGNOSIS — R918 Other nonspecific abnormal finding of lung field: Secondary | ICD-10-CM

## 2014-10-30 ENCOUNTER — Other Ambulatory Visit: Payer: Self-pay | Admitting: Neurology

## 2014-10-30 ENCOUNTER — Other Ambulatory Visit: Payer: Self-pay | Admitting: Cardiology

## 2014-10-31 ENCOUNTER — Other Ambulatory Visit: Payer: Medicare Other

## 2014-10-31 NOTE — Telephone Encounter (Signed)
Has appt in Sept

## 2014-11-13 ENCOUNTER — Other Ambulatory Visit: Payer: Self-pay | Admitting: Adult Health

## 2014-11-13 NOTE — Telephone Encounter (Signed)
Pt request refill of the following: hydrOXYzine (ATARAX/VISTARIL) 10 MG tablet   Phamacy: [facility]

## 2014-11-14 ENCOUNTER — Other Ambulatory Visit: Payer: Self-pay

## 2014-11-14 MED ORDER — HYDROXYZINE HCL 10 MG PO TABS: ORAL_TABLET | ORAL | Status: DC

## 2014-11-14 NOTE — Telephone Encounter (Signed)
Rx sent 

## 2014-11-14 NOTE — Telephone Encounter (Signed)
The pt's [place] called and [place] [facility]  [place], [place] - [place]  Pt's daughterJunie Panning [phone]

## 2014-11-29 ENCOUNTER — Ambulatory Visit (INDEPENDENT_AMBULATORY_CARE_PROVIDER_SITE_OTHER): Payer: Medicare Other | Admitting: Cardiology

## 2014-11-29 ENCOUNTER — Encounter: Payer: Self-pay | Admitting: Cardiology

## 2014-11-29 VITALS — BP 118/60 | HR 70 | Ht 75.0 in | Wt 223.8 lb

## 2014-11-29 DIAGNOSIS — I482 Chronic atrial fibrillation, unspecified: Secondary | ICD-10-CM

## 2014-11-29 DIAGNOSIS — I119 Hypertensive heart disease without heart failure: Secondary | ICD-10-CM | POA: Diagnosis not present

## 2014-11-29 NOTE — Progress Notes (Signed)
Cardiology Office Note   Date:  11/29/2014   ID:  [name], DOB 01-06-1928, MRN [id number]  PCP:  [name], NP  Cardiologist: Darlin Coco MD  No chief complaint on file.     History of Present Illness: [name] is a 81 y.o. male who presents for a four-month office visit  [name] is seen following a recent hospitalization for community-acquired pneumonia and severe anemia. He has a past history of chronic established atrial fibrillation. He is not on Coumadin because of recurrent GI bleeds. He also has problems with exogenous obesity and diabetes. Recently he has had progressive weight loss and poor appetite.  Since last visit he has lost 4 more pounds.  However his son states that he has been eating very well.. He had a cardiac catheterization on 08/07/01 showing normal coronary arteries and an ejection fraction of 60-65%. He had an echocardiogram on 04/21/11 showing an ejection fraction of 60-65%, moderate mitral regurgitation, mild aortic insufficiency, moderate tricuspid regurgitation, and pulmonary artery pressure was 46 mmHg. he has mild chronic pretibial edema. He has chronic mild pul monary vascular congestion with basilar rales.  He had a Lexiscan Myoview on 01/23/13 showing no ischemia. Eduardo study was not gated because of his atrial fibrillation. [name] had an echocardiogram 05/30/13 showing an ejection fraction of 55-60%. [name] has worsening dementia. His wife and family are starting to consider a moved to a retirement community for both him and his wife. His last visit [name] denies any chest pain or shortness of breath dizziness or syncope.  [name] denies any awareness of hematochezia or melena.  Past Medical History  Diagnosis Date  . Arrhythmia     a fib  . ED (erectile dysfunction)   . Memory disorder   . 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 Hematochezia     has intermittent  problem  . Arthritis     DJD knee - left  . Restless leg     treats with heat  . Cancer     bladder cancer - BCG [name]  . Diabetes mellitus     NIDDM on oral medication  . Genital warts     recurrent. Not a problem  . GERD (gastroesophageal reflux disease)     treated with PPI  . Hypertension     mild  . Neuropathy     burning discomfort feet/legs  . Irritable bladder   . Hard of hearing     Past Surgical History  Procedure Laterality Date  . Cardiovascular stress test  03/28/2003    EF 55%.  Normal cardiolite study  . Appendectomy  1946  . Tonsillectomy  1938  . Joint replacement  1990's    left TKR  . Knee arthroscopy  1980's    right knee     Current Outpatient Prescriptions  Medication Sig Dispense Refill  . aspirin 81 MG tablet Take 81 mg by mouth daily.    [name] donepezil (ARICEPT) 10 MG tablet Take 1 tablet (10 mg total) by mouth at bedtime. 90 tablet 3  . ferrous sulfate 325 (65 FE) MG tablet Take 325 mg by mouth 2 (two) times daily.     . folic acid (FOLVITE) 1 MG tablet Take 1 mg by mouth daily.    . furosemide (LASIX) 40 MG tablet Take 1 tablet (40 mg total) by mouth every morning. 135 tablet 3  .  gabapentin (NEURONTIN) 100 MG capsule Take 100 mg by mouth at bedtime.    . hydrOXYzine (ATARAX/VISTARIL) 10 MG tablet Take 10 mg by mouth 2 (two) times daily as needed (itching).    . metoprolol tartrate (LOPRESSOR) 25 MG tablet Take 12.5 mg by mouth daily.    . metoprolol tartrate (LOPRESSOR) 25 MG tablet TAKE 1/2 BY MOUTH EVERY MORNING (GENERIC FOR LOPRESSOR) 45 tablet 3  . NAMENDA XR 28 MG CP24 24 hr capsule TAKE 1 BY MOUTH DAILY 90 capsule 0  . nitroGLYCERIN (NITROSTAT) 0.4 MG SL tablet Place 1 tablet (0.4 mg total) under Eduardo tongue every 5 (five) minutes as needed for chest pain. 100 tablet 0  . solifenacin (VESICARE) 5 MG tablet Take 1 tablet (5 mg total) by mouth daily. 90 tablet 3  . triamcinolone cream (KENALOG) 0.1 % Apply 1 application topically 2  (two) times daily. 30 g 0   No current facility-administered medications for Eduardo visit.    Allergies:   Cephalexin; Furacin; Macrodantin; and Zestril    Social History:  [name]  reports that he quit smoking about 39 years ago. He has quit using smokeless tobacco. He reports that he drinks about 4.2 oz of alcohol per week. He reports that he does not use illicit drugs.   Family History:  [name]'s family history includes Cancer in his father; Heart disease in his mother; Kidney disease in his mother; Mental illness in his brother; Stroke in his mother. There is no history of Diabetes or COPD.    ROS:  Please see Eduardo history of present illness.   Otherwise, review of systems are positive for none.   All other systems are reviewed and negative.    PHYSICAL EXAM: VS:  BP 118/60 mmHg  Pulse 70  Ht 6\' 3"  (1.905 m)  Wt 223 lb 12.8 oz (101.515 kg)  BMI 27.97 kg/m2 , BMI Body mass index is 27.97 kg/(m^2). GEN: Well nourished, well developed, in no acute distress HEENT: normal Neck: no JVD, carotid bruits, or masses Cardiac: Irregularly irregular. no murmurs, rubs, or gallops,no edema  Respiratory:  clear to auscultation bilaterally, normal work of breathing GI: soft, nontender, nondistended, + BS MS: no deformity or atrophy Skin: warm and dry, no rash Neuro:  Strength and sensation are intact Psych: euthymic mood, full affect   EKG:  EKG is ordered today. Eduardo ekg ordered today demonstrates atrial fibrillation with controlled ventricular response.  Pulse is 70 bpm   Recent Labs: 12/17/2013: Pro B Natriuretic peptide (BNP) 573.8* 07/09/2014: TSH 3.19 07/10/2014: B Natriuretic Peptide 149.3* 07/11/2014: ALT 13 08/02/2014: BUN 11; Creatinine, Ser 0.85; Hemoglobin 10.9*; Platelets 155.0; Potassium 4.1; Sodium 133*    Lipid Panel    Component Value Date/Time   CHOL 101 06/04/2014 1523   TRIG 45.0 06/04/2014 1523   HDL 43.10 06/04/2014 1523   CHOLHDL 2 06/04/2014 1523   VLDL  9.0 06/04/2014 1523   LDLCALC 49 06/04/2014 1523      Wt Readings from Last 3 Encounters:  11/29/14 223 lb 12.8 oz (101.515 kg)  10/18/14 227 lb 12.8 oz (103.329 kg)  08/16/14 223 lb (101.152 kg)        ASSESSMENT AND PLAN:  1. permanent atrial fibrillation, not a Coumadin candidate because of GI bleed 2. diabetes mellitus 3. chronic diastolic heart failure 4. severe dementia 5. osteoarthritis 6. Positive occult blood in stool 7. Recent community-acquired pneumonia requiring hospitalization 07/10/14. He responded to Levaquin. 8. Chronic GI bleeding. He received 2  units of packed cells while in [facility] hospital. His hemoglobin was 7.1 on admission and Eduardo following day was 6.7. When he went home it was 9.9 after transfusions.   Current medicines are reviewed at length with [name] today.  [name] does not have concerns regarding medicines.  Eduardo following changes have been made:  no change    Orders Placed Eduardo Encounter  Procedures  . CBC with Differential/Platelet  . Basic metabolic panel  . EKG 12-Lead     Disposition: Continue current medication.  Recheck in 4 months for office visit CBC and basal metabolic panel.  Berna Spare MD 11/29/2014 6:05 PM    [address] [place], [place], [place]  [address] Phone: [phone]; Fax: [phone]

## 2014-11-29 NOTE — Patient Instructions (Signed)
Medication Instructions:  Your physician recommends that you continue on your current medications as directed. Please refer to the Current Medication list given to you today.  Labwork: NONE  Testing/Procedures: NONE  Follow-Up: Your physician wants you to follow-up in: 4 MONTH OV/CBC/BMET You will receive a reminder letter in the mail two months in advance. If you don't receive a letter, please call our office to schedule the follow-up appointment.   

## 2014-12-02 ENCOUNTER — Other Ambulatory Visit: Payer: Self-pay | Admitting: Neurology

## 2014-12-02 DIAGNOSIS — H903 Sensorineural hearing loss, bilateral: Secondary | ICD-10-CM | POA: Diagnosis not present

## 2014-12-03 ENCOUNTER — Encounter: Payer: Self-pay | Admitting: Neurology

## 2014-12-03 ENCOUNTER — Ambulatory Visit: Payer: Medicare Other | Admitting: Neurology

## 2014-12-03 ENCOUNTER — Ambulatory Visit (INDEPENDENT_AMBULATORY_CARE_PROVIDER_SITE_OTHER): Payer: Medicare Other | Admitting: Neurology

## 2014-12-03 VITALS — BP 119/60 | HR 68 | Ht 75.0 in | Wt 224.0 lb

## 2014-12-03 DIAGNOSIS — F0390 Unspecified dementia without behavioral disturbance: Secondary | ICD-10-CM | POA: Diagnosis not present

## 2014-12-03 DIAGNOSIS — R269 Unspecified abnormalities of gait and mobility: Secondary | ICD-10-CM

## 2014-12-03 MED ORDER — DONEPEZIL HCL 10 MG PO TABS: 10.0000 mg | ORAL_TABLET | Freq: Every day | ORAL | Status: DC

## 2014-12-03 MED ORDER — MEMANTINE HCL ER 28 MG PO CP24: 28.0000 mg | ORAL_CAPSULE | Freq: Every day | ORAL | Status: DC

## 2014-12-03 MED ORDER — HYDROXYZINE HCL 10 MG PO TABS: 10.0000 mg | ORAL_TABLET | Freq: Two times a day (BID) | ORAL | Status: DC | PRN

## 2014-12-03 NOTE — Progress Notes (Signed)
Chief Complaint  Patient presents with  . Memory Loss    MMSE - animals.  He is here with his wife, [name] and his son, [name].  They feel his memory has continued to worsen.  They feel part of his problem is his difficulty with hearing and they are hopeful he will getting new hearing aids.  . Gait Problem    He ambulates slowly with the aid of his walker.      PATIENT: [name] DOB: 1927/12/04  HISTORICAL (inital March 9th 2015)  [name] is a 81 yo RH referred by cardiologist [name], Primary care is Dr. Timoteo Gaul, for evaluation of memory trouble, gait difficulty, he is accompanied by his wife, and daughter at today's clinical visit.  He had a past medical history of chronic atrial fibrillation, was on Coumadin treatment, but developed recurrent GI bleeding, from reviewing the chart, he is not on any anticoagulation treatment, he also had past medical history of obesity, sedentary lifestyle, diabetes,  Family noticed he had gradual onset memory trouble over the past few years, has been taking Aricept 10 mg every day, he also has gradual onset gait difficulty, began to use cane since 2012, he has no significant low back pain or neck pain, recent 12 months, since 2014, he has worsening gait difficulty, urinary incontinence, urgency, worsening memory trouble, he tends to repeat himself, forget familiar people's name, forgot to turn off stove, he has quit driving for a few months now,  He has bilateral lower extremity edema, recent echocardiogram showed ejection fraction 60-65%, moderate mitral regurgitation, mild aortic stenosis   He went to school for 10 years, was a Armed forces operational officer, was able to retired at age 35, traveled all over the world, but has become sedentary over the past 10 years, he still enjoys playing his computer games    UPDATE May 7th 2015:  I have reviewed MRI with patient and his family, MRI of brain showed moderate atrophy, mild to moderate periventricular small  vessel disease.   MRI of lumbar showed L4-5: pseudo-disc bulging with facet hypertrophy with moderate biforaminal foraminal stenosis, L5-S1: severe facet arthropathy with no spinal stenosis or foraminal narrowing.  At L1-2, L2-3, L3-4: disc bulging and facet hypertrophy with mild biforaminal foraminal stenosis   Laboratory showed a mild anemia, ferritin was 15, otherwise normal B12, CMP,  Family reported multiple episodes of anemia, in the past, no etiology was found, he had physical therapy for 3 weeks, no significant change, he denies low back pain, does has urinary urgency, occasionally incontinence episodes.  He has been taking gabapentin 300 mg every night which has helped his sleep,  UPDATE Dec 03 2014:  He is with his wife and son [name] at today's visit.  He has worsening memory trouble,   He does nto complainos f pain, good appated, sleep wells, urinry urgency, lasix,  Accidentally, bowel, miralax, conistiatipn,   His brother, father, grandfather    REVIEW OF SYSTEMS: Full 14 system review of systems performed and notable only for gait difficulty, bilateral lower extremity edema   ALLERGIES: Allergies  Allergen Reactions  . Cephalexin Other (See Comments)    Unknown reaction  . Furacin [Nitrofurazone] Other (See Comments)    Unknown reaction  . Macrodantin Other (See Comments)    Unknown reaction  . Zestril [Lisinopril] Other (See Comments)    Unknown reaction    HOME MEDICATIONS: Current Outpatient Prescriptions on File Prior to Visit  Medication Sig Dispense Refill  .  acetaminophen (TYLENOL) 325 MG tablet Take 650 mg by mouth every 6 (six) hours as needed for pain or fever.      . COD LIVER OIL PO Take 1 capsule by mouth daily.       [name] donepezil (ARICEPT) 10 MG tablet Take 10 mg by mouth at bedtime.      . ferrous sulfate 325 (65 FE) MG EC tablet Take 1 tablet (325 mg total) by mouth 2 (two) times daily.      . folic acid (FOLVITE) 1 MG tablet Take 1 mg by mouth 2  (two) times daily.       . furosemide (LASIX) 40 MG tablet Take 1 tablet (40 mg total) by mouth every morning.  90 tablet  3  . glipiZIDE (GLUCOTROL) 5 MG tablet Take 1 tablet (5 mg total) by mouth daily.  90 tablet  3  . memantine (NAMENDA) 10 MG tablet Take 1 tablet (10 mg total) by mouth 2 (two) times daily.  180 tablet  1  . metoprolol tartrate (LOPRESSOR) 25 MG tablet Take 0.5 tablets (12.5 mg total) by mouth every morning.  30 tablet  5  . Multiple Vitamin (MULTIVITAMIN) tablet Take 1 tablet by mouth every morning.       . Multiple Vitamins-Minerals (PRESERVISION AREDS 2) CAPS Take 1 capsule by mouth every morning.      . nitroGLYCERIN (NITROSTAT) 0.4 MG SL tablet Place 1 tablet (0.4 mg total) under the tongue every 5 (five) minutes as needed for chest pain.  100 tablet  0  . oxybutynin (DITROPAN XL) 15 MG 24 hr tablet Take 1 tablet (15 mg total) by mouth daily.  90 tablet  3  . pantoprazole (PROTONIX) 40 MG tablet Take 1 tablet (40 mg total) by mouth daily.  90 tablet  3  . Polyethylene Glycol 3350 (GLYCOLAX PO) Take 1 packet by mouth daily as needed. For constipation      . gabapentin (NEURONTIN) 300 MG capsule Take 1 capsule (300 mg total) by mouth at bedtime.  90 capsule  3  . [DISCONTINUED] oxybutynin (DITROPAN XL) 15 MG 24 hr tablet Take 1 tablet (15 mg total) by mouth daily.  90 tablet  3     PAST MEDICAL HISTORY: Past Medical History  Diagnosis Date  . Arrhythmia     a fib  . ED (erectile dysfunction)   . Memory disorder   . 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 Hematochezia     has intermittent problem  . Arthritis     DJD knee - left  . Restless leg     treats with heat  . Cancer     bladder cancer - BCG [name]  . Diabetes mellitus     NIDDM on oral medication  . Genital warts     recurrent. Not a problem  . GERD (gastroesophageal reflux disease)     treated with PPI  . Hypertension     mild  . Neuropathy     burning discomfort feet/legs    . Irritable bladder   . Hard of hearing     PAST SURGICAL HISTORY: Past Surgical History  Procedure Laterality Date  . Cardiovascular stress test  03/28/2003    EF 55%.  Normal cardiolite study  . Appendectomy  1946  . Tonsillectomy  1938  . Joint replacement  1990's    left TKR  . Knee arthroscopy  1980's    right knee  FAMILY HISTORY: Family History  Problem Relation Age of Onset  . Kidney disease Mother   . Heart disease Mother   . Stroke Mother   . Cancer Father     prostate  . Diabetes Neg Hx   . COPD Neg Hx   . Mental illness Brother     SOCIAL HISTORY:  Social History   Social History  . Marital Status: Married    Spouse Name: [name]  . Number of Children: 4  . Years of Education: 10th    Occupational History  . retired    Social History Main Topics  . Smoking status: Former Smoker    Quit date: 08/28/1975  . Smokeless tobacco: Former Systems developer  . Alcohol Use: 4.2 oz/week    7 Shots of liquor per week  . Drug Use: No  . Sexual Activity: Not Currently   Other Topics Concern  . Not on file   Social History Narrative   Patient lives at home with his wife [name]) Married 59 years.   Retired   Education 10 th grade   Right handed   Caffeine one or two daily    PHYSICAL EXAM   Filed Vitals:   12/03/14 1452  BP: 119/60  Pulse: 68  Height: 6\' 3"  (1.905 m)  Weight: 224 lb (101.606 kg)    Body mass index is 28 kg/(m^2).   PHYSICAL EXAMNIATION:  Gen: NAD, conversant, well nourised, obese, well groomed                     Cardiovascular: Regular rate rhythm, no peripheral edema, warm, nontender. Eyes: Conjunctivae clear without exudates or hemorrhage Neck: Supple, no carotid bruise. Pulmonary: Clear to auscultation bilaterally   NEUROLOGICAL EXAM:  MENTAL STATUS: Speech:    Speech is normal; fluent and spontaneous with normal comprehension.  Cognition: Mini-Mental Status Examination is 15 out of 30     He is not oriented to  time, place  and person     recent and remote memory: He missed 3 out of 3 recalls     Normal Attention span and concentration     Normal Language, naming,spontaneous speech, he has difficulty copy design, write a sentence     Fund of knowledgerepeating,    CRANIAL NERVES: CN II: Visual fields are full to confrontation.   Pupils are round equal and briskly reactive to light. CN III, IV, VI: extraocular movement are normal. No ptosis. CN V: Facial sensation is intact to pinprick in all 3 divisions bilaterally. Corneal responses are intact.  CN VII: Face is symmetric with normal eye closure and smile. CN VIII:  Hard of hearing bilaterally  CN IX, X: Palate elevates symmetrically. Phonation is normal. CN XI: Head turning and shoulder shrug are intact CN XII: Tongue is midline with normal movements and no atrophy.  MOTOR: There is no pronator drift of out-stretched arms. Muscle bulk and tone are normal. He has mild to moderate bilateral ankle dorsiflexion weakness  REFLEXES: Hyperreflexia of bilateral lower extremities. Plantar responses are flexor.  SENSORY: Length dependent sensory changes  COORDINATION: Rapid alternating movements and fine finger movements are intact. There is no dysmetria on finger-to-nose and heel-knee-shin.    GAIT/STANCE: He needs help to get up from seated position, neck flexion, cautious, bilateral foot drop, rely on his walker  DIAGNOSTIC DATA (LABS, IMAGING, TESTING) - I reviewed patient records, labs, notes, testing and imaging myself where available.  Lab Results  Component Value Date   WBC  4.1 08/02/2014   HGB 10.9* 08/02/2014   HCT 33.7* 08/02/2014   MCV 79.1 08/02/2014   PLT 155.0 08/02/2014      Component Value Date/Time   NA 133* 08/02/2014 1407   K 4.1 08/02/2014 1407   CL 96 08/02/2014 1407   CO2 35* 08/02/2014 1407   GLUCOSE 127* 08/02/2014 1407   BUN 11 08/02/2014 1407   CREATININE 0.85 08/02/2014 1407   CALCIUM 9.6 08/02/2014 1407   PROT 5.6*  07/11/2014 0615   ALBUMIN 3.3* 07/11/2014 0615   AST 16 07/11/2014 0615   ALT 13 07/11/2014 0615   ALKPHOS 74 07/11/2014 0615   BILITOT 1.1 07/11/2014 0615   GFRNONAA 66* 07/11/2014 0615   GFRAA 76* 07/11/2014 0615   Lab Results  Component Value Date   CHOL 101 06/04/2014   HDL 43.10 06/04/2014   LDLCALC 49 06/04/2014   TRIG 45.0 06/04/2014   CHOLHDL 2 06/04/2014   Lab Results  Component Value Date   HGBA1C 4.5* 07/16/2014   Lab Results  Component Value Date   VITAMINB12 836 07/10/2014    ASSESSMENT AND PLAN  KUE FOX is a 81 y.o. male    presenting with gradual onset memory trouble, gait difficulty, with distal leg weakness, hyporeflexia, Gait difficulty:  Multifactorial, due to n of lumbar stenosis, peripheral neuropathy , deconditioning Dementia:  Mini-Mental Status Examination is 15 out of 30 today,  Keep Namenda X R 28 mg daily, Aricept 10 mg daily  Encourage him moderate exercise Urinary urgency, nocturia,  Continue Vesicare 5 mg daily     Marcial Pacas, M.D. Ph.D.  [facility] [address] [place], [place] [address] [phone]

## 2014-12-16 DIAGNOSIS — I509 Heart failure, unspecified: Secondary | ICD-10-CM | POA: Diagnosis not present

## 2014-12-16 DIAGNOSIS — E669 Obesity, unspecified: Secondary | ICD-10-CM | POA: Diagnosis not present

## 2014-12-16 DIAGNOSIS — D649 Anemia, unspecified: Secondary | ICD-10-CM | POA: Diagnosis not present

## 2014-12-16 DIAGNOSIS — Z9181 History of falling: Secondary | ICD-10-CM | POA: Diagnosis not present

## 2014-12-16 DIAGNOSIS — R2689 Other abnormalities of gait and mobility: Secondary | ICD-10-CM | POA: Diagnosis not present

## 2014-12-16 DIAGNOSIS — I340 Nonrheumatic mitral (valve) insufficiency: Secondary | ICD-10-CM | POA: Diagnosis not present

## 2014-12-16 DIAGNOSIS — F0390 Unspecified dementia without behavioral disturbance: Secondary | ICD-10-CM | POA: Diagnosis not present

## 2014-12-16 DIAGNOSIS — Z8551 Personal history of malignant neoplasm of bladder: Secondary | ICD-10-CM | POA: Diagnosis not present

## 2014-12-16 DIAGNOSIS — I10 Essential (primary) hypertension: Secondary | ICD-10-CM | POA: Diagnosis not present

## 2014-12-16 DIAGNOSIS — Z7982 Long term (current) use of aspirin: Secondary | ICD-10-CM | POA: Diagnosis not present

## 2014-12-16 DIAGNOSIS — H9193 Unspecified hearing loss, bilateral: Secondary | ICD-10-CM | POA: Diagnosis not present

## 2014-12-16 DIAGNOSIS — E1140 Type 2 diabetes mellitus with diabetic neuropathy, unspecified: Secondary | ICD-10-CM | POA: Diagnosis not present

## 2014-12-16 DIAGNOSIS — I482 Chronic atrial fibrillation: Secondary | ICD-10-CM | POA: Diagnosis not present

## 2014-12-16 DIAGNOSIS — I350 Nonrheumatic aortic (valve) stenosis: Secondary | ICD-10-CM | POA: Diagnosis not present

## 2014-12-20 DIAGNOSIS — E1140 Type 2 diabetes mellitus with diabetic neuropathy, unspecified: Secondary | ICD-10-CM | POA: Diagnosis not present

## 2014-12-20 DIAGNOSIS — I509 Heart failure, unspecified: Secondary | ICD-10-CM | POA: Diagnosis not present

## 2014-12-20 DIAGNOSIS — R2689 Other abnormalities of gait and mobility: Secondary | ICD-10-CM | POA: Diagnosis not present

## 2014-12-20 DIAGNOSIS — F0390 Unspecified dementia without behavioral disturbance: Secondary | ICD-10-CM | POA: Diagnosis not present

## 2014-12-20 DIAGNOSIS — I10 Essential (primary) hypertension: Secondary | ICD-10-CM | POA: Diagnosis not present

## 2014-12-20 DIAGNOSIS — I482 Chronic atrial fibrillation: Secondary | ICD-10-CM | POA: Diagnosis not present

## 2014-12-23 ENCOUNTER — Telehealth: Payer: Self-pay | Admitting: Neurology

## 2014-12-23 DIAGNOSIS — I509 Heart failure, unspecified: Secondary | ICD-10-CM | POA: Diagnosis not present

## 2014-12-23 DIAGNOSIS — I482 Chronic atrial fibrillation: Secondary | ICD-10-CM | POA: Diagnosis not present

## 2014-12-23 DIAGNOSIS — I10 Essential (primary) hypertension: Secondary | ICD-10-CM | POA: Diagnosis not present

## 2014-12-23 DIAGNOSIS — E1140 Type 2 diabetes mellitus with diabetic neuropathy, unspecified: Secondary | ICD-10-CM | POA: Diagnosis not present

## 2014-12-23 DIAGNOSIS — R2689 Other abnormalities of gait and mobility: Secondary | ICD-10-CM | POA: Diagnosis not present

## 2014-12-23 DIAGNOSIS — F0390 Unspecified dementia without behavioral disturbance: Secondary | ICD-10-CM | POA: Diagnosis not present

## 2014-12-23 NOTE — Telephone Encounter (Signed)
Spoke to [place] - he received a referral for home health and after visit to the family, a need for OT was determined - he will have them come to the home for an evaluation.

## 2014-12-23 NOTE — Telephone Encounter (Signed)
[name] with [facility] is requesting verbal orders for OT for valuation for ADL.  Please call.

## 2014-12-26 ENCOUNTER — Telehealth: Payer: Self-pay | Admitting: Neurology

## 2014-12-26 DIAGNOSIS — I482 Chronic atrial fibrillation: Secondary | ICD-10-CM | POA: Diagnosis not present

## 2014-12-26 DIAGNOSIS — I509 Heart failure, unspecified: Secondary | ICD-10-CM | POA: Diagnosis not present

## 2014-12-26 DIAGNOSIS — R2689 Other abnormalities of gait and mobility: Secondary | ICD-10-CM | POA: Diagnosis not present

## 2014-12-26 DIAGNOSIS — E1140 Type 2 diabetes mellitus with diabetic neuropathy, unspecified: Secondary | ICD-10-CM | POA: Diagnosis not present

## 2014-12-26 DIAGNOSIS — F0390 Unspecified dementia without behavioral disturbance: Secondary | ICD-10-CM | POA: Diagnosis not present

## 2014-12-26 DIAGNOSIS — I10 Essential (primary) hypertension: Secondary | ICD-10-CM | POA: Diagnosis not present

## 2014-12-26 NOTE — Telephone Encounter (Signed)
[facility], Herbert Deaner , called pt had a fall 10-5, bruising is the only injury. Just wanted to f/u with the request for OT. Requesting SNF for medication management  [facility] management. [phone]

## 2014-12-26 NOTE — Telephone Encounter (Signed)
Dr. Krista Blue is aware of the information below - ok to provide verbal orders for OT and SNF.  Spoke to [place] and provided him with verbal orders for both.

## 2014-12-30 DIAGNOSIS — F0390 Unspecified dementia without behavioral disturbance: Secondary | ICD-10-CM | POA: Diagnosis not present

## 2014-12-30 DIAGNOSIS — R2689 Other abnormalities of gait and mobility: Secondary | ICD-10-CM | POA: Diagnosis not present

## 2014-12-30 DIAGNOSIS — I10 Essential (primary) hypertension: Secondary | ICD-10-CM | POA: Diagnosis not present

## 2014-12-30 DIAGNOSIS — I482 Chronic atrial fibrillation: Secondary | ICD-10-CM | POA: Diagnosis not present

## 2014-12-30 DIAGNOSIS — I509 Heart failure, unspecified: Secondary | ICD-10-CM | POA: Diagnosis not present

## 2014-12-30 DIAGNOSIS — E1140 Type 2 diabetes mellitus with diabetic neuropathy, unspecified: Secondary | ICD-10-CM | POA: Diagnosis not present

## 2014-12-31 DIAGNOSIS — I482 Chronic atrial fibrillation: Secondary | ICD-10-CM | POA: Diagnosis not present

## 2014-12-31 DIAGNOSIS — F0390 Unspecified dementia without behavioral disturbance: Secondary | ICD-10-CM | POA: Diagnosis not present

## 2014-12-31 DIAGNOSIS — R2689 Other abnormalities of gait and mobility: Secondary | ICD-10-CM | POA: Diagnosis not present

## 2014-12-31 DIAGNOSIS — E1140 Type 2 diabetes mellitus with diabetic neuropathy, unspecified: Secondary | ICD-10-CM | POA: Diagnosis not present

## 2014-12-31 DIAGNOSIS — I10 Essential (primary) hypertension: Secondary | ICD-10-CM | POA: Diagnosis not present

## 2014-12-31 DIAGNOSIS — I509 Heart failure, unspecified: Secondary | ICD-10-CM | POA: Diagnosis not present

## 2015-01-01 ENCOUNTER — Telehealth: Payer: Self-pay | Admitting: Cardiology

## 2015-01-01 DIAGNOSIS — Z79899 Other long term (current) drug therapy: Secondary | ICD-10-CM

## 2015-01-01 NOTE — Telephone Encounter (Signed)
[facility] phone wanting to discuss patients medications  Reviewed medications with her and there were several differences Advised nurse that her [name] usually knows the medications and to call him for review Once she has reviewed with [name] call back and will review medications and how patient is taking with  [name] for his recommendations

## 2015-01-01 NOTE — Telephone Encounter (Signed)
New Message: Chong Sicilian was calling to speak with you to review the pt's med profile.Please f/u with her when you can  Thanks

## 2015-01-02 ENCOUNTER — Other Ambulatory Visit: Payer: Self-pay | Admitting: Internal Medicine

## 2015-01-02 NOTE — Telephone Encounter (Signed)
Follow up     FYI Caregiver have been giving lasix 60mg  daily

## 2015-01-03 DIAGNOSIS — I10 Essential (primary) hypertension: Secondary | ICD-10-CM | POA: Diagnosis not present

## 2015-01-03 DIAGNOSIS — E1140 Type 2 diabetes mellitus with diabetic neuropathy, unspecified: Secondary | ICD-10-CM | POA: Diagnosis not present

## 2015-01-03 DIAGNOSIS — I482 Chronic atrial fibrillation: Secondary | ICD-10-CM | POA: Diagnosis not present

## 2015-01-03 DIAGNOSIS — I509 Heart failure, unspecified: Secondary | ICD-10-CM | POA: Diagnosis not present

## 2015-01-03 DIAGNOSIS — R2689 Other abnormalities of gait and mobility: Secondary | ICD-10-CM | POA: Diagnosis not present

## 2015-01-03 DIAGNOSIS — F0390 Unspecified dementia without behavioral disturbance: Secondary | ICD-10-CM | POA: Diagnosis not present

## 2015-01-03 NOTE — Telephone Encounter (Signed)
Spoke with son and advised to continue current Lasix dose per  Dr. Michiel Cowboy to phone [facility] and unable to leave message secondary to no space available on phone

## 2015-01-03 NOTE — Telephone Encounter (Signed)
Follow up     Calling to review lasix order.  Please call

## 2015-01-04 DIAGNOSIS — E1140 Type 2 diabetes mellitus with diabetic neuropathy, unspecified: Secondary | ICD-10-CM | POA: Diagnosis not present

## 2015-01-04 DIAGNOSIS — I10 Essential (primary) hypertension: Secondary | ICD-10-CM | POA: Diagnosis not present

## 2015-01-04 DIAGNOSIS — R2689 Other abnormalities of gait and mobility: Secondary | ICD-10-CM | POA: Diagnosis not present

## 2015-01-04 DIAGNOSIS — F0390 Unspecified dementia without behavioral disturbance: Secondary | ICD-10-CM | POA: Diagnosis not present

## 2015-01-04 DIAGNOSIS — I482 Chronic atrial fibrillation: Secondary | ICD-10-CM | POA: Diagnosis not present

## 2015-01-04 DIAGNOSIS — I509 Heart failure, unspecified: Secondary | ICD-10-CM | POA: Diagnosis not present

## 2015-01-06 DIAGNOSIS — I482 Chronic atrial fibrillation: Secondary | ICD-10-CM | POA: Diagnosis not present

## 2015-01-06 DIAGNOSIS — E1140 Type 2 diabetes mellitus with diabetic neuropathy, unspecified: Secondary | ICD-10-CM | POA: Diagnosis not present

## 2015-01-06 DIAGNOSIS — F0390 Unspecified dementia without behavioral disturbance: Secondary | ICD-10-CM | POA: Diagnosis not present

## 2015-01-06 DIAGNOSIS — R2689 Other abnormalities of gait and mobility: Secondary | ICD-10-CM | POA: Diagnosis not present

## 2015-01-06 DIAGNOSIS — I10 Essential (primary) hypertension: Secondary | ICD-10-CM | POA: Diagnosis not present

## 2015-01-06 DIAGNOSIS — I509 Heart failure, unspecified: Secondary | ICD-10-CM | POA: Diagnosis not present

## 2015-01-06 NOTE — Telephone Encounter (Signed)
Can this encounter be closed?

## 2015-01-07 DIAGNOSIS — E1140 Type 2 diabetes mellitus with diabetic neuropathy, unspecified: Secondary | ICD-10-CM | POA: Diagnosis not present

## 2015-01-07 DIAGNOSIS — F0390 Unspecified dementia without behavioral disturbance: Secondary | ICD-10-CM | POA: Diagnosis not present

## 2015-01-07 DIAGNOSIS — I509 Heart failure, unspecified: Secondary | ICD-10-CM | POA: Diagnosis not present

## 2015-01-07 DIAGNOSIS — I10 Essential (primary) hypertension: Secondary | ICD-10-CM | POA: Diagnosis not present

## 2015-01-07 DIAGNOSIS — I482 Chronic atrial fibrillation: Secondary | ICD-10-CM | POA: Diagnosis not present

## 2015-01-07 DIAGNOSIS — R2689 Other abnormalities of gait and mobility: Secondary | ICD-10-CM | POA: Diagnosis not present

## 2015-01-08 ENCOUNTER — Other Ambulatory Visit: Payer: Medicare Other

## 2015-01-08 DIAGNOSIS — R2689 Other abnormalities of gait and mobility: Secondary | ICD-10-CM | POA: Diagnosis not present

## 2015-01-08 DIAGNOSIS — F0390 Unspecified dementia without behavioral disturbance: Secondary | ICD-10-CM | POA: Diagnosis not present

## 2015-01-08 DIAGNOSIS — I482 Chronic atrial fibrillation: Secondary | ICD-10-CM | POA: Diagnosis not present

## 2015-01-08 DIAGNOSIS — E1140 Type 2 diabetes mellitus with diabetic neuropathy, unspecified: Secondary | ICD-10-CM | POA: Diagnosis not present

## 2015-01-08 DIAGNOSIS — I509 Heart failure, unspecified: Secondary | ICD-10-CM | POA: Diagnosis not present

## 2015-01-08 DIAGNOSIS — I10 Essential (primary) hypertension: Secondary | ICD-10-CM | POA: Diagnosis not present

## 2015-01-08 NOTE — Telephone Encounter (Signed)
Spoke with [facility] and patient no longer on nursing service  Did schedule follow up labs for patient today

## 2015-01-09 ENCOUNTER — Other Ambulatory Visit: Payer: Medicare Other | Admitting: *Deleted

## 2015-01-09 DIAGNOSIS — R2689 Other abnormalities of gait and mobility: Secondary | ICD-10-CM | POA: Diagnosis not present

## 2015-01-09 DIAGNOSIS — I482 Chronic atrial fibrillation: Secondary | ICD-10-CM | POA: Diagnosis not present

## 2015-01-09 DIAGNOSIS — Z79899 Other long term (current) drug therapy: Secondary | ICD-10-CM | POA: Diagnosis not present

## 2015-01-09 DIAGNOSIS — F0390 Unspecified dementia without behavioral disturbance: Secondary | ICD-10-CM | POA: Diagnosis not present

## 2015-01-09 DIAGNOSIS — E1140 Type 2 diabetes mellitus with diabetic neuropathy, unspecified: Secondary | ICD-10-CM | POA: Diagnosis not present

## 2015-01-09 DIAGNOSIS — I10 Essential (primary) hypertension: Secondary | ICD-10-CM | POA: Diagnosis not present

## 2015-01-09 DIAGNOSIS — I509 Heart failure, unspecified: Secondary | ICD-10-CM | POA: Diagnosis not present

## 2015-01-09 LAB — CBC WITH DIFFERENTIAL/PLATELET
BASOS ABS: 0 10*3/uL (ref 0.0–0.1)
Basophils Relative: 0 % (ref 0–1)
EOS ABS: 0.1 10*3/uL (ref 0.0–0.7)
Eosinophils Relative: 1 % (ref 0–5)
HCT: 33.7 % — ABNORMAL LOW (ref 39.0–52.0)
Hemoglobin: 10.8 g/dL — ABNORMAL LOW (ref 13.0–17.0)
Lymphocytes Relative: 6 % — ABNORMAL LOW (ref 12–46)
Lymphs Abs: 0.4 10*3/uL — ABNORMAL LOW (ref 0.7–4.0)
MCH: 26 pg (ref 26.0–34.0)
MCHC: 32 g/dL (ref 30.0–36.0)
MCV: 81.2 fL (ref 78.0–100.0)
MPV: 9.2 fL (ref 8.6–12.4)
Monocytes Absolute: 1.1 10*3/uL — ABNORMAL HIGH (ref 0.1–1.0)
Monocytes Relative: 16 % — ABNORMAL HIGH (ref 3–12)
NEUTROS PCT: 77 % (ref 43–77)
Neutro Abs: 5.4 10*3/uL (ref 1.7–7.7)
PLATELETS: 281 10*3/uL (ref 150–400)
RBC: 4.15 MIL/uL — AB (ref 4.22–5.81)
RDW: 15.8 % — AB (ref 11.5–15.5)
WBC: 7 10*3/uL (ref 4.0–10.5)

## 2015-01-09 LAB — BASIC METABOLIC PANEL
BUN: 12 mg/dL (ref 7–25)
CO2: 29 mmol/L (ref 20–31)
Calcium: 9 mg/dL (ref 8.6–10.3)
Chloride: 97 mmol/L — ABNORMAL LOW (ref 98–110)
Creat: 0.8 mg/dL (ref 0.70–1.11)
Glucose, Bld: 181 mg/dL — ABNORMAL HIGH (ref 65–99)
POTASSIUM: 4.4 mmol/L (ref 3.5–5.3)
Sodium: 136 mmol/L (ref 135–146)

## 2015-01-10 ENCOUNTER — Emergency Department (HOSPITAL_COMMUNITY): Payer: Medicare Other

## 2015-01-10 ENCOUNTER — Encounter (HOSPITAL_COMMUNITY): Payer: Self-pay | Admitting: Emergency Medicine

## 2015-01-10 ENCOUNTER — Inpatient Hospital Stay (HOSPITAL_COMMUNITY)
Admission: EM | Admit: 2015-01-10 | Discharge: 2015-01-20 | DRG: 871 | Disposition: A | Discharge status: Home Health | Payer: Medicare Other | Attending: Internal Medicine | Admitting: Internal Medicine

## 2015-01-10 ENCOUNTER — Telehealth: Payer: Self-pay | Admitting: Adult Health

## 2015-01-10 DIAGNOSIS — I5042 Chronic combined systolic (congestive) and diastolic (congestive) heart failure: Secondary | ICD-10-CM | POA: Diagnosis present

## 2015-01-10 DIAGNOSIS — I4891 Unspecified atrial fibrillation: Secondary | ICD-10-CM | POA: Diagnosis not present

## 2015-01-10 DIAGNOSIS — E119 Type 2 diabetes mellitus without complications: Secondary | ICD-10-CM | POA: Diagnosis present

## 2015-01-10 DIAGNOSIS — I314 Cardiac tamponade: Secondary | ICD-10-CM | POA: Diagnosis not present

## 2015-01-10 DIAGNOSIS — I313 Pericardial effusion (noninflammatory): Secondary | ICD-10-CM | POA: Diagnosis not present

## 2015-01-10 DIAGNOSIS — J9600 Acute respiratory failure, unspecified whether with hypoxia or hypercapnia: Secondary | ICD-10-CM | POA: Diagnosis not present

## 2015-01-10 DIAGNOSIS — R0602 Shortness of breath: Secondary | ICD-10-CM

## 2015-01-10 DIAGNOSIS — R1310 Dysphagia, unspecified: Secondary | ICD-10-CM | POA: Diagnosis present

## 2015-01-10 DIAGNOSIS — G9340 Encephalopathy, unspecified: Secondary | ICD-10-CM | POA: Diagnosis not present

## 2015-01-10 DIAGNOSIS — J189 Pneumonia, unspecified organism: Secondary | ICD-10-CM | POA: Diagnosis not present

## 2015-01-10 DIAGNOSIS — I482 Chronic atrial fibrillation: Secondary | ICD-10-CM | POA: Diagnosis not present

## 2015-01-10 DIAGNOSIS — F0390 Unspecified dementia without behavioral disturbance: Secondary | ICD-10-CM | POA: Diagnosis present

## 2015-01-10 DIAGNOSIS — Z87891 Personal history of nicotine dependence: Secondary | ICD-10-CM

## 2015-01-10 DIAGNOSIS — I3139 Other pericardial effusion (noninflammatory): Secondary | ICD-10-CM | POA: Insufficient documentation

## 2015-01-10 DIAGNOSIS — J9601 Acute respiratory failure with hypoxia: Secondary | ICD-10-CM | POA: Diagnosis not present

## 2015-01-10 DIAGNOSIS — D649 Anemia, unspecified: Secondary | ICD-10-CM | POA: Diagnosis present

## 2015-01-10 DIAGNOSIS — Z23 Encounter for immunization: Secondary | ICD-10-CM

## 2015-01-10 DIAGNOSIS — Z9889 Other specified postprocedural states: Secondary | ICD-10-CM | POA: Insufficient documentation

## 2015-01-10 DIAGNOSIS — R9431 Abnormal electrocardiogram [ECG] [EKG]: Secondary | ICD-10-CM | POA: Diagnosis present

## 2015-01-10 DIAGNOSIS — D72829 Elevated white blood cell count, unspecified: Secondary | ICD-10-CM | POA: Diagnosis not present

## 2015-01-10 DIAGNOSIS — I319 Disease of pericardium, unspecified: Secondary | ICD-10-CM | POA: Diagnosis not present

## 2015-01-10 DIAGNOSIS — A419 Sepsis, unspecified organism: Secondary | ICD-10-CM | POA: Diagnosis not present

## 2015-01-10 DIAGNOSIS — R091 Pleurisy: Secondary | ICD-10-CM | POA: Diagnosis not present

## 2015-01-10 DIAGNOSIS — R4182 Altered mental status, unspecified: Secondary | ICD-10-CM | POA: Diagnosis not present

## 2015-01-10 DIAGNOSIS — J984 Other disorders of lung: Secondary | ICD-10-CM | POA: Diagnosis not present

## 2015-01-10 DIAGNOSIS — I4581 Long QT syndrome: Secondary | ICD-10-CM

## 2015-01-10 DIAGNOSIS — R627 Adult failure to thrive: Secondary | ICD-10-CM | POA: Diagnosis present

## 2015-01-10 DIAGNOSIS — Z7982 Long term (current) use of aspirin: Secondary | ICD-10-CM | POA: Diagnosis not present

## 2015-01-10 DIAGNOSIS — J9811 Atelectasis: Secondary | ICD-10-CM | POA: Diagnosis not present

## 2015-01-10 DIAGNOSIS — Z883 Allergy status to other anti-infective agents status: Secondary | ICD-10-CM | POA: Diagnosis not present

## 2015-01-10 DIAGNOSIS — K219 Gastro-esophageal reflux disease without esophagitis: Secondary | ICD-10-CM | POA: Diagnosis present

## 2015-01-10 DIAGNOSIS — I5022 Chronic systolic (congestive) heart failure: Secondary | ICD-10-CM | POA: Diagnosis not present

## 2015-01-10 DIAGNOSIS — I309 Acute pericarditis, unspecified: Secondary | ICD-10-CM | POA: Diagnosis not present

## 2015-01-10 DIAGNOSIS — R0682 Tachypnea, not elsewhere classified: Secondary | ICD-10-CM | POA: Diagnosis not present

## 2015-01-10 DIAGNOSIS — J90 Pleural effusion, not elsewhere classified: Secondary | ICD-10-CM | POA: Diagnosis present

## 2015-01-10 HISTORY — DX: Malignant neoplasm of bladder, unspecified: C67.9

## 2015-01-10 LAB — CBC WITH DIFFERENTIAL/PLATELET
BASOS ABS: 0 10*3/uL (ref 0.0–0.1)
Basophils Relative: 0 %
EOS PCT: 0 %
Eosinophils Absolute: 0 10*3/uL (ref 0.0–0.7)
HCT: 36.8 % — ABNORMAL LOW (ref 39.0–52.0)
Hemoglobin: 11.5 g/dL — ABNORMAL LOW (ref 13.0–17.0)
LYMPHS PCT: 3 %
Lymphs Abs: 0.3 10*3/uL — ABNORMAL LOW (ref 0.7–4.0)
MCH: 26.1 pg (ref 26.0–34.0)
MCHC: 31.3 g/dL (ref 30.0–36.0)
MCV: 83.6 fL (ref 78.0–100.0)
MONO ABS: 0.8 10*3/uL (ref 0.1–1.0)
Monocytes Relative: 10 %
Neutro Abs: 6.8 10*3/uL (ref 1.7–7.7)
Neutrophils Relative %: 87 %
PLATELETS: 238 10*3/uL (ref 150–400)
RBC: 4.4 MIL/uL (ref 4.22–5.81)
RDW: 15.8 % — AB (ref 11.5–15.5)
WBC: 7.9 10*3/uL (ref 4.0–10.5)

## 2015-01-10 LAB — I-STAT CG4 LACTIC ACID, ED: LACTIC ACID, VENOUS: 1.22 mmol/L (ref 0.5–2.0)

## 2015-01-10 LAB — COMPREHENSIVE METABOLIC PANEL
ALT: 20 U/L (ref 17–63)
ANION GAP: 7 (ref 5–15)
AST: 25 U/L (ref 15–41)
Albumin: 3 g/dL — ABNORMAL LOW (ref 3.5–5.0)
Alkaline Phosphatase: 170 U/L — ABNORMAL HIGH (ref 38–126)
BUN: 11 mg/dL (ref 6–20)
CHLORIDE: 96 mmol/L — AB (ref 101–111)
CO2: 31 mmol/L (ref 22–32)
Calcium: 8.8 mg/dL — ABNORMAL LOW (ref 8.9–10.3)
Creatinine, Ser: 0.91 mg/dL (ref 0.61–1.24)
Glucose, Bld: 184 mg/dL — ABNORMAL HIGH (ref 65–99)
POTASSIUM: 4.5 mmol/L (ref 3.5–5.1)
Sodium: 134 mmol/L — ABNORMAL LOW (ref 135–145)
Total Bilirubin: 0.5 mg/dL (ref 0.3–1.2)
Total Protein: 6.9 g/dL (ref 6.5–8.1)

## 2015-01-10 LAB — BRAIN NATRIURETIC PEPTIDE: B NATRIURETIC PEPTIDE 5: 91.6 pg/mL (ref 0.0–100.0)

## 2015-01-10 LAB — URINALYSIS, ROUTINE W REFLEX MICROSCOPIC
BILIRUBIN URINE: NEGATIVE
GLUCOSE, UA: NEGATIVE mg/dL
HGB URINE DIPSTICK: NEGATIVE
KETONES UR: NEGATIVE mg/dL
Nitrite: NEGATIVE
PROTEIN: NEGATIVE mg/dL
Specific Gravity, Urine: 1.017 (ref 1.005–1.030)
Urobilinogen, UA: 0.2 mg/dL (ref 0.0–1.0)
pH: 6 (ref 5.0–8.0)

## 2015-01-10 LAB — URINE MICROSCOPIC-ADD ON

## 2015-01-10 LAB — MAGNESIUM: Magnesium: 2.1 mg/dL (ref 1.7–2.4)

## 2015-01-10 LAB — I-STAT TROPONIN, ED: TROPONIN I, POC: 0.02 ng/mL (ref 0.00–0.08)

## 2015-01-10 LAB — AMMONIA: AMMONIA: 10 umol/L (ref 9–35)

## 2015-01-10 MED ORDER — NITROGLYCERIN 0.4 MG SL SUBL: 0.4000 mg | SUBLINGUAL_TABLET | SUBLINGUAL | Status: DC | PRN

## 2015-01-10 MED ORDER — DOXYCYCLINE HYCLATE 100 MG PO TABS
100.0000 mg | ORAL_TABLET | Freq: Two times a day (BID) | ORAL | Status: AC
  Administered 2015-01-10 – 2015-01-17 (×13): 100 mg via ORAL
  Filled 2015-01-10 (×15): qty 1

## 2015-01-10 MED ORDER — GABAPENTIN 100 MG PO CAPS
100.0000 mg | ORAL_CAPSULE | Freq: Every day | ORAL | Status: DC
  Administered 2015-01-10 – 2015-01-19 (×9): 100 mg via ORAL
  Filled 2015-01-10 (×11): qty 1

## 2015-01-10 MED ORDER — HYDROXYZINE HCL 10 MG PO TABS
5.0000 mg | ORAL_TABLET | Freq: Two times a day (BID) | ORAL | Status: DC
  Administered 2015-01-10 – 2015-01-20 (×19): 5 mg via ORAL
  Filled 2015-01-10 (×23): qty 1

## 2015-01-10 MED ORDER — FUROSEMIDE 40 MG PO TABS
60.0000 mg | ORAL_TABLET | Freq: Every day | ORAL | Status: DC
  Administered 2015-01-11: 60 mg via ORAL
  Filled 2015-01-10 (×2): qty 2

## 2015-01-10 MED ORDER — MEMANTINE HCL ER 28 MG PO CP24
28.0000 mg | ORAL_CAPSULE | Freq: Every day | ORAL | Status: DC
  Administered 2015-01-11 – 2015-01-20 (×10): 28 mg via ORAL
  Filled 2015-01-10 (×10): qty 1

## 2015-01-10 MED ORDER — DARIFENACIN HYDROBROMIDE ER 7.5 MG PO TB24
7.5000 mg | ORAL_TABLET | Freq: Every day | ORAL | Status: DC
  Administered 2015-01-11 – 2015-01-20 (×10): 7.5 mg via ORAL
  Filled 2015-01-10 (×10): qty 1

## 2015-01-10 MED ORDER — ASPIRIN 81 MG PO CHEW
81.0000 mg | CHEWABLE_TABLET | Freq: Every day | ORAL | Status: DC
  Administered 2015-01-11 – 2015-01-20 (×10): 81 mg via ORAL
  Filled 2015-01-10 (×10): qty 1

## 2015-01-10 MED ORDER — SODIUM CHLORIDE 0.9 % IJ SOLN
3.0000 mL | Freq: Two times a day (BID) | INTRAMUSCULAR | Status: DC
  Administered 2015-01-10 – 2015-01-16 (×11): 3 mL via INTRAVENOUS

## 2015-01-10 MED ORDER — DEXTROSE 5 % IV SOLN
1.0000 g | INTRAVENOUS | Status: AC
  Administered 2015-01-10 – 2015-01-16 (×7): 1 g via INTRAVENOUS
  Filled 2015-01-10 (×8): qty 10

## 2015-01-10 MED ORDER — SODIUM CHLORIDE 0.9 % IJ SOLN: 3.0000 mL | INTRAMUSCULAR | Status: DC | PRN

## 2015-01-10 MED ORDER — DONEPEZIL HCL 10 MG PO TABS
10.0000 mg | ORAL_TABLET | Freq: Every day | ORAL | Status: DC
  Administered 2015-01-10 – 2015-01-19 (×9): 10 mg via ORAL
  Filled 2015-01-10 (×14): qty 1

## 2015-01-10 MED ORDER — LEVOFLOXACIN IN D5W 750 MG/150ML IV SOLN
750.0000 mg | Freq: Once | INTRAVENOUS | Status: AC
  Administered 2015-01-10: 750 mg via INTRAVENOUS
  Filled 2015-01-10: qty 150

## 2015-01-10 MED ORDER — METOPROLOL TARTRATE 12.5 MG HALF TABLET
12.5000 mg | ORAL_TABLET | Freq: Every morning | ORAL | Status: DC
  Administered 2015-01-11 – 2015-01-13 (×3): 12.5 mg via ORAL
  Filled 2015-01-10 (×3): qty 1

## 2015-01-10 MED ORDER — SODIUM CHLORIDE 0.9 % IV SOLN: 250.0000 mL | INTRAVENOUS | Status: DC | PRN

## 2015-01-10 NOTE — ED Notes (Signed)
Son states that the patient has been more lethargic and forgetful in the last month, and especially so in the last week.

## 2015-01-10 NOTE — Care Management Note (Signed)
Case Management Note  Patient Details  Name: ABDULMALIK DARCO MRN: [id number] Date of Birth: [birth year]  Subjective/Objective:                  CHIEF COMPLAINT: Shortness of breath Chronic Afib  Action/Plan: Cm spoke to patient at the bedside. Per the MD note, the pt has some periodic confusion. Pt said that he was living at home with spouse and 24 hour caregiver prior to admission. He said that his wife was also recently in the hospital but is home now.  Pt said that he has a walk-in shower and a rolling walker at home. Pt said that he thinks they have had HH recently with [facility].   CM will continue to follow for discharge planning needs and speak with family further to better assess home situation for discharge needs and disposition.  Expected Discharge Date:                  Expected Discharge Plan:  [place]  In-House Referral:     Discharge planning Services  CM Consult  Post Acute Care Choice:    Choice offered to:     DME Arranged:    DME Agency:     HH Arranged:    [place] Agency:     Status of Service:  In process, will continue to follow  Medicare Important Message Given:    Date Medicare IM Given:    Medicare IM give by:    Date Additional Medicare IM Given:    Additional Medicare Important Message give by:     If discussed at [place] of Stay Meetings, dates discussed:    Additional Comments:  Guido Sander, RN 01/10/2015, 9:34 PM

## 2015-01-10 NOTE — ED Notes (Addendum)
Per EMS the patient comes from home where his caregiver noticed today he was breathing rapidly (30 breaths per minute) and was confused.  Patient is disoriented to time.  En route ems infused 400 mL of NS.   Slight tremor noted in the patient's right forearm.

## 2015-01-10 NOTE — ED Notes (Signed)
Report attempted 

## 2015-01-10 NOTE — ED Provider Notes (Signed)
CSN: [id number]     Arrival date & time 01/10/15  1159 History   First MD Initiated Contact with Patient 01/10/15 1204     Chief Complaint  Patient presents with  . Altered Mental Status     (Consider location/radiation/quality/duration/timing/severity/associated sxs/prior Treatment) HPI Comments: Patient with history of dementia, congestive heart failure, chronic A. fib, GI bleeding requiring transfusion, diabetes, pneumonia -- found by caregiver today to be breathing rapidly and shallowly. EMS was called and patient was hypoxic on room air to 80%. This improved on nasal cannula. Patient reports feeling short of breath and that this is unusual for him. He denies chest or abdominal pain. Level V caveat due to dementia.  Patient is a 81 y.o. male presenting with altered mental status. The history is provided by the patient and medical records.  Altered Mental Status   Past Medical History  Diagnosis Date  . Arrhythmia     a fib  . ED (erectile dysfunction)   . Memory disorder   . 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 Hematochezia     has intermittent problem  . Arthritis     DJD knee - left  . Restless leg     treats with heat  . Cancer     bladder cancer - BCG [name]  . Diabetes mellitus     NIDDM on oral medication  . Genital warts     recurrent. Not a problem  . GERD (gastroesophageal reflux disease)     treated with PPI  . Hypertension     mild  . Neuropathy     burning discomfort feet/legs  . Irritable bladder   . Hard of hearing    Past Surgical History  Procedure Laterality Date  . Cardiovascular stress test  03/28/2003    EF 55%.  Normal cardiolite study  . Appendectomy  1946  . Tonsillectomy  1938  . Joint replacement  1990's    left TKR  . Knee arthroscopy  1980's    right knee   Family History  Problem Relation Age of Onset  . Kidney disease Mother   . Heart disease Mother   . Stroke Mother   . Cancer Father     prostate  .  Diabetes Neg Hx   . COPD Neg Hx   . Mental illness Brother    Social History  Substance Use Topics  . Smoking status: Former Smoker    Quit date: 08/28/1975  . Smokeless tobacco: Former Systems developer  . Alcohol Use: 4.2 oz/week    7 Shots of liquor per week    Review of Systems  Unable to perform ROS: Dementia  Respiratory: Positive for shortness of breath.       Allergies  Cephalexin; Furacin; Macrodantin; and Zestril  Home Medications   Prior to Admission medications   Medication Sig Start Date End Date Taking? Authorizing Provider  aspirin 81 MG tablet Take 81 mg by mouth daily.    Historical Provider, MD  donepezil (ARICEPT) 10 MG tablet Take 1 tablet (10 mg total) by mouth at bedtime. 12/03/14   Marcial Pacas, MD  ferrous sulfate 325 (65 FE) MG tablet Take 325 mg by mouth 2 (two) times daily.     Historical Provider, MD  folic acid (FOLVITE) 1 MG tablet Take 1 mg by mouth daily.    Historical Provider, MD  furosemide (LASIX) 40 MG tablet Take 40 mg by mouth as directed. 1 and 1/2  tablets by mouth daily    Historical Provider, MD  gabapentin (NEURONTIN) 100 MG capsule Take 100 mg by mouth at bedtime.    Historical Provider, MD  hydrOXYzine (ATARAX/VISTARIL) 10 MG tablet TAKE (1) TABLET TWICE A DAY AS NEEDED. 01/02/15   Hoyt Koch, MD  memantine (NAMENDA XR) 28 MG CP24 24 hr capsule Take 1 capsule (28 mg total) by mouth daily. 12/03/14   Marcial Pacas, MD  metoprolol tartrate (LOPRESSOR) 25 MG tablet TAKE 1/2 BY MOUTH EVERY MORNING (GENERIC FOR LOPRESSOR) 10/31/14   Darlin Coco, MD  Multiple Vitamins-Minerals (PRESERVISION AREDS 2 PO) Take by mouth 2 (two) times daily.    Historical Provider, MD  nitroGLYCERIN (NITROSTAT) 0.4 MG SL tablet Place 1 tablet (0.4 mg total) under the tongue every 5 (five) minutes as needed for chest pain. 02/11/14   Darlin Coco, MD  solifenacin (VESICARE) 5 MG tablet Take 1 tablet (5 mg total) by mouth daily. 08/12/14   Hoyt Koch, MD   triamcinolone cream (KENALOG) 0.1 % Apply 1 application topically 2 (two) times daily. 08/16/14   Lucretia Kern, DO   BP 93/63 mmHg  Pulse 89  Temp(Src) 100.4 F (38 C) (Rectal)  Resp 21  SpO2 98%   Physical Exam  Constitutional: He appears well-developed and well-nourished.  HENT:  Head: Normocephalic and atraumatic.  Mouth/Throat: Oropharynx is clear and moist.  Eyes: Conjunctivae are normal. Right eye exhibits no discharge. Left eye exhibits no discharge.  Neck: Normal range of motion. Neck supple.  Cardiovascular: Normal rate and normal heart sounds.  An irregularly irregular rhythm present.  No murmur heard. Pulmonary/Chest: Effort normal and breath sounds normal. No respiratory distress. He has no wheezes. He has no rales.  Abdominal: Soft. There is no tenderness. There is no rebound and no guarding.  Reducible nontender, ventral hernia  Musculoskeletal: He exhibits no edema or tenderness.  No lower extremity edema.  Neurological: He is alert. He is disoriented (Not oriented to time.).  Normal wrist movement of extremities. No facial droop.  Skin: Skin is warm and dry.  Psychiatric: He has a normal mood and affect.  Nursing note and vitals reviewed.   ED Course  Procedures (including critical care time) Labs Review Labs Reviewed  CBC WITH DIFFERENTIAL/PLATELET - Abnormal; Notable for the following:    Hemoglobin 11.5 (*)    HCT 36.8 (*)    RDW 15.8 (*)    Lymphs Abs 0.3 (*)    All other components within normal limits  COMPREHENSIVE METABOLIC PANEL - Abnormal; Notable for the following:    Sodium 134 (*)    Chloride 96 (*)    Glucose, Bld 184 (*)    Calcium 8.8 (*)    Albumin 3.0 (*)    Alkaline Phosphatase 170 (*)    All other components within normal limits  BRAIN NATRIURETIC PEPTIDE  URINALYSIS, ROUTINE W REFLEX MICROSCOPIC (NOT AT [facility])  I-STAT TROPOININ, ED  I-STAT CG4 LACTIC ACID, ED  POC OCCULT BLOOD, ED    Imaging Review Dg Chest Portable 1  View  01/10/2015  CLINICAL DATA:  Altered mental status and shortness of breath. EXAM: PORTABLE CHEST 1 VIEW COMPARISON:  07/16/2014 and 02/28/2014 FINDINGS: Lungs are adequately inflated with opacification over the left base/ retrocardiac region which may be due to effusion with atelectasis versus infection. Right lung is clear. There is marked cardiomegaly slightly worse as cannot exclude a component of pericardial fluid. Calcified plaque present over the aortic arch. Degenerative changes of  the spine. IMPRESSION: Opacification over the left base/ retrocardiac region which may be due to effusion/atelectasis although cannot exclude infection. PA and lateral chest x-ray would be better for evaluation of the left base as recommend follow-up to resolution. Marked cardiomegaly slightly worse as cannot exclude a component of pericardial fluid. Electronically Signed   By: Marin Olp M.D.   On: 01/10/2015 12:48   I have personally reviewed and evaluated these images and lab results as part of my medical decision-making.   EKG Interpretation None      12:08 PM Patient seen and examined. Work-up initiated. Medications ordered.   Vital signs reviewed and are as follows: BP 93/63 mmHg  Pulse 89  Temp(Src) 100.4 F (38 C) (Rectal)  Resp 21  SpO2 98%  Patient was discussed with and seen by Orpah Greek, MD.   2:47 PM Will admit for CAP, hypoxia, low-grade temp. Levoquin ordered 2/2 unknown cephalosporin allergy. Hemoccult is negative, result has not yet crossed into system.    MDM   Final diagnoses:  CAP (community acquired pneumonia)  Shortness of breath   Admit.    Carlisle Cater, PA-C 01/12/15 5885  Orpah Greek, MD 01/14/15 [phone]

## 2015-01-10 NOTE — Telephone Encounter (Signed)
[facility] Patient Name: [name] DOB: 08-May-1927 Initial Comment Caller states father woke up wet with sweat, vomiting, very congested, coughing, trouble breathing Nurse Assessment Nurse: Mechele Dawley, RN, Amy Date/Time (Eastern Time): 01/10/2015 10:55:21 AM Confirm and document reason for call. If symptomatic, describe symptoms. ---SON STATES THAT HE WOKE UP AND HE WAS COMPLETELY WET WITH SWEAT. HE STATES THAT THE CARE GIVER CALLED AND GAVE HIM THIS REPORT ON HIS DAD. HE IS VOMITING, SEVERAL TIMES SINCE AWAKENED. COUGHING WITH A LOT OF PHLEGM. HE IS HAVING SOME BREATHING ISSUES. HE WENT TO 2 APPTS YESTERDAY. HE HAS BEEN GOING VERY SLOW WALKING. OVER THE LAST WEEK OR SO HE HAS BEEN MORE UNAWARE OF WHAT IS GOING ON AND A LITTLE TROUBLE WITH THE BALANCE. SOMETHING HAS BEEN GOING ON THE PAST 2-3 WEEKS HIS SON STATES. HE STATES THAT HIS WIFE IS IN [facility]. SO HOPEFULLY HE WILL BE ABLE TO GET HIM IN THE CAR AND HE WILL TAKE HIM THERE TO THE ED. Has the patient traveled out of the country within the last 30 days? ---Not Applicable Does the patient have any new or worsening symptoms? ---Yes Will a triage be completed? ---Yes Related visit to physician within the last 2 weeks? ---Yes Does the PT have any chronic conditions? (i.e. diabetes, asthma, etc.) ---Unknown Guidelines Guideline Title Affirmed Question Affirmed Notes Cough - Acute Productive Difficulty breathing Final Disposition User Go to ED Now [place], Therapist, sports, [facility] - ED Disagree/Comply: Comply

## 2015-01-10 NOTE — ED Notes (Signed)
Patient's tremor is more coarse.  Monitor is unable to read blood pressure, manual blood pressure obtained.

## 2015-01-10 NOTE — Telephone Encounter (Signed)
Patient seen in ED. 

## 2015-01-10 NOTE — H&P (Addendum)
[facility] History and Physical  BAZIL DHANANI QXI:[id number] DOB: Jun 02, 1927 DOA: 01/10/2015  Referring physician: Emergency Department PCP: [name], NP   CHIEF COMPLAINT:  Shortness of breath  HPI: [name] is a 81 y.o. male with a past medical history not limited to chronic atrial fibrillation (not anticoagulation candidate secondary to history of GI bleeding), diabetes, lumbar disc disease, and dementia.   Patient brought to the emergency department with increased complaints of shortness of breath and increased respiratory rate. He iwas found by EMS to be hypoxic with 02 sats in the eighties . He has a low-grade temp of 100.4 . Initial heart rate of 115.   Patient confused, history comes from son. Patient's wife is currently hospitalized here at home. Patient has had 24-hour care at home since wife has been in the hospital. Caregiver found patient with labored breathing this a.m.. While trying to get patient up in about, patient vomited. After taking morning medications he vomited a second time. Son recalls that patient has been coughing up phlegm over the last several days. He's had increasing fatigue and more confused than normal. .      ED COURSE:   Vitals: Temp 100.4 Normotensive. O2 saturation now 94% on 2 L. Heart rate improved to the 80s  Labs:   WBC  7.9,   hgb  11.5,   BUN 11,   Creatinine 0.9,  TROP 0.02,   BNP 91,   Lactic acid 1.22,   Anion Gap 7  Urinalysis:   Hyaline cast, pH 6, 11-20 WBCs    CXR:   Opacification over the left base/ retrocardiac region which may be due to effusion/atelectasis although cannot exclude infection. Marked cardiolmegaly , slightly worse  EKG:    Atrial fibrillation Low voltage, extremity and precordial leads Prolonged QT interval QT/QTc 488/580 ms P-R-T axes -1 77 39  Medications  levofloxacin (LEVAQUIN) IVPB 750 mg (750 mg Intravenous New Bag/Given 01/10/15 1412)     Review of Systems  Unable to perform  ROS  Past Medical History  Diagnosis Date  . Arrhythmia     a fib  . ED (erectile dysfunction)   . Memory disorder   . 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 Hematochezia     has intermittent problem  . Arthritis     DJD knee - left  . Restless leg     treats with heat  . Cancer [facility])     bladder cancer - [facility] [name]  . Diabetes mellitus     NIDDM on oral medication  . Genital warts     recurrent. Not a problem  . GERD (gastroesophageal reflux disease)     treated with PPI  . Hypertension     mild  . Neuropathy (HCC)     burning discomfort feet/legs  . Irritable bladder   . Hard of hearing    Past Surgical History  Procedure Laterality Date  . Cardiovascular stress test  03/28/2003    EF 55%.  Normal cardiolite study  . Appendectomy  1946  . Tonsillectomy  1938  . Joint replacement  1990's    left TKR  . Knee arthroscopy  1980's    right knee    SOCIAL HISTORY:  reports that he quit smoking about 39 years ago. He has quit using smokeless tobacco. He reports that he drinks about 4.2 oz of alcohol per week. He reports that he does not use illicit drugs.  Lives:  at home With:   wife Assistive devices:   Uses a Emergency planning/management officer for ambulation.   Allergies  Allergen Reactions  . Cephalexin Other (See Comments)    Unknown reaction  . Furacin [Nitrofurazone] Other (See Comments)    Unknown reaction  . Macrodantin Other (See Comments)    Unknown reaction  . Zestril [Lisinopril] Other (See Comments)    Unknown reaction    Family History  Problem Relation Age of Onset  . Kidney disease Mother   . Heart disease Mother   . Stroke Mother   . Cancer Father     prostate  . Diabetes Neg Hx   . COPD Neg Hx   . Mental illness Brother      Prior to Admission medications   Medication Sig Start Date End Date Taking? Authorizing Provider  aspirin 81 MG tablet Take 81 mg by mouth daily.    Historical Provider, MD  donepezil (ARICEPT) 10 MG tablet Take 1  tablet (10 mg total) by mouth at bedtime. 12/03/14   Marcial Pacas, MD  ferrous sulfate 325 (65 FE) MG tablet Take 325 mg by mouth 2 (two) times daily.     Historical Provider, MD  folic acid (FOLVITE) 1 MG tablet Take 1 mg by mouth daily.    Historical Provider, MD  furosemide (LASIX) 40 MG tablet Take 40 mg by mouth as directed. 1 and 1/2 tablets by mouth daily    Historical Provider, MD  gabapentin (NEURONTIN) 100 MG capsule Take 100 mg by mouth at bedtime.    Historical Provider, MD  hydrOXYzine (ATARAX/VISTARIL) 10 MG tablet TAKE (1) TABLET TWICE A DAY AS NEEDED. 01/02/15   Hoyt Koch, MD  memantine (NAMENDA XR) 28 MG CP24 24 hr capsule Take 1 capsule (28 mg total) by mouth daily. 12/03/14   Marcial Pacas, MD  metoprolol tartrate (LOPRESSOR) 25 MG tablet TAKE 1/2 BY MOUTH EVERY MORNING (GENERIC FOR LOPRESSOR) 10/31/14   Darlin Coco, MD  Multiple Vitamins-Minerals (PRESERVISION AREDS 2 PO) Take by mouth 2 (two) times daily.    Historical Provider, MD  nitroGLYCERIN (NITROSTAT) 0.4 MG SL tablet Place 1 tablet (0.4 mg total) under the tongue every 5 (five) minutes as needed for chest pain. 02/11/14   Darlin Coco, MD  solifenacin (VESICARE) 5 MG tablet Take 1 tablet (5 mg total) by mouth daily. 08/12/14   Hoyt Koch, MD  triamcinolone cream (KENALOG) 0.1 % Apply 1 application topically 2 (two) times daily. 08/16/14   Lucretia Kern, DO   PHYSICAL EXAM: Filed Vitals:   01/10/15 1300 01/10/15 1345 01/10/15 1400 01/10/15 1500  BP: 93/63 100/55 117/63 93/61  Pulse: 89 82 50 85  Temp:      TempSrc:      Resp: 21 15 23 21   SpO2: 98% 99% 96% 94%    Wt Readings from Last 3 Encounters:  12/03/14 101.606 kg (224 lb)  11/29/14 101.515 kg (223 lb 12.8 oz)  10/18/14 103.329 kg (227 lb 12.8 oz)    General:  Pleasant white male. Appears calm and comfortable Eyes: PER, normal lids, irises & conjunctiva ENT: grossly normal hearing, lips & tongue. Tongue with black coating Neck: no  LAD, no masses Cardiovascular: afib, normal rate. 1+ BLE edema.  Respiratory: Respirations even and unlabored. Normal respiratory effort. Significantly decreased breath sounds at bilateral bases. Crackles in LLL    Abdomen: soft, non-distended, non-tender, active bowel sounds. No obvious masses.  Skin: no rash seen on limited exam Musculoskeletal:  grossly normal tone BUE/BLE. Upper extremities shake when patient lifts arms. Also, + asterixis Psychiatric: grossly normal mood and affect, speech fluent and appropriate Neurologic: oriented to person and place but not time.         LABS ON ADMISSION:    Basic Metabolic Panel:  Recent Labs Lab 01/09/15 1422 01/10/15 1235  NA 136 134*  K 4.4 4.5  CL 97* 96*  CO2 29 31  GLUCOSE 181* 184*  BUN 12 11  CREATININE 0.80 0.91  CALCIUM 9.0 8.8*   Liver Function Tests:  Recent Labs Lab 01/10/15 1235  AST 25  ALT 20  ALKPHOS 170*  BILITOT 0.5  PROT 6.9  ALBUMIN 3.0*   CBC:  Recent Labs Lab 01/09/15 1422 01/10/15 1235  WBC 7.0 7.9  NEUTROABS 5.4 6.8  HGB 10.8* 11.5*  HCT 33.7* 36.8*  MCV 81.2 83.6  PLT 281 238    Recent Labs  07/10/14 1318 01/10/15 1236  BNP 149.3* 91.6    Radiological Exams on Admission: Dg Chest Portable 1 View  01/10/2015  CLINICAL DATA:  Altered mental status and shortness of breath. EXAM: PORTABLE CHEST 1 VIEW COMPARISON:  07/16/2014 and 02/28/2014 FINDINGS: Lungs are adequately inflated with opacification over the left base/ retrocardiac region which may be due to effusion with atelectasis versus infection. Right lung is clear. There is marked cardiomegaly slightly worse as cannot exclude a component of pericardial fluid. Calcified plaque present over the aortic arch. Degenerative changes of the spine. IMPRESSION: Opacification over the left base/ retrocardiac region which may be due to effusion/atelectasis although cannot exclude infection. PA and lateral chest x-ray would be better for evaluation  of the left base as recommend follow-up to resolution. Marked cardiomegaly slightly worse as cannot exclude a component of pericardial fluid. Electronically Signed   By: Marin Olp M.D.   On: 01/10/2015 12:48    Echo March 2015  Study Conclusions  - Left ventricle: The cavity size was normal. Wall thickness was normal. Systolic function was normal. The estimated ejection fraction was in the range of 55% to 60%. - Aortic valve: Trivial regurgitation. - Mitral valve: Calcified annulus. Mildly thickened leaflets . - Left atrium: The atrium was moderately dilated. - Right atrium: The atrium was moderately dilated. - Atrial septum: No defect or patent foramen ovale was identified. - Pulmonary arteries: PA peak pressure: 20mm Hg (S). Transthoracic echocardiography. M-mode, complete 2D, spectral Doppler, and color Doppler. Height: Height: 182.9cm. Height: 72in. Weight: Weight: 117kg. Weight: 257.5lb. Body mass index: BMI: 35kg/m^2. Body surface area:  BSA: 2.56m^2. Blood pressure:   149/67. Patient status: Outpatient. Location: [facility]   ASSESSMENT / PLAN    Acute respiratory failure.  Recent productive cough, now with SOB, low grade fever hypoxia.  This could be bronchitis or PNA, unable to tell by CXR. There is a  chronic left pleural effusion seen but there are also LLL crackles  --admit to tele bed - Continue O2 per [place] - Start on Rocephin and Doxycycline  for possible CAP - avoid Levaquin and Zithromax due to prolonged QTc - PA/LAT CXR tomorrow  Sepsis (fever, tachycardia, tachypnea). May be pulmonary in origin. Serum WBCs normal. WBCs in urine. Need to rule out UTI. -Send urine for culture -follow up CXR -Continue Levaquin   Chronic atrial fibrillation ([place]). Maintained on beta blocker. Not candidate for anticoagulation due to history of GI bleeding. Currently in afib, rate controlled. Will continue home beta blocker. Monitor on telemetry.  Chronic systolic heart failure. Followed by Dr, Mare Ferrari.  Echo Jan 2013 showed an ejection fraction of 60-65%, moderate mitral regurgitation, mild aortic insufficiency, moderate tricuspid regurgitation, and pulmonary artery pressure was 46 mmHg. No ischemia on Myoview November 2014.  BNP today is normal. No significant edema. Currently compensated -continue home dose of lasix and beta blocker.   Marked cardiomegaly , worse on today's CXR than in the past. Pericardial effusion not excluded.  -Obtain echocardiogram  Acute encephalopathy with underlying diagnosis of dementia, likely secondary to sepsis.  On Aricept and Namenda.  -Will continue both home medications and treat sepsis.   Prolonged Q-T interval on ECG.  -Repeat EKG in ED showed QTc of 565, otherwise stable -admit to telemetry -check Mg+ level  Chronic anemia. Stable. Per son patient worked up extensively through the years at [place] he had a "slow leak" from GI tract. Patient takes BID iron and gets blood transfusions about every two years. Hgb stable in 11 range now.    CONSULTANTS:   None  Code Status: Full code. Son to speak with family and let us know if code status needs to be changed DVT Prophylaxis: SCDs (history of GI bleeding) Family Communication: Spoke with Lestine Mount who is at bedside. He understands plan of care.  Disposition Plan: Discharge to home in 24-48 hours.    Time spent: 60 minutes Tye Savoy  NP [facility] Pager [phone]    I have examined the patient, reviewed the chart and agree with above assessment and plan as outlined by Tye Savoy, NP.  81 y/o with acute resp failure and sepsis likely from pneumonia. Admit for management with IV antibiotics.  F/u on 2 D ECHO as well.   Debbe Odea, MD

## 2015-01-11 ENCOUNTER — Inpatient Hospital Stay (HOSPITAL_COMMUNITY): Payer: Medicare Other

## 2015-01-11 ENCOUNTER — Encounter (HOSPITAL_COMMUNITY): Payer: Self-pay | Admitting: *Deleted

## 2015-01-11 DIAGNOSIS — I313 Pericardial effusion (noninflammatory): Secondary | ICD-10-CM

## 2015-01-11 LAB — HIV ANTIBODY (ROUTINE TESTING W REFLEX): HIV Screen 4th Generation wRfx: NONREACTIVE

## 2015-01-11 MED ORDER — INFLUENZA VAC SPLIT QUAD 0.5 ML IM SUSY
0.5000 mL | PREFILLED_SYRINGE | INTRAMUSCULAR | Status: AC
  Administered 2015-01-12: 0.5 mL via INTRAMUSCULAR
  Filled 2015-01-11: qty 0.5

## 2015-01-11 NOTE — Progress Notes (Signed)
  Echocardiogram 2D Echocardiogram has been performed.  Donata Clay 01/11/2015, 5:44 PM

## 2015-01-11 NOTE — Consult Note (Signed)
Cardiology Consult Note Eduardo Peng, NP Referring provider: [facility].  Reason for consult: Pericardial effusion on TTE  History of Present Illness (and review of medical records): [name] is a 81 y.o. male who is followed in cardiology by [name] with known history of chronic established atrial fibrillation. He is not on Coumadin because of recurrent GI bleeds.  He had a cardiac catheterization on 08/07/01 showing normal coronary arteries and an ejection fraction of 60-65%. He had an echocardiogram on 04/21/11 showing an ejection fraction of 60-65%, moderate mitral regurgitation, mild aortic insufficiency, moderate tricuspid regurgitation, and pulmonary artery pressure was 46 mmHg. he has mild chronic pretibial edema. He has chronic mild pul monary vascular congestion with basilar rales.  He had a Lexiscan Myoview on 01/23/13 showing no ischemia. The study was not gated because of his atrial fibrillation. The patient had an echocardiogram 05/30/13 showing an ejection fraction of 55-60%.  He was admitted to the hospitalist service on 10/21 for Sepsis, acute respiratory failure, and suspected PNA.  He presented to the ED with shortness of breath, tachycardia, tachypnea, and hypoxia.  He had CXR with suspected pericardial effusion and cardiomegaly.  He had routine TTE ordered today which revealed large circumferential effusion.  The hospitalist team was contacted with results and consult placed.  I have spoken with patient and family (son) at bedside about findings.     Review of Systems Patient is demented, but denies any chest pain or shortness of breath.  Past Medical History  Diagnosis Date  . Arrhythmia     a fib  . ED (erectile dysfunction)   . Memory disorder   . 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 Hematochezia     has intermittent problem  . Arthritis     DJD knee - left  . Restless leg     treats with heat  . Diabetes mellitus     NIDDM  on oral medication  . Genital warts     recurrent. Not a problem  . GERD (gastroesophageal reflux disease)     treated with PPI  . Hypertension     mild  . Neuropathy (HCC)     burning discomfort feet/legs  . Irritable bladder   . Hard of hearing   . Bladder cancer (HCC)     BCG [name]    Past Surgical History  Procedure Laterality Date  . Cardiovascular stress test  03/28/2003    EF 55%.  Normal cardiolite study  . Appendectomy  1946  . Tonsillectomy  1938  . Joint replacement  1990's    left TKR  . Knee arthroscopy  1980's    right knee    Prescriptions prior to admission  Medication Sig Dispense Refill Last Dose  . aspirin 81 MG tablet Take 81 mg by mouth daily.   01/10/2015 at Unknown time  . donepezil (ARICEPT) 10 MG tablet Take 1 tablet (10 mg total) by mouth at bedtime. 90 tablet 3 01/09/2015 at Unknown time  . ferrous sulfate 325 (65 FE) MG tablet Take 487.5 mg by mouth 2 (two) times daily.    01/10/2015 at Unknown time  . folic acid (FOLVITE) 1 MG tablet Take 1 mg by mouth 2 (two) times daily.    01/10/2015 at Unknown time  . furosemide (LASIX) 40 MG tablet Take 60 mg by mouth daily. 1 and 1/2 tablets by mouth daily   01/10/2015 at Unknown time  . gabapentin (NEURONTIN)  100 MG capsule Take 100 mg by mouth at bedtime.   01/09/2015 at Unknown time  . hydrOXYzine (ATARAX/VISTARIL) 10 MG tablet TAKE (1) TABLET TWICE A DAY AS NEEDED. (Patient taking differently: TAKE 5 MG BY MOUTH TWICE DAILY) 60 tablet 0 01/10/2015 at Unknown time  . memantine (NAMENDA XR) 28 MG CP24 24 hr capsule Take 1 capsule (28 mg total) by mouth daily. 90 capsule 3 01/10/2015 at Unknown time  . metoprolol tartrate (LOPRESSOR) 25 MG tablet TAKE 1/2 BY MOUTH EVERY MORNING (GENERIC FOR LOPRESSOR) 45 tablet 3 01/10/2015 at 800  . Multiple Vitamin (MULTIVITAMIN WITH MINERALS) TABS tablet Take 1 tablet by mouth daily.   01/10/2015 at Unknown time  . Multiple Vitamins-Minerals (PRESERVISION AREDS 2 PO)  Take 1 tablet by mouth 2 (two) times daily.    01/10/2015 at Unknown time  . nitroGLYCERIN (NITROSTAT) 0.4 MG SL tablet Place 1 tablet (0.4 mg total) under the tongue every 5 (five) minutes as needed for chest pain. 100 tablet 0 unknown  . solifenacin (VESICARE) 5 MG tablet Take 1 tablet (5 mg total) by mouth daily. 90 tablet 3 01/10/2015 at Unknown time  . triamcinolone cream (KENALOG) 0.1 % Apply 1 application topically 2 (two) times daily. (Patient not taking: Reported on 01/10/2015) 30 g 0 Not Taking at Unknown time   Allergies  Allergen Reactions  . Cephalexin Other (See Comments)    Unknown reaction  . Furacin [Nitrofurazone] Other (See Comments)    Unknown reaction  . Macrodantin Other (See Comments)    Unknown reaction  . Zestril [Lisinopril] Other (See Comments)    Unknown reaction    Social History  Substance Use Topics  . Smoking status: Former Smoker    Quit date: 08/28/1975  . Smokeless tobacco: Former Systems developer  . Alcohol Use: 4.2 oz/week    7 Shots of liquor per week    Family History  Problem Relation Age of Onset  . Kidney disease Mother   . Heart disease Mother   . Stroke Mother   . Cancer Father     prostate  . Diabetes Neg Hx   . COPD Neg Hx   . Mental illness Brother      Objective: Filed Vitals:   01/11/15 0536  BP: 114/59  Pulse: 95  Temp: 97.6 F (36.4 C)  Resp: 18   General appearance: alert, cooperative, appears stated age and no distress Head: Normocephalic, without obvious abnormality, atraumatic Eyes: PERRL, EOM's intact. Lungs: non labored breathing, no wheezing Chest wall: no tenderness Heart: irregular  rate and rhythm, no rubs Abdomen: soft, non-tender; bowel sounds normal Extremities: no edema Pulses: 2+ and symmetric  Results for orders placed or performed during the hospital encounter of 01/10/15 (from the past 48 hour(s))  I-stat troponin, ED     Status: None   Collection Time: 01/10/15 12:31 PM  Result Value Ref Range    Troponin i, poc 0.02 0.00 - 0.08 ng/mL   Comment 3            Comment: Due to the release kinetics of cTnI, a negative result within the first hours of the onset of symptoms does not rule out myocardial infarction with certainty. If myocardial infarction is still suspected, repeat the test at appropriate intervals.   I-Stat CG4 Lactic Acid, ED     Status: None   Collection Time: 01/10/15 12:34 PM  Result Value Ref Range   Lactic Acid, Venous 1.22 0.5 - 2.0 mmol/L  CBC with Differential/Platelet  Status: Abnormal   Collection Time: 01/10/15 12:35 PM  Result Value Ref Range   WBC 7.9 4.0 - 10.5 K/uL   RBC 4.40 4.22 - 5.81 MIL/uL   Hemoglobin 11.5 (L) 13.0 - 17.0 g/dL   HCT 36.8 (L) 39.0 - 52.0 %   MCV 83.6 78.0 - 100.0 fL   MCH 26.1 26.0 - 34.0 pg   MCHC 31.3 30.0 - 36.0 g/dL   RDW 15.8 (H) 11.5 - 15.5 %   Platelets 238 150 - 400 K/uL   Neutrophils Relative % 87 %   Neutro Abs 6.8 1.7 - 7.7 K/uL   Lymphocytes Relative 3 %   Lymphs Abs 0.3 (L) 0.7 - 4.0 K/uL   Monocytes Relative 10 %   Monocytes Absolute 0.8 0.1 - 1.0 K/uL   Eosinophils Relative 0 %   Eosinophils Absolute 0.0 0.0 - 0.7 K/uL   Basophils Relative 0 %   Basophils Absolute 0.0 0.0 - 0.1 K/uL  Comprehensive metabolic panel     Status: Abnormal   Collection Time: 01/10/15 12:35 PM  Result Value Ref Range   Sodium 134 (L) 135 - 145 mmol/L   Potassium 4.5 3.5 - 5.1 mmol/L   Chloride 96 (L) 101 - 111 mmol/L   CO2 31 22 - 32 mmol/L   Glucose, Bld 184 (H) 65 - 99 mg/dL   BUN 11 6 - 20 mg/dL   Creatinine, Ser 0.91 0.61 - 1.24 mg/dL   Calcium 8.8 (L) 8.9 - 10.3 mg/dL   Total Protein 6.9 6.5 - 8.1 g/dL   Albumin 3.0 (L) 3.5 - 5.0 g/dL   AST 25 15 - 41 U/L   ALT 20 17 - 63 U/L   Alkaline Phosphatase 170 (H) 38 - 126 U/L   Total Bilirubin 0.5 0.3 - 1.2 mg/dL   GFR calc non Af Amer >60 >60 mL/min   GFR calc Af Amer >60 >60 mL/min    Comment: (NOTE) The eGFR has been calculated using the CKD EPI equation. This  calculation has not been validated in all clinical situations. eGFR's persistently <60 mL/min signify possible Chronic Kidney Disease.    Anion gap 7 5 - 15  Brain natriuretic peptide     Status: None   Collection Time: 01/10/15 12:36 PM  Result Value Ref Range   B Natriuretic Peptide 91.6 0.0 - 100.0 pg/mL  Urinalysis, Routine w reflex microscopic (not at [facility])     Status: Abnormal   Collection Time: 01/10/15  2:24 PM  Result Value Ref Range   Color, Urine AMBER (A) YELLOW    Comment: BIOCHEMICALS MAY BE AFFECTED BY COLOR   APPearance HAZY (A) CLEAR   Specific Gravity, Urine 1.017 1.005 - 1.030   pH 6.0 5.0 - 8.0   Glucose, UA NEGATIVE NEGATIVE mg/dL   Hgb urine dipstick NEGATIVE NEGATIVE   Bilirubin Urine NEGATIVE NEGATIVE   Ketones, ur NEGATIVE NEGATIVE mg/dL   Protein, ur NEGATIVE NEGATIVE mg/dL   Urobilinogen, UA 0.2 0.0 - 1.0 mg/dL   Nitrite NEGATIVE NEGATIVE   Leukocytes, UA MODERATE (A) NEGATIVE  Urine microscopic-add on     Status: Abnormal   Collection Time: 01/10/15  2:24 PM  Result Value Ref Range   Squamous Epithelial / LPF FEW (A) RARE   WBC, UA 11-20 <3 WBC/hpf   Bacteria, UA FEW (A) RARE   Casts HYALINE CASTS (A) NEGATIVE   Urine-Other MUCOUS PRESENT   Culture, Urine     Status: None (Preliminary result)  Collection Time: 01/10/15  2:24 PM  Result Value Ref Range   Specimen Description URINE, RANDOM    Special Requests NONE    Culture CULTURE REINCUBATED FOR BETTER GROWTH    Report Status PENDING   Ammonia     Status: None   Collection Time: 01/10/15  5:14 PM  Result Value Ref Range   Ammonia 10 9 - 35 umol/L  HIV antibody     Status: None   Collection Time: 01/10/15  6:44 PM  Result Value Ref Range   HIV Screen 4th Generation wRfx Non Reactive Non Reactive    Comment: (NOTE) Performed At: [facility] [place], [place] [id number] Lindon Romp MD [id number]   Magnesium     Status: None   Collection Time: 01/10/15   6:44 PM  Result Value Ref Range   Magnesium 2.1 1.7 - 2.4 mg/dL   Dg Chest 2 View  01/11/2015  CLINICAL DATA:  Acute respiratory failure. EXAM: CHEST  2 VIEW COMPARISON:  01/10/2015 FINDINGS: Marked cardiomegaly is unchanged from yesterday's study but significantly increased from 07/16/2014. Thoracic aortic calcification is noted. Retrocardiac opacity in the left lower lobe is unchanged with hazy density on the lateral projection. There is at most a small left pleural effusion. There is minimal right basilar atelectasis. No overt pulmonary edema. No acute osseous abnormality identified. IMPRESSION: 1. Marked cardiomegaly with a component of pericardial effusion suspected. 2. Unchanged left lower lobe opacity favored to represent atelectasis although pneumonia is not excluded. 3. Likely small left pleural effusion. Electronically Signed   By: Logan Bores M.D.   On: 01/11/2015 09:26   Dg Chest Portable 1 View  01/10/2015  CLINICAL DATA:  Altered mental status and shortness of breath. EXAM: PORTABLE CHEST 1 VIEW COMPARISON:  07/16/2014 and 02/28/2014 FINDINGS: Lungs are adequately inflated with opacification over the left base/ retrocardiac region which may be due to effusion with atelectasis versus infection. Right lung is clear. There is marked cardiomegaly slightly worse as cannot exclude a component of pericardial fluid. Calcified plaque present over the aortic arch. Degenerative changes of the spine. IMPRESSION: Opacification over the left base/ retrocardiac region which may be due to effusion/atelectasis although cannot exclude infection. PA and lateral chest x-ray would be better for evaluation of the left base as recommend follow-up to resolution. Marked cardiomegaly slightly worse as cannot exclude a component of pericardial fluid. Electronically Signed   By: Marin Olp M.D.   On: 01/10/2015 12:48    ECG:  HR 106 atrial fibrillation, low voltage, prolonged QT inteval  Impression: Large  circumferential pericardial effusion Suspected PNA HFPEF Chronic AFib not on anticoagulation 2/2 GI Bleed Chronic anemia Advanced dementia  Mr. Baba appears to be hemodynamically stable (normal BP, RR, HR) at this time.  He is resting comfortably with family at bedside.  Recommendations: -I have discussed this case with on call Intervention Cardiologists Dr. Fletcher Anon.  Given patient's current clinical status, we believe he does not need urgent intervention tonight. -We will reassess in am and make further plans as indicated. -Continue to monitor on telemetry -Hold Lasix for now -Discussed plan with [facility] NP [place] -Discussed plan with RN at bedside.  Thank you for this consult.  We will be happy to follow along with you.

## 2015-01-11 NOTE — Progress Notes (Signed)
[facility] PROGRESS NOTE    Progress Note   [name] UTM:[id number] DOB: 16-Sep-1927 DOA: 01/10/2015 PCP: [name], NP   Brief Narrative:   [name] is an 81 y.o. male male with past medical history of chronic atrial fibrillation not on anticoagulation secondary to GI bleed brought to the hospital because of increase shortness of breath and confusion.   Assessment/Plan:   Acute respiratory failure with hypoxia: Patient with a low-grade fever, no leukocytosis. We'll start her empirically on IV Rocephin and doxycycline for possible community-acquired pneumonia versus aspiration pneumonia. Saturations have remained greater than 90% on nasal cannula.  Sepsis: Unclear etiology of source, may be respiratory versus urinary. Started empirically on IV Rocephin awaiting urine cultures.  Chronic  Atrial fibrillation [facility]): Rate control on beta blocker candidate for anticoagulation due to history of GI bleed.  Chronic systolic heart failure: Currently compensated. Continue Lasix beta blocker. 2-D echo was obtained as PA and lateral chest x-ray showed the possibility of pericardial effusion.  Acute encephalopathy with underlying diagnosis of dementia: On Namenda and Aricept likely due to infectious etiology.  Prolonged QT on EKG: Magnesium and potassium are within normal. QTC mildly improved from 580 -564.   DVT Prophylaxis - Lovenox ordered.  Family Communication: Spoke with Lestine Mount who is at bedside. He understands plan of care.  Disposition Plan: Home when stable. Code Status:     Code Status Orders        Start     Ordered   01/10/15 5681  Full code   Continuous     01/10/15 1715        IV Access:    Peripheral IV   Procedures and diagnostic studies:   Dg Chest 2 View  01/11/2015  CLINICAL DATA:  Acute respiratory failure. EXAM: CHEST  2 VIEW COMPARISON:  01/10/2015 FINDINGS: Marked cardiomegaly is unchanged from yesterday's  study but significantly increased from 07/16/2014. Thoracic aortic calcification is noted. Retrocardiac opacity in the left lower lobe is unchanged with hazy density on the lateral projection. There is at most a small left pleural effusion. There is minimal right basilar atelectasis. No overt pulmonary edema. No acute osseous abnormality identified. IMPRESSION: 1. Marked cardiomegaly with a component of pericardial effusion suspected. 2. Unchanged left lower lobe opacity favored to represent atelectasis although pneumonia is not excluded. 3. Likely small left pleural effusion. Electronically Signed   By: Logan Bores M.D.   On: 01/11/2015 09:26   Dg Chest Portable 1 View  01/10/2015  CLINICAL DATA:  Altered mental status and shortness of breath. EXAM: PORTABLE CHEST 1 VIEW COMPARISON:  07/16/2014 and 02/28/2014 FINDINGS: Lungs are adequately inflated with opacification over the left base/ retrocardiac region which may be due to effusion with atelectasis versus infection. Right lung is clear. There is marked cardiomegaly slightly worse as cannot exclude a component of pericardial fluid. Calcified plaque present over the aortic arch. Degenerative changes of the spine. IMPRESSION: Opacification over the left base/ retrocardiac region which may be due to effusion/atelectasis although cannot exclude infection. PA and lateral chest x-ray would be better for evaluation of the left base as recommend follow-up to resolution. Marked cardiomegaly slightly worse as cannot exclude a component of pericardial fluid. Electronically Signed   By: Marin Olp M.D.   On: 01/10/2015 12:48     Medical Consultants:    None.  Anti-Infectives:   Anti-infectives    Start     Dose/Rate Route Frequency Ordered Stop   01/10/15 2200  doxycycline (VIBRA-TABS) tablet 100 mg     100 mg Oral Every 12 hours 01/10/15 1715 01/17/15 2159   01/10/15 1730  cefTRIAXone (ROCEPHIN) 1 g in dextrose 5 % 50 mL IVPB     1 g 100 mL/hr over  30 Minutes Intravenous Every 24 hours 01/10/15 1715 01/17/15 1729   01/10/15 1415  levofloxacin (LEVAQUIN) IVPB 750 mg     750 mg 100 mL/hr over 90 Minutes Intravenous  Once 01/10/15 1402 01/10/15 1604      Subjective:    [name] patient relates he has no recollection of coming to the hospital. He relates he has been incontinence for the last few days for come to the hospital.  Objective:    Filed Vitals:   01/10/15 1615 01/10/15 1657 01/10/15 2227 01/11/15 0536  BP: 126/68 114/67 126/65 114/59  Pulse: 28 95 91 95  Temp:  97.6 F (36.4 C) 98.1 F (36.7 C) 97.6 F (36.4 C)  TempSrc:  Oral Oral Oral  Resp: 21 22 20 18   Height:  6' (1.829 m)    Weight:  98.2 kg (216 lb 7.9 oz)    SpO2: 95% 96% 98% 96%    Intake/Output Summary (Last 24 hours) at 01/11/15 1028 Last data filed at 01/11/15 0933  Gross per 24 hour  Intake    303 ml  Output    250 ml  Net     53 ml   Filed Weights   01/10/15 1657  Weight: 98.2 kg (216 lb 7.9 oz)    Exam: Gen:  NAD Cardiovascular:  RRR, No M/R/G Chest and lungs:   CTAB Abdomen:  Abdomen soft, NT/ND, + BS Extremities:  No C/E/C   Data Reviewed:    Labs: Basic Metabolic Panel:  Recent Labs Lab 01/09/15 1422 01/10/15 1235 01/10/15 1844  NA 136 134*  --   K 4.4 4.5  --   CL 97* 96*  --   CO2 29 31  --   GLUCOSE 181* 184*  --   BUN 12 11  --   CREATININE 0.80 0.91  --   CALCIUM 9.0 8.8*  --   MG  --   --  2.1   GFR Estimated Creatinine Clearance: 72 mL/min (by C-G formula based on Cr of 0.91). Liver Function Tests:  Recent Labs Lab 01/10/15 1235  AST 25  ALT 20  ALKPHOS 170*  BILITOT 0.5  PROT 6.9  ALBUMIN 3.0*   No results for input(s): LIPASE, AMYLASE in the last 168 hours.  Recent Labs Lab 01/10/15 1714  AMMONIA 10   Coagulation profile No results for input(s): INR, PROTIME in the last 168 hours.  CBC:  Recent Labs Lab 01/09/15 1422 01/10/15 1235  WBC 7.0 7.9  NEUTROABS 5.4 6.8  HGB  10.8* 11.5*  HCT 33.7* 36.8*  MCV 81.2 83.6  PLT 281 238   Cardiac Enzymes: No results for input(s): CKTOTAL, CKMB, CKMBINDEX, TROPONINI in the last 168 hours. BNP (last 3 results) No results for input(s): PROBNP in the last 8760 hours. CBG: No results for input(s): GLUCAP in the last 168 hours. D-Dimer: No results for input(s): DDIMER in the last 72 hours. Hgb A1c: No results for input(s): HGBA1C in the last 72 hours. Lipid Profile: No results for input(s): CHOL, HDL, LDLCALC, TRIG, CHOLHDL, LDLDIRECT in the last 72 hours. Thyroid function studies: No results for input(s): TSH, T4TOTAL, T3FREE, THYROIDAB in the last 72 hours.  Invalid input(s): FREET3 Anemia work up: No results  for input(s): VITAMINB12, FOLATE, FERRITIN, TIBC, IRON, RETICCTPCT in the last 72 hours. Sepsis Labs:  Recent Labs Lab 01/09/15 1422 01/10/15 1234 01/10/15 1235  WBC 7.0  --  7.9  LATICACIDVEN  --  1.22  --    Microbiology No results found for this or any previous visit (from the past 240 hour(s)).   Medications:   . aspirin  81 mg Oral Daily  . cefTRIAXone (ROCEPHIN)  IV  1 g Intravenous Q24H  . darifenacin  7.5 mg Oral Daily  . donepezil  10 mg Oral QHS  . doxycycline  100 mg Oral Q12H  . furosemide  60 mg Oral Daily  . gabapentin  100 mg Oral QHS  . hydrOXYzine  5 mg Oral BID  . [START ON 01/12/2015] Influenza vac split quadrivalent PF  0.5 mL Intramuscular Tomorrow-1000  . memantine  28 mg Oral Daily  . metoprolol tartrate  12.5 mg Oral q morning - 10a  . sodium chloride  3 mL Intravenous Q12H   Continuous Infusions:   Time spent: 25 min   LOS: 1 day   Charlynne Cousins  [facility] Pager [phone]  *Please refer to [email or web address], password [email or web address] to get updated schedule on who will round on this patient, as hospitalists switch teams weekly. If 7PM-7AM, please contact night-coverage at [email or web address], password [email or web address] for any overnight needs.  01/11/2015, 10:28 AM

## 2015-01-11 NOTE — Progress Notes (Deleted)
  Echocardiogram 2D Echocardiogram has been performed.  [name] 01/11/2015, 2:53 PM

## 2015-01-12 DIAGNOSIS — I314 Cardiac tamponade: Secondary | ICD-10-CM

## 2015-01-12 DIAGNOSIS — A419 Sepsis, unspecified organism: Secondary | ICD-10-CM | POA: Diagnosis not present

## 2015-01-12 DIAGNOSIS — G9340 Encephalopathy, unspecified: Secondary | ICD-10-CM

## 2015-01-12 DIAGNOSIS — I313 Pericardial effusion (noninflammatory): Secondary | ICD-10-CM | POA: Diagnosis present

## 2015-01-12 DIAGNOSIS — I319 Disease of pericardium, unspecified: Secondary | ICD-10-CM

## 2015-01-12 DIAGNOSIS — I3139 Other pericardial effusion (noninflammatory): Secondary | ICD-10-CM | POA: Diagnosis present

## 2015-01-12 DIAGNOSIS — I4891 Unspecified atrial fibrillation: Secondary | ICD-10-CM | POA: Insufficient documentation

## 2015-01-12 DIAGNOSIS — J9601 Acute respiratory failure with hypoxia: Secondary | ICD-10-CM

## 2015-01-12 LAB — CBC
HCT: 35.6 % — ABNORMAL LOW (ref 39.0–52.0)
Hemoglobin: 10.8 g/dL — ABNORMAL LOW (ref 13.0–17.0)
MCH: 25.2 pg — ABNORMAL LOW (ref 26.0–34.0)
MCHC: 30.3 g/dL (ref 30.0–36.0)
MCV: 83.2 fL (ref 78.0–100.0)
PLATELETS: 247 10*3/uL (ref 150–400)
RBC: 4.28 MIL/uL (ref 4.22–5.81)
RDW: 16.1 % — AB (ref 11.5–15.5)
WBC: 7.5 10*3/uL (ref 4.0–10.5)

## 2015-01-12 LAB — BASIC METABOLIC PANEL
ANION GAP: 9 (ref 5–15)
BUN: 15 mg/dL (ref 6–20)
CALCIUM: 8.7 mg/dL — AB (ref 8.9–10.3)
CO2: 31 mmol/L (ref 22–32)
CREATININE: 0.9 mg/dL (ref 0.61–1.24)
Chloride: 98 mmol/L — ABNORMAL LOW (ref 101–111)
GFR calc Af Amer: >60 mL/min (ref >60)
GLUCOSE: 142 mg/dL — AB (ref 65–99)
Potassium: 4.2 mmol/L (ref 3.5–5.1)
Sodium: 138 mmol/L (ref 135–145)

## 2015-01-12 LAB — PROTIME-INR
INR: 1.46 (ref 0.00–1.49)
Prothrombin Time: 17.8 seconds — ABNORMAL HIGH (ref 11.6–15.2)

## 2015-01-12 LAB — GLUCOSE, CAPILLARY: GLUCOSE-CAPILLARY: 131 mg/dL — AB (ref 65–99)

## 2015-01-12 LAB — URINE CULTURE

## 2015-01-12 MED ORDER — SODIUM CHLORIDE 0.9 % IV SOLN
INTRAVENOUS | Status: DC
  Administered 2015-01-12 – 2015-01-13 (×2): via INTRAVENOUS

## 2015-01-12 MED ORDER — METOPROLOL TARTRATE 1 MG/ML IV SOLN
2.5000 mg | Freq: Four times a day (QID) | INTRAVENOUS | Status: DC | PRN
  Administered 2015-01-13 – 2015-01-14 (×2): 2.5 mg via INTRAVENOUS
  Filled 2015-01-12 (×3): qty 5

## 2015-01-12 MED ORDER — SODIUM CHLORIDE 0.9 % IV BOLUS (SEPSIS)
500.0000 mL | Freq: Once | INTRAVENOUS | Status: DC
  Administered 2015-01-12: 500 mL via INTRAVENOUS

## 2015-01-12 MED ORDER — RESOURCE THICKENUP CLEAR PO POWD
ORAL | Status: DC | PRN
  Filled 2015-01-12: qty 125

## 2015-01-12 MED ORDER — SODIUM CHLORIDE 0.9 % IV BOLUS (SEPSIS): 250.0000 mL | Freq: Once | INTRAVENOUS | Status: DC

## 2015-01-12 NOTE — Progress Notes (Addendum)
[facility] PROGRESS NOTE    Progress Note   [name] ZTI:[id number] DOB: Dec 30, 1927 DOA: 01/10/2015 PCP: [name], NP   Brief Narrative:   [name] is an 81 y.o. male male with past medical history of chronic atrial fibrillation not on anticoagulation secondary to GI bleed brought to the hospital because of increase shortness of breath and confusion.   Assessment/Plan:  Acute respiratory failure with hypoxia likely due to large pericardial effusion: Afebrile, continue empirically on IV Rocephin and doxycycline for possible community-acquired pneumonia versus aspiration pneumonia.  2-D echo showed large pericardial effusion Consulted cardiology, due to large pericardial effusion, we'll get PT and INR, transfer to step down.  Sepsis: Unclear etiology of source, may be respiratory versus urinary. Started empirically on IV Rocephin awaiting urine cultures.  Chronic  Atrial fibrillation [facility]): Now with A. fib with RVR,  Continue metoprolol IV for heart rate greater than 110.  Chronic systolic heart failure: Currently compensated. Continue Lasix  And beta blocker. 2-D echo is pending.  Acute encephalopathy with underlying diagnosis of dementia: On Namenda and Aricept likely due to infectious etiology.  Prolonged QT on EKG: Magnesium and potassium are within normal. QTC mildly improved from 580 -564.   DVT Prophylaxis - Lovenox ordered.  Family Communication: Spoke with Lestine Mount who is at bedside. He understands plan of care.  Disposition Plan: Home when stable. Code Status:     Code Status Orders        Start     Ordered   01/10/15 8250  Full code   Continuous     01/10/15 1715        IV Access:    Peripheral IV   Procedures and diagnostic studies:   Dg Chest 2 View  01/11/2015  CLINICAL DATA:  Acute respiratory failure. EXAM: CHEST  2 VIEW COMPARISON:  01/10/2015 FINDINGS: Marked cardiomegaly is unchanged from yesterday's  study but significantly increased from 07/16/2014. Thoracic aortic calcification is noted. Retrocardiac opacity in the left lower lobe is unchanged with hazy density on the lateral projection. There is at most a small left pleural effusion. There is minimal right basilar atelectasis. No overt pulmonary edema. No acute osseous abnormality identified. IMPRESSION: 1. Marked cardiomegaly with a component of pericardial effusion suspected. 2. Unchanged left lower lobe opacity favored to represent atelectasis although pneumonia is not excluded. 3. Likely small left pleural effusion. Electronically Signed   By: Logan Bores M.D.   On: 01/11/2015 09:26   Dg Chest Portable 1 View  01/10/2015  CLINICAL DATA:  Altered mental status and shortness of breath. EXAM: PORTABLE CHEST 1 VIEW COMPARISON:  07/16/2014 and 02/28/2014 FINDINGS: Lungs are adequately inflated with opacification over the left base/ retrocardiac region which may be due to effusion with atelectasis versus infection. Right lung is clear. There is marked cardiomegaly slightly worse as cannot exclude a component of pericardial fluid. Calcified plaque present over the aortic arch. Degenerative changes of the spine. IMPRESSION: Opacification over the left base/ retrocardiac region which may be due to effusion/atelectasis although cannot exclude infection. PA and lateral chest x-ray would be better for evaluation of the left base as recommend follow-up to resolution. Marked cardiomegaly slightly worse as cannot exclude a component of pericardial fluid. Electronically Signed   By: Marin Olp M.D.   On: 01/10/2015 12:48     Medical Consultants:    None.  Anti-Infectives:   Anti-infectives    Start     Dose/Rate Route Frequency Ordered Stop  01/10/15 2200  doxycycline (VIBRA-TABS) tablet 100 mg     100 mg Oral Every 12 hours 01/10/15 1715 01/17/15 2159   01/10/15 1730  cefTRIAXone (ROCEPHIN) 1 g in dextrose 5 % 50 mL IVPB     1 g 100 mL/hr over  30 Minutes Intravenous Every 24 hours 01/10/15 1715 01/17/15 1729   01/10/15 1415  levofloxacin (LEVAQUIN) IVPB 750 mg     750 mg 100 mL/hr over 90 Minutes Intravenous  Once 01/10/15 1402 01/10/15 1604      Subjective:    [name] he relates and denies any chest pain or shortness of breath. He was in RVR overnight. He relates he remained asymptomatic.  Objective:    Filed Vitals:   01/11/15 2234 01/11/15 2235 01/12/15 0634 01/12/15 0823  BP: 124/76  124/58 119/82  Pulse: 83 105 120 106  Temp: 99.3 F (37.4 C)  99.6 F (37.6 C) 98.2 F (36.8 C)  TempSrc: Oral  Oral Oral  Resp: 18  20 30   Height:      Weight:      SpO2: 89% 90% 94% 90%    Intake/Output Summary (Last 24 hours) at 01/12/15 0906 Last data filed at 01/12/15 0745  Gross per 24 hour  Intake    350 ml  Output     50 ml  Net    300 ml   Filed Weights   01/10/15 1657  Weight: 98.2 kg (216 lb 7.9 oz)    Exam: Gen:  NAD Cardiovascular:  Irregular rate and rhythm Chest and lungs:   Good air movement with crackles on the right. No wheezing rails. Abdomen:  Abdomen soft, NT/ND, + BS Extremities:  No C/E/C   Data Reviewed:    Labs: Basic Metabolic Panel:  Recent Labs Lab 01/09/15 1422 01/10/15 1235 01/10/15 1844  NA 136 134*  --   K 4.4 4.5  --   CL 97* 96*  --   CO2 29 31  --   GLUCOSE 181* 184*  --   BUN 12 11  --   CREATININE 0.80 0.91  --   CALCIUM 9.0 8.8*  --   MG  --   --  2.1   GFR Estimated Creatinine Clearance: 72 mL/min (by C-G formula based on Cr of 0.91). Liver Function Tests:  Recent Labs Lab 01/10/15 1235  AST 25  ALT 20  ALKPHOS 170*  BILITOT 0.5  PROT 6.9  ALBUMIN 3.0*   No results for input(s): LIPASE, AMYLASE in the last 168 hours.  Recent Labs Lab 01/10/15 1714  AMMONIA 10   Coagulation profile No results for input(s): INR, PROTIME in the last 168 hours.  CBC:  Recent Labs Lab 01/09/15 1422 01/10/15 1235  WBC 7.0 7.9  NEUTROABS 5.4 6.8  HGB  10.8* 11.5*  HCT 33.7* 36.8*  MCV 81.2 83.6  PLT 281 238   Cardiac Enzymes: No results for input(s): CKTOTAL, CKMB, CKMBINDEX, TROPONINI in the last 168 hours. BNP (last 3 results) No results for input(s): PROBNP in the last 8760 hours. CBG:  Recent Labs Lab 01/12/15 0819  GLUCAP 131*   D-Dimer: No results for input(s): DDIMER in the last 72 hours. Hgb A1c: No results for input(s): HGBA1C in the last 72 hours. Lipid Profile: No results for input(s): CHOL, HDL, LDLCALC, TRIG, CHOLHDL, LDLDIRECT in the last 72 hours. Thyroid function studies: No results for input(s): TSH, T4TOTAL, T3FREE, THYROIDAB in the last 72 hours.  Invalid input(s): FREET3 Anemia work up:  No results for input(s): VITAMINB12, FOLATE, FERRITIN, TIBC, IRON, RETICCTPCT in the last 72 hours. Sepsis Labs:  Recent Labs Lab 01/09/15 1422 01/10/15 1234 01/10/15 1235  WBC 7.0  --  7.9  LATICACIDVEN  --  1.22  --    Microbiology Recent Results (from the past 240 hour(s))  Culture, Urine     Status: None (Preliminary result)   Collection Time: 01/10/15  2:24 PM  Result Value Ref Range Status   Specimen Description URINE, RANDOM  Final   Special Requests NONE  Final   Culture CULTURE REINCUBATED FOR BETTER GROWTH  Final   Report Status PENDING  Incomplete     Medications:   . aspirin  81 mg Oral Daily  . cefTRIAXone (ROCEPHIN)  IV  1 g Intravenous Q24H  . darifenacin  7.5 mg Oral Daily  . donepezil  10 mg Oral QHS  . doxycycline  100 mg Oral Q12H  . furosemide  60 mg Oral Daily  . gabapentin  100 mg Oral QHS  . hydrOXYzine  5 mg Oral BID  . Influenza vac split quadrivalent PF  0.5 mL Intramuscular Tomorrow-1000  . memantine  28 mg Oral Daily  . metoprolol tartrate  12.5 mg Oral q morning - 10a  . sodium chloride  3 mL Intravenous Q12H   Continuous Infusions:   Time spent: 25 min   LOS: 2 days   Charlynne Cousins  [facility] Pager [phone]  *Please refer to [email or web address],  password [email or web address] to get updated schedule on who will round on this patient, as hospitalists switch teams weekly. If 7PM-7AM, please contact night-coverage at [email or web address], password [email or web address] for any overnight needs.  01/12/2015, 9:06 AM

## 2015-01-12 NOTE — Progress Notes (Signed)
01/12/2015 11:40 AM Nursing note Dr. Martinique at bedside with son [facility]). Verbal orders received to obtain consent for Pericardiocentesis by Dr. Fletcher Anon due to pericardial effusion. Pt. With hx of dementia and lethargic at this time. Consent obtained from son per protocol.  [name], Arville Lime

## 2015-01-12 NOTE — Progress Notes (Signed)
01/12/15 Report called to 2 [place] about patient going to unit for closer monitoring. Family notified of transfer to [place].

## 2015-01-12 NOTE — Evaluation (Signed)
Clinical/Bedside Swallow Evaluation Patient Details  Name: [name] MRN: [id number] Date of Birth: 11-18-1927  Today's Date: 01/12/2015 Time: SLP Start Time (ACUTE ONLY): 1440 SLP Stop Time (ACUTE ONLY): 1508 SLP Time Calculation (min) (ACUTE ONLY): 28 min  Past Medical History:  Past Medical History  Diagnosis Date  . Arrhythmia     a fib  . ED (erectile dysfunction)   . Memory disorder   . 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 Hematochezia     has intermittent problem  . Arthritis     DJD knee - left  . Restless leg     treats with heat  . Diabetes mellitus     NIDDM on oral medication  . Genital warts     recurrent. Not a problem  . GERD (gastroesophageal reflux disease)     treated with PPI  . Hypertension     mild  . Neuropathy (HCC)     burning discomfort feet/legs  . Irritable bladder   . Hard of hearing   . Bladder cancer ([place])     BCG [name]   Past Surgical History:  Past Surgical History  Procedure Laterality Date  . Cardiovascular stress test  03/28/2003    EF 55%.  Normal cardiolite study  . Appendectomy  1946  . Tonsillectomy  1938  . Joint replacement  1990's    left TKR  . Knee arthroscopy  1980's    right knee   HPI:  81 yo male adm to [facility] with respiratory deficits.  PMH + for cognitive/memory deficits, Afib, bladder cancer, DM, arthritis, GERD.  Swallow evaluation ordered due to concerns for possible dysphagia.  Pt CXR showed pericardial effusion suspected and left lobe opacity = could not rule out pna.     Assessment / Plan / Recommendation Clinical Impression  Pt presents with symptoms consistent with mild oral and suspected pharyngeal deficits likely primary due to cognitive deficit and generalized weakness.  Pt with open mouth posture at baseline - which dries oral cavity.  Moisture provided by SLP and pt was presented with po trials.  He was sleepy but woke to participate in po intake.    Decreased labial seal  noted across consistencies with pt requiring cues to swallow. Delayed oral transiting suspected with possible delayed pharyngeal swallow.  Decreased laryngeal elevation noted with swallowing, concerning for inadequacy of airway closure with thin liquids.  Immediate throat clearing noted after swallow of thin via tsp.  Concern for laryngeal penetration or aspiration with thinner consistencies as pt's cough is weak and non-protective.     To maximize airway safety with po, recommend to modify diet to dys3/nectar, allowing single ice chips.  Reviewed plan with pt for diet modification and swallow precautions, however his cognitive status may prohibit understanding, generalization.    No family present to educate. SLP to follow up for dietary tolerance.  Given pt with dementia diagnosis and concerns for pharyngeal deficits, recommend MBS when he is able to tolerate.      Aspiration Risk  Moderate    Diet Recommendation Dysphagia 3 (Mech soft);Nectar;Ice chips PRN after oral care   Medication Administration: Whole meds with puree Compensations: Small sips/bites;Slow rate    Other  Recommendations Oral Care Recommendations: Oral care BID Other Recommendations: Order thickener from pharmacy   Follow Up Recommendations     TBD  Frequency and Duration min 2x/week  2 weeks   Pertinent Vitals/Pain Low grade fever, decreased  Swallow Study Prior Functional Status   see hhx    General Date of Onset: 01/12/15 Other Pertinent Information: 81 yo male adm to [facility] with respiratory deficits.  PMH + for cognitive/memory deficits, Afib, bladder cancer, DM, arthritis, GERD.  Swallow evaluation ordered due to concerns for possible dysphagia.  Pt CXR showed pericardial effusion suspected and left lobe opacity = could not rule out pna.   Type of Study: Bedside swallow evaluation Previous Swallow Assessment: none seen in chart Diet Prior to this Study: Regular;Thin liquids Temperature Spikes Noted: Yes  (low grade ) Respiratory Status: Supplemental O2 delivered via (comment) History of Recent Intubation: No Behavior/Cognition: Cooperative;Pleasant mood;Confused (sleepy but participative, RN reports son states pt is sleepy all the time) Oral Cavity - Dentition: Edentulous (dentures in place) Self-Feeding Abilities: Needs assist Patient Positioning: Upright in bed Baseline Vocal Quality: Low vocal intensity Volitional Cough: Weak Volitional Swallow: Able to elicit (with oral moisture)    Oral/Motor/Sensory Function Overall Oral Motor/Sensory Function:  (? palatal deviation slightly to right upon phonation, pt leaning slightly left, generalized weakness)   Ice Chips Ice chips: Impaired Presentation: Spoon Oral Phase Impairments: Reduced labial seal Oral Phase Functional Implications: Prolonged oral transit Pharyngeal Phase Impairments: Suspected delayed Swallow;Decreased hyoid-laryngeal movement   Thin Liquid Thin Liquid: Impaired Presentation: Spoon Oral Phase Impairments: Reduced lingual movement/coordination;Impaired anterior to posterior transit;Reduced labial seal Oral Phase Functional Implications: Prolonged oral transit Pharyngeal  Phase Impairments: Throat Clearing - Immediate;Suspected delayed Swallow;Decreased hyoid-laryngeal movement    Nectar Thick Nectar Thick Liquid: Impaired Presentation: Cup;Spoon;Straw Oral Phase Impairments: Impaired anterior to posterior transit;Reduced labial seal Oral phase functional implications: Prolonged oral transit Pharyngeal Phase Impairments: Suspected delayed Swallow;Decreased hyoid-laryngeal movement   Honey Thick Honey Thick Liquid: Not tested   Puree Puree: Impaired Presentation: Spoon Oral Phase Impairments: Impaired anterior to posterior transit;Reduced labial seal Oral Phase Functional Implications: Prolonged oral transit Pharyngeal Phase Impairments: Suspected delayed Swallow;Decreased hyoid-laryngeal movement   Solid   GO     Solid: Impaired Oral Phase Impairments: Impaired anterior to posterior transit;Reduced lingual movement/coordination;Reduced labial seal Oral Phase Functional Implications:  (prolonged oral transiting) Pharyngeal Phase Impairments: Suspected delayed Swallow;Decreased hyoid-laryngeal movement       Luanna Salk, [place] [facility] SLP [phone]

## 2015-01-12 NOTE — Progress Notes (Addendum)
TELEMETRY: Reviewed telemetry pt in atrial fibrillation with RVR. Rate 115-140: Filed Vitals:   01/11/15 2234 01/11/15 2235 01/12/15 0634 01/12/15 0823  BP: 124/76  124/58 119/82  Pulse: 83 105 120 106  Temp: 99.3 F (37.4 C)  99.6 F (37.6 C) 98.2 F (36.8 C)  TempSrc: Oral  Oral Oral  Resp: 18  20 30   Height:      Weight:      SpO2: 89% 90% 94% 90%    Intake/Output Summary (Last 24 hours) at 01/12/15 0958 Last data filed at 01/12/15 0941  Gross per 24 hour  Intake     50 ml  Output     50 ml  Net      0 ml   Filed Weights   01/10/15 1657  Weight: 98.2 kg (216 lb 7.9 oz)    Subjective Patient feels OK. Denies any dyspnea and appears fairly comfortable. Feels urge to urinate with foley in place.   [name] aspirin  81 mg Oral Daily  . cefTRIAXone (ROCEPHIN)  IV  1 g Intravenous Q24H  . darifenacin  7.5 mg Oral Daily  . donepezil  10 mg Oral QHS  . doxycycline  100 mg Oral Q12H  . gabapentin  100 mg Oral QHS  . hydrOXYzine  5 mg Oral BID  . Influenza vac split quadrivalent PF  0.5 mL Intramuscular Tomorrow-1000  . memantine  28 mg Oral Daily  . metoprolol tartrate  12.5 mg Oral q morning - 10a  . sodium chloride  250 mL Intravenous Once  . sodium chloride  3 mL Intravenous Q12H      LABS: Basic Metabolic Panel:  Recent Labs  01/09/15 1422 01/10/15 1235 01/10/15 1844  NA 136 134*  --   K 4.4 4.5  --   CL 97* 96*  --   CO2 29 31  --   GLUCOSE 181* 184*  --   BUN 12 11  --   CREATININE 0.80 0.91  --   CALCIUM 9.0 8.8*  --   MG  --   --  2.1   Liver Function Tests:  Recent Labs  01/10/15 1235  AST 25  ALT 20  ALKPHOS 170*  BILITOT 0.5  PROT 6.9  ALBUMIN 3.0*   No results for input(s): LIPASE, AMYLASE in the last 72 hours. CBC:  Recent Labs  01/09/15 1422 01/10/15 1235  WBC 7.0 7.9  NEUTROABS 5.4 6.8  HGB 10.8* 11.5*  HCT 33.7* 36.8*  MCV 81.2 83.6  PLT 281 238   Cardiac Enzymes: No results for input(s): CKTOTAL, CKMB, CKMBINDEX,  TROPONINI in the last 72 hours. BNP: No results for input(s): PROBNP in the last 72 hours. D-Dimer: No results for input(s): DDIMER in the last 72 hours. Hemoglobin A1C: No results for input(s): HGBA1C in the last 72 hours. Fasting Lipid Panel: No results for input(s): CHOL, HDL, LDLCALC, TRIG, CHOLHDL, LDLDIRECT in the last 72 hours. Thyroid Function Tests: No results for input(s): TSH, T4TOTAL, T3FREE, THYROIDAB in the last 72 hours.  Invalid input(s): FREET3   Radiology/Studies:  Dg Chest 2 View  01/11/2015  CLINICAL DATA:  Acute respiratory failure. EXAM: CHEST  2 VIEW COMPARISON:  01/10/2015 FINDINGS: Marked cardiomegaly is unchanged from yesterday's study but significantly increased from 07/16/2014. Thoracic aortic calcification is noted. Retrocardiac opacity in the left lower lobe is unchanged with hazy density on the lateral projection. There is at most a small left pleural effusion. There is minimal right basilar atelectasis. No  overt pulmonary edema. No acute osseous abnormality identified. IMPRESSION: 1. Marked cardiomegaly with a component of pericardial effusion suspected. 2. Unchanged left lower lobe opacity favored to represent atelectasis although pneumonia is not excluded. 3. Likely small left pleural effusion. Electronically Signed   By: Logan Bores M.D.   On: 01/11/2015 09:26   Dg Chest Portable 1 View  01/10/2015  CLINICAL DATA:  Altered mental status and shortness of breath. EXAM: PORTABLE CHEST 1 VIEW COMPARISON:  07/16/2014 and 02/28/2014 FINDINGS: Lungs are adequately inflated with opacification over the left base/ retrocardiac region which may be due to effusion with atelectasis versus infection. Right lung is clear. There is marked cardiomegaly slightly worse as cannot exclude a component of pericardial fluid. Calcified plaque present over the aortic arch. Degenerative changes of the spine. IMPRESSION: Opacification over the left base/ retrocardiac region which may be  due to effusion/atelectasis although cannot exclude infection. PA and lateral chest x-ray would be better for evaluation of the left base as recommend follow-up to resolution. Marked cardiomegaly slightly worse as cannot exclude a component of pericardial fluid. Electronically Signed   By: Marin Olp M.D.   On: 01/10/2015 12:48    PHYSICAL EXAM General: Well developed, elderly, in no acute distress. Head: Normocephalic, atraumatic, sclera non-icteric, oropharynx is clear Neck: Negative for carotid bruits. JVD is elevated. No adenopathy Lungs: reduced BS in bases without wheezes, rales, or rhonchi. Breathing is unlabored. Heart: IRRR S1 S2 without murmurs, rubs, or gallops.  Abdomen: Soft, non-tender, non-distended with normoactive bowel sounds. Liver palpable. No rebound/guarding. No obvious abdominal masses. Extremities: No clubbing, cyanosis or edema.  Distal pedal pulses are 2+ and equal bilaterally. Neuro: Alert and oriented X 3. Moves all extremities spontaneously. Psych:  Responds to questions appropriately with a normal affect.  ASSESSMENT AND PLAN: 1. Pericardial effusion. I reviewed Echo findings. Patient has a very large pericardial effusion measuring 2.5-4 cm circumferential. There are no clear findings of tamponade. RA is enlarged and stiff/immobile. There are some respirophasic changes by doppler but not clearly diagnostic with afib. I think his effusion is the cause of his respiratory distress. HR is elevated but BP is stable. CXR shows marked cardiac enlargement. Ecg shows low voltage more so than before. Ultimately would benefit from pericardial drainage. Discussed with son Gerald Stabs. Discussed options of watchful waiting, pericardiocentesis, or pericardial window. I would favor pericardiocentesis. Discussed the procedure including risks of bleeding, hepatic or pulmonary injury, arrhythmia, infection. Gerald Stabs is going to discuss with his mother. Will need consent prior from family since  patient has dementia. Will check coags. Gently hydrate. Hold diuretics. Will discuss with interventionalist- Dr. Fletcher Anon concerning timing of procedure.   2. Chronic Afib rate increased secondary to pericardial effusion  3. History of dementia.  I doubt active infection at this point. I think all his symptoms are related to his pericardial effusion.   Present on Admission:  . CAP (community acquired pneumonia) . Atrial fibrillation ([place]) . Prolonged Q-T interval on ECG . Dementia . Acute respiratory failure ([place]) . Chronic anemia . Acute encephalopathy  Signed, Peter Martinique, [place] 01/12/2015 9:58 AM   I discussed with Dr. Fletcher Anon. Patient is now in ICU. HR has slowed and BP stable. He is comfortable. Plan elective pericardiocentesis on Monday.  Peter Martinique MD, [facility]

## 2015-01-12 NOTE — Progress Notes (Signed)
PT Cancellation Note  Patient Details Name: [name] MRN: [id number] DOB: 20-Nov-1927   Cancelled Treatment:    Reason Eval/Treat Not Completed: Medical issues which prohibited therapy, pt transferred to stepdown unit.  Will reassess status next date and proceed with mobility eval if appropriate.  Thank you,    Herbie Drape 01/12/2015, 11:04 AM

## 2015-01-13 ENCOUNTER — Encounter (HOSPITAL_COMMUNITY): Payer: Self-pay | Admitting: Cardiology

## 2015-01-13 ENCOUNTER — Encounter (HOSPITAL_COMMUNITY)
Admission: EM | Disposition: A | Discharge status: Home Health | Payer: Self-pay | Source: Home / Self Care | Attending: Internal Medicine

## 2015-01-13 ENCOUNTER — Ambulatory Visit (HOSPITAL_COMMUNITY): Payer: Medicare Other

## 2015-01-13 DIAGNOSIS — I313 Pericardial effusion (noninflammatory): Secondary | ICD-10-CM | POA: Insufficient documentation

## 2015-01-13 DIAGNOSIS — I3139 Other pericardial effusion (noninflammatory): Secondary | ICD-10-CM | POA: Insufficient documentation

## 2015-01-13 HISTORY — PX: CARDIAC CATHETERIZATION: SHX172

## 2015-01-13 LAB — GRAM STAIN

## 2015-01-13 LAB — BASIC METABOLIC PANEL
ANION GAP: 7 (ref 5–15)
BUN: 16 mg/dL (ref 6–20)
CHLORIDE: 103 mmol/L (ref 101–111)
CO2: 31 mmol/L (ref 22–32)
CREATININE: 0.68 mg/dL (ref 0.61–1.24)
Calcium: 8.8 mg/dL — ABNORMAL LOW (ref 8.9–10.3)
GFR calc non Af Amer: >60 mL/min (ref >60)
Glucose, Bld: 128 mg/dL — ABNORMAL HIGH (ref 65–99)
Potassium: 4.3 mmol/L (ref 3.5–5.1)
SODIUM: 141 mmol/L (ref 135–145)

## 2015-01-13 LAB — GLUCOSE, SEROUS FLUID: Glucose, Fluid: 100 mg/dL

## 2015-01-13 LAB — PROTEIN, BODY FLUID: Total protein, fluid: 4.4 g/dL

## 2015-01-13 LAB — LACTATE DEHYDROGENASE, PLEURAL OR PERITONEAL FLUID: LD FL: 627 U/L — AB (ref 3–23)

## 2015-01-13 LAB — OCCULT BLOOD, POC DEVICE: FECAL OCCULT BLD: NEGATIVE

## 2015-01-13 LAB — BODY FLUID CELL COUNT WITH DIFFERENTIAL
Lymphs, Fluid: 5 %
MONOCYTE-MACROPHAGE-SEROUS FLUID: 2 % — AB (ref 50–90)
Neutrophil Count, Fluid: 93 % — ABNORMAL HIGH (ref 0–25)
Total Nucleated Cell Count, Fluid: 2326 cu mm — ABNORMAL HIGH (ref 0–1000)

## 2015-01-13 SURGERY — PERICARDIOCENTESIS
Anesthesia: LOCAL

## 2015-01-13 MED ORDER — LIDOCAINE HCL (PF) 1 % IJ SOLN
INTRAMUSCULAR | Status: DC | PRN
  Administered 2015-01-13: 20 mL

## 2015-01-13 MED ORDER — SODIUM CHLORIDE 0.9 % IV SOLN
250.0000 mL | INTRAVENOUS | Status: DC | PRN
Start: 2015-01-13 — End: 2015-01-20

## 2015-01-13 MED ORDER — HEPARIN (PORCINE) IN NACL 2-0.9 UNIT/ML-% IJ SOLN
INTRAMUSCULAR | Status: DC | PRN
  Administered 2015-01-13: 11:00:00

## 2015-01-13 MED ORDER — SODIUM CHLORIDE 0.9 % IJ SOLN
3.0000 mL | Freq: Two times a day (BID) | INTRAMUSCULAR | Status: DC
  Administered 2015-01-13: 3 mL via INTRAVENOUS

## 2015-01-13 MED ORDER — SODIUM CHLORIDE 0.9 % IJ SOLN: 3.0000 mL | INTRAMUSCULAR | Status: DC | PRN

## 2015-01-13 MED ORDER — SODIUM CHLORIDE 0.9 % IV SOLN
250.0000 mL | INTRAVENOUS | Status: DC | PRN
Start: 2015-01-13 — End: 2015-01-13

## 2015-01-13 MED ORDER — SODIUM CHLORIDE 0.9 % IJ SOLN: 3.0000 mL | Freq: Two times a day (BID) | INTRAMUSCULAR | Status: DC

## 2015-01-13 MED ORDER — HEPARIN (PORCINE) IN NACL 2-0.9 UNIT/ML-% IJ SOLN
INTRAMUSCULAR | Status: AC
  Filled 2015-01-13: qty 500

## 2015-01-13 MED ORDER — SODIUM CHLORIDE 0.9 % IJ SOLN
3.0000 mL | Freq: Two times a day (BID) | INTRAMUSCULAR | Status: DC
  Administered 2015-01-15 – 2015-01-20 (×11): 3 mL via INTRAVENOUS

## 2015-01-13 MED ORDER — LIDOCAINE HCL (PF) 1 % IJ SOLN
INTRAMUSCULAR | Status: AC
  Filled 2015-01-13: qty 30

## 2015-01-13 MED ORDER — SODIUM CHLORIDE 0.9 % IV SOLN: 250.0000 mL | INTRAVENOUS | Status: DC | PRN

## 2015-01-13 SURGICAL SUPPLY — 5 items
CATH ANGIO 5F PIGTAIL 65CM (CATHETERS) ×2 IMPLANT
PACK CARDIAC CATHETERIZATION (CUSTOM PROCEDURE TRAY) ×2 IMPLANT
PERIVAC PERICARDIOCENTESIS 8.3 (TRAY / TRAY PROCEDURE) ×2 IMPLANT
TRANSDUCER W/MONITORING KIT (MISCELLANEOUS) IMPLANT
WIRE EMERALD 3MM-J .035X150CM (WIRE) ×2 IMPLANT

## 2015-01-13 NOTE — Brief Op Note (Addendum)
   BRIEF PERICARDIOCENTESIS NOTE  NAME:  OLLIVER [name]   MRN: [id number] DOB:  [birth year]   ADMIT DATE: 01/10/2015   12:01 PM  PCP: [name], NP PRIMARY CARDIOLOGIST: [name] REFERRING CARDIOLOGISTS: Drs. Martinique & Ross.   PATIENT:  [name]  81 y.o. male with PMH of Chronic Atrial Fibrillation - not on Anticoagulation due to GI Bleeds.  He has Moderate MR & TR with Moderate Pulmonary HTN at baseline.  He was admitted 01/10/15 by [facility] for Sepsis & Acute Respiratory Failure - suspected PNA.  CXR suggested cardiomegaly.  Echocardiographic evaluation revealed a Very Large Circumferential Pericardial Effusion (2.5-4 cm) without evidence of tamponade physiology.  He is referred for Pericardiocentesis.  PERFORMING PHYSICIAN: HARDING, DAVID W, MD, MS  PROCEDURE PERFORMED: Pericardiocentesis   PRE-OPERATIVE DIAGNOSIS:  Large pericardial efffusion  POST-OPERATIVE DIAGNOSIS:  Successful Pericardiocentesis via SubXiphoid Access using Echocardiographic guidance.  1250 mL of bloody / serosanguinous fluid was removed  Pigtail drain catheter left in Place with closed-wound suction bag attached.  MEDICATIONS:   ~25 mL Subcutaneous & subdermal Lidocaine  No Sedation due to baseline dementia  PROCEDURE: The patient was placed on the table in a 45 angle with the subxiphoid area prepped and draped in a sterile fashion. Preprocedural echocardiographic demonstration of a large circumferential pericardial effusion was identified.  A timeout procedure was performed.  The subxiphoid area was anesthetized using subcutaneous lidocaine with both the hypoglossal normal and long needle. The subxiphoid space was then entered using the provided pericardial centesis needle with trocar. The needle was advanced into the subchondral space and then the angled advanced reduced with the needle then advanced toward the left shoulder. Additional lidocaine was administered with the trocar  temporally removed. The trocar was replaced and the needle advanced under ultrasound guidance into the pericardium. The trocar was removed and bloody/serosanguineous fluid was aspirated. Next a stopcock with two 10 mL syringes was attached to administer as a saline. Injection through the needle confirmed placement in the pericardium by echocardiogram. A small skin incision was made and the needle exchanged for a short pigtail catheter using Seldinger technique with guidewire. Guidewire advancement through the needle was confirmed in to be in the pericardium both tachycardic graft leak and fluoroscopically. Supple passes with the dilator trocar were performed before advancing the drainage pigtail catheter.  Once in place, a stopcock was then reattached to the catheter. 4 x 60 mL syringes of bloody/sanguinous fluid was aspirated to send for laboratory studies. Next the drainage bag was attached to the stopcock and an additional 1 L of fluid was drained into the drainage bag which was also sent for laboratory studies.  A total of 1250 mL of bloody serous sinus fluid was aspirated. The effusion was reduced from very large to minimal by post aspiration echocardiogram evaluation. The drain was then sutured in place and sterile dressing applied.  No complications. Minimal blood loss. Less than 10 mL  The patient was stable before, during and after the procedure. Rest or status wasimproved post seizure.  Recommendations:  Return to ICU for standard monitoring post pericardial centesis   Reassess effusion by echocardiogram in the morning to determine if the drain can be removed.   Follow-up laboratory studies from the [facility] effusion fluid.   Full note to follow  Leonie Man, M.D., M.S. Interventional Cardiologist   Pager # [phone]  01/13/2015 12:17 PM

## 2015-01-13 NOTE — Progress Notes (Signed)
PT Cancellation Note  Patient Details Name: [name] MRN: [id number] DOB: 04-29-1927   Cancelled Treatment:    Reason Eval/Treat Not Completed: Patient not medically ready;Patient at procedure or test/unavailable.  Pt had gone to CATH, then will be on bedrest for much of the day.  Will see if after bedrest status if able or try 10/25. 01/13/2015  Donnella Sham, [place] [phone]  (pager)   Priscille Shadduck, Tessie Fass 01/13/2015, 11:11 AM

## 2015-01-13 NOTE — Interval H&P Note (Signed)
History and Physical Interval Note:  01/13/2015 10:59 AM  [name]  has presented today for surgery, with the diagnosis of pericardial efffusion.   The various methods of treatment have been discussed with the patient and family. After consideration of risks, benefits and other options for treatment, the patient has consented to  Procedure(s): Pericardiocentesis (N/A) as a surgical intervention .     The patient's history has been reviewed, patient examined, no change in status, stable for surgery.  I have reviewed the patient's chart and labs.  Questions were answered to the patient's satisfaction.     Clell Trahan W

## 2015-01-13 NOTE — Progress Notes (Signed)
[facility] PROGRESS NOTE    Progress Note   [name] WNU:[id number] DOB: 08/12/1927 DOA: 01/10/2015 PCP: [name], NP   Brief Narrative:   [name] is an 81 y.o. male male with past medical history of chronic atrial fibrillation not on anticoagulation secondary to GI bleed brought to the hospital because of increase shortness of breath and confusion.   Assessment/Plan:  Acute respiratory failure with hypoxia likely due to large pericardial effusion: Afebrile, continue empirically on IV Rocephin and doxycycline for possible community-acquired pneumonia versus aspiration pneumonia.  2-D echo showed large pericardial effusion Patient still requiring high flow oxygen   Pericardial effusion without tamponade Patient status post pericardiocentesis with pericardial window, 10/24 Follow-up laboratory studies from the pericardial fluid  Sepsis: Unclear etiology of source, may be respiratory versus urinary. Started empirically on IV Rocephin awaiting urine cultures.  Chronic  Atrial fibrillation [facility]): Now with A. fib with RVR,  Continue metoprolol IV for heart rate greater than 110.  Chronic systolic heart failure: Currently compensated. Continue Lasix  And beta blocker. 2-D echo is pending.  Acute encephalopathy with underlying diagnosis of dementia: On Namenda and Aricept likely due to infectious etiology.  Prolonged QT on EKG: Magnesium and potassium are within normal. QTC mildly improved from 580 -564.  Atrial fibrillation with RVR (HCC) Rate controlled for the most part some RVR likely from effusion. No med changes. BP controlled. Not on anticoagulation due to hx GIB Chronic Afib rate increased secondary to pericardial effusion   DVT Prophylaxis - Lovenox ordered.  Family Communication: Spoke with Lestine Mount who is at bedside. He understands plan of care.  Disposition Plan: Home when stable. Code Status:     Code Status Orders        Start     Ordered   01/10/15 1716  Full code   Continuous     01/10/15 1715        IV Access:    Peripheral IV   Procedures and diagnostic studies:   No results found.   Medical Consultants:    None.  Anti-Infectives:   Anti-infectives    Start     Dose/Rate Route Frequency Ordered Stop   01/10/15 2200  doxycycline (VIBRA-TABS) tablet 100 mg     100 mg Oral Every 12 hours 01/10/15 1715 01/17/15 2159   01/10/15 1730  cefTRIAXone (ROCEPHIN) 1 g in dextrose 5 % 50 mL IVPB     1 g 100 mL/hr over 30 Minutes Intravenous Every 24 hours 01/10/15 1715 01/17/15 1729   01/10/15 1415  levofloxacin (LEVAQUIN) IVPB 750 mg     750 mg 100 mL/hr over 90 Minutes Intravenous  Once 01/10/15 1402 01/10/15 1604      Subjective:    [name] Reviewed telemetry pt in atrial fibrillation with RVR. Rate 115-140:  Objective:    Filed Vitals:   01/13/15 1155 01/13/15 1223 01/13/15 1234 01/13/15 1300  BP: 138/78  131/53 113/51  Pulse: 110  106 75  Temp:  98.5 F (36.9 C)    TempSrc:  Oral    Resp: 0   19  Height:      Weight:      SpO2: 94%   82%    Intake/Output Summary (Last 24 hours) at 01/13/15 1357 Last data filed at 01/13/15 1300  Gross per 24 hour  Intake 1642.5 ml  Output   1175 ml  Net  467.5 ml   Filed Weights   01/10/15 1657  Weight:  98.2 kg (216 lb 7.9 oz)    Exam: Gen:  NAD Cardiovascular:  Irregular rate and rhythm Chest and lungs:   Good air movement with crackles on the right. No wheezing rails. Abdomen:  Abdomen soft, NT/ND, + BS Extremities:  No C/E/C   Data Reviewed:    Labs: Basic Metabolic Panel:  Recent Labs Lab 01/09/15 1422 01/10/15 1235 01/10/15 1844 01/12/15 1035 01/13/15 0239  NA 136 134*  --  138 141  K 4.4 4.5  --  4.2 4.3  CL 97* 96*  --  98* 103  CO2 29 31  --  31 31  GLUCOSE 181* 184*  --  142* 128*  BUN 12 11  --  15 16  CREATININE 0.80 0.91  --  0.90 0.68  CALCIUM 9.0 8.8*  --  8.7* 8.8*  MG  --   --  2.1   --   --    GFR Estimated Creatinine Clearance: 81.9 mL/min (by C-G formula based on Cr of 0.68). Liver Function Tests:  Recent Labs Lab 01/10/15 1235  AST 25  ALT 20  ALKPHOS 170*  BILITOT 0.5  PROT 6.9  ALBUMIN 3.0*   No results for input(s): LIPASE, AMYLASE in the last 168 hours.  Recent Labs Lab 01/10/15 1714  AMMONIA 10   Coagulation profile  Recent Labs Lab 01/12/15 1035  INR 1.46    CBC:  Recent Labs Lab 01/09/15 1422 01/10/15 1235 01/12/15 1035  WBC 7.0 7.9 7.5  NEUTROABS 5.4 6.8  --   HGB 10.8* 11.5* 10.8*  HCT 33.7* 36.8* 35.6*  MCV 81.2 83.6 83.2  PLT 281 238 247   Cardiac Enzymes: No results for input(s): CKTOTAL, CKMB, CKMBINDEX, TROPONINI in the last 168 hours. BNP (last 3 results) No results for input(s): PROBNP in the last 8760 hours. CBG:  Recent Labs Lab 01/12/15 0819  GLUCAP 131*   D-Dimer: No results for input(s): DDIMER in the last 72 hours. Hgb A1c: No results for input(s): HGBA1C in the last 72 hours. Lipid Profile: No results for input(s): CHOL, HDL, LDLCALC, TRIG, CHOLHDL, LDLDIRECT in the last 72 hours. Thyroid function studies: No results for input(s): TSH, T4TOTAL, T3FREE, THYROIDAB in the last 72 hours.  Invalid input(s): FREET3 Anemia work up: No results for input(s): VITAMINB12, FOLATE, FERRITIN, TIBC, IRON, RETICCTPCT in the last 72 hours. Sepsis Labs:  Recent Labs Lab 01/09/15 1422 01/10/15 1234 01/10/15 1235 01/12/15 1035  WBC 7.0  --  7.9 7.5  LATICACIDVEN  --  1.22  --   --    Microbiology Recent Results (from the past 240 hour(s))  Culture, Urine     Status: None   Collection Time: 01/10/15  2:24 PM  Result Value Ref Range Status   Specimen Description URINE, RANDOM  Final   Special Requests NONE  Final   Culture MULTIPLE SPECIES PRESENT, SUGGEST RECOLLECTION  Final   Report Status 01/12/2015 FINAL  Final  Culture, body fluid-bottle     Status: None (Preliminary result)   Collection Time:  01/13/15 11:44 AM  Result Value Ref Range Status   Specimen Description FLUID PERITONEAL  Final   Special Requests BAA 10MLS  Final   Culture PENDING  Incomplete   Report Status PENDING  Incomplete     Medications:   . aspirin  81 mg Oral Daily  . cefTRIAXone (ROCEPHIN)  IV  1 g Intravenous Q24H  . darifenacin  7.5 mg Oral Daily  . donepezil  10 mg Oral QHS  .  doxycycline  100 mg Oral Q12H  . gabapentin  100 mg Oral QHS  . hydrOXYzine  5 mg Oral BID  . memantine  28 mg Oral Daily  . metoprolol tartrate  12.5 mg Oral q morning - 10a  . sodium chloride  250 mL Intravenous Once  . sodium chloride  3 mL Intravenous Q12H  . sodium chloride  3 mL Intravenous Q12H  . sodium chloride  3 mL Intravenous Q12H   Continuous Infusions: . sodium chloride 75 mL/hr at 01/13/15 0800    Time spent: 25 min   LOS: 3 days   [facility]  [facility] Pager [phone]  *Please refer to [email or web address], password [email or web address] to get updated schedule on who will round on this patient, as hospitalists switch teams weekly. If 7PM-7AM, please contact night-coverage at [email or web address], password [email or web address] for any overnight needs.  01/13/2015, 1:57 PM

## 2015-01-13 NOTE — Evaluation (Signed)
Physical Therapy Evaluation Patient Details Name: [name] MRN: [id number] DOB: 1927-11-14 Today's Date: 01/13/2015   History of Present Illness  pt is an 81 y/o male with PMHx of chronic afib, dm, lumbar disc ds and dementia admitted to ED with increased SOB and resp rate.  pt found to have sepsis from resp vs urinary source and resp failure likely due to large pericardial effusion, s/p pericardiocentesis 10/24.  Clinical Impression  Pt admitted with/for SOB and AMS.  Found to have sepsis and pericardial effusions  Pt currently limited functionally due to the problems listed below.  (see problems list.)  Pt will benefit from PT to maximize function and safety to be able to get home safely with available assist of family and PCA.     Follow Up Recommendations Home health PT;Supervision/Assistance - 24 hour    Equipment Recommendations  None recommended by PT    Recommendations for Other Services       Precautions / Restrictions Precautions Precautions: Fall      Mobility  Bed Mobility Overal bed mobility: Needs Assistance Bed Mobility: Supine to Sit;Sit to Supine     Supine to sit: Min assist Sit to supine: Min assist   General bed mobility comments: min truncal assist and cues for redirection  Transfers Overall transfer level: Needs assistance   Transfers: Sit to/from Stand Sit to Stand: Mod assist         General transfer comment: assist to come forward, pt with moderate list posteriorly  Ambulation/Gait Ambulation/Gait assistance: Mod assist Ambulation Distance (Feet): 1 Feet (forward and back) Assistive device: 4-wheeled walker       General Gait Details: Unable to assess gait due to pt aborted after 1 foot away from bed.  Stairs            Wheelchair Mobility    Modified Rankin (Stroke Patients Only)       Balance Overall balance assessment: Needs assistance Sitting-balance support: Single extremity supported;Bilateral upper extremity  supported Sitting balance-Leahy Scale: Poor Sitting balance - Comments: posterior list before balance challenge is given   Standing balance support: Bilateral upper extremity supported Standing balance-Leahy Scale: Poor Standing balance comment: reliant on the RW or assist                             Pertinent Vitals/Pain Pain Assessment: No/denies pain    Home Living Family/patient expects to be discharged to:: Private residence Living Arrangements: Spouse/significant other Available Help at Discharge: Family;Personal care attendant;Available 24 hours/day Type of Home: House Home Access: Level entry     Home Layout: One level Home Equipment: Walker - 2 wheels;Shower seat;Bedside commode;Walker - 4 wheels Additional Comments: wife with recent amputation    Prior Function Level of Independence: Needs assistance               Hand Dominance        Extremity/Trunk Assessment   Upper Extremity Assessment: Overall WFL for tasks assessed           Lower Extremity Assessment: Overall WFL for tasks assessed;Generalized weakness         Communication   Communication: No difficulties  Cognition Arousal/Alertness: Awake/alert Behavior During Therapy: Restless;WFL for tasks assessed/performed Overall Cognitive Status: History of cognitive impairments - at baseline                      General Comments General comments (skin integrity, edema,  etc.): On 2 L [place] at rest sats at 95% and HR in the 100's, With mobility EHR labile at 100's to 140's afib and sats on RA slowly fall to upper 80's    Exercises        Assessment/Plan    PT Assessment Patient needs continued PT services  PT Diagnosis Difficulty walking;Generalized weakness   PT Problem List Decreased strength;Decreased activity tolerance;Decreased balance;Decreased mobility;Decreased knowledge of use of DME;Cardiopulmonary status limiting activity  PT Treatment Interventions DME  instruction;Gait training;Functional mobility training;Therapeutic activities;Balance training;Patient/family education   PT Goals (Current goals can be found in the Care Plan section) Acute Rehab PT Goals Patient Stated Goal: get home PT Goal Formulation: With patient Time For Goal Achievement: 01/20/15 Potential to Achieve Goals: Good    Frequency Min 3X/week   Barriers to discharge        Co-evaluation               End of Session Equipment Utilized During Treatment: Oxygen Activity Tolerance: Patient tolerated treatment well Patient left: in bed;with call bell/phone within reach;with family/visitor present Nurse Communication: Mobility status         Time: 9811-9147 PT Time Calculation (min) (ACUTE ONLY): 27 min   Charges:   PT Evaluation $Initial PT Evaluation Tier I: 1 Procedure PT Treatments $Therapeutic Activity: 8-22 mins   PT G Codes:        Nadelyn Enriques, Tessie Fass 01/13/2015, 5:16 PM 01/13/2015  Donnella Sham, [place] [phone]  (pager)

## 2015-01-13 NOTE — Progress Notes (Signed)
Echocardiogram 2D Echocardiogram limited for pericardiocentesis has been performed.  [name] 01/13/2015, 12:09 PM

## 2015-01-13 NOTE — Progress Notes (Signed)
    Subjective: No complaints  Objective: Vital signs in last 24 hours: Temp:  [97.6 F (36.4 C)-98.7 F (37.1 C)] 98.4 F (36.9 C) (10/24 0402) Pulse Rate:  [43-118] 89 (10/24 0700) Resp:  [15-24] 23 (10/24 0700) BP: (102-143)/(54-84) 125/60 mmHg (10/24 0630) SpO2:  [89 %-100 %] 99 % (10/24 0700) Last BM Date: 01/09/15  Intake/Output from previous day: 10/23 0701 - 10/24 0700 In: 1572.5 [I.V.:1022.5; IV Piggyback:550] Out: 1025 [Urine:1025] Intake/Output this shift:    Medications Scheduled Meds: . aspirin  81 mg Oral Daily  . cefTRIAXone (ROCEPHIN)  IV  1 g Intravenous Q24H  . darifenacin  7.5 mg Oral Daily  . donepezil  10 mg Oral QHS  . doxycycline  100 mg Oral Q12H  . gabapentin  100 mg Oral QHS  . hydrOXYzine  5 mg Oral BID  . memantine  28 mg Oral Daily  . metoprolol tartrate  12.5 mg Oral q morning - 10a  . sodium chloride  250 mL Intravenous Once  . sodium chloride  3 mL Intravenous Q12H  . sodium chloride  3 mL Intravenous Q12H   Continuous Infusions: . sodium chloride 75 mL/hr at 01/13/15 0500   PRN Meds:.sodium chloride, sodium chloride, metoprolol, nitroGLYCERIN, RESOURCE THICKENUP CLEAR, sodium chloride, sodium chloride  PE: General appearance: alert, cooperative and no distress Lungs: clear to auscultation bilaterally Heart: irregularly irregular rhythm Extremities: No LEE Pulses: 2+ and symmetric Skin: Warm and dry Neurologic: Grossly normal  Lab Results:   Recent Labs  01/10/15 1235 01/12/15 1035  WBC 7.9 7.5  HGB 11.5* 10.8*  HCT 36.8* 35.6*  PLT 238 247   BMET  Recent Labs  01/10/15 1235 01/12/15 1035 01/13/15 0239  NA 134* 138 141  K 4.5 4.2 4.3  CL 96* 98* 103  CO2 31 31 31   GLUCOSE 184* 142* 128*  BUN 11 15 16   CREATININE 0.91 0.90 0.68  CALCIUM 8.8* 8.7* 8.8*   PT/INR  Recent Labs  01/12/15 1035  LABPROT 17.8*  INR 1.46    Assessment/Plan     Pericardial effusion without cardiac  tamponade Pericardiocentesis planned ~1030hrs.          Chronic, Atrial fibrillation (HCC)   Dementia   CAP (community acquired pneumonia)   Prolonged Q-T interval on ECG   Acute respiratory failure (HCC)   Chronic anemia   Chronic systolic (congestive) heart failure (HCC)   Acute encephalopathy   Atrial fibrillation with RVR (HCC) Rate controlled for the most part some RVR likely from effusion. No med changes.   BP controlled.  Not on anticoagulation due to hx GIB.      LOS: 3 days    HAGER, BRYAN PA-C 01/13/2015 9:30 AM  Pt seen by PA.  Currently undergoing pericardocentesis WIll follow.    Dorris Carnes

## 2015-01-13 NOTE — H&P (View-Only) (Signed)
TELEMETRY: Reviewed telemetry pt in atrial fibrillation with RVR. Rate 115-140: Filed Vitals:   01/11/15 2234 01/11/15 2235 01/12/15 0634 01/12/15 0823  BP: 124/76  124/58 119/82  Pulse: 83 105 120 106  Temp: 99.3 F (37.4 C)  99.6 F (37.6 C) 98.2 F (36.8 C)  TempSrc: Oral  Oral Oral  Resp: 18  20 30   Height:      Weight:      SpO2: 89% 90% 94% 90%    Intake/Output Summary (Last 24 hours) at 01/12/15 0958 Last data filed at 01/12/15 0941  Gross per 24 hour  Intake     50 ml  Output     50 ml  Net      0 ml   Filed Weights   01/10/15 1657  Weight: 98.2 kg (216 lb 7.9 oz)    Subjective Patient feels OK. Denies any dyspnea and appears fairly comfortable. Feels urge to urinate with foley in place.   [name] aspirin  81 mg Oral Daily  . cefTRIAXone (ROCEPHIN)  IV  1 g Intravenous Q24H  . darifenacin  7.5 mg Oral Daily  . donepezil  10 mg Oral QHS  . doxycycline  100 mg Oral Q12H  . gabapentin  100 mg Oral QHS  . hydrOXYzine  5 mg Oral BID  . Influenza vac split quadrivalent PF  0.5 mL Intramuscular Tomorrow-1000  . memantine  28 mg Oral Daily  . metoprolol tartrate  12.5 mg Oral q morning - 10a  . sodium chloride  250 mL Intravenous Once  . sodium chloride  3 mL Intravenous Q12H      LABS: Basic Metabolic Panel:  Recent Labs  01/09/15 1422 01/10/15 1235 01/10/15 1844  NA 136 134*  --   K 4.4 4.5  --   CL 97* 96*  --   CO2 29 31  --   GLUCOSE 181* 184*  --   BUN 12 11  --   CREATININE 0.80 0.91  --   CALCIUM 9.0 8.8*  --   MG  --   --  2.1   Liver Function Tests:  Recent Labs  01/10/15 1235  AST 25  ALT 20  ALKPHOS 170*  BILITOT 0.5  PROT 6.9  ALBUMIN 3.0*   No results for input(s): LIPASE, AMYLASE in the last 72 hours. CBC:  Recent Labs  01/09/15 1422 01/10/15 1235  WBC 7.0 7.9  NEUTROABS 5.4 6.8  HGB 10.8* 11.5*  HCT 33.7* 36.8*  MCV 81.2 83.6  PLT 281 238   Cardiac Enzymes: No results for input(s): CKTOTAL, CKMB, CKMBINDEX,  TROPONINI in the last 72 hours. BNP: No results for input(s): PROBNP in the last 72 hours. D-Dimer: No results for input(s): DDIMER in the last 72 hours. Hemoglobin A1C: No results for input(s): HGBA1C in the last 72 hours. Fasting Lipid Panel: No results for input(s): CHOL, HDL, LDLCALC, TRIG, CHOLHDL, LDLDIRECT in the last 72 hours. Thyroid Function Tests: No results for input(s): TSH, T4TOTAL, T3FREE, THYROIDAB in the last 72 hours.  Invalid input(s): FREET3   Radiology/Studies:  Dg Chest 2 View  01/11/2015  CLINICAL DATA:  Acute respiratory failure. EXAM: CHEST  2 VIEW COMPARISON:  01/10/2015 FINDINGS: Marked cardiomegaly is unchanged from yesterday's study but significantly increased from 07/16/2014. Thoracic aortic calcification is noted. Retrocardiac opacity in the left lower lobe is unchanged with hazy density on the lateral projection. There is at most a small left pleural effusion. There is minimal right basilar atelectasis. No  overt pulmonary edema. No acute osseous abnormality identified. IMPRESSION: 1. Marked cardiomegaly with a component of pericardial effusion suspected. 2. Unchanged left lower lobe opacity favored to represent atelectasis although pneumonia is not excluded. 3. Likely small left pleural effusion. Electronically Signed   By: Logan Bores M.D.   On: 01/11/2015 09:26   Dg Chest Portable 1 View  01/10/2015  CLINICAL DATA:  Altered mental status and shortness of breath. EXAM: PORTABLE CHEST 1 VIEW COMPARISON:  07/16/2014 and 02/28/2014 FINDINGS: Lungs are adequately inflated with opacification over the left base/ retrocardiac region which may be due to effusion with atelectasis versus infection. Right lung is clear. There is marked cardiomegaly slightly worse as cannot exclude a component of pericardial fluid. Calcified plaque present over the aortic arch. Degenerative changes of the spine. IMPRESSION: Opacification over the left base/ retrocardiac region which may be  due to effusion/atelectasis although cannot exclude infection. PA and lateral chest x-ray would be better for evaluation of the left base as recommend follow-up to resolution. Marked cardiomegaly slightly worse as cannot exclude a component of pericardial fluid. Electronically Signed   By: Marin Olp M.D.   On: 01/10/2015 12:48    PHYSICAL EXAM General: Well developed, elderly, in no acute distress. Head: Normocephalic, atraumatic, sclera non-icteric, oropharynx is clear Neck: Negative for carotid bruits. JVD is elevated. No adenopathy Lungs: reduced BS in bases without wheezes, rales, or rhonchi. Breathing is unlabored. Heart: IRRR S1 S2 without murmurs, rubs, or gallops.  Abdomen: Soft, non-tender, non-distended with normoactive bowel sounds. Liver palpable. No rebound/guarding. No obvious abdominal masses. Extremities: No clubbing, cyanosis or edema.  Distal pedal pulses are 2+ and equal bilaterally. Neuro: Alert and oriented X 3. Moves all extremities spontaneously. Psych:  Responds to questions appropriately with a normal affect.  ASSESSMENT AND PLAN: 1. Pericardial effusion. I reviewed Echo findings. Patient has a very large pericardial effusion measuring 2.5-4 cm circumferential. There are no clear findings of tamponade. RA is enlarged and stiff/immobile. There are some respirophasic changes by doppler but not clearly diagnostic with afib. I think his effusion is the cause of his respiratory distress. HR is elevated but BP is stable. CXR shows marked cardiac enlargement. Ecg shows low voltage more so than before. Ultimately would benefit from pericardial drainage. Discussed with son Gerald Stabs. Discussed options of watchful waiting, pericardiocentesis, or pericardial window. I would favor pericardiocentesis. Discussed the procedure including risks of bleeding, hepatic or pulmonary injury, arrhythmia, infection. Gerald Stabs is going to discuss with his mother. Will need consent prior from family since  patient has dementia. Will check coags. Gently hydrate. Hold diuretics. Will discuss with interventionalist- Dr. Fletcher Anon concerning timing of procedure.   2. Chronic Afib rate increased secondary to pericardial effusion  3. History of dementia.  I doubt active infection at this point. I think all his symptoms are related to his pericardial effusion.   Present on Admission:  . CAP (community acquired pneumonia) . Atrial fibrillation ([place]) . Prolonged Q-T interval on ECG . Dementia . Acute respiratory failure ([place]) . Chronic anemia . Acute encephalopathy  Signed, Montez Cuda [place], [place] 01/12/2015 9:58 AM   I discussed with Dr. Fletcher Anon. Patient is now in ICU. HR has slowed and BP stable. He is comfortable. Plan elective pericardiocentesis on Monday.  Yuliza Cara [place] MD, [facility]

## 2015-01-13 NOTE — Progress Notes (Signed)
SLP Cancellation Note  Patient Details Name: [name] MRN: [id number] DOB: 06/05/1927   Cancelled treatment:       Reason Eval/Treat Not Completed: Patient at procedure or test/unavailable. NPO for procedure. Nectar thick liquids when diet resumed. Will f/u for progress tomorrow.    Cherryl Babin, Katherene Ponto 01/13/2015, 8:09 AM

## 2015-01-14 ENCOUNTER — Encounter (HOSPITAL_COMMUNITY): Payer: Self-pay | Admitting: Radiology

## 2015-01-14 ENCOUNTER — Inpatient Hospital Stay (HOSPITAL_COMMUNITY): Payer: Medicare Other

## 2015-01-14 ENCOUNTER — Ambulatory Visit (HOSPITAL_COMMUNITY): Payer: Medicare Other

## 2015-01-14 DIAGNOSIS — I482 Chronic atrial fibrillation: Secondary | ICD-10-CM

## 2015-01-14 DIAGNOSIS — I313 Pericardial effusion (noninflammatory): Secondary | ICD-10-CM

## 2015-01-14 LAB — PH, BODY FLUID: pH, Body Fluid: 8

## 2015-01-14 LAB — BASIC METABOLIC PANEL
ANION GAP: 10 (ref 5–15)
BUN: 17 mg/dL (ref 6–20)
CALCIUM: 8.8 mg/dL — AB (ref 8.9–10.3)
CO2: 29 mmol/L (ref 22–32)
Chloride: 104 mmol/L (ref 101–111)
Creatinine, Ser: 0.68 mg/dL (ref 0.61–1.24)
Glucose, Bld: 149 mg/dL — ABNORMAL HIGH (ref 65–99)
POTASSIUM: 4.1 mmol/L (ref 3.5–5.1)
SODIUM: 143 mmol/L (ref 135–145)

## 2015-01-14 LAB — MISC LABCORP TEST (SEND OUT): Labcorp test code: 88062

## 2015-01-14 LAB — MRSA PCR SCREENING: MRSA BY PCR: NEGATIVE

## 2015-01-14 LAB — CREATININE, FLUID (PLEURAL, PERITONEAL, JP DRAINAGE): CREAT FL: 0.6 mg/dL

## 2015-01-14 LAB — PATHOLOGIST SMEAR REVIEW

## 2015-01-14 MED ORDER — METOPROLOL TARTRATE 25 MG PO TABS: 25.0000 mg | ORAL_TABLET | Freq: Every morning | ORAL | Status: DC

## 2015-01-14 MED ORDER — METOPROLOL TARTRATE 25 MG PO TABS
25.0000 mg | ORAL_TABLET | Freq: Four times a day (QID) | ORAL | Status: DC
  Administered 2015-01-14 – 2015-01-20 (×21): 25 mg via ORAL
  Filled 2015-01-14 (×22): qty 1

## 2015-01-14 MED ORDER — SODIUM CHLORIDE 0.9 % IV SOLN: 250.0000 mL | INTRAVENOUS | Status: DC | PRN

## 2015-01-14 MED ORDER — IOHEXOL 350 MG/ML SOLN
100.0000 mL | Freq: Once | INTRAVENOUS | Status: AC | PRN
  Administered 2015-01-14: 100 mL via INTRAVENOUS

## 2015-01-14 NOTE — Progress Notes (Signed)
[facility]  Patient Status: Active (receiving services up to time of hospitalization)  [facility] is providing the following services: PT and OT  If patient discharges after hours, please call [phone].   [name] 01/14/2015, 9:56 AM

## 2015-01-14 NOTE — Progress Notes (Addendum)
[facility] PROGRESS NOTE    Progress Note   BURK HOCTOR ZWC:[id number] DOB: 1928-01-10 DOA: 01/10/2015 PCP: [name], NP   Brief Narrative:   JASDEEP KEPNER is an 81 y.o. male male with past medical history of chronic atrial fibrillation not on anticoagulation secondary to GI bleed brought to the hospital because of increase shortness of breath and confusion.   Assessment/Plan:  Acute respiratory failure with hypoxia likely due to large pericardial effusion: Afebrile, continue empirically on IV Rocephin and doxycycline for possible community-acquired pneumonia versus aspiration pneumonia.  2-D echo showed large pericardial effusion Hypoxemia improving currently requiring 2 L   Pericardial effusion without tamponade Patient status post pericardiocentesis  , 10/24 Awaiting pathology Repeat echo today If no signif reaccumulation then d/c tube  Will obtain CT of chest to further evaluate given history of pulmonary nodule  Sepsis: Probable Cap Unclear etiology of source, may be respiratory versus urinary. Resolved Continue IV Rocephin,/doxycycline day #4  Chronic  Atrial fibrillation [facility]): Now with A. fib with RVR,  Continue metoprolol IV for heart rate greater than 110.  Chronic systolic heart failure: Currently compensated. Continue Lasix  And beta blocker. Repeat 2-D echo pending  Acute encephalopathy with underlying diagnosis of dementia: On Namenda and Aricept likely due to infectious etiology.  Prolonged QT on EKG: Magnesium and potassium are within normal. QTC mildly improved from 580 -564.  Atrial fibrillation with RVR (HCC) Rate controlled for the most part some RVR likely from effusion. Cardiology increase metoprolol to 25 QID .. BP controlled. Not on anticoagulation due to hx GIB Chronic Afib rate increased secondary to pericardial effusion, now improving  Dysphagia-speech therapy recommends   Dysphagia 3 (mechanical soft);Thin liquid  DVT  Prophylaxis - Lovenox ordered.  Family Communication:   Disposition Plan: Home when stable. Likely tomorrow  Code Status:     Code Status Orders        Start     Ordered   01/10/15 1716  Full code   Continuous     01/10/15 1715        IV Access:    Peripheral IV   Procedures and diagnostic studies:   No results found.   Medical Consultants:    None.  Anti-Infectives:   Anti-infectives    Start     Dose/Rate Route Frequency Ordered Stop   01/10/15 2200  doxycycline (VIBRA-TABS) tablet 100 mg     100 mg Oral Every 12 hours 01/10/15 1715 01/17/15 2159   01/10/15 1730  cefTRIAXone (ROCEPHIN) 1 g in dextrose 5 % 50 mL IVPB     1 g 100 mL/hr over 30 Minutes Intravenous Every 24 hours 01/10/15 1715 01/17/15 1729   01/10/15 1415  levofloxacin (LEVAQUIN) IVPB 750 mg     750 mg 100 mL/hr over 90 Minutes Intravenous  Once 01/10/15 1402 01/10/15 1604      Subjective:    [name] heart rate has improved, denies any chest pain or shortness of breath  Objective:    Filed Vitals:   01/14/15 0748 01/14/15 0800 01/14/15 1000 01/14/15 1228  BP: 146/68 152/49  120/49  Pulse: 101   82  Temp: 97.1 F (36.2 C)   97.7 F (36.5 C)  TempSrc: Oral   Oral  Resp: 22 17  27   Height:      Weight:      SpO2: 93%  94% 97%    Intake/Output Summary (Last 24 hours) at 01/14/15 1405 Last data filed at 01/14/15  1030  Gross per 24 hour  Intake    150 ml  Output   1010 ml  Net   -860 ml   Filed Weights   01/10/15 1657  Weight: 98.2 kg (216 lb 7.9 oz)    Exam: Gen:  NAD Cardiovascular:  Irregular rate and rhythm Chest and lungs:   Good air movement with crackles on the right. No wheezing rails. Abdomen:  Abdomen soft, NT/ND, + BS Extremities:  No C/E/C   Data Reviewed:    Labs: Basic Metabolic Panel:  Recent Labs Lab 01/09/15 1422 01/10/15 1235 01/10/15 1844 01/12/15 1035 01/13/15 0239 01/14/15 0235  NA 136 134*  --  138 141 143  K 4.4 4.5  --   4.2 4.3 4.1  CL 97* 96*  --  98* 103 104  CO2 29 31  --  31 31 29   GLUCOSE 181* 184*  --  142* 128* 149*  BUN 12 11  --  15 16 17   CREATININE 0.80 0.91  --  0.90 0.68 0.68  CALCIUM 9.0 8.8*  --  8.7* 8.8* 8.8*  MG  --   --  2.1  --   --   --    GFR Estimated Creatinine Clearance: 81.9 mL/min (by C-G formula based on Cr of 0.68). Liver Function Tests:  Recent Labs Lab 01/10/15 1235  AST 25  ALT 20  ALKPHOS 170*  BILITOT 0.5  PROT 6.9  ALBUMIN 3.0*   No results for input(s): LIPASE, AMYLASE in the last 168 hours.  Recent Labs Lab 01/10/15 1714  AMMONIA 10   Coagulation profile  Recent Labs Lab 01/12/15 1035  INR 1.46    CBC:  Recent Labs Lab 01/09/15 1422 01/10/15 1235 01/12/15 1035  WBC 7.0 7.9 7.5  NEUTROABS 5.4 6.8  --   HGB 10.8* 11.5* 10.8*  HCT 33.7* 36.8* 35.6*  MCV 81.2 83.6 83.2  PLT 281 238 247   Cardiac Enzymes: No results for input(s): CKTOTAL, CKMB, CKMBINDEX, TROPONINI in the last 168 hours. BNP (last 3 results) No results for input(s): PROBNP in the last 8760 hours. CBG:  Recent Labs Lab 01/12/15 0819  GLUCAP 131*   D-Dimer: No results for input(s): DDIMER in the last 72 hours. Hgb A1c: No results for input(s): HGBA1C in the last 72 hours. Lipid Profile: No results for input(s): CHOL, HDL, LDLCALC, TRIG, CHOLHDL, LDLDIRECT in the last 72 hours. Thyroid function studies: No results for input(s): TSH, T4TOTAL, T3FREE, THYROIDAB in the last 72 hours.  Invalid input(s): FREET3 Anemia work up: No results for input(s): VITAMINB12, FOLATE, FERRITIN, TIBC, IRON, RETICCTPCT in the last 72 hours. Sepsis Labs:  Recent Labs Lab 01/09/15 1422 01/10/15 1234 01/10/15 1235 01/12/15 1035  WBC 7.0  --  7.9 7.5  LATICACIDVEN  --  1.22  --   --    Microbiology Recent Results (from the past 240 hour(s))  Culture, Urine     Status: None   Collection Time: 01/10/15  2:24 PM  Result Value Ref Range Status   Specimen Description URINE,  RANDOM  Final   Special Requests NONE  Final   Culture MULTIPLE SPECIES PRESENT, SUGGEST RECOLLECTION  Final   Report Status 01/12/2015 FINAL  Final  Culture, body fluid-bottle     Status: None (Preliminary result)   Collection Time: 01/13/15 11:44 AM  Result Value Ref Range Status   Specimen Description FLUID PERITONEAL  Final   Special Requests BAA 10MLS  Final   Culture PENDING  Incomplete  Report Status PENDING  Incomplete  Gram stain     Status: None   Collection Time: 01/13/15 11:44 AM  Result Value Ref Range Status   Specimen Description FLUID PERITONEAL  Final   Special Requests NONE  Final   Gram Stain   Final    MODERATE WBC PRESENT,BOTH PMN AND MONONUCLEAR NO ORGANISMS SEEN    Report Status 01/13/2015 FINAL  Final  MRSA PCR Screening     Status: None   Collection Time: 01/14/15 10:50 AM  Result Value Ref Range Status   MRSA by PCR NEGATIVE NEGATIVE Final    Comment:        The GeneXpert MRSA Assay (FDA approved for NASAL specimens only), is one component of a comprehensive MRSA colonization surveillance program. It is not intended to diagnose MRSA infection nor to guide or monitor treatment for MRSA infections.      Medications:   . aspirin  81 mg Oral Daily  . cefTRIAXone (ROCEPHIN)  IV  1 g Intravenous Q24H  . darifenacin  7.5 mg Oral Daily  . donepezil  10 mg Oral QHS  . doxycycline  100 mg Oral Q12H  . gabapentin  100 mg Oral QHS  . hydrOXYzine  5 mg Oral BID  . memantine  28 mg Oral Daily  . metoprolol tartrate  25 mg Oral QID  . sodium chloride  250 mL Intravenous Once  . sodium chloride  3 mL Intravenous Q12H  . sodium chloride  3 mL Intravenous Q12H   Continuous Infusions:    Time spent: 25 min   LOS: 4 days   [facility]  [facility] Pager [phone]  *Please refer to [email or web address], password [email or web address] to get updated schedule on who will round on this patient, as hospitalists switch teams weekly. If 7PM-7AM, please contact  night-coverage at [email or web address], password [email or web address] for any overnight needs.  01/14/2015, 2:05 PM

## 2015-01-14 NOTE — Care Management Note (Signed)
Case Management Note  Patient Details  Name: URIAH PHILIPSON MRN: [id number] Date of Birth: [birth year]  Subjective/Objective:      Adm w pericardial effusion              Action/Plan:lives w wife, has some hired assist. Act w [facility] for hhpt and hhot pta. alertd donna w [facility] of adm.   Expected Discharge Date:                  Expected Discharge Plan:  [place]  In-House Referral:     Discharge planning Services  CM Consult  Post Acute Care Choice:  Home Health Choice offered to:  Patient  DME Arranged:    DME Agency:     HH Arranged:  PT, OT HH Agency:  [facility]  Status of Service:  Completed, signed off  Medicare Important Message Given:    Date Medicare IM Given:    Medicare IM give by:    Date Additional Medicare IM Given:    Additional Medicare Important Message give by:     If discussed at [place] of Stay Meetings, dates discussed:    Additional Comments:[facility]c to follow along til dc.  Lacretia Leigh, RN 01/14/2015, 9:44 AM

## 2015-01-14 NOTE — Progress Notes (Signed)
Speech Language Pathology Treatment: Dysphagia  Patient Details Name: [name] MRN: [id number] DOB: 08-23-1927 Today's Date: 01/14/2015 Time: 5277-8242 SLP Time Calculation (min) (ACUTE ONLY): 10 min  Assessment / Plan / Recommendation Clinical Impression  Pt sitting upright appearing comfortable in bed.  Thin water in cup with straw noted at bedside.  Pt disoriented to place, ? Baseline status as he has dementia.  Observed pt consuming thin water via straw- decreased laryngeal elevation palpated at bedside but no indications of airway compromise.  Pt more alert today and interactive.  He was able to hold his own cup and was fully cooperative.    As pt is afebrile with lungs clear, recommend advance diet to dys3/thin. SLP to follow up to assure tolerance of po diet and assess for indication for instrumental swallow evaluation.  New swallow precaution sign posted above bed as his prior sign did not accompany him to his new room.     HPI Other Pertinent Information: 81 yo male adm to [facility] with respiratory deficits.  PMH + for cognitive/memory deficits, Afib, bladder cancer, DM, arthritis, GERD.  Swallow evaluation ordered due to concerns for possible dysphagia.  Pt CXR showed pericardial effusion suspected and left lobe opacity = could not rule out pna.     Pertinent Vitals Pain Assessment: No/denies pain  SLP Plan  Continue with current plan of care    Recommendations Diet recommendations: Dysphagia 3 (mechanical soft);Thin liquid Liquids provided via: Cup;Straw Medication Administration:  (as tolerated) Supervision: Patient able to self feed Compensations: Slow rate;Small sips/bites Postural Changes and/or Swallow Maneuvers: Seated upright 90 degrees;Upright 30-60 min after meal              Oral Care Recommendations: Oral care BID Follow up Recommendations: None Plan: Continue with current plan of care    [place], [place], [place] [facility]  SLP [phone]

## 2015-01-14 NOTE — Clinical Social Work Note (Signed)
CSW received referral regarding advance directives for patient. Per chart patient is currently disoriented and completing an advance directive with the patient would not be appropriate at this time. Please consult spiritual care department for advance directives if patient becomes completely oriented and is able to articulate what he is signing. CSW signing off.   Liz Beach MSW, [place], [place], [phone]

## 2015-01-14 NOTE — Care Management Important Message (Signed)
Important Message  Patient Details  Name: [name] MRN: [id number] Date of Birth: 17-Apr-1927   Medicare Important Message Given:  Yes-second notification given    Delorse Lek 01/14/2015, 2:01 PM

## 2015-01-14 NOTE — Progress Notes (Signed)
Echocardiogram 2D Echocardiogram has been performed.  [name] 01/14/2015, 1:07 PM

## 2015-01-14 NOTE — Progress Notes (Signed)
Patient refused to take his donepezil, doxycycline, and gabapentin. Multiple attempts made to get him to take these medications. Patient educated on purpose of medications and importance of taking them.

## 2015-01-14 NOTE — Progress Notes (Signed)
Subjective: Breathing OK  No CP   Objective: Filed Vitals:   01/14/15 0000 01/14/15 0341 01/14/15 0400 01/14/15 0748  BP: 120/55 145/63 130/53 146/68  Pulse:  97  101  Temp:    97.1 F (36.2 C)  TempSrc:  Oral  Oral  Resp: 24 28 21 22   Height:      Weight:      SpO2:  94%  89%   Weight change:   Intake/Output Summary (Last 24 hours) at 01/14/15 0804 Last data filed at 01/14/15 0600  Gross per 24 hour  Intake    375 ml  Output    860 ml  Net   -485 ml   Sats go to 89 on RA   General: Alert, awake, oriented x3, in no acute distress Neck:  JVP is normal Heart: Irregular rate and rhythm, without murmurs, rubs, gallops.  Lungs: Clear to auscultation.  No rales or wheezes. Exemities:  No edema.   Neuro: Grossly intact, nonfocal.  Tele  Afib  100s   Lab Results: Results for orders placed or performed during the hospital encounter of 01/10/15 (from the past 24 hour(s))  Culture, body fluid-bottle     Status: None (Preliminary result)   Collection Time: 01/13/15 11:44 AM  Result Value Ref Range   Specimen Description FLUID PERITONEAL    Special Requests BAA 10MLS    Culture PENDING    Report Status PENDING   Gram stain     Status: None   Collection Time: 01/13/15 11:44 AM  Result Value Ref Range   Specimen Description FLUID PERITONEAL    Special Requests NONE    Gram Stain      MODERATE WBC PRESENT,BOTH PMN AND MONONUCLEAR NO ORGANISMS SEEN    Report Status 01/13/2015 FINAL   Body fluid cell count with differential     Status: Abnormal   Collection Time: 01/13/15 11:45 AM  Result Value Ref Range   Fluid Type-FCT PERICARDIAL    Color, Fluid RED (A) YELLOW   Appearance, Fluid TURBID (A) CLEAR   WBC, Fluid 2326 (H) 0 - 1000 cu mm   Neutrophil Count, Fluid 93 (H) 0 - 25 %   Lymphs, Fluid 5 %   Monocyte-Macrophage-Serous Fluid 2 (L) 50 - 90 %  Lactate dehydrogenase (CSF, pleural or peritoneal fluid)     Status: Abnormal   Collection Time: 01/13/15 11:45 AM    Result Value Ref Range   LD, Fluid 627 (H) 3 - 23 U/L   Fluid Type-FLDH PERICARDIAL   Protein, pleural or peritoneal fluid     Status: None   Collection Time: 01/13/15 11:45 AM  Result Value Ref Range   Total protein, fluid 4.4 g/dL   Fluid Type-FTP PERICARDIAL   Glucose, pleural or peritoneal fluid     Status: None   Collection Time: 01/13/15 11:45 AM  Result Value Ref Range   Glucose, Fluid 100 mg/dL   Fluid Type-FGLU PERICARDIAL   Basic metabolic panel     Status: Abnormal   Collection Time: 01/14/15  2:35 AM  Result Value Ref Range   Sodium 143 135 - 145 mmol/L   Potassium 4.1 3.5 - 5.1 mmol/L   Chloride 104 101 - 111 mmol/L   CO2 29 22 - 32 mmol/L   Glucose, Bld 149 (H) 65 - 99 mg/dL   BUN 17 6 - 20 mg/dL   Creatinine, Ser 0.68 0.61 - 1.24 mg/dL   Calcium 8.8 (L) 8.9 - 10.3 mg/dL  GFR calc non Af Amer >60 >60 mL/min   GFR calc Af Amer >60 >60 mL/min   Anion gap 10 5 - 15    Studies/Results: No results found.  Medications: Reviewed  @PROBHOSP @  1  Pericardial effusion.  Awaiting pathology  Repeat echo today If no signif reaccumulation then d/c tube  Noncontrast CT of chest in Aug without signif lesion  CAD noted.  CT in April small nodule seen  [facility] have repeat  (defer to primary service to order)  2  Chronic atrial fib  Rates in 100s  BP now up  WIll increase metoprolol to 25 qid   Follow   Not an anticoag candidate due to GI bleeding      LOS: 4 days   Dorris Carnes 01/14/2015, 8:04 AM

## 2015-01-15 ENCOUNTER — Inpatient Hospital Stay (HOSPITAL_COMMUNITY): Payer: Medicare Other

## 2015-01-15 DIAGNOSIS — J984 Other disorders of lung: Secondary | ICD-10-CM

## 2015-01-15 DIAGNOSIS — I309 Acute pericarditis, unspecified: Secondary | ICD-10-CM | POA: Insufficient documentation

## 2015-01-15 DIAGNOSIS — D72829 Elevated white blood cell count, unspecified: Secondary | ICD-10-CM

## 2015-01-15 DIAGNOSIS — R627 Adult failure to thrive: Secondary | ICD-10-CM

## 2015-01-15 LAB — COMPREHENSIVE METABOLIC PANEL
ALT: 35 U/L (ref 17–63)
AST: 43 U/L — ABNORMAL HIGH (ref 15–41)
Albumin: 2.3 g/dL — ABNORMAL LOW (ref 3.5–5.0)
Alkaline Phosphatase: 138 U/L — ABNORMAL HIGH (ref 38–126)
Anion gap: 11 (ref 5–15)
BUN: 13 mg/dL (ref 6–20)
CO2: 29 mmol/L (ref 22–32)
CREATININE: 0.63 mg/dL (ref 0.61–1.24)
Calcium: 8.7 mg/dL — ABNORMAL LOW (ref 8.9–10.3)
Chloride: 103 mmol/L (ref 101–111)
Glucose, Bld: 122 mg/dL — ABNORMAL HIGH (ref 65–99)
Potassium: 4.1 mmol/L (ref 3.5–5.1)
Sodium: 143 mmol/L (ref 135–145)
Total Bilirubin: 0.4 mg/dL (ref 0.3–1.2)
Total Protein: 5.3 g/dL — ABNORMAL LOW (ref 6.5–8.1)

## 2015-01-15 LAB — CBC
HCT: 34.1 % — ABNORMAL LOW (ref 39.0–52.0)
Hemoglobin: 10.4 g/dL — ABNORMAL LOW (ref 13.0–17.0)
MCH: 25.5 pg — AB (ref 26.0–34.0)
MCHC: 30.5 g/dL (ref 30.0–36.0)
MCV: 83.6 fL (ref 78.0–100.0)
PLATELETS: 246 10*3/uL (ref 150–400)
RBC: 4.08 MIL/uL — AB (ref 4.22–5.81)
RDW: 16 % — AB (ref 11.5–15.5)
WBC: 5.8 10*3/uL (ref 4.0–10.5)

## 2015-01-15 LAB — TSH: TSH: 2.449 u[IU]/mL (ref 0.350–4.500)

## 2015-01-15 LAB — GRAM STAIN

## 2015-01-15 LAB — BODY FLUID CELL COUNT WITH DIFFERENTIAL
EOS FL: 0 %
Lymphs, Fluid: 10 %
Monocyte-Macrophage-Serous Fluid: 13 % — ABNORMAL LOW (ref 50–90)
Neutrophil Count, Fluid: 77 % — ABNORMAL HIGH (ref 0–25)
OTHER CELLS FL: REACTIVE %
Total Nucleated Cell Count, Fluid: 880 cu mm (ref 0–1000)

## 2015-01-15 LAB — LACTATE DEHYDROGENASE, PLEURAL OR PERITONEAL FLUID: LD FL: 121 U/L — AB (ref 3–23)

## 2015-01-15 LAB — GLUCOSE, SEROUS FLUID: GLUCOSE FL: 139 mg/dL

## 2015-01-15 LAB — PROTEIN, BODY FLUID

## 2015-01-15 MED ORDER — ENOXAPARIN SODIUM 40 MG/0.4ML ~~LOC~~ SOLN
40.0000 mg | SUBCUTANEOUS | Status: DC
  Administered 2015-01-15 – 2015-01-19 (×5): 40 mg via SUBCUTANEOUS
  Filled 2015-01-15 (×5): qty 0.4

## 2015-01-15 MED ORDER — LIDOCAINE HCL (PF) 1 % IJ SOLN
INTRAMUSCULAR | Status: AC
  Filled 2015-01-15: qty 10

## 2015-01-15 MED ORDER — CETYLPYRIDINIUM CHLORIDE 0.05 % MT LIQD
7.0000 mL | Freq: Two times a day (BID) | OROMUCOSAL | Status: DC
  Administered 2015-01-15 – 2015-01-20 (×10): 7 mL via OROMUCOSAL

## 2015-01-15 MED ORDER — TUBERCULIN PPD 5 UNIT/0.1ML ID SOLN
5.0000 [IU] | Freq: Once | INTRADERMAL | Status: DC
  Filled 2015-01-15: qty 0.1

## 2015-01-15 NOTE — Consult Note (Signed)
           [facility]    Date of Admission:  01/10/2015  Date of Consult:  01/15/2015  Reason for Consult: cause of pericarditis, pleural effusions and pneumonia, with concern for TB Referring Physician: Dr. Abrol   HPI: [name] is an 81 y.o. male with extensive PMHX including dementia which has been progressing recently, bladder cancer SP BCG therapy (the Mycobacterium Bovis Strain given to treat bladder cancer), atrial fibrillation, systolic heart failure aortic insufficiency, TR who presented to ED with worsening SOB, tachypnea. He was hypoxic in the field and with low grade temperature of 100.4. His caregiver had found him with labored breathing in early am after he had apparently vomited a few times. He had began also coughing up phlegm over the past several days.  IN talking to his wife she states that he did have an episode of drenching sweats one week ago.  He has been having a steady decline in functioning thought largely to be due to his dementia progressing. There has not been much other complaints of weight loss of fevers chronically.  He does have an extensive travel history from earlier when he was working having travelled to [place], [place] frequently but not [place]. He has not had known TB contacts. His CXR showed a left sided effusion on admission and ? Infiltrate as well as cardiomegaly.  He was admitted and treated initially for CAP with levaquin., then ceftriaxone and doxycycline. A TTE showed very large pericardial effusion  Dr. Harding performed pericardiocentesis and drained 1250 ml of bloody sero-sanguinous fluid from his pericardiium and left drain in place.   CTA done yesterday showed: Small to moderate pericardial effusion. Biatrial enlargement with changes of right heart failure. Moderate layering left pleural effusion with near complete left lower lobe collapse and  Multi focal airspace disease in the right  lung  Pericardial fluid studies have shown: 2326 WBC with 93% PMNs, pericardial LDH of 627, protein of 4.4. Glucose was 100. GS showed moderate WBC but no organism and culture no growth.  His pleural effusion was tapped after I was consulted and results not back until now and shows  Pleural fluid: WBC 880 with 77% PMNs, LDH of 121, glucose of 139, protein of <3. GS without any organism.  I was consulted to help workup any ID cause of his pericardial effusion, pulmonary effusions and parenchymal lung disease.  In looking through Epic I can see that the pt had a hospitalization in April for CAP and later had a CT scan of the lungs done due to patient having perrsistent cough and weakness. CXR had ? Of mass. CT had shown an area in Small focus of ground-glass attenuation in the anterior RIGHT middle lobe.  He had repeat CT in August to follow this up and this showed resolution of this area.       Past Medical History  Diagnosis Date  . Arrhythmia     a fib  . ED (erectile dysfunction)   . Memory disorder   . 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 Hematochezia     has intermittent problem  . Arthritis     DJD knee - left  . Restless leg     treats with heat  . Diabetes mellitus     NIDDM on oral medication  . Genital warts     recurrent. Not a problem  . GERD (gastroesophageal reflux   disease)     treated with PPI  . Hypertension     mild  . Neuropathy (HCC)     burning discomfort feet/legs  . Irritable bladder   . Hard of hearing   . Bladder cancer (HCC)     BCG Dr. Ron Davis    Past Surgical History  Procedure Laterality Date  . Cardiovascular stress test  03/28/2003    EF 55%.  Normal cardiolite study  . Appendectomy  1946  . Tonsillectomy  1938  . Joint replacement  1990's    left TKR  . Knee arthroscopy  1980's    right knee  . Cardiac catheterization N/A 01/13/2015    Procedure: Pericardiocentesis;  Surgeon: David W Harding, MD;  Location: [facility]  INVASIVE CV LAB;  Service: Cardiovascular;  Laterality: N/A;    Social History:  reports that he quit smoking about 39 years ago. He has quit using smokeless tobacco. He reports that he drinks about 4.2 oz of alcohol per week. He reports that he does not use illicit drugs.   Family History  Problem Relation Age of Onset  . Kidney disease Mother   . Heart disease Mother   . Stroke Mother   . Cancer Father     prostate  . Diabetes Neg Hx   . COPD Neg Hx   . Mental illness Brother     Allergies  Allergen Reactions  . Cephalexin Other (See Comments)    Unknown reaction  . Furacin [Nitrofurazone] Other (See Comments)    Unknown reaction  . Macrodantin Other (See Comments)    Unknown reaction  . Zestril [Lisinopril] Other (See Comments)    Unknown reaction     Medications: I have reviewed patients current medications as documented in Epic Anti-infectives    Start     Dose/Rate Route Frequency Ordered Stop   01/10/15 2200  doxycycline (VIBRA-TABS) tablet 100 mg     100 mg Oral Every 12 hours 01/10/15 1715 01/17/15 2159   01/10/15 1730  cefTRIAXone (ROCEPHIN) 1 g in dextrose 5 % 50 mL IVPB     1 g 100 mL/hr over 30 Minutes Intravenous Every 24 hours 01/10/15 1715 01/17/15 1729   01/10/15 1415  levofloxacin (LEVAQUIN) IVPB 750 mg     750 mg 100 mL/hr over 90 Minutes Intravenous  Once 01/10/15 1402 01/10/15 1604         ROS: not obtainable due to patients dementia  Blood pressure 154/62, pulse 79, temperature 98.5 F (36.9 C), temperature source Oral, resp. rate 18, height 6' (1.829 m), weight 216 lb 7.9 oz (98.2 kg), SpO2 93 %. General: sleepy but arousable and oriented to person HEENT: anicteric sclera,  EOMI, oropharynx clear and without exudate Cardiovascular: irr, iRR no murmurs or gallops Pulmonary: fairly  clear to auscultation bilaterally, anteriorly Gastrointestinal: soft nontender, nondistended, normal bowel sounds, Musculoskeletal: edema Skin, soft tissue:  no rashes Neuro: nonfocal, strength and sensation intact   Results for orders placed or performed during the hospital encounter of 01/10/15 (from the past 48 hour(s))  Basic metabolic panel     Status: Abnormal   Collection Time: 01/14/15  2:35 AM  Result Value Ref Range   Sodium 143 135 - 145 mmol/L   Potassium 4.1 3.5 - 5.1 mmol/L   Chloride 104 101 - 111 mmol/L   CO2 29 22 - 32 mmol/L   Glucose, Bld 149 (H) 65 - 99 mg/dL   BUN 17 6 - 20 mg/dL   Creatinine, Ser   0.68 0.61 - 1.24 mg/dL   Calcium 8.8 (L) 8.9 - 10.3 mg/dL   GFR calc non Af Amer >60 >60 mL/min   GFR calc Af Amer >60 >60 mL/min    Comment: (NOTE) The eGFR has been calculated using the CKD EPI equation. This calculation has not been validated in all clinical situations. eGFR's persistently <60 mL/min signify possible Chronic Kidney Disease.    Anion gap 10 5 - 15  MRSA PCR Screening     Status: None   Collection Time: 01/14/15 10:50 AM  Result Value Ref Range   MRSA by PCR NEGATIVE NEGATIVE    Comment:        The GeneXpert MRSA Assay (FDA approved for NASAL specimens only), is one component of a comprehensive MRSA colonization surveillance program. It is not intended to diagnose MRSA infection nor to guide or monitor treatment for MRSA infections.   CBC     Status: Abnormal   Collection Time: 01/15/15  2:36 AM  Result Value Ref Range   WBC 5.8 4.0 - 10.5 K/uL   RBC 4.08 (L) 4.22 - 5.81 MIL/uL   Hemoglobin 10.4 (L) 13.0 - 17.0 g/dL   HCT 34.1 (L) 39.0 - 52.0 %   MCV 83.6 78.0 - 100.0 fL   MCH 25.5 (L) 26.0 - 34.0 pg   MCHC 30.5 30.0 - 36.0 g/dL   RDW 16.0 (H) 11.5 - 15.5 %   Platelets 246 150 - 400 K/uL  Comprehensive metabolic panel     Status: Abnormal   Collection Time: 01/15/15  2:36 AM  Result Value Ref Range   Sodium 143 135 - 145 mmol/L   Potassium 4.1 3.5 - 5.1 mmol/L   Chloride 103 101 - 111 mmol/L   CO2 29 22 - 32 mmol/L   Glucose, Bld 122 (H) 65 - 99 mg/dL   BUN 13 6 - 20 mg/dL    Creatinine, Ser 0.63 0.61 - 1.24 mg/dL   Calcium 8.7 (L) 8.9 - 10.3 mg/dL   Total Protein 5.3 (L) 6.5 - 8.1 g/dL   Albumin 2.3 (L) 3.5 - 5.0 g/dL   AST 43 (H) 15 - 41 U/L   ALT 35 17 - 63 U/L   Alkaline Phosphatase 138 (H) 38 - 126 U/L   Total Bilirubin 0.4 0.3 - 1.2 mg/dL   GFR calc non Af Amer >60 >60 mL/min   GFR calc Af Amer >60 >60 mL/min    Comment: (NOTE) The eGFR has been calculated using the CKD EPI equation. This calculation has not been validated in all clinical situations. eGFR's persistently <60 mL/min signify possible Chronic Kidney Disease.    Anion gap 11 5 - 15  TSH     Status: None   Collection Time: 01/15/15 11:23 AM  Result Value Ref Range   TSH 2.449 0.350 - 4.500 uIU/mL  Lactate dehydrogenase (CSF, pleural or peritoneal fluid)     Status: Abnormal   Collection Time: 01/15/15  3:36 PM  Result Value Ref Range   LD, Fluid 121 (H) 3 - 23 U/L    Comment: (NOTE) Results should be evaluated in conjunction with serum values    Fluid Type-FLDH FLUID     Comment: LEFT PLEURAL CORRECTED ON 10/26 AT 1615: PREVIOUSLY REPORTED AS Pleural, L   Protein, pleural or peritoneal fluid     Status: None   Collection Time: 01/15/15  3:36 PM  Result Value Ref Range   Total protein, fluid <3.0 g/dL    Comment: (  NOTE) No normal range established for this test Results should be evaluated in conjunction with serum values    Fluid Type-FTP FLUID     Comment: LEFT PLEURAL CORRECTED ON 10/26 AT 1616: PREVIOUSLY REPORTED AS Pleural, L   Body fluid cell count with differential     Status: Abnormal   Collection Time: 01/15/15  3:36 PM  Result Value Ref Range   Fluid Type-FCT FLUID     Comment: LEFT PLEURAL CORRECTED ON 10/26 AT 1615: PREVIOUSLY REPORTED AS Pleural, L    Color, Fluid YELLOW (A) YELLOW   Appearance, Fluid CLOUDY (A) CLEAR   WBC, Fluid 880 0 - 1000 cu mm   Neutrophil Count, Fluid 77 (H) 0 - 25 %   Lymphs, Fluid 10 %   Monocyte-Macrophage-Serous Fluid 13  (L) 50 - 90 %   Eos, Fluid 0 %   Other Cells, Fluid REACTIVE MESOTHELIAL CELLS PRESENT %  Glucose, pleural or peritoneal fluid     Status: None   Collection Time: 01/15/15  3:36 PM  Result Value Ref Range   Glucose, Fluid 139 mg/dL    Comment: (NOTE) No normal range established for this test Results should be evaluated in conjunction with serum values    Fluid Type-FGLU FLUID     Comment: LEFT PLEURAL CORRECTED ON 10/26 AT 1618: PREVIOUSLY REPORTED AS Pleural, L   Gram stain     Status: None   Collection Time: 01/15/15  3:36 PM  Result Value Ref Range   Specimen Description FLUID PLEURAL LEFT    Special Requests NONE    Gram Stain      MODERATE WBC PRESENT,BOTH PMN AND MONONUCLEAR NO ORGANISMS SEEN    Report Status 01/15/2015 FINAL    @BRIEFLABTABLE(sdes,specrequest,cult,reptstatus)   ) Recent Results (from the past 720 hour(s))  Culture, Urine     Status: None   Collection Time: 01/10/15  2:24 PM  Result Value Ref Range Status   Specimen Description URINE, RANDOM  Final   Special Requests NONE  Final   Culture MULTIPLE SPECIES PRESENT, SUGGEST RECOLLECTION  Final   Report Status 01/12/2015 FINAL  Final  Culture, body fluid-bottle     Status: None (Preliminary result)   Collection Time: 01/13/15 11:44 AM  Result Value Ref Range Status   Specimen Description FLUID PERITONEAL  Final   Special Requests BAA 10MLS  Final   Culture NO GROWTH 2 DAYS  Final   Report Status PENDING  Incomplete  Gram stain     Status: None   Collection Time: 01/13/15 11:44 AM  Result Value Ref Range Status   Specimen Description FLUID PERITONEAL  Final   Special Requests NONE  Final   Gram Stain   Final    MODERATE WBC PRESENT,BOTH PMN AND MONONUCLEAR NO ORGANISMS SEEN    Report Status 01/13/2015 FINAL  Final  MRSA PCR Screening     Status: None   Collection Time: 01/14/15 10:50 AM  Result Value Ref Range Status   MRSA by PCR NEGATIVE NEGATIVE Final    Comment:        The GeneXpert  MRSA Assay (FDA approved for NASAL specimens only), is one component of a comprehensive MRSA colonization surveillance program. It is not intended to diagnose MRSA infection nor to guide or monitor treatment for MRSA infections.   Gram stain     Status: None   Collection Time: 01/15/15  3:36 PM  Result Value Ref Range Status   Specimen Description FLUID PLEURAL LEFT  Final     Special Requests NONE  Final   Gram Stain   Final    MODERATE WBC PRESENT,BOTH PMN AND MONONUCLEAR NO ORGANISMS SEEN    Report Status 01/15/2015 FINAL  Final     Impression/Recommendation  Principal Problem:   Pericardial effusion with cardiac tamponade Active Problems:   Atrial fibrillation (HCC)   Dementia   CAP (community acquired pneumonia)   Prolonged Q-T interval on ECG   Acute respiratory failure (HCC)   Chronic anemia   Chronic systolic (congestive) heart failure (HCC)   Acute encephalopathy   Atrial fibrillation with RVR (HCC)   Pericardial effusion without cardiac tamponade   Harinder B Date is a 81 y.o. male with  Admission for dysnea hypoxia found to have large pericardial effusion, pleural effusions left lobe collpase and multifocal pathology in the lungs  #1 Multifocal opacities in the lung with pleural effusion, pericardial effusion:  --given the concern raised for TB we will obtain sputa x 3 for AFB culture --continue rx for CAP with rocephin and doxy for now  #2 Pericarditis with effusion that was bloody and with slightly high WBC. The wBC in the pleural fluid was not what one would expect to find with a pyogenic bacterial purulent pericarditis. It is c/w possible TB infection vs viral vs malignant (esp given bloody appearance) vs rheumatologic or other inflammatory cause  I have added AFB stain and culture to pericardial fluid "" MTB PCR ""  I will check CMV, EBV, Coxsackie virus ESR, CRP, RF, ANA   #3 Pleural effusion: they seem much more transudative in nature but prior to  seeing the chemistries I did also add   AFB stain and culture MTB PCR from pleural fluid Adenosine Deaminase form fluid  #4 Dementia: this is more likely the cause for his failure to thrive but we should work him up for his pericarditis   01/15/2015, 6:03 PM   Thank you so much for this interesting consult  [facility] [phone] (pager) [phone] (office) 01/15/2015, 6:03 PM   [place] 01/15/2015, 6:03 PM     

## 2015-01-15 NOTE — Progress Notes (Signed)
Subjective: No CP  Breahing he says is OK   Objective: Filed Vitals:   01/15/15 0002 01/15/15 0410 01/15/15 0414 01/15/15 0813  BP: 154/76 145/68  138/64  Pulse:    74  Temp: 98.2 F (36.8 C) 98.2 F (36.8 C)  97.8 F (36.6 C)  TempSrc: Oral Oral  Oral  Resp: 23 19  22   Height:      Weight:      SpO2: 90% 85% 90% 95%   Weight change:   Intake/Output Summary (Last 24 hours) at 01/15/15 0321 Last data filed at 01/15/15 0002  Gross per 24 hour  Intake    380 ml  Output   1000 ml  Net   -620 ml    General: Awake  Confused "Hold me down, Im floating" Neck:  JVP is normal Heart: Regular rate and rhythm, without murmurs, rubs, gallops.  Lungs: Clear to auscultation.  No rales or wheezes. Exemities:  No edema.   Neuro:   Pt confused.    TEle  Atrial fib   Lab Results: Results for orders placed or performed during the hospital encounter of 01/10/15 (from the past 24 hour(s))  MRSA PCR Screening     Status: None   Collection Time: 01/14/15 10:50 AM  Result Value Ref Range   MRSA by PCR NEGATIVE NEGATIVE  CBC     Status: Abnormal   Collection Time: 01/15/15  2:36 AM  Result Value Ref Range   WBC 5.8 4.0 - 10.5 K/uL   RBC 4.08 (L) 4.22 - 5.81 MIL/uL   Hemoglobin 10.4 (L) 13.0 - 17.0 g/dL   HCT 34.1 (L) 39.0 - 52.0 %   MCV 83.6 78.0 - 100.0 fL   MCH 25.5 (L) 26.0 - 34.0 pg   MCHC 30.5 30.0 - 36.0 g/dL   RDW 16.0 (H) 11.5 - 15.5 %   Platelets 246 150 - 400 K/uL  Comprehensive metabolic panel     Status: Abnormal   Collection Time: 01/15/15  2:36 AM  Result Value Ref Range   Sodium 143 135 - 145 mmol/L   Potassium 4.1 3.5 - 5.1 mmol/L   Chloride 103 101 - 111 mmol/L   CO2 29 22 - 32 mmol/L   Glucose, Bld 122 (H) 65 - 99 mg/dL   BUN 13 6 - 20 mg/dL   Creatinine, Ser 0.63 0.61 - 1.24 mg/dL   Calcium 8.7 (L) 8.9 - 10.3 mg/dL   Total Protein 5.3 (L) 6.5 - 8.1 g/dL   Albumin 2.3 (L) 3.5 - 5.0 g/dL   AST 43 (H) 15 - 41 U/L   ALT 35 17 - 63 U/L   Alkaline Phosphatase  138 (H) 38 - 126 U/L   Total Bilirubin 0.4 0.3 - 1.2 mg/dL   GFR calc non Af Amer >60 >60 mL/min   GFR calc Af Amer >60 >60 mL/min   Anion gap 11 5 - 15    Studies/Results: Ct Angio Chest Pe W/cm &/or Wo Cm  01/14/2015  CLINICAL DATA:  Shortness of breath and hemoptysis. EXAM: CT ANGIOGRAPHY CHEST WITH CONTRAST TECHNIQUE: Multidetector CT imaging of the chest was performed using the standard protocol during bolus administration of intravenous contrast. Multiplanar CT image reconstructions and MIPs were obtained to evaluate the vascular anatomy. CONTRAST:  167mL OMNIPAQUE IOHEXOL 350 MG/ML SOLN COMPARISON:  10/28/2014 FINDINGS: THORACIC INLET/BODY WALL: No acute abnormality. MEDIASTINUM: Cardiomegaly with marked biventricular enlargement. There is a small to moderate pericardial effusion which is near water density.  Patient is status post pericardiocentesis, accounting for gas in the subxiphoid region. Detection of pericardial serosal enhancement is limited by contrast timing. Diffuse atherosclerosis, including the coronary arteries. No aortic dissection. No convincing acute pulmonary embolism with bolus dispersion and frequent motion limiting sensitivity. Right heart failure with intrahepatic vein reflux and distension. LUNG WINDOWS: Moderate layering left pleural effusion with near complete left lower lobe collapse. Patchy airspace opacity in the right lung with nonspecific pattern. No interlobular septal thickening. No primary source of hemoptysis is visualized. UPPER ABDOMEN: No acute findings. OSSEOUS: No acute fracture.  No suspicious lytic or blastic lesions. Review of the MIP images confirms the above findings. IMPRESSION: 1. Motion limited study.  No convincing acute pulmonary embolism. 2. Small to moderate pericardial effusion. No CT before the recent pericardiocentesis for comparison. 3. Biatrial enlargement with changes of right heart failure. 4. Moderate layering left pleural effusion with near  complete left lower lobe collapse. 5. Multi focal airspace disease in the right lung which could reflect pneumonia or patchy alveolar hemorrhage (given history of hemoptysis). Electronically Signed   By: Monte Fantasia M.D.   On: 01/14/2015 16:32    Medications:   @PROBHOSP @  1  Pericardial effusion.  Pt is s/p pericardiocentesis NOT a pericardial window.  Still at risk for reaccumulation and Bx of pericardium was not taken    Cytology not completed    May be helpful for thoracentesis for cells  Pt does appear to be comfortable though   WOuld check TSH  Check TB.    2  Atrial fib  Rates OK      LOS: 5 days   Dorris Carnes 01/15/2015, 9:21 AM

## 2015-01-15 NOTE — Progress Notes (Addendum)
01/15/2015 Patient transfer from [place] to 2 central at 1851. He is alert have some confusion. He was place on telemetry. Elink and central monitoring was called. Patient have bruises on arms, little pink rash to upper back. On the left side bandaid from having a thoracentesis today and sacrum pink. Received CHG bath, bed alarm was turn on and patient on airborne to r/o TB. [facility] RN.

## 2015-01-15 NOTE — Progress Notes (Signed)
Report called to receiving RN [id number]. Will transfer with Rn. Patient with no complaints at the current time. Moving patient in airborne room to r/u TB.

## 2015-01-15 NOTE — Procedures (Signed)
Successful US guided left thoracentesis. Yielded 1.1 liters of serous fluid. Pt tolerated procedure well. No immediate complications.  Specimen was sent for labs. CXR ordered.  Tsosie Billing D PA-C 01/15/2015 3:45 PM

## 2015-01-15 NOTE — Progress Notes (Addendum)
[facility] PROGRESS NOTE    Progress Note   [name] VWP:[id number] DOB: [birth year] DOA: 01/10/2015 PCP: [name], NP   Brief Narrative:   [name] is an 82 y.o. male male with past medical history of chronic atrial fibrillation not on anticoagulation secondary to GI bleed brought to the hospital because of increase shortness of breath and confusion.   Assessment/Plan:  Acute respiratory failure with hypoxia likely due to large pericardial effusion:/Moderate-sized left pleural effusion  Afebrile, continue empirically on IV Rocephin and doxycycline for possible community-acquired pneumonia versus aspiration pneumonia. Suspected parapneumonic left-sided effusion with left lower lobe collapse on CT, will consult IR for thoracentesis Repeat 2-D echo showed an EF of 45-50% with small to moderate pericardial effusion Hypoxemia improving currently requiring 2 L   Pericardial effusion without tamponade Patient status post pericardiocentesis  , 10/24 Awaiting pathology   ID consult to make further recommendations about possible infectious pericarditis?  Sepsis: Probable Cap/parapneumonic effusion? Unclear etiology of source, may be respiratory versus urinary. Resolved Continue IV Rocephin,/doxycycline day #5  Chronic  Atrial fibrillation [facility]): Now with A. fib with RVR,  Continue metoprolol IV for heart rate greater than 110.  Chronic systolic heart failure: Currently compensated. Continue Lasix  And beta blocker. Repeat 2-D echo with results as above  Acute encephalopathy with underlying diagnosis of dementia: On Namenda and Aricept likely due to infectious etiology.  Prolonged QT on EKG: Magnesium and potassium are within normal. QTC mildly improved from 580 -564.  Atrial fibrillation with RVR (HCC) Rate controlled for the most part some RVR likely from effusion. Cardiology increase metoprolol to 25 QID .. BP controlled. Not on anticoagulation due to  hx GIB.Chronic Afib rate increased secondary to pericardial effusion, now improving  Dysphagia-speech therapy recommends   Dysphagia 3 (mechanical soft);Thin liquid  DVT Prophylaxis - Lovenox ordered.  Family Communication:  [phone], discussed with [name]  Disposition Plan: Home when stable. Likely tomorrow  Code Status:     Code Status Orders        Start     Ordered   01/10/15 1716  Full code   Continuous     01/10/15 1715        IV Access:    Peripheral IV   Procedures and diagnostic studies:   Ct Angio Chest Pe W/cm &/or Wo Cm  01/14/2015  CLINICAL DATA:  Shortness of breath and hemoptysis. EXAM: CT ANGIOGRAPHY CHEST WITH CONTRAST TECHNIQUE: Multidetector CT imaging of the chest was performed using the standard protocol during bolus administration of intravenous contrast. Multiplanar CT image reconstructions and MIPs were obtained to evaluate the vascular anatomy. CONTRAST:  139mL OMNIPAQUE IOHEXOL 350 MG/ML SOLN COMPARISON:  10/28/2014 FINDINGS: THORACIC INLET/BODY WALL: No acute abnormality. MEDIASTINUM: Cardiomegaly with marked biventricular enlargement. There is a small to moderate pericardial effusion which is near water density. Patient is status post pericardiocentesis, accounting for gas in the subxiphoid region. Detection of pericardial serosal enhancement is limited by contrast timing. Diffuse atherosclerosis, including the coronary arteries. No aortic dissection. No convincing acute pulmonary embolism with bolus dispersion and frequent motion limiting sensitivity. Right heart failure with intrahepatic vein reflux and distension. LUNG WINDOWS: Moderate layering left pleural effusion with near complete left lower lobe collapse. Patchy airspace opacity in the right lung with nonspecific pattern. No interlobular septal thickening. No primary source of hemoptysis is visualized. UPPER ABDOMEN: No acute findings. OSSEOUS: No acute fracture.  No suspicious lytic or  blastic lesions. Review of the MIP images  confirms the above findings. IMPRESSION: 1. Motion limited study.  No convincing acute pulmonary embolism. 2. Small to moderate pericardial effusion. No CT before the recent pericardiocentesis for comparison. 3. Biatrial enlargement with changes of right heart failure. 4. Moderate layering left pleural effusion with near complete left lower lobe collapse. 5. Multi focal airspace disease in the right lung which could reflect pneumonia or patchy alveolar hemorrhage (given history of hemoptysis). Electronically Signed   By: Monte Fantasia M.D.   On: 01/14/2015 16:32     Medical Consultants:    None.  Anti-Infectives:   Anti-infectives    Start     Dose/Rate Route Frequency Ordered Stop   01/10/15 2200  doxycycline (VIBRA-TABS) tablet 100 mg     100 mg Oral Every 12 hours 01/10/15 1715 01/17/15 2159   01/10/15 1730  cefTRIAXone (ROCEPHIN) 1 g in dextrose 5 % 50 mL IVPB     1 g 100 mL/hr over 30 Minutes Intravenous Every 24 hours 01/10/15 1715 01/17/15 1729   01/10/15 1415  levofloxacin (LEVAQUIN) IVPB 750 mg     750 mg 100 mL/hr over 90 Minutes Intravenous  Once 01/10/15 1402 01/10/15 1604      Subjective:    [name] patient denies any chest pain or shortness of breath, also requirements have gone up from 2 L to 4 L. Patient was 89% on 2 L today  Objective:    Filed Vitals:   01/15/15 0813 01/15/15 1000 01/15/15 1019 01/15/15 1131  BP: 138/64  152/94 133/68  Pulse: 74  96 73  Temp: 97.8 F (36.6 C)   98.2 F (36.8 C)  TempSrc: Oral   Oral  Resp: 22   21  Height:      Weight:      SpO2: 95% 94% 89% 95%    Intake/Output Summary (Last 24 hours) at 01/15/15 1354 Last data filed at 01/15/15 0813  Gross per 24 hour  Intake    380 ml  Output    975 ml  Net   -595 ml   Filed Weights   01/10/15 1657  Weight: 98.2 kg (216 lb 7.9 oz)    Exam: Gen:  NAD Cardiovascular:  Irregular rate and rhythm Chest and lungs:   Good air  movement with crackles on the right. No wheezing rails. Abdomen:  Abdomen soft, NT/ND, + BS Extremities:  No C/E/C   Data Reviewed:    Labs: Basic Metabolic Panel:  Recent Labs Lab 01/10/15 1235 01/10/15 1844 01/12/15 1035 01/13/15 0239 01/14/15 0235 01/15/15 0236  NA 134*  --  138 141 143 143  K 4.5  --  4.2 4.3 4.1 4.1  CL 96*  --  98* 103 104 103  CO2 31  --  31 31 29 29   GLUCOSE 184*  --  142* 128* 149* 122*  BUN 11  --  15 16 17 13   CREATININE 0.91  --  0.90 0.68 0.68 0.63  CALCIUM 8.8*  --  8.7* 8.8* 8.8* 8.7*  MG  --  2.1  --   --   --   --    GFR Estimated Creatinine Clearance: 81.9 mL/min (by C-G formula based on Cr of 0.63). Liver Function Tests:  Recent Labs Lab 01/10/15 1235 01/15/15 0236  AST 25 43*  ALT 20 35  ALKPHOS 170* 138*  BILITOT 0.5 0.4  PROT 6.9 5.3*  ALBUMIN 3.0* 2.3*   No results for input(s): LIPASE, AMYLASE in the last 168 hours.  Recent Labs Lab 01/10/15 1714  AMMONIA 10   Coagulation profile  Recent Labs Lab 01/12/15 1035  INR 1.46    CBC:  Recent Labs Lab 01/09/15 1422 01/10/15 1235 01/12/15 1035 01/15/15 0236  WBC 7.0 7.9 7.5 5.8  NEUTROABS 5.4 6.8  --   --   HGB 10.8* 11.5* 10.8* 10.4*  HCT 33.7* 36.8* 35.6* 34.1*  MCV 81.2 83.6 83.2 83.6  PLT 281 238 247 246   Cardiac Enzymes: No results for input(s): CKTOTAL, CKMB, CKMBINDEX, TROPONINI in the last 168 hours. BNP (last 3 results) No results for input(s): PROBNP in the last 8760 hours. CBG:  Recent Labs Lab 01/12/15 0819  GLUCAP 131*   D-Dimer: No results for input(s): DDIMER in the last 72 hours. Hgb A1c: No results for input(s): HGBA1C in the last 72 hours. Lipid Profile: No results for input(s): CHOL, HDL, LDLCALC, TRIG, CHOLHDL, LDLDIRECT in the last 72 hours. Thyroid function studies:  Recent Labs  01/15/15 1123  TSH 2.449   Anemia work up: No results for input(s): VITAMINB12, FOLATE, FERRITIN, TIBC, IRON, RETICCTPCT in the last 72  hours. Sepsis Labs:  Recent Labs Lab 01/09/15 1422 01/10/15 1234 01/10/15 1235 01/12/15 1035 01/15/15 0236  WBC 7.0  --  7.9 7.5 5.8  LATICACIDVEN  --  1.22  --   --   --    Microbiology Recent Results (from the past 240 hour(s))  Culture, Urine     Status: None   Collection Time: 01/10/15  2:24 PM  Result Value Ref Range Status   Specimen Description URINE, RANDOM  Final   Special Requests NONE  Final   Culture MULTIPLE SPECIES PRESENT, SUGGEST RECOLLECTION  Final   Report Status 01/12/2015 FINAL  Final  Culture, body fluid-bottle     Status: None (Preliminary result)   Collection Time: 01/13/15 11:44 AM  Result Value Ref Range Status   Specimen Description FLUID PERITONEAL  Final   Special Requests BAA 10MLS  Final   Culture NO GROWTH 2 DAYS  Final   Report Status PENDING  Incomplete  Gram stain     Status: None   Collection Time: 01/13/15 11:44 AM  Result Value Ref Range Status   Specimen Description FLUID PERITONEAL  Final   Special Requests NONE  Final   Gram Stain   Final    MODERATE WBC PRESENT,BOTH PMN AND MONONUCLEAR NO ORGANISMS SEEN    Report Status 01/13/2015 FINAL  Final  MRSA PCR Screening     Status: None   Collection Time: 01/14/15 10:50 AM  Result Value Ref Range Status   MRSA by PCR NEGATIVE NEGATIVE Final    Comment:        The GeneXpert MRSA Assay (FDA approved for NASAL specimens only), is one component of a comprehensive MRSA colonization surveillance program. It is not intended to diagnose MRSA infection nor to guide or monitor treatment for MRSA infections.      Medications:   . antiseptic oral rinse  7 mL Mouth Rinse BID  . aspirin  81 mg Oral Daily  . cefTRIAXone (ROCEPHIN)  IV  1 g Intravenous Q24H  . darifenacin  7.5 mg Oral Daily  . donepezil  10 mg Oral QHS  . doxycycline  100 mg Oral Q12H  . gabapentin  100 mg Oral QHS  . hydrOXYzine  5 mg Oral BID  . memantine  28 mg Oral Daily  . metoprolol tartrate  25 mg Oral QID    . sodium chloride  250 mL Intravenous Once  . sodium chloride  3 mL Intravenous Q12H  . sodium chloride  3 mL Intravenous Q12H   Continuous Infusions:    Time spent: 25 min   LOS: 5 days   [facility]  [facility] Pager [phone]  *Please refer to [email or web address], password [email or web address] to get updated schedule on who will round on this patient, as hospitalists switch teams weekly. If 7PM-7AM, please contact night-coverage at [email or web address], password [email or web address] for any overnight needs.  01/15/2015, 1:54 PM

## 2015-01-15 NOTE — Progress Notes (Signed)
Physical Therapy Treatment Patient Details Name: [name] MRN: [id number] DOB: 1927-06-09 Today's Date: 01/15/2015    History of Present Illness pt is an 81 y/o male with PMHx of chronic afib, dm, lumbar disc ds and dementia admitted to ED with increased SOB and resp rate.  pt found to have sepsis from resp vs urinary source and resp failure likely due to large pericardial effusion, s/p pericardiocentesis 10/24.    PT Comments    Pt admitted with above diagnosis. Pt currently with functional limitations due to balance and endurance deficits. Pt progressing although slowly.  Confusion increased today per nursing.  Pt was able to ambulate a short distance today.  Will need 24 hour care. Pt will benefit from skilled PT to increase their independence and safety with mobility to allow discharge to the venue listed below.    Follow Up Recommendations  Home health PT;Supervision/Assistance - 24 hour     Equipment Recommendations  None recommended by PT    Recommendations for Other Services       Precautions / Restrictions Precautions Precautions: Fall Restrictions Weight Bearing Restrictions: No    Mobility  Bed Mobility Overal bed mobility: Needs Assistance Bed Mobility: Supine to Sit;Sit to Supine     Supine to sit: Min assist Sit to supine: Min assist   General bed mobility comments: min truncal assist and cues for redirection  Transfers Overall transfer level: Needs assistance Equipment used: Rolling walker (2 wheeled) Transfers: Sit to/from Stand Sit to Stand: Mod assist         General transfer comment: assist to come forward, pt with moderate list posteriorly  Ambulation/Gait Ambulation/Gait assistance: Min assist;+2 safety/equipment Ambulation Distance (Feet): 65 Feet Assistive device: Rolling walker (2 wheeled) Gait Pattern/deviations: Step-through pattern;Decreased stride length;Trunk flexed;Wide base of support;Staggering right;Staggering left;Leaning  posteriorly   Gait velocity interpretation: Below normal speed for age/gender General Gait Details: Pt was able to ambulate with RW with cues to stay close to RW.  Pt also needed assist to guide RW.  Pt also needed cues for posture.  Nurse wanted pt back in bed as he has been confused today thereofre ambulated and placed pt back in bed.     Stairs            Wheelchair Mobility    Modified Rankin (Stroke Patients Only)       Balance Overall balance assessment: Needs assistance Sitting-balance support: Single extremity supported;Feet supported Sitting balance-Leahy Scale: Fair     Standing balance support: Bilateral upper extremity supported;During functional activity Standing balance-Leahy Scale: Poor Standing balance comment: reliant on the RW for assist.                     Cognition Arousal/Alertness: Awake/alert Behavior During Therapy: Restless;WFL for tasks assessed/performed Overall Cognitive Status: History of cognitive impairments - at baseline                      Exercises      General Comments        Pertinent Vitals/Pain Pain Assessment: No/denies pain  HR up to 138 bpm with activity. O2 sats on return to room at 83% on RA.  Replaced O2 at 2L but sats still 86-88%.  Increased O2 to 4LO2 to get sats >90%.  Nurse aware.      Home Living                      Prior Function  PT Goals (current goals can now be found in the care plan section) Progress towards PT goals: Progressing toward goals    Frequency  Min 3X/week    PT Plan Current plan remains appropriate    Co-evaluation             End of Session Equipment Utilized During Treatment: Gait belt;Oxygen Activity Tolerance: Patient tolerated treatment well Patient left: in bed;with call bell/phone within reach;with bed alarm set     Time: 1001-1020 PT Time Calculation (min) (ACUTE ONLY): 19 min  Charges:  $Gait Training: 8-22 mins                     G CodesDenice Paradise 2015/02/06, 11:58 AM [facility] [phone] (pager)

## 2015-01-15 NOTE — Progress Notes (Signed)
Speech Language Pathology Treatment: Dysphagia  Patient Details Name: [name] MRN: [id number] DOB: 05/04/1927 Today's Date: 01/15/2015 Time: 4098-1191 SLP Time Calculation (min) (ACUTE ONLY): 23 min  Assessment / Plan / Recommendation Clinical Impression  Pt today is disoriented and having visual hallucinations - seeing a dog and people in the room with him.  Set up pt to eat pudding, peaches, apple juice *nectar, water and ice chips.  No s/s of aspiration noted and pt feeds himself slowly. Pt able to masticate adequately with overall timely swallow.  No indications of residuals or pt complaint of dysphagia.  He did benefit from min cues to clear mouth before taking more.   Subtle cough at end of snack noted.    SLP can not rule out SILENT aspiration given pt has dementia and findings of CT showing multifocal airspace disease - ? pna or patchy alveolar hemorrhage *h/o hemoptysis.    Recommend continue diet with precautions to maximize airway protection.  Unless MD desires to rule out silent aspiration with MBS, SLP to sign off.     HPI Other Pertinent Information: 81 yo male adm to [facility] with respiratory deficits.  PMH + for cognitive/memory deficits, Afib, bladder cancer, DM, arthritis, GERD.  Swallow evaluation ordered due to concerns for possible dysphagia.  Pt CXR showed pericardial effusion suspected and left lobe opacity = could not rule out pna.     Pertinent Vitals Pain Assessment: No/denies pain  SLP Plan  All goals met    Recommendations Diet recommendations: Dysphagia 3 (mechanical soft);Thin liquid Liquids provided via: Straw Medication Administration: Whole meds with puree Supervision: Patient able to self feed Compensations: Small sips/bites;Slow rate Postural Changes and/or Swallow Maneuvers: Seated upright 90 degrees;Upright 30-60 min after meal              Plan: All goals met    [place], [place] [facility] SLP [phone]

## 2015-01-15 NOTE — Progress Notes (Signed)
ANTICOAGULATION CONSULT NOTE - Initial Consult  Pharmacy Consult for Lovenox Indication: VTE prophylaxis  Allergies  Allergen Reactions  . Cephalexin Other (See Comments)    Unknown reaction  . Furacin [Nitrofurazone] Other (See Comments)    Unknown reaction  . Macrodantin Other (See Comments)    Unknown reaction  . Zestril [Lisinopril] Other (See Comments)    Unknown reaction    Patient Measurements: Height: 6' (182.9 cm) Weight: 216 lb 7.9 oz (98.2 kg) IBW/kg (Calculated) : 77.6   Vital Signs: Temp: 98.2 F (36.8 C) (10/26 1131) Temp Source: Oral (10/26 1131) BP: 133/68 mmHg (10/26 1131) Pulse Rate: 73 (10/26 1131)  Labs:  Recent Labs  01/13/15 0239 01/14/15 0235 01/15/15 0236  HGB  --   --  10.4*  HCT  --   --  34.1*  PLT  --   --  246  CREATININE 0.68 0.68 0.63    Estimated Creatinine Clearance: 81.9 mL/min (by C-G formula based on Cr of 0.63).   Medical History: Past Medical History  Diagnosis Date  . Arrhythmia     a fib  . ED (erectile dysfunction)   . Memory disorder   . 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 Hematochezia     has intermittent problem  . Arthritis     DJD knee - left  . Restless leg     treats with heat  . Diabetes mellitus     NIDDM on oral medication  . Genital warts     recurrent. Not a problem  . GERD (gastroesophageal reflux disease)     treated with PPI  . Hypertension     mild  . Neuropathy (HCC)     burning discomfort feet/legs  . Irritable bladder   . Hard of hearing   . Bladder cancer ([place])     BCG [name]    Medications:  Scheduled:  . antiseptic oral rinse  7 mL Mouth Rinse BID  . aspirin  81 mg Oral Daily  . cefTRIAXone (ROCEPHIN)  IV  1 g Intravenous Q24H  . darifenacin  7.5 mg Oral Daily  . donepezil  10 mg Oral QHS  . doxycycline  100 mg Oral Q12H  . enoxaparin (LOVENOX) injection  40 mg Subcutaneous Q24H  . gabapentin  100 mg Oral QHS  . hydrOXYzine  5 mg Oral BID  .  lidocaine (PF)      . memantine  28 mg Oral Daily  . metoprolol tartrate  25 mg Oral QID  . sodium chloride  250 mL Intravenous Once  . sodium chloride  3 mL Intravenous Q12H  . sodium chloride  3 mL Intravenous Q12H    Assessment: 81 yo male with PMH chronic Afib admitted with increased SOB and confusion.  Patient not on any anticoagulation at home secondary to GI bleed.  Pharmacy consulted to dose Lovenox for DVT prophylaxis  CBC    Component Value Date/Time   WBC 5.8 01/15/2015 0236   RBC 4.08* 01/15/2015 0236   RBC 2.70* 07/10/2014 1243   HGB 10.4* 01/15/2015 0236   HCT 34.1* 01/15/2015 0236   PLT 246 01/15/2015 0236   MCV 83.6 01/15/2015 0236   MCH 25.5* 01/15/2015 0236   MCHC 30.5 01/15/2015 0236   RDW 16.0* 01/15/2015 0236   LYMPHSABS 0.3* 01/10/2015 1235   MONOABS 0.8 01/10/2015 1235   EOSABS 0.0 01/10/2015 1235   BASOSABS 0.0 01/10/2015 1235  Goal of Therapy:   Monitor platelets by anticoagulation protocol: Yes   Plan:  - Begin Lovenox 40 mg sub-q every 24 hours for DVT prophylaxis - Continue to monitor CBC, signs/sx of bleeding  Thanks for consult!  Reatha Harps, Pharm.D., BCPS Clinical Pharmacist 01/15/2015 3:31 PM

## 2015-01-16 DIAGNOSIS — Z9889 Other specified postprocedural states: Secondary | ICD-10-CM | POA: Insufficient documentation

## 2015-01-16 LAB — AMYLASE, PLEURAL FLUID: Amylase, Pleural Fluid: 9 U/L

## 2015-01-16 LAB — TRIGLYCERIDES, BODY FLUIDS: Triglycerides, Fluid: 12 mg/dL

## 2015-01-16 LAB — CBC
HCT: 33.3 % — ABNORMAL LOW (ref 39.0–52.0)
Hemoglobin: 10.1 g/dL — ABNORMAL LOW (ref 13.0–17.0)
MCH: 25.3 pg — ABNORMAL LOW (ref 26.0–34.0)
MCHC: 30.3 g/dL (ref 30.0–36.0)
MCV: 83.5 fL (ref 78.0–100.0)
PLATELETS: 240 10*3/uL (ref 150–400)
RBC: 3.99 MIL/uL — ABNORMAL LOW (ref 4.22–5.81)
RDW: 16.1 % — AB (ref 11.5–15.5)
WBC: 4.7 10*3/uL (ref 4.0–10.5)

## 2015-01-16 LAB — HIV ANTIBODY (ROUTINE TESTING W REFLEX): HIV SCREEN 4TH GENERATION: NONREACTIVE

## 2015-01-16 LAB — COMPREHENSIVE METABOLIC PANEL
ALK PHOS: 140 U/L — AB (ref 38–126)
ALT: 32 U/L (ref 17–63)
ANION GAP: 5 (ref 5–15)
AST: 32 U/L (ref 15–41)
Albumin: 2.1 g/dL — ABNORMAL LOW (ref 3.5–5.0)
BILIRUBIN TOTAL: 0.4 mg/dL (ref 0.3–1.2)
BUN: 11 mg/dL (ref 6–20)
CALCIUM: 8.2 mg/dL — AB (ref 8.9–10.3)
CO2: 31 mmol/L (ref 22–32)
CREATININE: 0.64 mg/dL (ref 0.61–1.24)
Chloride: 103 mmol/L (ref 101–111)
GFR calc non Af Amer: >60 mL/min (ref >60)
Glucose, Bld: 98 mg/dL (ref 65–99)
Potassium: 3.7 mmol/L (ref 3.5–5.1)
Sodium: 139 mmol/L (ref 135–145)
TOTAL PROTEIN: 4.9 g/dL — AB (ref 6.5–8.1)

## 2015-01-16 LAB — C-REACTIVE PROTEIN: CRP: 4 mg/dL — AB (ref <1.0)

## 2015-01-16 LAB — PH, BODY FLUID: pH, Body Fluid: 8.6

## 2015-01-16 LAB — ANTINUCLEAR ANTIBODIES, IFA: ANTINUCLEAR ANTIBODIES, IFA: NEGATIVE

## 2015-01-16 LAB — SEDIMENTATION RATE: SED RATE: 48 mm/h — AB (ref 0–16)

## 2015-01-16 LAB — STREP PNEUMONIAE URINARY ANTIGEN: Strep Pneumo Urinary Antigen: NEGATIVE

## 2015-01-16 MED ORDER — SODIUM CHLORIDE 3 % IN NEBU
4.0000 mL | INHALATION_SOLUTION | Freq: Three times a day (TID) | RESPIRATORY_TRACT | Status: AC
  Administered 2015-01-16 – 2015-01-17 (×3): 4 mL via RESPIRATORY_TRACT
  Filled 2015-01-16 (×3): qty 4

## 2015-01-16 MED ORDER — SODIUM CHLORIDE 3 % IN NEBU
15.0000 mL | INHALATION_SOLUTION | Freq: Three times a day (TID) | RESPIRATORY_TRACT | Status: DC
  Filled 2015-01-16 (×3): qty 15

## 2015-01-16 NOTE — Progress Notes (Signed)
RT attempted to collect an induced sputum sample using nebulized hypertonic saline. During and after the nebulized treatment, the patient was unable to cough up anything for a sample. RN notified.

## 2015-01-16 NOTE — Progress Notes (Signed)
[place] [facility]    Subjective: sleeping  Antibiotics:  Anti-infectives    Start     Dose/Rate Route Frequency Ordered Stop   01/10/15 2200  doxycycline (VIBRA-TABS) tablet 100 mg     100 mg Oral Every 12 hours 01/10/15 1715 01/17/15 2159   01/10/15 1730  cefTRIAXone (ROCEPHIN) 1 g in dextrose 5 % 50 mL IVPB     1 g 100 mL/hr over 30 Minutes Intravenous Every 24 hours 01/10/15 1715 01/17/15 1729   01/10/15 1415  levofloxacin (LEVAQUIN) IVPB 750 mg     750 mg 100 mL/hr over 90 Minutes Intravenous  Once 01/10/15 1402 01/10/15 1604      Medications: Scheduled Meds: . antiseptic oral rinse  7 mL Mouth Rinse BID  . aspirin  81 mg Oral Daily  . cefTRIAXone (ROCEPHIN)  IV  1 g Intravenous Q24H  . darifenacin  7.5 mg Oral Daily  . donepezil  10 mg Oral QHS  . doxycycline  100 mg Oral Q12H  . enoxaparin (LOVENOX) injection  40 mg Subcutaneous Q24H  . gabapentin  100 mg Oral QHS  . hydrOXYzine  5 mg Oral BID  . memantine  28 mg Oral Daily  . metoprolol tartrate  25 mg Oral QID  . sodium chloride  250 mL Intravenous Once  . sodium chloride  3 mL Intravenous Q12H  . sodium chloride  3 mL Intravenous Q12H   Continuous Infusions:  PRN Meds:.sodium chloride, sodium chloride, metoprolol, nitroGLYCERIN, RESOURCE THICKENUP CLEAR, sodium chloride, sodium chloride    Objective: Weight change:   Intake/Output Summary (Last 24 hours) at 01/16/15 0942 Last data filed at 01/15/15 2100  Gross per 24 hour  Intake    370 ml  Output    150 ml  Net    220 ml   Blood pressure 106/47, pulse 68, temperature 97.9 F (36.6 C), temperature source Axillary, resp. rate 16, height 6' 3"  (1.905 m), weight 204 lb 2.3 oz (92.6 kg), SpO2 93 %. Temp:  [97.5 F (36.4 C)-98.6 F (37 C)] 97.9 F (36.6 C) (10/27 0836) Pulse Rate:  [60-96] 68 (10/27 0836) Resp:  [16-24] 16 (10/27 0836) BP: (97-154)/(47-94) 106/47 mmHg (10/27 0800) SpO2:  [89 %-98 %] 93 %  (10/27 0800) Weight:  [204 lb 2.3 oz (92.6 kg)] 204 lb 2.3 oz (92.6 kg) (10/26 1901)  Physical Exam: General:sleeping Cardiovascular: irr, iRR no murmurs or gallops Pulmonary: fairly clear to auscultation bilaterally, anteriorly Gastrointestinal: soft  nondistended, normal bowel sounds, Musculoskeletal: edema Skin, soft tissue: no rashes Neuro: nonfocal  CBC: CBC Latest Ref Rng 01/16/2015 01/15/2015 01/12/2015  WBC 4.0 - 10.5 K/uL 4.7 5.8 7.5  Hemoglobin 13.0 - 17.0 g/dL 10.1(L) 10.4(L) 10.8(L)  Hematocrit 39.0 - 52.0 % 33.3(L) 34.1(L) 35.6(L)  Platelets 150 - 400 K/uL 240 246 247       BMET  Recent Labs  01/15/15 0236 01/16/15 0325  NA 143 139  K 4.1 3.7  CL 103 103  CO2 29 31  GLUCOSE 122* 98  BUN 13 11  CREATININE 0.63 0.64  CALCIUM 8.7* 8.2*     Liver Panel   Recent Labs  01/15/15 0236 01/16/15 0325  PROT 5.3* 4.9*  ALBUMIN 2.3* 2.1*  AST 43* 32  ALT 35 32  ALKPHOS 138* 140*  BILITOT 0.4 0.4       Sedimentation Rate  Recent Labs  01/16/15 0325  ESRSEDRATE 48*  C-Reactive Protein  Recent Labs  01/16/15 0325  CRP 4.0*    Micro Results: Recent Results (from the past 720 hour(s))  Culture, Urine     Status: None   Collection Time: 01/10/15  2:24 PM  Result Value Ref Range Status   Specimen Description URINE, RANDOM  Final   Special Requests NONE  Final   Culture MULTIPLE SPECIES PRESENT, SUGGEST RECOLLECTION  Final   Report Status 01/12/2015 FINAL  Final  Culture, body fluid-bottle     Status: None (Preliminary result)   Collection Time: 01/13/15 11:44 AM  Result Value Ref Range Status   Specimen Description FLUID PERITONEAL  Final   Special Requests BAA 10MLS  Final   Culture NO GROWTH 2 DAYS  Final   Report Status PENDING  Incomplete  Gram stain     Status: None   Collection Time: 01/13/15 11:44 AM  Result Value Ref Range Status   Specimen Description FLUID PERITONEAL  Final   Special Requests NONE  Final   Gram Stain    Final    MODERATE WBC PRESENT,BOTH PMN AND MONONUCLEAR NO ORGANISMS SEEN    Report Status 01/13/2015 FINAL  Final  MRSA PCR Screening     Status: None   Collection Time: 01/14/15 10:50 AM  Result Value Ref Range Status   MRSA by PCR NEGATIVE NEGATIVE Final    Comment:        The GeneXpert MRSA Assay (FDA approved for NASAL specimens only), is one component of a comprehensive MRSA colonization surveillance program. It is not intended to diagnose MRSA infection nor to guide or monitor treatment for MRSA infections.   Gram stain     Status: None   Collection Time: 01/15/15  3:36 PM  Result Value Ref Range Status   Specimen Description FLUID PLEURAL LEFT  Final   Special Requests NONE  Final   Gram Stain   Final    MODERATE WBC PRESENT,BOTH PMN AND MONONUCLEAR NO ORGANISMS SEEN    Report Status 01/15/2015 FINAL  Final    Studies/Results: Dg Chest 1 View  01/15/2015  CLINICAL DATA:  Status post left thoracentesis. EXAM: CHEST 1 VIEW COMPARISON:  01/14/2015, 01/11/2015 FINDINGS: Marked cardiac silhouette enlargement compatible with cardiomegaly and associated pericardial effusion by comparison chest CT from yesterday. Decrease in the left effusion following thoracentesis. Residual left pleural fluid evident. Negative for pneumothorax. Bibasilar atelectasis persist worse on the left. IMPRESSION: Decrease in left effusion following thoracentesis.  No pneumothorax. Otherwise stable exam. Electronically Signed   By: Jerilynn Mages.  Shick M.D.   On: 01/15/2015 16:16   Ct Angio Chest Pe W/cm &/or Wo Cm  01/14/2015  CLINICAL DATA:  Shortness of breath and hemoptysis. EXAM: CT ANGIOGRAPHY CHEST WITH CONTRAST TECHNIQUE: Multidetector CT imaging of the chest was performed using the standard protocol during bolus administration of intravenous contrast. Multiplanar CT image reconstructions and MIPs were obtained to evaluate the vascular anatomy. CONTRAST:  166m OMNIPAQUE IOHEXOL 350 MG/ML SOLN COMPARISON:   10/28/2014 FINDINGS: THORACIC INLET/BODY WALL: No acute abnormality. MEDIASTINUM: Cardiomegaly with marked biventricular enlargement. There is a small to moderate pericardial effusion which is near water density. Patient is status post pericardiocentesis, accounting for gas in the subxiphoid region. Detection of pericardial serosal enhancement is limited by contrast timing. Diffuse atherosclerosis, including the coronary arteries. No aortic dissection. No convincing acute pulmonary embolism with bolus dispersion and frequent motion limiting sensitivity. Right heart failure with intrahepatic vein reflux and distension. LUNG WINDOWS: Moderate layering left pleural effusion with  near complete left lower lobe collapse. Patchy airspace opacity in the right lung with nonspecific pattern. No interlobular septal thickening. No primary source of hemoptysis is visualized. UPPER ABDOMEN: No acute findings. OSSEOUS: No acute fracture.  No suspicious lytic or blastic lesions. Review of the MIP images confirms the above findings. IMPRESSION: 1. Motion limited study.  No convincing acute pulmonary embolism. 2. Small to moderate pericardial effusion. No CT before the recent pericardiocentesis for comparison. 3. Biatrial enlargement with changes of right heart failure. 4. Moderate layering left pleural effusion with near complete left lower lobe collapse. 5. Multi focal airspace disease in the right lung which could reflect pneumonia or patchy alveolar hemorrhage (given history of hemoptysis). Electronically Signed   By: Monte Fantasia M.D.   On: 01/14/2015 16:32      Assessment/Plan:  INTERVAL HISTORY:  01/15/15 transferred to [facility] for airborne   Principal Problem:   Pericardial effusion with cardiac tamponade Active Problems:   Atrial fibrillation ([place])   Dementia   CAP (community acquired pneumonia)   Prolonged Q-T interval on ECG   Acute respiratory failure (HCC)   Chronic anemia   Chronic systolic  (congestive) heart failure (HCC)   Acute encephalopathy   Atrial fibrillation with RVR (HCC)   Pericardial effusion without cardiac tamponade   Acute pericarditis    [name] is a 81 y.o. male  wth amission for dysnea hypoxia found to have large pericardial effusion, pleural effusions left lobe collpase and multifocal pathology in the lungs  #1 Multifocal opacities in the lung with pleural effusion, pericardial effusion:  --given the concern raised for TB we will obtain sputa x 3 for AFB culture (I have put in order for resp therapy to induce to ensure this happens --continue rx for CAP with rocephin and doxy for now  #2 Pericarditis with effusion that was bloody and with slightly high WBC. The wBC in the pleural fluid was not what one would expect to find with a pyogenic bacterial purulent pericarditis. It is c/w possible TB infection vs viral vs malignant (esp given bloody appearance) vs rheumatologic or other inflammatory cause  I have ordered add on  Of AFB stain and culture to pericardial fluid "" MTB PCR ""  I will check CMV, EBV, Coxsackie virus ESR, CRP, RF, ANA- negative   #3 Pleural effusion: they seem much more transudative in nature but prior to seeing the chemistries I did also add   AFB stain and culture MTB PCR from pleural fluid Adenosine Deaminase form fluid  #4 Dementia: this is more likely the cause for his failure to thrive but we should work him up for his pericarditis    LOS: 6 days   Alcide Evener 01/16/2015, 9:42 AM

## 2015-01-16 NOTE — Progress Notes (Addendum)
[facility] PROGRESS NOTE    Progress Note   [name]:[id number] DOB: [birth year] DOA: 01/10/2015 PCP: [name], NP   Brief Narrative:   [name] is an 81 y.o. male male with past medical history of chronic atrial fibrillation not on anticoagulation secondary to GI bleed brought to the hospital because of increase shortness of breath and confusion.   Assessment/Plan:  Acute respiratory failure with hypoxia likely due to large pericardial effusion:/Moderate-sized left pleural effusion /Multifocal opacities in the lung Afebrile, continue empirically on IV Rocephin and doxycycline for possible community-acquired pneumonia versus aspiration pneumonia. Suspected parapneumonic left-sided effusion with left lower lobe collapse on CT, s/p  Thoracentesis  sputum x 3 for AFB culture  Repeat 2-D echo showed an EF of 45-50% with small to moderate pericardial effusion Hypoxemia improving currently requiring 2 L   Pericarditis/pericardial effusion that was bloody and with slightly high WBC, without tamponade  Patient status post pericardiocentesis  , 10/24 Doubt  pyogenic bacterial purulent pericarditis ID consult to make further recommendations about possible infectious pericarditis?TB infection vs viral vs malignant  Follow pleural/ pericardial fluid culture  Sepsis: Probable Cap/parapneumonic effusion? Unclear etiology of source, may be respiratory versus urinary. Resolved Continue IV Rocephin,/doxycycline day #6  Chronic  Atrial fibrillation [facility]): Now with A. fib with RVR,  Continue metoprolol IV for heart rate greater than 110.  Chronic systolic heart failure: Currently compensated. Lasix on hold , continue metoprolol. Repeat 2-D echo with results as above  Acute encephalopathy with underlying diagnosis of dementia: On Namenda and Aricept likely due to infectious etiology.  Prolonged QT on EKG: Magnesium and potassium are within normal. QTC mildly  improved from 580 -564.  Atrial fibrillation with RVR (HCC) Rate controlled for the most part some RVR likely from effusion. Cardiology increase metoprolol to 25 QID .. BP controlled. Not on anticoagulation due to hx GIB.Chronic Afib rate increased secondary to pericardial effusion, now improving  Dysphagia-speech therapy recommends   Dysphagia 3 (mechanical soft);Thin liquid  DVT Prophylaxis - Lovenox ordered.  Family Communication:  [phone], discussed with son chris  Disposition Plan: PT recommends home health on 10/26.  Code Status:     Code Status Orders        Start     Ordered   01/10/15 7846  Full code   Continuous     01/10/15 1715        IV Access:    Peripheral IV   Procedures and diagnostic studies:   Dg Chest 1 View  01/15/2015  CLINICAL DATA:  Status post left thoracentesis. EXAM: CHEST 1 VIEW COMPARISON:  01/14/2015, 01/11/2015 FINDINGS: Marked cardiac silhouette enlargement compatible with cardiomegaly and associated pericardial effusion by comparison chest CT from yesterday. Decrease in the left effusion following thoracentesis. Residual left pleural fluid evident. Negative for pneumothorax. Bibasilar atelectasis persist worse on the left. IMPRESSION: Decrease in left effusion following thoracentesis.  No pneumothorax. Otherwise stable exam. Electronically Signed   By: Jerilynn Mages.  Shick M.D.   On: 01/15/2015 16:16   Ct Angio Chest Pe W/cm &/or Wo Cm  01/14/2015  CLINICAL DATA:  Shortness of breath and hemoptysis. EXAM: CT ANGIOGRAPHY CHEST WITH CONTRAST TECHNIQUE: Multidetector CT imaging of the chest was performed using the standard protocol during bolus administration of intravenous contrast. Multiplanar CT image reconstructions and MIPs were obtained to evaluate the vascular anatomy. CONTRAST:  115mL OMNIPAQUE IOHEXOL 350 MG/ML SOLN COMPARISON:  10/28/2014 FINDINGS: THORACIC INLET/BODY WALL: No acute abnormality. MEDIASTINUM: Cardiomegaly with marked  biventricular enlargement. There is a small to moderate pericardial effusion which is near water density. Patient is status post pericardiocentesis, accounting for gas in the subxiphoid region. Detection of pericardial serosal enhancement is limited by contrast timing. Diffuse atherosclerosis, including the coronary arteries. No aortic dissection. No convincing acute pulmonary embolism with bolus dispersion and frequent motion limiting sensitivity. Right heart failure with intrahepatic vein reflux and distension. LUNG WINDOWS: Moderate layering left pleural effusion with near complete left lower lobe collapse. Patchy airspace opacity in the right lung with nonspecific pattern. No interlobular septal thickening. No primary source of hemoptysis is visualized. UPPER ABDOMEN: No acute findings. OSSEOUS: No acute fracture.  No suspicious lytic or blastic lesions. Review of the MIP images confirms the above findings. IMPRESSION: 1. Motion limited study.  No convincing acute pulmonary embolism. 2. Small to moderate pericardial effusion. No CT before the recent pericardiocentesis for comparison. 3. Biatrial enlargement with changes of right heart failure. 4. Moderate layering left pleural effusion with near complete left lower lobe collapse. 5. Multi focal airspace disease in the right lung which could reflect pneumonia or patchy alveolar hemorrhage (given history of hemoptysis). Electronically Signed   By: Monte Fantasia M.D.   On: 01/14/2015 16:32   US Thoracentesis Asp Pleural Space W/img Guide  01/16/2015  INDICATION: Symptomatic left sided pleural effusion EXAM: US THORACENTESIS ASP PLEURAL SPACE W/IMG GUIDE COMPARISON:  CTA chest 01/14/2015. MEDICATIONS: None COMPLICATIONS: None immediate TECHNIQUE: Informed written consent was obtained from the patient's son after a discussion of the risks, benefits and alternatives to treatment. A timeout was performed prior to the initiation of the procedure. Initial  ultrasound scanning demonstrates a left pleural effusion. The lower chest was prepped and draped in the usual sterile fashion. 1% lidocaine was used for local anesthesia. Under direct ultrasound guidance, a 19 gauge, 7-cm, Yueh catheter was introduced. An ultrasound image was saved for documentation purposes. The thoracentesis was performed. The catheter was removed and a dressing was applied. The patient tolerated the procedure well without immediate post procedural complication. The patient was escorted to have an upright chest radiograph. FINDINGS: A total of approximately 1.1 liters of serous fluid was removed. Requested samples were sent to the laboratory. IMPRESSION: Successful ultrasound-guided left sided thoracentesis yielding 1.1 Liters of pleural fluid. Read By:  Tsosie Billing PA-C Electronically Signed   By: Jacqulynn Cadet M.D.   On: 01/15/2015 16:38     Medical Consultants:    None.  Anti-Infectives:   Anti-infectives    Start     Dose/Rate Route Frequency Ordered Stop   01/10/15 2200  doxycycline (VIBRA-TABS) tablet 100 mg     100 mg Oral Every 12 hours 01/10/15 1715 01/17/15 2159   01/10/15 1730  cefTRIAXone (ROCEPHIN) 1 g in dextrose 5 % 50 mL IVPB     1 g 100 mL/hr over 30 Minutes Intravenous Every 24 hours 01/10/15 1715 01/17/15 1729   01/10/15 1415  levofloxacin (LEVAQUIN) IVPB 750 mg     750 mg 100 mL/hr over 90 Minutes Intravenous  Once 01/10/15 1402 01/10/15 1604      Subjective:    [name] patient denies any chest pain or shortness of breath, resting comfortably   Objective:    Filed Vitals:   01/16/15 0800 01/16/15 0836 01/16/15 1116 01/16/15 1146  BP: 106/47  100/57 100/57  Pulse: 60 68 86 73  Temp: 97.9 F (36.6 C) 97.9 F (36.6 C) 97.8 F (36.6 C)   TempSrc: Axillary Axillary Oral  Resp: 18 16 21 20   Height:      Weight:      SpO2: 93%   96%    Intake/Output Summary (Last 24 hours) at 01/16/15 1215 Last data filed at 01/15/15 2100   Gross per 24 hour  Intake    170 ml  Output    150 ml  Net     20 ml   Filed Weights   01/10/15 1657 01/15/15 1901  Weight: 98.2 kg (216 lb 7.9 oz) 92.6 kg (204 lb 2.3 oz)    Exam: Gen:  NAD Cardiovascular:  Irregular rate and rhythm Chest and lungs:   Good air movement with crackles on the right. No wheezing rails. Abdomen:  Abdomen soft, NT/ND, + BS Extremities:  No C/E/C   Data Reviewed:    Labs: Basic Metabolic Panel:  Recent Labs Lab 01/10/15 1844 01/12/15 1035 01/13/15 0239 01/14/15 0235 01/15/15 0236 01/16/15 0325  NA  --  138 141 143 143 139  K  --  4.2 4.3 4.1 4.1 3.7  CL  --  98* 103 104 103 103  CO2  --  31 31 29 29 31   GLUCOSE  --  142* 128* 149* 122* 98  BUN  --  15 16 17 13 11   CREATININE  --  0.90 0.68 0.68 0.63 0.64  CALCIUM  --  8.7* 8.8* 8.8* 8.7* 8.2*  MG 2.1  --   --   --   --   --    GFR Estimated Creatinine Clearance: 80.7 mL/min (by C-G formula based on Cr of 0.64). Liver Function Tests:  Recent Labs Lab 01/10/15 1235 01/15/15 0236 01/16/15 0325  AST 25 43* 32  ALT 20 35 32  ALKPHOS 170* 138* 140*  BILITOT 0.5 0.4 0.4  PROT 6.9 5.3* 4.9*  ALBUMIN 3.0* 2.3* 2.1*   No results for input(s): LIPASE, AMYLASE in the last 168 hours.  Recent Labs Lab 01/10/15 1714  AMMONIA 10   Coagulation profile  Recent Labs Lab 01/12/15 1035  INR 1.46    CBC:  Recent Labs Lab 01/09/15 1422 01/10/15 1235 01/12/15 1035 01/15/15 0236 01/16/15 0325  WBC 7.0 7.9 7.5 5.8 4.7  NEUTROABS 5.4 6.8  --   --   --   HGB 10.8* 11.5* 10.8* 10.4* 10.1*  HCT 33.7* 36.8* 35.6* 34.1* 33.3*  MCV 81.2 83.6 83.2 83.6 83.5  PLT 281 238 247 246 240   Cardiac Enzymes: No results for input(s): CKTOTAL, CKMB, CKMBINDEX, TROPONINI in the last 168 hours. BNP (last 3 results) No results for input(s): PROBNP in the last 8760 hours. CBG:  Recent Labs Lab 01/12/15 0819  GLUCAP 131*   D-Dimer: No results for input(s): DDIMER in the last 72  hours. Hgb A1c: No results for input(s): HGBA1C in the last 72 hours. Lipid Profile: No results for input(s): CHOL, HDL, LDLCALC, TRIG, CHOLHDL, LDLDIRECT in the last 72 hours. Thyroid function studies:  Recent Labs  01/15/15 1123  TSH 2.449   Anemia work up: No results for input(s): VITAMINB12, FOLATE, FERRITIN, TIBC, IRON, RETICCTPCT in the last 72 hours. Sepsis Labs:  Recent Labs Lab 01/10/15 1234 01/10/15 1235 01/12/15 1035 01/15/15 0236 01/16/15 0325  WBC  --  7.9 7.5 5.8 4.7  LATICACIDVEN 1.22  --   --   --   --    Microbiology Recent Results (from the past 240 hour(s))  Culture, Urine     Status: None   Collection Time: 01/10/15  2:24 PM  Result Value Ref Range Status   Specimen Description URINE, RANDOM  Final   Special Requests NONE  Final   Culture MULTIPLE SPECIES PRESENT, SUGGEST RECOLLECTION  Final   Report Status 01/12/2015 FINAL  Final  Culture, body fluid-bottle     Status: None (Preliminary result)   Collection Time: 01/13/15 11:44 AM  Result Value Ref Range Status   Specimen Description FLUID PERITONEAL  Final   Special Requests BAA 10MLS  Final   Culture NO GROWTH 3 DAYS  Final   Report Status PENDING  Incomplete  Gram stain     Status: None   Collection Time: 01/13/15 11:44 AM  Result Value Ref Range Status   Specimen Description FLUID PERITONEAL  Final   Special Requests NONE  Final   Gram Stain   Final    MODERATE WBC PRESENT,BOTH PMN AND MONONUCLEAR NO ORGANISMS SEEN    Report Status 01/13/2015 FINAL  Final  MRSA PCR Screening     Status: None   Collection Time: 01/14/15 10:50 AM  Result Value Ref Range Status   MRSA by PCR NEGATIVE NEGATIVE Final    Comment:        The GeneXpert MRSA Assay (FDA approved for NASAL specimens only), is one component of a comprehensive MRSA colonization surveillance program. It is not intended to diagnose MRSA infection nor to guide or monitor treatment for MRSA infections.   Culture, body  fluid-bottle     Status: None (Preliminary result)   Collection Time: 01/15/15  3:36 PM  Result Value Ref Range Status   Specimen Description FLUID PLEURAL LEFT  Final   Special Requests NONE  Final   Culture NO GROWTH < 24 HOURS  Final   Report Status PENDING  Incomplete  Gram stain     Status: None   Collection Time: 01/15/15  3:36 PM  Result Value Ref Range Status   Specimen Description FLUID PLEURAL LEFT  Final   Special Requests NONE  Final   Gram Stain   Final    MODERATE WBC PRESENT,BOTH PMN AND MONONUCLEAR NO ORGANISMS SEEN    Report Status 01/15/2015 FINAL  Final     Medications:   . antiseptic oral rinse  7 mL Mouth Rinse BID  . aspirin  81 mg Oral Daily  . cefTRIAXone (ROCEPHIN)  IV  1 g Intravenous Q24H  . darifenacin  7.5 mg Oral Daily  . donepezil  10 mg Oral QHS  . doxycycline  100 mg Oral Q12H  . enoxaparin (LOVENOX) injection  40 mg Subcutaneous Q24H  . gabapentin  100 mg Oral QHS  . hydrOXYzine  5 mg Oral BID  . memantine  28 mg Oral Daily  . metoprolol tartrate  25 mg Oral QID  . sodium chloride  250 mL Intravenous Once  . sodium chloride  3 mL Intravenous Q12H  . sodium chloride  3 mL Intravenous Q12H  . sodium chloride HYPERTONIC  4 mL Nebulization 3 times per day   Continuous Infusions:    Time spent: 25 min   LOS: 6 days   [facility]  [facility] Pager [phone]  *Please refer to [email or web address], password [email or web address] to get updated schedule on who will round on this patient, as hospitalists switch teams weekly. If 7PM-7AM, please contact night-coverage at [email or web address], password [email or web address] for any overnight needs.  01/16/2015, 12:15 PM

## 2015-01-16 NOTE — Progress Notes (Signed)
Hypertonic saline nebulizer given to patient for sputum induction. He still has a dry cough and was not able to produce any sputum culture.

## 2015-01-17 LAB — EPSTEIN-BARR VIRUS VCA ANTIBODY PANEL
EBV Early Antigen Ab, IgG: <9 U/mL (ref 0.0–8.9)
EBV NA IGG: 72.3 U/mL — AB (ref 0.0–17.9)
EBV VCA IgG: >600 U/mL — ABNORMAL HIGH (ref 0.0–17.9)

## 2015-01-17 LAB — LEGIONELLA PNEUMOPHILA SEROGP 1 UR AG: L. PNEUMOPHILA SEROGP 1 UR AG: NEGATIVE

## 2015-01-17 LAB — CMV IGM

## 2015-01-17 LAB — MISC LABCORP TEST (SEND OUT): LABCORP TEST CODE: 247282

## 2015-01-17 LAB — CMV ANTIBODY, IGG (EIA)

## 2015-01-17 LAB — RHEUMATOID FACTOR

## 2015-01-17 MED ORDER — FUROSEMIDE 40 MG PO TABS
40.0000 mg | ORAL_TABLET | Freq: Every day | ORAL | Status: DC
  Administered 2015-01-17 – 2015-01-18 (×2): 40 mg via ORAL
  Filled 2015-01-17 (×3): qty 1

## 2015-01-17 NOTE — Care Management Important Message (Signed)
Important Message  Patient Details  Name: [name] MRN: [id number] Date of Birth: April 12, 1927   Medicare Important Message Given:  Yes-third notification given    Girard Cooter, RN 01/17/2015, 2:10 PM

## 2015-01-17 NOTE — Progress Notes (Signed)
   01/17/15 0603  Aerosol Therapy Tx  Medications Other (Comment) (Hypertonic Saline)  Delivery Source Oxygen  Delivery Device HHN  Pre-Treatment Pulse 68  Pre-Treatment Respirations 16  Post-Treatment Pulse 75  Post-Treatment Respirations 18  Treatment Tolerance Tolerated well  Treatment Given 1  RT Breath Sounds  Bilateral Breath Sounds Clear;Diminished  Oxygen Therapy/Pulse Ox  O2 Device Nasal Cannula  O2 Therapy Oxygen  O2 Flow Rate (L/min) 2 L/min  No sputum sample obtained

## 2015-01-17 NOTE — Progress Notes (Addendum)
Physical Therapy Treatment Patient Details Name: [name] MRN: [id number] DOB: [birth year] Today's Date: 01/17/2015    History of Present Illness pt is an 81 y/o male with PMHx of chronic afib, dm, lumbar disc ds and dementia admitted to ED with increased SOB and resp rate.  pt found to have sepsis from resp vs urinary source and resp failure likely due to large pericardial effusion, s/p pericardiocentesis 10/24.    PT Comments    Pt admitted with above diagnosis. Pt currently with functional limitations due to balance and endurance deficits. Pt ambulated with RW with cues.  Pt needed cues for safety with RW as he has poor postural awareness.   As long as pt has 24 hour care, can go home with HHPT.  If not, would benefit from short term SNF.  Pt will benefit from skilled PT to increase their independence and safety with mobility to allow discharge to the venue listed below.    Follow Up Recommendations  Home health PT;Supervision/Assistance - 24 hour     Equipment Recommendations  None recommended by PT    Recommendations for Other Services       Precautions / Restrictions Precautions Precautions: Fall Restrictions Weight Bearing Restrictions: No    Mobility  Bed Mobility Overal bed mobility: Needs Assistance Bed Mobility: Supine to Sit;Sit to Supine     Supine to sit: Min assist     General bed mobility comments: min truncal assist and cues for redirection  Transfers Overall transfer level: Needs assistance Equipment used: Rolling walker (2 wheeled) Transfers: Sit to/from Stand Sit to Stand: Mod assist;+2 physical assistance         General transfer comment: assist to come forward, pt with moderate list posteriorly  Ambulation/Gait Ambulation/Gait assistance: Min assist;+2 physical assistance Ambulation Distance (Feet): 65 Feet Assistive device: Rolling walker (2 wheeled) Gait Pattern/deviations: Step-through pattern;Decreased stride length;Trunk  flexed;Wide base of support;Staggering left;Staggering right;Drifts right/left;Leaning posteriorly   Gait velocity interpretation: Below normal speed for age/gender General Gait Details: Pt was able to ambulate with RW with cues to stay close to RW.  Pt also needed assist to guide RW.  Pt also needed cues for posture.Pt ambulated to bathroom and had BM and urinated.  Needed total assit to clean pt.     Stairs            Wheelchair Mobility    Modified Rankin (Stroke Patients Only)       Balance Overall balance assessment: Needs assistance Sitting-balance support: No upper extremity supported;Feet supported Sitting balance-Leahy Scale: Fair     Standing balance support: Bilateral upper extremity supported;During functional activity Standing balance-Leahy Scale: Poor Standing balance comment: reliant on bil UE support                    Cognition Arousal/Alertness: Awake/alert Behavior During Therapy: Restless;WFL for tasks assessed/performed Overall Cognitive Status: History of cognitive impairments - at baseline                      Exercises      General Comments        Pertinent Vitals/Pain Pain Assessment: No/denies pain  VSS with sats 88-92% on RA with activity.      Home Living                      Prior Function            PT Goals (current goals can now  be found in the care plan section)      Frequency  Min 3X/week    PT Plan Current plan remains appropriate    Co-evaluation             End of Session Equipment Utilized During Treatment: Gait belt Activity Tolerance: Patient tolerated treatment well Patient left: in chair;with call bell/phone within reach;with chair alarm set     Time: [phone] PT Time Calculation (min) (ACUTE ONLY): 27 min  Charges:  $Gait Training: 8-22 mins $Self Care/Home Management: 8-22                    G CodesDenice Paradise 2015/01/27, 12:47 PM Jaevian Shean,PT Acute  Rehabilitation [phone] (pager)

## 2015-01-17 NOTE — Progress Notes (Signed)
Afib - Rates controlled Pericardial effusion - Work up in progress.  Note ID input  Probably slow to develop  Would recomm follow up echo in 1 month to evaluate for reaccummulation.  Will be available as needed  Please call    Dorris Carnes

## 2015-01-17 NOTE — Progress Notes (Signed)
[facility]    Subjective: No new complaints says he feels fine  Antibiotics:  Anti-infectives    Start     Dose/Rate Route Frequency Ordered Stop   01/10/15 2200  doxycycline (VIBRA-TABS) tablet 100 mg     100 mg Oral Every 12 hours 01/10/15 1715 01/17/15 2159   01/10/15 1730  cefTRIAXone (ROCEPHIN) 1 g in dextrose 5 % 50 mL IVPB     1 g 100 mL/hr over 30 Minutes Intravenous Every 24 hours 01/10/15 1715 01/16/15 1919   01/10/15 1415  levofloxacin (LEVAQUIN) IVPB 750 mg     750 mg 100 mL/hr over 90 Minutes Intravenous  Once 01/10/15 1402 01/10/15 1604      Medications: Scheduled Meds: . antiseptic oral rinse  7 mL Mouth Rinse BID  . aspirin  81 mg Oral Daily  . darifenacin  7.5 mg Oral Daily  . donepezil  10 mg Oral QHS  . doxycycline  100 mg Oral Q12H  . enoxaparin (LOVENOX) injection  40 mg Subcutaneous Q24H  . furosemide  40 mg Oral Daily  . gabapentin  100 mg Oral QHS  . hydrOXYzine  5 mg Oral BID  . memantine  28 mg Oral Daily  . metoprolol tartrate  25 mg Oral QID  . sodium chloride  250 mL Intravenous Once  . sodium chloride  3 mL Intravenous Q12H   Continuous Infusions:  PRN Meds:.sodium chloride, metoprolol, nitroGLYCERIN, RESOURCE THICKENUP CLEAR, sodium chloride    Objective: Weight change:   Intake/Output Summary (Last 24 hours) at 01/17/15 1449 Last data filed at 01/17/15 1300  Gross per 24 hour  Intake   1113 ml  Output    300 ml  Net    813 ml   Blood pressure 114/48, pulse 100, temperature 97.8 F (36.6 C), temperature source Oral, resp. rate 18, height 6\' 3"  (1.905 m), weight 204 lb 2.3 oz (92.6 kg), SpO2 96 %. Temp:  [97.7 F (36.5 C)-98.8 F (37.1 C)] 97.8 F (36.6 C) (10/28 1140) Pulse Rate:  [60-100] 100 (10/28 1007) Resp:  [18-22] 18 (10/28 0700) BP: (105-123)/(42-67) 114/48 mmHg (10/28 1140) SpO2:  [90 %-100 %] 96 % (10/28 0700)  Physical Exam: General: Awake answers some  questions Cardiovascular: irr, iRR no murmurs or gallops Pulmonary: fairly clear to auscultation bilaterally, anteriorly Gastrointestinal: soft  nondistended, normal bowel sounds, Musculoskeletal: edema Skin, soft tissue: no rashes Neuro: nonfocal  CBC: CBC Latest Ref Rng 01/16/2015 01/15/2015 01/12/2015  WBC 4.0 - 10.5 K/uL 4.7 5.8 7.5  Hemoglobin 13.0 - 17.0 g/dL 10.1(L) 10.4(L) 10.8(L)  Hematocrit 39.0 - 52.0 % 33.3(L) 34.1(L) 35.6(L)  Platelets 150 - 400 K/uL 240 246 247       BMET  Recent Labs  01/15/15 0236 01/16/15 0325  NA 143 139  K 4.1 3.7  CL 103 103  CO2 29 31  GLUCOSE 122* 98  BUN 13 11  CREATININE 0.63 0.64  CALCIUM 8.7* 8.2*     Liver Panel   Recent Labs  01/15/15 0236 01/16/15 0325  PROT 5.3* 4.9*  ALBUMIN 2.3* 2.1*  AST 43* 32  ALT 35 32  ALKPHOS 138* 140*  BILITOT 0.4 0.4       Sedimentation Rate  Recent Labs  01/16/15 0325  ESRSEDRATE 48*   C-Reactive Protein  Recent Labs  01/16/15 0325  CRP 4.0*    Micro Results: Recent Results (from the past 720 hour(s))  Culture, Urine     Status: None   Collection Time: 01/10/15  2:24 PM  Result Value Ref Range Status   Specimen Description URINE, RANDOM  Final   Special Requests NONE  Final   Culture MULTIPLE SPECIES PRESENT, SUGGEST RECOLLECTION  Final   Report Status 01/12/2015 FINAL  Final  Culture, body fluid-bottle     Status: None (Preliminary result)   Collection Time: 01/13/15 11:44 AM  Result Value Ref Range Status   Specimen Description FLUID PERITONEAL  Final   Special Requests BAA 10MLS  Final   Culture NO GROWTH 4 DAYS  Final   Report Status PENDING  Incomplete  Gram stain     Status: None   Collection Time: 01/13/15 11:44 AM  Result Value Ref Range Status   Specimen Description FLUID PERITONEAL  Final   Special Requests NONE  Final   Gram Stain   Final    MODERATE WBC PRESENT,BOTH PMN AND MONONUCLEAR NO ORGANISMS SEEN    Report Status 01/13/2015 FINAL   Final  MRSA PCR Screening     Status: None   Collection Time: 01/14/15 10:50 AM  Result Value Ref Range Status   MRSA by PCR NEGATIVE NEGATIVE Final    Comment:        The GeneXpert MRSA Assay (FDA approved for NASAL specimens only), is one component of a comprehensive MRSA colonization surveillance program. It is not intended to diagnose MRSA infection nor to guide or monitor treatment for MRSA infections.   Culture, body fluid-bottle     Status: None (Preliminary result)   Collection Time: 01/15/15  3:36 PM  Result Value Ref Range Status   Specimen Description FLUID PLEURAL LEFT  Final   Special Requests NONE  Final   Culture NO GROWTH 2 DAYS  Final   Report Status PENDING  Incomplete  Gram stain     Status: None   Collection Time: 01/15/15  3:36 PM  Result Value Ref Range Status   Specimen Description FLUID PLEURAL LEFT  Final   Special Requests NONE  Final   Gram Stain   Final    MODERATE WBC PRESENT,BOTH PMN AND MONONUCLEAR NO ORGANISMS SEEN    Report Status 01/15/2015 FINAL  Final  AFB culture with smear     Status: None (Preliminary result)   Collection Time: 01/15/15  3:36 PM  Result Value Ref Range Status   Specimen Description FLUID PLEURAL LEFT  Final   Special Requests NONE  Final   Acid Fast Smear   Final    NO ACID FAST BACILLI SEEN Performed at [facility]    Culture   Final    CULTURE WILL BE EXAMINED FOR 6 WEEKS BEFORE ISSUING A FINAL REPORT Performed at [facility]    Report Status PENDING  Incomplete    Studies/Results: Dg Chest 1 View  01/15/2015  CLINICAL DATA:  Status post left thoracentesis. EXAM: CHEST 1 VIEW COMPARISON:  01/14/2015, 01/11/2015 FINDINGS: Marked cardiac silhouette enlargement compatible with cardiomegaly and associated pericardial effusion by comparison chest CT from yesterday. Decrease in the left effusion following thoracentesis. Residual left pleural fluid evident. Negative for pneumothorax. Bibasilar  atelectasis persist worse on the left. IMPRESSION: Decrease in left effusion following thoracentesis.  No pneumothorax. Otherwise stable exam. Electronically Signed   By: Jerilynn Mages.  Shick M.D.   On: 01/15/2015 16:16   US Thoracentesis Asp Pleural Space W/img Guide  01/16/2015  INDICATION: Symptomatic left sided pleural effusion EXAM: US THORACENTESIS ASP PLEURAL SPACE  W/IMG GUIDE COMPARISON:  CTA chest 01/14/2015. MEDICATIONS: None COMPLICATIONS: None immediate TECHNIQUE: Informed written consent was obtained from the patient's son after a discussion of the risks, benefits and alternatives to treatment. A timeout was performed prior to the initiation of the procedure. Initial ultrasound scanning demonstrates a left pleural effusion. The lower chest was prepped and draped in the usual sterile fashion. 1% lidocaine was used for local anesthesia. Under direct ultrasound guidance, a 19 gauge, 7-cm, Yueh catheter was introduced. An ultrasound image was saved for documentation purposes. The thoracentesis was performed. The catheter was removed and a dressing was applied. The patient tolerated the procedure well without immediate post procedural complication. The patient was escorted to have an upright chest radiograph. FINDINGS: A total of approximately 1.1 liters of serous fluid was removed. Requested samples were sent to the laboratory. IMPRESSION: Successful ultrasound-guided left sided thoracentesis yielding 1.1 Liters of pleural fluid. Read By:  Tsosie Billing PA-C Electronically Signed   By: Jacqulynn Cadet M.D.   On: 01/15/2015 16:38      Assessment/Plan:  INTERVAL HISTORY:  01/15/15 transferred to [facility] for airborne 01/16/15: resp therapy gave hypertonic saline x 3 but could not obtain an expectorated sputum after induction x 3  Principal Problem:   Pericardial effusion with cardiac tamponade Active Problems:   Atrial fibrillation (HCC)   Dementia   CAP (community acquired pneumonia)   Prolonged Q-T  interval on ECG   Acute respiratory failure (HCC)   Chronic anemia   Chronic systolic (congestive) heart failure (HCC)   Acute encephalopathy   Atrial fibrillation with RVR (HCC)   Pericardial effusion without cardiac tamponade   Acute pericarditis   S/P thoracentesis    [name] is a 81 y.o. male  wth admission for dysnea hypoxia found to have large pericardial effusion, pleural effusions left lobe collpase and multifocal pathology in the lungs  #1 Multifocal opacities in the lung with pleural effusion, pericardial effusion:  --given the concern raised for TB we attempted to obtain sputa x 3 for AFB culture  But patient could nto produce sputum --My suspicion for TB is not high enough for me to want to go to more aggressive measures such as asking pulmonary to perform bronchoscopies I think we have done due diligence to exclude active pulmonary TB and I will discontinue his airborne precautions  --continue rx for CAP with rocephin and doxy for now and stop after 5-7 days  #2 Pericarditis with effusion that was bloody and with slightly high WBC. The wBC in the pleural fluid was not what one would expect to find with a pyogenic bacterial purulent pericarditis. It is c/w possible TB infection vs viral vs malignant (esp given bloody appearance) vs rheumatologic or other inflammatory cause  I have ordered add on  Of AFB stain and culture to pericardial fluid "" MTB PCR ""  (these were cancelled but will now be added on)   EBV [ame; cw past infection, HIV negative,  Coxsackie virus pending   #3 Pleural effusion: they seem much more transudative in nature but prior to seeing the chemistries I did also add AFB stain and culture negative. MTB PCR from pleural fluid pendingAdenosine Deaminase form fluid  #4 Dementia: this is more likely the cause for his failure to thrive but we should work him up for his pericarditis  Dr. Johnnye Sima is covering over the weekend and available for  questions.  If his AFB pericardial fluid is negative, we will sign off and can  be re consulted as needed.   LOS: 7 days   Alcide Evener 01/17/2015, 2:49 PM

## 2015-01-17 NOTE — Progress Notes (Signed)
[facility] PROGRESS NOTE    Progress Note   BRELAND TROUTEN WGN:[id number] DOB: 1927/12/26 DOA: 01/10/2015 PCP: [name], NP   Brief Narrative:   [name] is an 81 y.o. male male with past medical history of chronic atrial fibrillation not on anticoagulation secondary to GI bleed brought to the hospital because of increase shortness of breath and confusion.   Assessment/Plan:  Acute respiratory failure with hypoxia likely due to large pericardial effusion:/Moderate-sized left pleural effusion /Multifocal opacities in the lung Workup for infectious disease for pericardial effusion/pleural effusion pending Continue IV Rocephin and doxycycline for possible community-acquired pneumonia versus aspiration, day #7 pneumonia.   left-sided effusion with left lower lobe collapse on CT, s/p  Thoracentesis 10/26 Gram stain negative, pericardial/pleural fluid culture no growth so far, AFB culture pending Repeat 2-D echo showed an EF of 45-50% with small to moderate pericardial effusion Hypoxemia improving currently requiring 2 L Patient needs repeat 2-D echo in one month for cardiology   Pericarditis/pericardial effusion that was bloody and with slightly high WBC, without tamponade  Patient status post pericardiocentesis  , 10/24 Doubt  pyogenic bacterial purulent pericarditis ID following, appreciate their recommendations, questionable infectious pericarditis?TB infection vs viral vs malignant  Follow pleural/ pericardial fluid culture 2-D echo in one month  Sepsis: Probable Cap/parapneumonic effusion? Unclear etiology of source, may be respiratory versus urinary. Resolved Continue IV Rocephin,/doxycycline day #7  Chronic systolic heart failure: Currently compensated. Restart Lasix 40 mg a day.   continue beta blocker. Repeat 2-D echo with results as above  Acute encephalopathy with underlying diagnosis of dementia: On Namenda and Aricept likely due to infectious  etiology.  Prolonged QT on EKG: Magnesium and potassium are within normal. QTC mildly improved from 580 -564.  Atrial fibrillation with RVR (HCC) Rate controlled for the most part some RVR likely from effusion. Cardiology increase metoprolol to 25 QID .. BP controlled. Not on anticoagulation due to hx GIB.Chronic Afib rate increased secondary to pericardial effusion, now improving  Dysphagia-speech therapy recommends   Dysphagia 3 (mechanical soft);Thin liquid  DVT Prophylaxis - Lovenox ordered.  Family Communication:  [phone], discussed with son chris  Disposition Plan: PT recommends home health on 10/26. Patient needs to be reevaluated for PT. DC isolation pending infectious disease recommendations.  Code Status:     Code Status Orders        Start     Ordered   01/10/15 8413  Full code   Continuous     01/10/15 1715        IV Access:    Peripheral IV   Procedures and diagnostic studies:   Dg Chest 1 View  01/15/2015  CLINICAL DATA:  Status post left thoracentesis. EXAM: CHEST 1 VIEW COMPARISON:  01/14/2015, 01/11/2015 FINDINGS: Marked cardiac silhouette enlargement compatible with cardiomegaly and associated pericardial effusion by comparison chest CT from yesterday. Decrease in the left effusion following thoracentesis. Residual left pleural fluid evident. Negative for pneumothorax. Bibasilar atelectasis persist worse on the left. IMPRESSION: Decrease in left effusion following thoracentesis.  No pneumothorax. Otherwise stable exam. Electronically Signed   By: Jerilynn Mages.  Shick M.D.   On: 01/15/2015 16:16   US Thoracentesis Asp Pleural Space W/img Guide  01/16/2015  INDICATION: Symptomatic left sided pleural effusion EXAM: US THORACENTESIS ASP PLEURAL SPACE W/IMG GUIDE COMPARISON:  CTA chest 01/14/2015. MEDICATIONS: None COMPLICATIONS: None immediate TECHNIQUE: Informed written consent was obtained from the patient's son after a discussion of the risks, benefits and  alternatives to treatment. A  timeout was performed prior to the initiation of the procedure. Initial ultrasound scanning demonstrates a left pleural effusion. The lower chest was prepped and draped in the usual sterile fashion. 1% lidocaine was used for local anesthesia. Under direct ultrasound guidance, a 19 gauge, 7-cm, Yueh catheter was introduced. An ultrasound image was saved for documentation purposes. The thoracentesis was performed. The catheter was removed and a dressing was applied. The patient tolerated the procedure well without immediate post procedural complication. The patient was escorted to have an upright chest radiograph. FINDINGS: A total of approximately 1.1 liters of serous fluid was removed. Requested samples were sent to the laboratory. IMPRESSION: Successful ultrasound-guided left sided thoracentesis yielding 1.1 Liters of pleural fluid. Read By:  Tsosie Billing PA-C Electronically Signed   By: Jacqulynn Cadet M.D.   On: 01/15/2015 16:38     Medical Consultants:    None.  Anti-Infectives:   Anti-infectives    Start     Dose/Rate Route Frequency Ordered Stop   01/10/15 2200  doxycycline (VIBRA-TABS) tablet 100 mg     100 mg Oral Every 12 hours 01/10/15 1715 01/17/15 2159   01/10/15 1730  cefTRIAXone (ROCEPHIN) 1 g in dextrose 5 % 50 mL IVPB     1 g 100 mL/hr over 30 Minutes Intravenous Every 24 hours 01/10/15 1715 01/16/15 1919   01/10/15 1415  levofloxacin (LEVAQUIN) IVPB 750 mg     750 mg 100 mL/hr over 90 Minutes Intravenous  Once 01/10/15 1402 01/10/15 1604      Subjective:    Eduardo Ala B Jonesvery weak and confused, resting comfortably but following commands  Objective:    Filed Vitals:   01/17/15 0200 01/17/15 0400 01/17/15 0700 01/17/15 1007  BP:  107/43 117/67 123/44  Pulse:   64 100  Temp:  98.3 F (36.8 C) 98.1 F (36.7 C)   TempSrc:  Axillary Oral   Resp:  18 18   Height:      Weight:      SpO2: 96% 95% 96%     Intake/Output Summary (Last  24 hours) at 01/17/15 1024 Last data filed at 01/17/15 1010  Gross per 24 hour  Intake    833 ml  Output    300 ml  Net    533 ml   Filed Weights   01/10/15 1657 01/15/15 1901  Weight: 98.2 kg (216 lb 7.9 oz) 92.6 kg (204 lb 2.3 oz)    Exam: Gen: Weak and confused  Cardiovascular:  Irregular rate and rhythm Chest and lungs:   Good air movement with crackles on the right. No wheezing rails. Abdomen:  Abdomen soft, NT/ND, + BS Extremities:  No C/E/C   Data Reviewed:    Labs: Basic Metabolic Panel:  Recent Labs Lab 01/10/15 1844 01/12/15 1035 01/13/15 0239 01/14/15 0235 01/15/15 0236 01/16/15 0325  NA  --  138 141 143 143 139  K  --  4.2 4.3 4.1 4.1 3.7  CL  --  98* 103 104 103 103  CO2  --  31 31 29 29 31   GLUCOSE  --  142* 128* 149* 122* 98  BUN  --  15 16 17 13 11   CREATININE  --  0.90 0.68 0.68 0.63 0.64  CALCIUM  --  8.7* 8.8* 8.8* 8.7* 8.2*  MG 2.1  --   --   --   --   --    GFR Estimated Creatinine Clearance: 80.7 mL/min (by C-G formula based on Cr of 0.64). Liver  Function Tests:  Recent Labs Lab 01/10/15 1235 01/15/15 0236 01/16/15 0325  AST 25 43* 32  ALT 20 35 32  ALKPHOS 170* 138* 140*  BILITOT 0.5 0.4 0.4  PROT 6.9 5.3* 4.9*  ALBUMIN 3.0* 2.3* 2.1*   No results for input(s): LIPASE, AMYLASE in the last 168 hours.  Recent Labs Lab 01/10/15 1714  AMMONIA 10   Coagulation profile  Recent Labs Lab 01/12/15 1035  INR 1.46    CBC:  Recent Labs Lab 01/10/15 1235 01/12/15 1035 01/15/15 0236 01/16/15 0325  WBC 7.9 7.5 5.8 4.7  NEUTROABS 6.8  --   --   --   HGB 11.5* 10.8* 10.4* 10.1*  HCT 36.8* 35.6* 34.1* 33.3*  MCV 83.6 83.2 83.6 83.5  PLT 238 247 246 240   Cardiac Enzymes: No results for input(s): CKTOTAL, CKMB, CKMBINDEX, TROPONINI in the last 168 hours. BNP (last 3 results) No results for input(s): PROBNP in the last 8760 hours. CBG:  Recent Labs Lab 01/12/15 0819  GLUCAP 131*   D-Dimer: No results for  input(s): DDIMER in the last 72 hours. Hgb A1c: No results for input(s): HGBA1C in the last 72 hours. Lipid Profile: No results for input(s): CHOL, HDL, LDLCALC, TRIG, CHOLHDL, LDLDIRECT in the last 72 hours. Thyroid function studies:  Recent Labs  01/15/15 1123  TSH 2.449   Anemia work up: No results for input(s): VITAMINB12, FOLATE, FERRITIN, TIBC, IRON, RETICCTPCT in the last 72 hours. Sepsis Labs:  Recent Labs Lab 01/10/15 1234 01/10/15 1235 01/12/15 1035 01/15/15 0236 01/16/15 0325  WBC  --  7.9 7.5 5.8 4.7  LATICACIDVEN 1.22  --   --   --   --    Microbiology Recent Results (from the past 240 hour(s))  Culture, Urine     Status: None   Collection Time: 01/10/15  2:24 PM  Result Value Ref Range Status   Specimen Description URINE, RANDOM  Final   Special Requests NONE  Final   Culture MULTIPLE SPECIES PRESENT, SUGGEST RECOLLECTION  Final   Report Status 01/12/2015 FINAL  Final  Culture, body fluid-bottle     Status: None (Preliminary result)   Collection Time: 01/13/15 11:44 AM  Result Value Ref Range Status   Specimen Description FLUID PERITONEAL  Final   Special Requests BAA 10MLS  Final   Culture NO GROWTH 3 DAYS  Final   Report Status PENDING  Incomplete  Gram stain     Status: None   Collection Time: 01/13/15 11:44 AM  Result Value Ref Range Status   Specimen Description FLUID PERITONEAL  Final   Special Requests NONE  Final   Gram Stain   Final    MODERATE WBC PRESENT,BOTH PMN AND MONONUCLEAR NO ORGANISMS SEEN    Report Status 01/13/2015 FINAL  Final  MRSA PCR Screening     Status: None   Collection Time: 01/14/15 10:50 AM  Result Value Ref Range Status   MRSA by PCR NEGATIVE NEGATIVE Final    Comment:        The GeneXpert MRSA Assay (FDA approved for NASAL specimens only), is one component of a comprehensive MRSA colonization surveillance program. It is not intended to diagnose MRSA infection nor to guide or monitor treatment for MRSA  infections.   Culture, body fluid-bottle     Status: None (Preliminary result)   Collection Time: 01/15/15  3:36 PM  Result Value Ref Range Status   Specimen Description FLUID PLEURAL LEFT  Final   Special Requests NONE  Final   Culture NO GROWTH < 24 HOURS  Final   Report Status PENDING  Incomplete  Gram stain     Status: None   Collection Time: 01/15/15  3:36 PM  Result Value Ref Range Status   Specimen Description FLUID PLEURAL LEFT  Final   Special Requests NONE  Final   Gram Stain   Final    MODERATE WBC PRESENT,BOTH PMN AND MONONUCLEAR NO ORGANISMS SEEN    Report Status 01/15/2015 FINAL  Final  AFB culture with smear     Status: None (Preliminary result)   Collection Time: 01/15/15  3:36 PM  Result Value Ref Range Status   Specimen Description FLUID PLEURAL LEFT  Final   Special Requests NONE  Final   Acid Fast Smear   Final    NO ACID FAST BACILLI SEEN Performed at [facility]    Culture   Final    CULTURE WILL BE EXAMINED FOR 6 WEEKS BEFORE ISSUING A FINAL REPORT Performed at [facility]    Report Status PENDING  Incomplete     Medications:   . antiseptic oral rinse  7 mL Mouth Rinse BID  . aspirin  81 mg Oral Daily  . darifenacin  7.5 mg Oral Daily  . donepezil  10 mg Oral QHS  . doxycycline  100 mg Oral Q12H  . enoxaparin (LOVENOX) injection  40 mg Subcutaneous Q24H  . gabapentin  100 mg Oral QHS  . hydrOXYzine  5 mg Oral BID  . memantine  28 mg Oral Daily  . metoprolol tartrate  25 mg Oral QID  . sodium chloride  250 mL Intravenous Once  . sodium chloride  3 mL Intravenous Q12H  . sodium chloride  3 mL Intravenous Q12H   Continuous Infusions:    Time spent: 25 min   LOS: 7 days   [facility]  [facility] Pager [phone]  *Please refer to [email or web address], password [email or web address] to get updated schedule on who will round on this patient, as hospitalists switch teams weekly. If 7PM-7AM, please contact night-coverage at [email or web address],  password [email or web address] for any overnight needs.  01/17/2015, 10:24 AM

## 2015-01-18 ENCOUNTER — Inpatient Hospital Stay (HOSPITAL_COMMUNITY): Payer: Medicare Other

## 2015-01-18 LAB — CULTURE, BODY FLUID W GRAM STAIN -BOTTLE: Culture: NO GROWTH

## 2015-01-18 LAB — CBC
HCT: 32.8 % — ABNORMAL LOW (ref 39.0–52.0)
Hemoglobin: 10.4 g/dL — ABNORMAL LOW (ref 13.0–17.0)
MCH: 26.1 pg (ref 26.0–34.0)
MCHC: 31.7 g/dL (ref 30.0–36.0)
MCV: 82.2 fL (ref 78.0–100.0)
PLATELETS: 248 10*3/uL (ref 150–400)
RBC: 3.99 MIL/uL — AB (ref 4.22–5.81)
RDW: 16.2 % — AB (ref 11.5–15.5)
WBC: 4.7 10*3/uL (ref 4.0–10.5)

## 2015-01-18 LAB — COMPREHENSIVE METABOLIC PANEL
ALBUMIN: 2.1 g/dL — AB (ref 3.5–5.0)
ALK PHOS: 134 U/L — AB (ref 38–126)
ALT: 30 U/L (ref 17–63)
ANION GAP: 5 (ref 5–15)
AST: 31 U/L (ref 15–41)
BILIRUBIN TOTAL: 0.4 mg/dL (ref 0.3–1.2)
BUN: 11 mg/dL (ref 6–20)
CALCIUM: 8.1 mg/dL — AB (ref 8.9–10.3)
CO2: 31 mmol/L (ref 22–32)
CREATININE: 0.63 mg/dL (ref 0.61–1.24)
Chloride: 102 mmol/L (ref 101–111)
GFR calc Af Amer: >60 mL/min (ref >60)
GFR calc non Af Amer: >60 mL/min (ref >60)
Glucose, Bld: 91 mg/dL (ref 65–99)
Potassium: 3.9 mmol/L (ref 3.5–5.1)
SODIUM: 138 mmol/L (ref 135–145)
Total Protein: 4.8 g/dL — ABNORMAL LOW (ref 6.5–8.1)

## 2015-01-18 LAB — BASIC METABOLIC PANEL
Anion gap: 8 (ref 5–15)
BUN: 11 mg/dL (ref 6–20)
CALCIUM: 8.7 mg/dL — AB (ref 8.9–10.3)
CO2: 31 mmol/L (ref 22–32)
CREATININE: 0.74 mg/dL (ref 0.61–1.24)
Chloride: 103 mmol/L (ref 101–111)
GFR calc Af Amer: >60 mL/min (ref >60)
GFR calc non Af Amer: >60 mL/min (ref >60)
Glucose, Bld: 121 mg/dL — ABNORMAL HIGH (ref 65–99)
Potassium: 3.9 mmol/L (ref 3.5–5.1)
SODIUM: 142 mmol/L (ref 135–145)

## 2015-01-18 LAB — COXSACKIE B VIRUS ANTIBODIES
Coxsackie B2 Ab: 1:8 {titer} — ABNORMAL HIGH
Coxsackie B3 Ab: 1:8 {titer} — ABNORMAL HIGH
Coxsackie B4 Ab: 1:8 {titer} — ABNORMAL HIGH
Coxsackie B5 Ab: 1:8 {titer} — ABNORMAL HIGH
Coxsackie B6 Ab: 1:8 {titer} — ABNORMAL HIGH

## 2015-01-18 LAB — ADENOSIDE DEAMINASE, PLEURAL FL: ADENOSIDE DEAMINASE, PLEURAL FL: 2 U/L (ref 0.0–9.4)

## 2015-01-18 LAB — CULTURE, BODY FLUID-BOTTLE

## 2015-01-18 NOTE — Progress Notes (Addendum)
[facility] PROGRESS NOTE    Progress Note   [name] RAQ:[id number] DOB: [birth year] DOA: 01/10/2015 PCP: [name], NP   Brief Narrative:   [name] is an 81 y.o. male male with past medical history of chronic atrial fibrillation not on anticoagulation secondary to GI bleed brought to the hospital because of increase shortness of breath and confusion.   Assessment/Plan:  Acute respiratory failure with hypoxia likely due to large pericardial effusion:/Moderate-sized left pleural effusion /Multifocal opacities in the lung, improving Workup per infectious disease for pericardial effusion/pleural effusion pending Currently it appears to be transudative in nature, doubt infectious etiology rx with IV Rocephin and doxycycline for possible community-acquired pneumonia versus aspiration, rx for 7 days now . Discontinued antibiotics 10/28 Repeat chest x-ray, to ensure stability. Patient was found to have left pleural effusion with  left lower lobe collapse on CT, s/p  Thoracentesis 10/26 Gram stain negative, pericardial/pleural fluid culture no growth so far, AFB culture pending Repeat 2-D echo showed an EF of 45-50% with small to moderate pericardial effusion Hypoxemia improving currently requiring 2 L   Pericarditis/pericardial effusion that was bloody and with slightly high WBC, without tamponade  Patient status post pericardiocentesis  , 10/24 Doubt  pyogenic bacterial purulent pericarditis ID following, appreciate their recommendations, questionable infectious pericarditis?TB infection vs viral vs malignant  Follow pleural/ pericardial fluid culture, Appreciate further input from infectious disease  2-D echo in one month airborne precautions has been DC'd, patient will be transferred to telemetry  Sepsis: Probable Cap/parapneumonic effusion? Unclear etiology of source, may be respiratory versus urinary. Resolved Continue IV Rocephin,/doxycycline day #7, DC'd   antibiotics  Chronic systolic heart failure: Currently compensated.Continue  Lasix 40 mg a day.   continue beta blocker. Repeat 2-D echo with results as above  Acute encephalopathy with underlying diagnosis of dementia: On Namenda and Aricept likely due to infectious etiology.  Prolonged QT on EKG: Magnesium and potassium are within normal. QTC mildly improved from 580 -564.  Atrial fibrillation with RVR (HCC) Rate controlled for the most part some RVR likely from effusion. Cardiology increase metoprolol to 25 QID .. BP controlled. Not on anticoagulation due to hx GIB.Chronic Afib rate increased secondary to pericardial effusion, now improving  Dysphagia-speech therapy recommends   Dysphagia 3 (mechanical soft);Thin liquid  DVT Prophylaxis - Lovenox ordered.  Family Communication:  [phone], discussed with son chris  Disposition Plan:Transfer patient to telemetry, needs home health, patient will discharge when okay with infectious disease .  Code Status:     Code Status Orders        Start     Ordered   01/10/15 1716  Full code   Continuous     01/10/15 1715        IV Access:    Peripheral IV   Procedures and diagnostic studies:   No results found.   Medical Consultants:    None.  Anti-Infectives:   Anti-infectives    Start     Dose/Rate Route Frequency Ordered Stop   01/10/15 2200  doxycycline (VIBRA-TABS) tablet 100 mg     100 mg Oral Every 12 hours 01/10/15 1715 01/17/15 2159   01/10/15 1730  cefTRIAXone (ROCEPHIN) 1 g in dextrose 5 % 50 mL IVPB     1 g 100 mL/hr over 30 Minutes Intravenous Every 24 hours 01/10/15 1715 01/16/15 1919   01/10/15 1415  levofloxacin (LEVAQUIN) IVPB 750 mg     750 mg 100 mL/hr over 90 Minutes Intravenous  Once 01/10/15 1402 01/10/15 1604      Subjective:    [name] weak, resting comfortably  Objective:    Filed Vitals:   01/17/15 2314 01/18/15 0000 01/18/15 0400 01/18/15 0821  BP:  118/68  118/64 125/57  Pulse:    82  Temp: 98.5 F (36.9 C)  98.3 F (36.8 C) 98.8 F (37.1 C)  TempSrc: Oral  Oral Axillary  Resp:  19 20 18   Height:      Weight:      SpO2:  96% 90% 92%    Intake/Output Summary (Last 24 hours) at 01/18/15 0926 Last data filed at 01/18/15 0834  Gross per 24 hour  Intake    493 ml  Output   1525 ml  Net  -1032 ml   Filed Weights   01/10/15 1657 01/15/15 1901  Weight: 98.2 kg (216 lb 7.9 oz) 92.6 kg (204 lb 2.3 oz)    Exam: Gen: Weak and confused  Cardiovascular:  Irregular rate and rhythm Chest and lungs:   Good air movement with crackles on the right. No wheezing rails. Abdomen:  Abdomen soft, NT/ND, + BS Extremities:  No C/E/C   Data Reviewed:    Labs: Basic Metabolic Panel:  Recent Labs Lab 01/13/15 0239 01/14/15 0235 01/15/15 0236 01/16/15 0325 01/18/15 0344  NA 141 143 143 139 138  K 4.3 4.1 4.1 3.7 3.9  CL 103 104 103 103 102  CO2 31 29 29 31 31   GLUCOSE 128* 149* 122* 98 91  BUN 16 17 13 11 11   CREATININE 0.68 0.68 0.63 0.64 0.63  CALCIUM 8.8* 8.8* 8.7* 8.2* 8.1*   GFR Estimated Creatinine Clearance: 80.7 mL/min (by C-G formula based on Cr of 0.63). Liver Function Tests:  Recent Labs Lab 01/15/15 0236 01/16/15 0325 01/18/15 0344  AST 43* 32 31  ALT 35 32 30  ALKPHOS 138* 140* 134*  BILITOT 0.4 0.4 0.4  PROT 5.3* 4.9* 4.8*  ALBUMIN 2.3* 2.1* 2.1*   No results for input(s): LIPASE, AMYLASE in the last 168 hours. No results for input(s): AMMONIA in the last 168 hours. Coagulation profile  Recent Labs Lab 01/12/15 1035  INR 1.46    CBC:  Recent Labs Lab 01/12/15 1035 01/15/15 0236 01/16/15 0325 01/18/15 0344  WBC 7.5 5.8 4.7 4.7  HGB 10.8* 10.4* 10.1* 10.4*  HCT 35.6* 34.1* 33.3* 32.8*  MCV 83.2 83.6 83.5 82.2  PLT 247 246 240 248   Cardiac Enzymes: No results for input(s): CKTOTAL, CKMB, CKMBINDEX, TROPONINI in the last 168 hours. BNP (last 3 results) No results for input(s): PROBNP in the  last 8760 hours. CBG:  Recent Labs Lab 01/12/15 0819  GLUCAP 131*   D-Dimer: No results for input(s): DDIMER in the last 72 hours. Hgb A1c: No results for input(s): HGBA1C in the last 72 hours. Lipid Profile: No results for input(s): CHOL, HDL, LDLCALC, TRIG, CHOLHDL, LDLDIRECT in the last 72 hours. Thyroid function studies:  Recent Labs  01/15/15 1123  TSH 2.449   Anemia work up: No results for input(s): VITAMINB12, FOLATE, FERRITIN, TIBC, IRON, RETICCTPCT in the last 72 hours. Sepsis Labs:  Recent Labs Lab 01/12/15 1035 01/15/15 0236 01/16/15 0325 01/18/15 0344  WBC 7.5 5.8 4.7 4.7   Microbiology Recent Results (from the past 240 hour(s))  Culture, Urine     Status: None   Collection Time: 01/10/15  2:24 PM  Result Value Ref Range Status   Specimen Description URINE, RANDOM  Final  Special Requests NONE  Final   Culture MULTIPLE SPECIES PRESENT, SUGGEST RECOLLECTION  Final   Report Status 01/12/2015 FINAL  Final  Culture, body fluid-bottle     Status: None (Preliminary result)   Collection Time: 01/13/15 11:44 AM  Result Value Ref Range Status   Specimen Description FLUID PERITONEAL  Final   Special Requests BAA 10MLS  Final   Culture NO GROWTH 4 DAYS  Final   Report Status PENDING  Incomplete  Gram stain     Status: None   Collection Time: 01/13/15 11:44 AM  Result Value Ref Range Status   Specimen Description FLUID PERITONEAL  Final   Special Requests NONE  Final   Gram Stain   Final    MODERATE WBC PRESENT,BOTH PMN AND MONONUCLEAR NO ORGANISMS SEEN    Report Status 01/13/2015 FINAL  Final  MRSA PCR Screening     Status: None   Collection Time: 01/14/15 10:50 AM  Result Value Ref Range Status   MRSA by PCR NEGATIVE NEGATIVE Final    Comment:        The GeneXpert MRSA Assay (FDA approved for NASAL specimens only), is one component of a comprehensive MRSA colonization surveillance program. It is not intended to diagnose MRSA infection nor to  guide or monitor treatment for MRSA infections.   Culture, body fluid-bottle     Status: None (Preliminary result)   Collection Time: 01/15/15  3:36 PM  Result Value Ref Range Status   Specimen Description FLUID PLEURAL LEFT  Final   Special Requests NONE  Final   Culture NO GROWTH 2 DAYS  Final   Report Status PENDING  Incomplete  Gram stain     Status: None   Collection Time: 01/15/15  3:36 PM  Result Value Ref Range Status   Specimen Description FLUID PLEURAL LEFT  Final   Special Requests NONE  Final   Gram Stain   Final    MODERATE WBC PRESENT,BOTH PMN AND MONONUCLEAR NO ORGANISMS SEEN    Report Status 01/15/2015 FINAL  Final  AFB culture with smear     Status: None (Preliminary result)   Collection Time: 01/15/15  3:36 PM  Result Value Ref Range Status   Specimen Description FLUID PLEURAL LEFT  Final   Special Requests NONE  Final   Acid Fast Smear   Final    NO ACID FAST BACILLI SEEN Performed at [facility]    Culture   Final    CULTURE WILL BE EXAMINED FOR 6 WEEKS BEFORE ISSUING A FINAL REPORT Performed at [facility]    Report Status PENDING  Incomplete     Medications:   . antiseptic oral rinse  7 mL Mouth Rinse BID  . aspirin  81 mg Oral Daily  . darifenacin  7.5 mg Oral Daily  . donepezil  10 mg Oral QHS  . enoxaparin (LOVENOX) injection  40 mg Subcutaneous Q24H  . furosemide  40 mg Oral Daily  . gabapentin  100 mg Oral QHS  . hydrOXYzine  5 mg Oral BID  . memantine  28 mg Oral Daily  . metoprolol tartrate  25 mg Oral QID  . sodium chloride  250 mL Intravenous Once  . sodium chloride  3 mL Intravenous Q12H   Continuous Infusions:    Time spent: 25 min   LOS: 8 days   [facility]  [facility] Pager [phone]  *Please refer to [email or web address], password [email or web address] to get updated schedule on who will round on  this patient, as hospitalists switch teams weekly. If 7PM-7AM, please contact night-coverage at [email or web address], password  [email or web address] for any overnight needs.  01/18/2015, 9:26 AM

## 2015-01-18 NOTE — Progress Notes (Signed)
Patient transported to 5W room 29. Son Gerald Stabs phoned for update and spoke [name] who was updated.

## 2015-01-19 DIAGNOSIS — I309 Acute pericarditis, unspecified: Secondary | ICD-10-CM

## 2015-01-19 DIAGNOSIS — J90 Pleural effusion, not elsewhere classified: Secondary | ICD-10-CM

## 2015-01-19 DIAGNOSIS — I5022 Chronic systolic (congestive) heart failure: Secondary | ICD-10-CM

## 2015-01-19 LAB — QUANTIFERON IN TUBE
QFT TB AG MINUS NIL VALUE: 0.01 IU/mL
QUANTIFERON MITOGEN VALUE: 2.11 IU/mL
QUANTIFERON TB AG VALUE: 0.03 [IU]/mL
QUANTIFERON TB GOLD: NEGATIVE
Quantiferon Nil Value: 0.02 IU/mL

## 2015-01-19 LAB — QUANTIFERON TB GOLD ASSAY (BLOOD)

## 2015-01-19 MED ORDER — FUROSEMIDE 40 MG PO TABS
40.0000 mg | ORAL_TABLET | Freq: Two times a day (BID) | ORAL | Status: DC
  Administered 2015-01-19 – 2015-01-20 (×2): 40 mg via ORAL
  Filled 2015-01-19: qty 1

## 2015-01-19 NOTE — Progress Notes (Addendum)
[facility] PROGRESS NOTE    Progress Note   [name] GNF:[id number] DOB: 06/29/1927 DOA: 01/10/2015 PCP: Eduardo Peng, NP   Brief Narrative:   [name] is an 81 y.o. male male with past medical history of chronic atrial fibrillation not on anticoagulation secondary to GI bleed brought to the hospital because of increase shortness of breath and confusion.   Assessment/Plan:  Acute respiratory failure with hypoxia likely due to large pericardial effusion:/Moderate-sized left pleural effusion /Multifocal opacities in the lung, improving Workup per infectious disease for pericardial effusion/pleural effusion pending Currently it appears to be transudative in nature, doubt infectious etiology pericardial effusion/pleural effusion rx with IV Rocephin and doxycycline for possible community-acquired pneumonia versus aspiration, rx for 7 days now . Discontinued antibiotics 10/28  Patient was found to have left pleural effusion with  left lower lobe collapse on CT, s/p  Thoracentesis 10/26 Repeat chest x-ray, to ensure stability was negative for acute changes . Gram stain negative, pericardial/pleural fluid culture no growth so far, AFB culture pending Repeat 2-D echo showed an EF of 45-50% with small to moderate pericardial effusion Hypoxemia improving currently requiring 2 L Pleural cytology shows no malignant cells  Pericarditis/pericardial effusion that was bloody and with slightly high WBC, without tamponade , no evidence of inflammatory etiology , rheumatoid factor, ANA negative , serologies positive for CMV and EBV IgG Patient status post pericardiocentesis  , 10/24 Doubt  pyogenic bacterial purulent pericarditis ID following  due to questionable infectious pericarditis?TB infection vs viral vs malignant . Requested Dr. Johnnye Sima to make final recommendations Patient anticipated to discharge tomorrow Follow pleural/ pericardial fluid culture, Appreciate further input  from infectious disease  2-D echo in one month airborne precautions has been DC'd,    Sepsis: Probable Cap/parapneumonic effusion? Unclear etiology of source, may be respiratory versus urinary. Resolved Continue IV Rocephin,/doxycycline day #7, DC'd  antibiotics  Chronic systolic heart failure: Currently compensated. Increase   Lasix 40 mg twice a day..  Repeat echo shows an EF of 45-50%,  continue beta blocker. Repeat 2-D echo with results as above  Acute encephalopathy with underlying diagnosis of dementia: On Namenda and Aricept likely due to infectious etiology.  Prolonged QT on EKG: Magnesium and potassium are within normal. QTC mildly improved from 580 -564.  Atrial fibrillation with RVR (HCC) Rate controlled for the most part some RVR likely from effusion. Cardiology increase metoprolol to 25 QID .. BP controlled. Not on anticoagulation due to hx GIB.Chronic Afib rate increased secondary to pericardial effusion, now improving  Dysphagia-speech therapy recommends   Dysphagia 3 (mechanical soft);Thin liquid  DVT Prophylaxis - Lovenox ordered.  Family Communication:  [phone], discussed with son chris , updated on a daily basis Disposition Plan: Anticipated discharge tomorrow with home health.  Code Status:     Code Status Orders        Start     Ordered   01/10/15 1716  Full code   Continuous     01/10/15 1715        IV Access:    Peripheral IV   Procedures and diagnostic studies:   Dg Chest Port 1 View  2015-02-07  CLINICAL DATA:  Shortness of breath. Status post left thoracentesis on 01/15/2015 EXAM: PORTABLE CHEST 1 VIEW COMPARISON:  01/15/2015 FINDINGS: Stable cardiac enlargement. Stable left lower lobe atelectasis/ consolidation. Stable suggestion of mild interstitial edema. No significant reaccumulation of left pleural fluid. No pneumothorax. IMPRESSION: Stable cardiac enlargement, left lower lobe consolidation and mild interstitial  edema. No  significant reaccumulation of left pleural fluid since thoracentesis 3 days ago. Electronically Signed   By: Aletta Edouard M.D.   On: 01/18/2015 11:48     Medical Consultants:    None.  Anti-Infectives:   Anti-infectives    Start     Dose/Rate Route Frequency Ordered Stop   01/10/15 2200  doxycycline (VIBRA-TABS) tablet 100 mg     100 mg Oral Every 12 hours 01/10/15 1715 01/17/15 2159   01/10/15 1730  cefTRIAXone (ROCEPHIN) 1 g in dextrose 5 % 50 mL IVPB     1 g 100 mL/hr over 30 Minutes Intravenous Every 24 hours 01/10/15 1715 01/16/15 1919   01/10/15 1415  levofloxacin (LEVAQUIN) IVPB 750 mg     750 mg 100 mL/hr over 90 Minutes Intravenous  Once 01/10/15 1402 01/10/15 1604      Subjective:    [name] awake, following commands, ambulated yesterday with physical therapy with the help of a rolling walker  Objective:    Filed Vitals:   01/18/15 1228 01/18/15 1600 01/18/15 2040 01/19/15 0621  BP: 127/59 132/59 140/50 119/55  Pulse: 70 70 71 63  Temp: 97.7 F (36.5 C) 98.3 F (36.8 C) 98.6 F (37 C) 97.4 F (36.3 C)  TempSrc: Oral Oral Oral Oral  Resp: 22 19 18 18   Height:      Weight:      SpO2: 97% 97% 96% 95%    Intake/Output Summary (Last 24 hours) at 01/19/15 1057 Last data filed at 01/19/15 0900  Gross per 24 hour  Intake    543 ml  Output    900 ml  Net   -357 ml   Filed Weights   01/10/15 1657 01/15/15 1901  Weight: 98.2 kg (216 lb 7.9 oz) 92.6 kg (204 lb 2.3 oz)    Exam: Gen: Weak and confused  Cardiovascular:  Irregular rate and rhythm Chest and lungs:   Good air movement with crackles on the right. No wheezing rails. Abdomen:  Abdomen soft, NT/ND, + BS Extremities:  No C/E/C   Data Reviewed:    Labs: Basic Metabolic Panel:  Recent Labs Lab 01/14/15 0235 01/15/15 0236 01/16/15 0325 01/18/15 0344 01/18/15 1115  NA 143 143 139 138 142  K 4.1 4.1 3.7 3.9 3.9  CL 104 103 103 102 103  CO2 29 29 31 31 31   GLUCOSE 149* 122*  98 91 121*  BUN 17 13 11 11 11   CREATININE 0.68 0.63 0.64 0.63 0.74  CALCIUM 8.8* 8.7* 8.2* 8.1* 8.7*   GFR Estimated Creatinine Clearance: 80.7 mL/min (by C-G formula based on Cr of 0.74). Liver Function Tests:  Recent Labs Lab 01/15/15 0236 01/16/15 0325 01/18/15 0344  AST 43* 32 31  ALT 35 32 30  ALKPHOS 138* 140* 134*  BILITOT 0.4 0.4 0.4  PROT 5.3* 4.9* 4.8*  ALBUMIN 2.3* 2.1* 2.1*   No results for input(s): LIPASE, AMYLASE in the last 168 hours. No results for input(s): AMMONIA in the last 168 hours. Coagulation profile No results for input(s): INR, PROTIME in the last 168 hours.  CBC:  Recent Labs Lab 01/15/15 0236 01/16/15 0325 01/18/15 0344  WBC 5.8 4.7 4.7  HGB 10.4* 10.1* 10.4*  HCT 34.1* 33.3* 32.8*  MCV 83.6 83.5 82.2  PLT 246 240 248   Cardiac Enzymes: No results for input(s): CKTOTAL, CKMB, CKMBINDEX, TROPONINI in the last 168 hours. BNP (last 3 results) No results for input(s): PROBNP in the last 8760 hours.  CBG: No results for input(s): GLUCAP in the last 168 hours. D-Dimer: No results for input(s): DDIMER in the last 72 hours. Hgb A1c: No results for input(s): HGBA1C in the last 72 hours. Lipid Profile: No results for input(s): CHOL, HDL, LDLCALC, TRIG, CHOLHDL, LDLDIRECT in the last 72 hours. Thyroid function studies: No results for input(s): TSH, T4TOTAL, T3FREE, THYROIDAB in the last 72 hours.  Invalid input(s): FREET3 Anemia work up: No results for input(s): VITAMINB12, FOLATE, FERRITIN, TIBC, IRON, RETICCTPCT in the last 72 hours. Sepsis Labs:  Recent Labs Lab 01/15/15 0236 01/16/15 0325 01/18/15 0344  WBC 5.8 4.7 4.7   Microbiology Recent Results (from the past 240 hour(s))  Culture, Urine     Status: None   Collection Time: 01/10/15  2:24 PM  Result Value Ref Range Status   Specimen Description URINE, RANDOM  Final   Special Requests NONE  Final   Culture MULTIPLE SPECIES PRESENT, SUGGEST RECOLLECTION  Final   Report  Status 01/12/2015 FINAL  Final  Culture, body fluid-bottle     Status: None   Collection Time: 01/13/15 11:44 AM  Result Value Ref Range Status   Specimen Description FLUID PERITONEAL  Final   Special Requests BAA 10MLS  Final   Culture NO GROWTH 5 DAYS  Final   Report Status 01/18/2015 FINAL  Final  Gram stain     Status: None   Collection Time: 01/13/15 11:44 AM  Result Value Ref Range Status   Specimen Description FLUID PERITONEAL  Final   Special Requests NONE  Final   Gram Stain   Final    MODERATE WBC PRESENT,BOTH PMN AND MONONUCLEAR NO ORGANISMS SEEN    Report Status 01/13/2015 FINAL  Final  AFB culture with smear     Status: None (Preliminary result)   Collection Time: 01/13/15 12:49 PM  Result Value Ref Range Status   Specimen Description PERICARDIAL  Final   Special Requests Immunocompromised  Final   Acid Fast Smear   Final    NO ACID FAST BACILLI SEEN Performed at [facility]    Culture   Final    CULTURE WILL BE EXAMINED FOR 6 WEEKS BEFORE ISSUING A FINAL REPORT Performed at [facility]    Report Status PENDING  Incomplete  MRSA PCR Screening     Status: None   Collection Time: 01/14/15 10:50 AM  Result Value Ref Range Status   MRSA by PCR NEGATIVE NEGATIVE Final    Comment:        The GeneXpert MRSA Assay (FDA approved for NASAL specimens only), is one component of a comprehensive MRSA colonization surveillance program. It is not intended to diagnose MRSA infection nor to guide or monitor treatment for MRSA infections.   Culture, body fluid-bottle     Status: None (Preliminary result)   Collection Time: 01/15/15  3:36 PM  Result Value Ref Range Status   Specimen Description FLUID PLEURAL LEFT  Final   Special Requests NONE  Final   Culture NO GROWTH 4 DAYS  Final   Report Status PENDING  Incomplete  Gram stain     Status: None   Collection Time: 01/15/15  3:36 PM  Result Value Ref Range Status   Specimen Description FLUID  PLEURAL LEFT  Final   Special Requests NONE  Final   Gram Stain   Final    MODERATE WBC PRESENT,BOTH PMN AND MONONUCLEAR NO ORGANISMS SEEN    Report Status 01/15/2015 FINAL  Final  AFB culture with  smear     Status: None (Preliminary result)   Collection Time: 01/15/15  3:36 PM  Result Value Ref Range Status   Specimen Description FLUID PLEURAL LEFT  Final   Special Requests NONE  Final   Acid Fast Smear   Final    NO ACID FAST BACILLI SEEN Performed at [facility]    Culture   Final    CULTURE WILL BE EXAMINED FOR 6 WEEKS BEFORE ISSUING A FINAL REPORT Performed at [facility]    Report Status PENDING  Incomplete     Medications:   . antiseptic oral rinse  7 mL Mouth Rinse BID  . aspirin  81 mg Oral Daily  . darifenacin  7.5 mg Oral Daily  . donepezil  10 mg Oral QHS  . enoxaparin (LOVENOX) injection  40 mg Subcutaneous Q24H  . furosemide  40 mg Oral Daily  . gabapentin  100 mg Oral QHS  . hydrOXYzine  5 mg Oral BID  . memantine  28 mg Oral Daily  . metoprolol tartrate  25 mg Oral QID  . sodium chloride  250 mL Intravenous Once  . sodium chloride  3 mL Intravenous Q12H   Continuous Infusions:    Time spent: 25 min   LOS: 9 days   [facility]  [facility] Pager [phone]  *Please refer to [email or web address], password [email or web address] to get updated schedule on who will round on this patient, as hospitalists switch teams weekly. If 7PM-7AM, please contact night-coverage at [email or web address], password [email or web address] for any overnight needs.  01/19/2015, 10:57 AM

## 2015-01-19 NOTE — Progress Notes (Signed)
INFECTIOUS DISEASE PROGRESS NOTE  ID: CORBETT MOULDER is a 81 y.o. male with  Principal Problem:   Pericardial effusion with cardiac tamponade Active Problems:   Atrial fibrillation (HCC)   Dementia   CAP (community acquired pneumonia)   Prolonged Q-T interval on ECG   Acute respiratory failure (HCC)   Chronic anemia   Chronic systolic (congestive) heart failure (HCC)   Acute encephalopathy   Atrial fibrillation with RVR (HCC)   Pericardial effusion without cardiac tamponade   Acute pericarditis   S/P thoracentesis  Subjective: Without complaints  Abtx:  Anti-infectives    Start     Dose/Rate Route Frequency Ordered Stop   01/10/15 2200  doxycycline (VIBRA-TABS) tablet 100 mg     100 mg Oral Every 12 hours 01/10/15 1715 01/17/15 2159   01/10/15 1730  cefTRIAXone (ROCEPHIN) 1 g in dextrose 5 % 50 mL IVPB     1 g 100 mL/hr over 30 Minutes Intravenous Every 24 hours 01/10/15 1715 01/16/15 1919   01/10/15 1415  levofloxacin (LEVAQUIN) IVPB 750 mg     750 mg 100 mL/hr over 90 Minutes Intravenous  Once 01/10/15 1402 01/10/15 1604      Medications:  Scheduled: . antiseptic oral rinse  7 mL Mouth Rinse BID  . aspirin  81 mg Oral Daily  . darifenacin  7.5 mg Oral Daily  . donepezil  10 mg Oral QHS  . enoxaparin (LOVENOX) injection  40 mg Subcutaneous Q24H  . furosemide  40 mg Oral BID  . gabapentin  100 mg Oral QHS  . hydrOXYzine  5 mg Oral BID  . memantine  28 mg Oral Daily  . metoprolol tartrate  25 mg Oral QID  . sodium chloride  250 mL Intravenous Once  . sodium chloride  3 mL Intravenous Q12H    Objective: Vital signs in last 24 hours: Temp:  [97.4 F (36.3 C)-98.6 F (37 C)] 97.4 F (36.3 C) (10/30 0621) Pulse Rate:  [63-71] 63 (10/30 0621) Resp:  [18-22] 18 (10/30 0621) BP: (119-140)/(50-59) 119/55 mmHg (10/30 0621) SpO2:  [95 %-97 %] 95 % (10/30 0621)   General appearance: alert, cooperative, fatigued and no distress Resp: diminished breath sounds  bilaterally Cardio: regular rate and rhythm GI: normal findings: bowel sounds normal and soft, non-tender  Lab Results  Recent Labs  01/18/15 0344 01/18/15 1115  WBC 4.7  --   HGB 10.4*  --   HCT 32.8*  --   NA 138 142  K 3.9 3.9  CL 102 103  CO2 31 31  BUN 11 11  CREATININE 0.63 0.74   Liver Panel  Recent Labs  01/18/15 0344  PROT 4.8*  ALBUMIN 2.1*  AST 31  ALT 30  ALKPHOS 134*  BILITOT 0.4   Sedimentation Rate No results for input(s): ESRSEDRATE in the last 72 hours. C-Reactive Protein No results for input(s): CRP in the last 72 hours.  Microbiology: Recent Results (from the past 240 hour(s))  Culture, Urine     Status: None   Collection Time: 01/10/15  2:24 PM  Result Value Ref Range Status   Specimen Description URINE, RANDOM  Final   Special Requests NONE  Final   Culture MULTIPLE SPECIES PRESENT, SUGGEST RECOLLECTION  Final   Report Status 01/12/2015 FINAL  Final  Culture, body fluid-bottle     Status: None   Collection Time: 01/13/15 11:44 AM  Result Value Ref Range Status   Specimen Description FLUID PERITONEAL  Final  Special Requests BAA 10MLS  Final   Culture NO GROWTH 5 DAYS  Final   Report Status 01/18/2015 FINAL  Final  Gram stain     Status: None   Collection Time: 01/13/15 11:44 AM  Result Value Ref Range Status   Specimen Description FLUID PERITONEAL  Final   Special Requests NONE  Final   Gram Stain   Final    MODERATE WBC PRESENT,BOTH PMN AND MONONUCLEAR NO ORGANISMS SEEN    Report Status 01/13/2015 FINAL  Final  AFB culture with smear     Status: None (Preliminary result)   Collection Time: 01/13/15 12:49 PM  Result Value Ref Range Status   Specimen Description PERICARDIAL  Final   Special Requests Immunocompromised  Final   Acid Fast Smear   Final    NO ACID FAST BACILLI SEEN Performed at [facility]    Culture   Final    CULTURE WILL BE EXAMINED FOR 6 WEEKS BEFORE ISSUING A FINAL REPORT Performed at [facility]    Report Status PENDING  Incomplete  MRSA PCR Screening     Status: None   Collection Time: 01/14/15 10:50 AM  Result Value Ref Range Status   MRSA by PCR NEGATIVE NEGATIVE Final    Comment:        The GeneXpert MRSA Assay (FDA approved for NASAL specimens only), is one component of a comprehensive MRSA colonization surveillance program. It is not intended to diagnose MRSA infection nor to guide or monitor treatment for MRSA infections.   Culture, body fluid-bottle     Status: None (Preliminary result)   Collection Time: 01/15/15  3:36 PM  Result Value Ref Range Status   Specimen Description FLUID PLEURAL LEFT  Final   Special Requests NONE  Final   Culture NO GROWTH 4 DAYS  Final   Report Status PENDING  Incomplete  Gram stain     Status: None   Collection Time: 01/15/15  3:36 PM  Result Value Ref Range Status   Specimen Description FLUID PLEURAL LEFT  Final   Special Requests NONE  Final   Gram Stain   Final    MODERATE WBC PRESENT,BOTH PMN AND MONONUCLEAR NO ORGANISMS SEEN    Report Status 01/15/2015 FINAL  Final  AFB culture with smear     Status: None (Preliminary result)   Collection Time: 01/15/15  3:36 PM  Result Value Ref Range Status   Specimen Description FLUID PLEURAL LEFT  Final   Special Requests NONE  Final   Acid Fast Smear   Final    NO ACID FAST BACILLI SEEN Performed at [facility]    Culture   Final    CULTURE WILL BE EXAMINED FOR 6 WEEKS BEFORE ISSUING A FINAL REPORT Performed at [facility]    Report Status PENDING  Incomplete    Studies/Results: Dg Chest Port 1 View  01/18/2015  CLINICAL DATA:  Shortness of breath. Status post left thoracentesis on 01/15/2015 EXAM: PORTABLE CHEST 1 VIEW COMPARISON:  01/15/2015 FINDINGS: Stable cardiac enlargement. Stable left lower lobe atelectasis/ consolidation. Stable suggestion of mild interstitial edema. No significant reaccumulation of left pleural fluid. No pneumothorax.  IMPRESSION: Stable cardiac enlargement, left lower lobe consolidation and mild interstitial edema. No significant reaccumulation of left pleural fluid since thoracentesis 3 days ago. Electronically Signed   By: Aletta Edouard M.D.   On: 01/18/2015 11:48     Assessment/Plan: Pericardial Effusion Pleural Effusion CHF  Would await his fluid  AFB Cx (ngtd) Routine cx is ngtd Check echo virus serologies (if positive, these will need to have convalescent levels checked to confirm) His coxsackie are positive broadly, will need f/u serologies for these.  His EBV and CMV do not suggest acute infection Consider PCR on fluid/tissue for viral pathogens?  Total days of antibiotics: off         Bobby Rumpf Infectious Diseases (pager) [phone] [email or web address] 01/19/2015, 11:51 AM  LOS: 9 days

## 2015-01-20 LAB — COMPREHENSIVE METABOLIC PANEL
ALT: 32 U/L (ref 17–63)
ANION GAP: 7 (ref 5–15)
AST: 32 U/L (ref 15–41)
Albumin: 2.1 g/dL — ABNORMAL LOW (ref 3.5–5.0)
Alkaline Phosphatase: 133 U/L — ABNORMAL HIGH (ref 38–126)
BILIRUBIN TOTAL: 0.4 mg/dL (ref 0.3–1.2)
BUN: 13 mg/dL (ref 6–20)
CHLORIDE: 100 mmol/L — AB (ref 101–111)
CO2: 31 mmol/L (ref 22–32)
Calcium: 8.3 mg/dL — ABNORMAL LOW (ref 8.9–10.3)
Creatinine, Ser: 0.68 mg/dL (ref 0.61–1.24)
Glucose, Bld: 94 mg/dL (ref 65–99)
POTASSIUM: 4.1 mmol/L (ref 3.5–5.1)
Sodium: 138 mmol/L (ref 135–145)
TOTAL PROTEIN: 5 g/dL — AB (ref 6.5–8.1)

## 2015-01-20 LAB — CULTURE, BODY FLUID W GRAM STAIN -BOTTLE: Culture: NO GROWTH

## 2015-01-20 MED ORDER — METOPROLOL TARTRATE 25 MG PO TABS: 25.0000 mg | ORAL_TABLET | Freq: Four times a day (QID) | ORAL | Status: DC

## 2015-01-20 MED ORDER — FUROSEMIDE 40 MG PO TABS: 40.0000 mg | ORAL_TABLET | Freq: Two times a day (BID) | ORAL | Status: DC

## 2015-01-20 NOTE — Care Management Note (Addendum)
Case Management Note  Patient Details  Name: [name] MRN: [id number] Date of Birth: 1927/12/26  Subjective/Objective:      Patient is for dc today, he is set up with [facility] for HHPT/OT.              Action/Plan:   Expected Discharge Date:                  Expected Discharge Plan:  [place]  In-House Referral:     Discharge planning Services  CM Consult  Post Acute Care Choice:  Home Health Choice offered to:  Patient  DME Arranged:   oxygen DME Agency:    [place]  HH Arranged:  PT, OT [facility] Agency:  [place]  Status of Service:  Completed, signed off  Medicare Important Message Given:  Yes-third notification given Date Medicare IM Given:    Medicare IM give by:    Date Additional Medicare IM Given:    Additional Medicare Important Message give by:     If discussed at [place] of Stay Meetings, dates discussed:    Additional Comments:  Zenon Mayo, RN 01/20/2015, 11:52 AM

## 2015-01-20 NOTE — Progress Notes (Signed)
SATURATION QUALIFICATIONS: (This note is used to comply with regulatory documentation for home oxygen)  Patient Saturations on Room Air at Rest = 92-94%  Patient Saturations on Room Air while Ambulating = 80%  Patient Saturations on 4 Liters of oxygen while Ambulating = 94%  Please briefly explain why patient needs home oxygen: patient's oxygen levels drop while ambulating

## 2015-01-20 NOTE — Progress Notes (Signed)
NCM received call from [place] stating son would like to add a Education officer, museum to [employer].  Miranda with [facility] notified.

## 2015-01-20 NOTE — Discharge Summary (Addendum)
Physician Discharge Summary  [name] MRN: [id number] DOB/AGE: [birth year] 81 y.o.  PCP: [name], NP   Admit date: 01/10/2015 Discharge date: 01/20/2015  Discharge Diagnoses:     Principal Problem:   Pericardial effusion without cardiac tamponade Active Problems:   Atrial fibrillation (HCC)   Dementia   CAP (community acquired pneumonia)   Prolonged Q-T interval on ECG   Acute respiratory failure (HCC)   Chronic anemia   Chronic systolic (congestive) heart failure (HCC)   Acute encephalopathy   Atrial fibrillation with RVR (HCC)   Pericardial effusion without cardiac tamponade   Acute pericarditis   S/P thoracentesis Nonmalignant left pleural effusion likely transudative   Follow-up recommendations Follow-up with PCP in 3-5 days , including all  additional recommended appointments as below Follow-up CBC, CMP in 3-5 days Patient needs follow-up 2-D echo in 3-4 weeks Follow-up with PCP, cardiology, infectious disease      Medication List    TAKE these medications        aspirin 81 MG tablet  Take 81 mg by mouth daily.     donepezil 10 MG tablet  Commonly known as:  ARICEPT  Take 1 tablet (10 mg total) by mouth at bedtime.     ferrous sulfate 325 (65 FE) MG tablet  Take 487.5 mg by mouth 2 (two) times daily.     folic acid 1 MG tablet  Commonly known as:  FOLVITE  Take 1 mg by mouth 2 (two) times daily.     furosemide 40 MG tablet  Commonly known as:  LASIX  Take 1 tablet (40 mg total) by mouth 2 (two) times daily.     gabapentin 100 MG capsule  Commonly known as:  NEURONTIN  Take 100 mg by mouth at bedtime.     hydrOXYzine 10 MG tablet  Commonly known as:  ATARAX/VISTARIL  TAKE (1) TABLET TWICE A DAY AS NEEDED.     memantine 28 MG Cp24 24 hr capsule  Commonly known as:  NAMENDA XR  Take 1 capsule (28 mg total) by mouth daily.     metoprolol tartrate 25 MG tablet  Commonly known as:  LOPRESSOR  Take 1 tablet (25 mg total) by mouth 4  (four) times daily.     multivitamin with minerals Tabs tablet  Take 1 tablet by mouth daily.     nitroGLYCERIN 0.4 MG SL tablet  Commonly known as:  NITROSTAT  Place 1 tablet (0.4 mg total) under the tongue every 5 (five) minutes as needed for chest pain.     PRESERVISION AREDS 2 PO  Take 1 tablet by mouth 2 (two) times daily.     solifenacin 5 MG tablet  Commonly known as:  VESICARE  Take 1 tablet (5 mg total) by mouth daily.     triamcinolone cream 0.1 %  Commonly known as:  KENALOG  Apply 1 application topically 2 (two) times daily.         Discharge Condition stable   Discharge Instructions       Discharge Instructions    Diet - low sodium heart healthy    Complete by:  As directed      Increase activity slowly    Complete by:  As directed            Allergies  Allergen Reactions  . Cephalexin Other (See Comments)    Unknown reaction  . Furacin [Nitrofurazone] Other (See Comments)    Unknown reaction  . Macrodantin Other (See Comments)  Unknown reaction  . Zestril [Lisinopril] Other (See Comments)    Unknown reaction      Disposition: 01-Home or Self Care   Consults: Cardiology Infectious disease     Significant Diagnostic Studies:  Dg Chest 1 View  01/15/2015  CLINICAL DATA:  Status post left thoracentesis. EXAM: CHEST 1 VIEW COMPARISON:  01/14/2015, 01/11/2015 FINDINGS: Marked cardiac silhouette enlargement compatible with cardiomegaly and associated pericardial effusion by comparison chest CT from yesterday. Decrease in the left effusion following thoracentesis. Residual left pleural fluid evident. Negative for pneumothorax. Bibasilar atelectasis persist worse on the left. IMPRESSION: Decrease in left effusion following thoracentesis.  No pneumothorax. Otherwise stable exam. Electronically Signed   By: Jerilynn Mages.  Shick M.D.   On: 01/15/2015 16:16   Dg Chest 2 View  01/11/2015  CLINICAL DATA:  Acute respiratory failure. EXAM: CHEST  2 VIEW  COMPARISON:  01/10/2015 FINDINGS: Marked cardiomegaly is unchanged from yesterday's study but significantly increased from 07/16/2014. Thoracic aortic calcification is noted. Retrocardiac opacity in the left lower lobe is unchanged with hazy density on the lateral projection. There is at most a small left pleural effusion. There is minimal right basilar atelectasis. No overt pulmonary edema. No acute osseous abnormality identified. IMPRESSION: 1. Marked cardiomegaly with a component of pericardial effusion suspected. 2. Unchanged left lower lobe opacity favored to represent atelectasis although pneumonia is not excluded. 3. Likely small left pleural effusion. Electronically Signed   By: Logan Bores M.D.   On: 01/11/2015 09:26   Ct Angio Chest Pe W/cm &/or Wo Cm  01/14/2015  CLINICAL DATA:  Shortness of breath and hemoptysis. EXAM: CT ANGIOGRAPHY CHEST WITH CONTRAST TECHNIQUE: Multidetector CT imaging of the chest was performed using the standard protocol during bolus administration of intravenous contrast. Multiplanar CT image reconstructions and MIPs were obtained to evaluate the vascular anatomy. CONTRAST:  160m OMNIPAQUE IOHEXOL 350 MG/ML SOLN COMPARISON:  10/28/2014 FINDINGS: THORACIC INLET/BODY WALL: No acute abnormality. MEDIASTINUM: Cardiomegaly with marked biventricular enlargement. There is a small to moderate pericardial effusion which is near water density. Patient is status post pericardiocentesis, accounting for gas in the subxiphoid region. Detection of pericardial serosal enhancement is limited by contrast timing. Diffuse atherosclerosis, including the coronary arteries. No aortic dissection. No convincing acute pulmonary embolism with bolus dispersion and frequent motion limiting sensitivity. Right heart failure with intrahepatic vein reflux and distension. LUNG WINDOWS: Moderate layering left pleural effusion with near complete left lower lobe collapse. Patchy airspace opacity in the right  lung with nonspecific pattern. No interlobular septal thickening. No primary source of hemoptysis is visualized. UPPER ABDOMEN: No acute findings. OSSEOUS: No acute fracture.  No suspicious lytic or blastic lesions. Review of the MIP images confirms the above findings. IMPRESSION: 1. Motion limited study.  No convincing acute pulmonary embolism. 2. Small to moderate pericardial effusion. No CT before the recent pericardiocentesis for comparison. 3. Biatrial enlargement with changes of right heart failure. 4. Moderate layering left pleural effusion with near complete left lower lobe collapse. 5. Multi focal airspace disease in the right lung which could reflect pneumonia or patchy alveolar hemorrhage (given history of hemoptysis). Electronically Signed   By: JMonte FantasiaM.D.   On: 01/14/2015 16:32   Dg Chest Port 1 View  01/18/2015  CLINICAL DATA:  Shortness of breath. Status post left thoracentesis on 01/15/2015 EXAM: PORTABLE CHEST 1 VIEW COMPARISON:  01/15/2015 FINDINGS: Stable cardiac enlargement. Stable left lower lobe atelectasis/ consolidation. Stable suggestion of mild interstitial edema. No significant reaccumulation of left pleural fluid.  No pneumothorax. IMPRESSION: Stable cardiac enlargement, left lower lobe consolidation and mild interstitial edema. No significant reaccumulation of left pleural fluid since thoracentesis 3 days ago. Electronically Signed   By: Aletta Edouard M.D.   On: 01/18/2015 11:48   Dg Chest Portable 1 View  01/10/2015  CLINICAL DATA:  Altered mental status and shortness of breath. EXAM: PORTABLE CHEST 1 VIEW COMPARISON:  07/16/2014 and 02/28/2014 FINDINGS: Lungs are adequately inflated with opacification over the left base/ retrocardiac region which may be due to effusion with atelectasis versus infection. Right lung is clear. There is marked cardiomegaly slightly worse as cannot exclude a component of pericardial fluid. Calcified plaque present over the aortic arch.  Degenerative changes of the spine. IMPRESSION: Opacification over the left base/ retrocardiac region which may be due to effusion/atelectasis although cannot exclude infection. PA and lateral chest x-ray would be better for evaluation of the left base as recommend follow-up to resolution. Marked cardiomegaly slightly worse as cannot exclude a component of pericardial fluid. Electronically Signed   By: Marin Olp M.D.   On: 01/10/2015 12:48   US Thoracentesis Asp Pleural Space W/img Guide  01/16/2015  INDICATION: Symptomatic left sided pleural effusion EXAM: US THORACENTESIS ASP PLEURAL SPACE W/IMG GUIDE COMPARISON:  CTA chest 01/14/2015. MEDICATIONS: None COMPLICATIONS: None immediate TECHNIQUE: Informed written consent was obtained from the patient's son after a discussion of the risks, benefits and alternatives to treatment. A timeout was performed prior to the initiation of the procedure. Initial ultrasound scanning demonstrates a left pleural effusion. The lower chest was prepped and draped in the usual sterile fashion. 1% lidocaine was used for local anesthesia. Under direct ultrasound guidance, a 19 gauge, 7-cm, Yueh catheter was introduced. An ultrasound image was saved for documentation purposes. The thoracentesis was performed. The catheter was removed and a dressing was applied. The patient tolerated the procedure well without immediate post procedural complication. The patient was escorted to have an upright chest radiograph. FINDINGS: A total of approximately 1.1 liters of serous fluid was removed. Requested samples were sent to the laboratory. IMPRESSION: Successful ultrasound-guided left sided thoracentesis yielding 1.1 Liters of pleural fluid. Read By:  Tsosie Billing PA-C Electronically Signed   By: Jacqulynn Cadet M.D.   On: 01/15/2015 16:38     Echo 10/25 LV EF: 45% -  50%  ------------------------------------------------------------------- Indications:   Pericardial effusion  423.9.  ------------------------------------------------------------------- History:  PMH:  Murmur. Atrial fibrillation. Risk factors: Diabetes mellitus.  ------------------------------------------------------------------- Study Conclusions  - Left ventricle: The cavity size was normal. Wall thickness was increased in a pattern of mild LVH. Systolic function was mildly reduced. The estimated ejection fraction was in the range of 45% to 50%. - Left atrium: The atrium was moderately dilated. - Tricuspid valve: There was severe regurgitation. - Pulmonary arteries: Systolic pressure was mildly increased. PA peak pressure: 37 mm Hg (S). - Pericardium, extracardiac: A small to moderate pericardial effusion was identified.   Filed Weights   01/10/15 1657 01/15/15 1901  Weight: 98.2 kg (216 lb 7.9 oz) 92.6 kg (204 lb 2.3 oz)     Microbiology: Recent Results (from the past 240 hour(s))  Culture, Urine     Status: None   Collection Time: 01/10/15  2:24 PM  Result Value Ref Range Status   Specimen Description URINE, RANDOM  Final   Special Requests NONE  Final   Culture MULTIPLE SPECIES PRESENT, SUGGEST RECOLLECTION  Final   Report Status 01/12/2015 FINAL  Final  Culture, body fluid-bottle  Status: None   Collection Time: 01/13/15 11:44 AM  Result Value Ref Range Status   Specimen Description FLUID PERITONEAL  Final   Special Requests BAA 10MLS  Final   Culture NO GROWTH 5 DAYS  Final   Report Status 01/18/2015 FINAL  Final  Gram stain     Status: None   Collection Time: 01/13/15 11:44 AM  Result Value Ref Range Status   Specimen Description FLUID PERITONEAL  Final   Special Requests NONE  Final   Gram Stain   Final    MODERATE WBC PRESENT,BOTH PMN AND MONONUCLEAR NO ORGANISMS SEEN    Report Status 01/13/2015 FINAL  Final  AFB culture with smear     Status: None (Preliminary result)   Collection Time: 01/13/15 12:49 PM  Result Value Ref Range Status    Specimen Description PERICARDIAL  Final   Special Requests Immunocompromised  Final   Acid Fast Smear   Final    NO ACID FAST BACILLI SEEN Performed at [facility]    Culture   Final    CULTURE WILL BE EXAMINED FOR 6 WEEKS BEFORE ISSUING A FINAL REPORT Performed at [facility]    Report Status PENDING  Incomplete  MRSA PCR Screening     Status: None   Collection Time: 01/14/15 10:50 AM  Result Value Ref Range Status   MRSA by PCR NEGATIVE NEGATIVE Final    Comment:        The GeneXpert MRSA Assay (FDA approved for NASAL specimens only), is one component of a comprehensive MRSA colonization surveillance program. It is not intended to diagnose MRSA infection nor to guide or monitor treatment for MRSA infections.   Culture, body fluid-bottle     Status: None (Preliminary result)   Collection Time: 01/15/15  3:36 PM  Result Value Ref Range Status   Specimen Description FLUID PLEURAL LEFT  Final   Special Requests NONE  Final   Culture NO GROWTH 4 DAYS  Final   Report Status PENDING  Incomplete  Gram stain     Status: None   Collection Time: 01/15/15  3:36 PM  Result Value Ref Range Status   Specimen Description FLUID PLEURAL LEFT  Final   Special Requests NONE  Final   Gram Stain   Final    MODERATE WBC PRESENT,BOTH PMN AND MONONUCLEAR NO ORGANISMS SEEN    Report Status 01/15/2015 FINAL  Final  AFB culture with smear     Status: None (Preliminary result)   Collection Time: 01/15/15  3:36 PM  Result Value Ref Range Status   Specimen Description FLUID PLEURAL LEFT  Final   Special Requests NONE  Final   Acid Fast Smear   Final    NO ACID FAST BACILLI SEEN Performed at [facility]    Culture   Final    CULTURE WILL BE EXAMINED FOR 6 WEEKS BEFORE ISSUING A FINAL REPORT Performed at [facility]    Report Status PENDING  Incomplete       Blood Culture    Component Value Date/Time   SDES FLUID PLEURAL LEFT 01/15/2015 1536    SDES FLUID PLEURAL LEFT 01/15/2015 1536   SDES FLUID PLEURAL LEFT 01/15/2015 1536   SPECREQUEST NONE 01/15/2015 1536   SPECREQUEST NONE 01/15/2015 1536   SPECREQUEST NONE 01/15/2015 1536   CULT NO GROWTH 4 DAYS 01/15/2015 1536   CULT  01/15/2015 1536    CULTURE WILL BE EXAMINED FOR 6 WEEKS BEFORE ISSUING A FINAL  REPORT Performed at [place] PENDING 01/15/2015 1536   REPTSTATUS 01/15/2015 FINAL 01/15/2015 1536   REPTSTATUS PENDING 01/15/2015 1536      Labs: Results for orders placed or performed during the hospital encounter of 01/10/15 (from the past 48 hour(s))  Basic metabolic panel     Status: Abnormal   Collection Time: 01/18/15 11:15 AM  Result Value Ref Range   Sodium 142 135 - 145 mmol/L   Potassium 3.9 3.5 - 5.1 mmol/L   Chloride 103 101 - 111 mmol/L   CO2 31 22 - 32 mmol/L   Glucose, Bld 121 (H) 65 - 99 mg/dL   BUN 11 6 - 20 mg/dL   Creatinine, Ser 0.74 0.61 - 1.24 mg/dL   Calcium 8.7 (L) 8.9 - 10.3 mg/dL   GFR calc non Af Amer >60 >60 mL/min   GFR calc Af Amer >60 >60 mL/min    Comment: (NOTE) The eGFR has been calculated using the CKD EPI equation. This calculation has not been validated in all clinical situations. eGFR's persistently <60 mL/min signify possible Chronic Kidney Disease.    Anion gap 8 5 - 15  Comprehensive metabolic panel     Status: Abnormal   Collection Time: 01/20/15  4:26 AM  Result Value Ref Range   Sodium 138 135 - 145 mmol/L   Potassium 4.1 3.5 - 5.1 mmol/L   Chloride 100 (L) 101 - 111 mmol/L   CO2 31 22 - 32 mmol/L   Glucose, Bld 94 65 - 99 mg/dL   BUN 13 6 - 20 mg/dL   Creatinine, Ser 0.68 0.61 - 1.24 mg/dL   Calcium 8.3 (L) 8.9 - 10.3 mg/dL   Total Protein 5.0 (L) 6.5 - 8.1 g/dL   Albumin 2.1 (L) 3.5 - 5.0 g/dL   AST 32 15 - 41 U/L   ALT 32 17 - 63 U/L   Alkaline Phosphatase 133 (H) 38 - 126 U/L   Total Bilirubin 0.4 0.3 - 1.2 mg/dL   GFR calc non Af Amer >60 >60 mL/min   GFR calc Af Amer >60 >60  mL/min    Comment: (NOTE) The eGFR has been calculated using the CKD EPI equation. This calculation has not been validated in all clinical situations. eGFR's persistently <60 mL/min signify possible Chronic Kidney Disease.    Anion gap 7 5 - 15     Lipid Panel     Component Value Date/Time   CHOL 101 06/04/2014 1523   TRIG 45.0 06/04/2014 1523   HDL 43.10 06/04/2014 1523   CHOLHDL 2 06/04/2014 1523   VLDL 9.0 06/04/2014 1523   LDLCALC 49 06/04/2014 1523     Lab Results  Component Value Date   HGBA1C 4.5* 07/16/2014   HGBA1C 5.6 12/18/2013   HGBA1C 5.3 12/14/2011     Lab Results  Component Value Date   LDLCALC 49 06/04/2014   CREATININE 0.68 01/20/2015     [name] is an 81 y.o. male male with past medical history of chronic atrial fibrillation not on anticoagulation secondary to GI bleed brought to the hospital because of increase shortness of breath and confusion.   Assessment/Plan:  Acute respiratory failure with hypoxia likely due to large pericardial effusion:/Moderate-sized left pleural effusion /Multifocal opacities in the lung, improving Workup per infectious disease for pericardial effusion/pleural effusion did not reveal any infectious etiology, no malignant cells identified in the left pleural effusion Currently it appears to be transudative in nature, doubt infectious  etiology pericardial effusion/pleural effusion Status post treatment with IV Rocephin and doxycycline for possible community-acquired pneumonia versus aspiration, rx for 7 days now . Discontinued antibiotics 10/28 Patient was found to have left pleural effusion with left lower lobe collapse on CT, s/p Thoracentesis 10/26 Repeat chest x-ray, to ensure stability was negative for acute changes on 10/29. Gram stain negative, pericardial/pleural fluid culture no growth so far, AFB culture pending Repeat 2-D echo showed an EF of 45-50% with small to moderate pericardial  effusion Hypoxemia improving currently requiring 2 L Pleural cytology shows no malignant cells await his fluid AFB Cx (ngtd) His coxsackie are positive broadly, will need f/u serologies for these.  His EBV and CMV do not suggest acute infection Discussed with Dr. Lucianne Lei DAM prior to discharge, he recommend follow-up in the outpatient clinic 2-3 weeks   Pericarditis/pericardial effusion that was bloody and with slightly high WBC, without tamponade , no evidence of inflammatory etiology , rheumatoid factor, ANA negative , serologies positive for CMV and EBV IgG Patient status post pericardiocentesis , 10/24 Doubt pyogenic bacterial purulent pericarditis ID following due to questionable infectious pericarditis?TB infection vs viral vs malignant . Requested Dr. Lucianne Lei dam  to make final recommendations Follow pleural/ pericardial fluid culture, Appreciate further input from infectious disease  2-D echo in one month    Sepsis: Probable Cap/parapneumonic effusion? Unclear etiology of source, may be respiratory versus urinary. Resolved Continue IV Rocephin,/doxycycline day #7, DC'd antibiotics  Chronic systolic heart failure: Currently compensated. Increase Lasix 40 mg twice a day.. Repeat echo shows an EF of 45-50%, continue beta blocker. Repeat 2-D echo with results as above  Acute encephalopathy with underlying diagnosis of dementia: On Namenda and Aricept likely due to infectious etiology.  Prolonged QT on EKG: Magnesium and potassium are within normal. QTC mildly improved from 580 -564.  Atrial fibrillation with RVR (HCC) Rate controlled for the most part some RVR likely from effusion. Cardiology increase metoprolol to 25 QID .. BP controlled. Not on anticoagulation due to hx GIB.Chronic Afib rate increased secondary to pericardial effusion, now improving  Dysphagia-speech therapy recommends Dysphagia 3 (mechanical soft);Thin liquid          Discharge Exam:     Blood pressure 110/46, pulse 68, temperature 98.5 F (36.9 C), temperature source Oral, resp. rate 20, height _0  (1.905 m), weight 92.6 kg (204 lb 2.3 oz), SpO2 97 %.   Gen: Weak and confused  Cardiovascular: Irregular rate and rhythm Chest and lungs: Good air movement with crackles on the right. No wheezing rails. Abdomen: Abdomen soft, NT/ND, + BS Extremities: No C/E/C   Follow-up Information    Follow up with [name], NP. Schedule an appointment as soon as possible for a visit in 1 week.   Specialty:  Family Medicine   Contact information:   [address] [place] [address] [phone]       Follow up with Dorris Carnes, MD. Schedule an appointment as soon as possible for a visit in 3 weeks.   Specialty:  Cardiology   Why:  Patient needs follow-up 2-D echo   Contact information:   [place] [address] [phone]       Follow up with Alcide Evener, MD. Schedule an appointment as soon as possible for a visit in 2 weeks.   Specialty:  Infectious Diseases   Contact information:   [address] [place] [address] [phone]  SignedReyne Dumas 01/20/2015, 10:29 AM        Time spent >45 mins

## 2015-01-20 NOTE — Progress Notes (Signed)
[facility]    Subjective: Feels great  Antibiotics:  Anti-infectives    Start     Dose/Rate Route Frequency Ordered Stop   01/10/15 2200  doxycycline (VIBRA-TABS) tablet 100 mg     100 mg Oral Every 12 hours 01/10/15 1715 01/17/15 2159   01/10/15 1730  cefTRIAXone (ROCEPHIN) 1 g in dextrose 5 % 50 mL IVPB     1 g 100 mL/hr over 30 Minutes Intravenous Every 24 hours 01/10/15 1715 01/16/15 1919   01/10/15 1415  levofloxacin (LEVAQUIN) IVPB 750 mg     750 mg 100 mL/hr over 90 Minutes Intravenous  Once 01/10/15 1402 01/10/15 1604      Medications: Scheduled Meds: . antiseptic oral rinse  7 mL Mouth Rinse BID  . aspirin  81 mg Oral Daily  . darifenacin  7.5 mg Oral Daily  . donepezil  10 mg Oral QHS  . enoxaparin (LOVENOX) injection  40 mg Subcutaneous Q24H  . furosemide  40 mg Oral BID  . gabapentin  100 mg Oral QHS  . hydrOXYzine  5 mg Oral BID  . memantine  28 mg Oral Daily  . metoprolol tartrate  25 mg Oral QID  . sodium chloride  250 mL Intravenous Once  . sodium chloride  3 mL Intravenous Q12H   Continuous Infusions:  PRN Meds:.sodium chloride, metoprolol, nitroGLYCERIN, RESOURCE THICKENUP CLEAR, sodium chloride    Objective: Weight change:   Intake/Output Summary (Last 24 hours) at 01/20/15 1315 Last data filed at 01/20/15 1038  Gross per 24 hour  Intake    340 ml  Output   1200 ml  Net   -860 ml   Blood pressure 110/46, pulse 68, temperature 98.5 F (36.9 C), temperature source Oral, resp. rate 20, height 6\' 3"  (1.905 m), weight 204 lb 2.3 oz (92.6 kg), SpO2 97 %. Temp:  [98.4 F (36.9 C)-98.5 F (36.9 C)] 98.5 F (36.9 C) (10/31 0544) Pulse Rate:  [63-72] 68 (10/31 0544) Resp:  [18-20] 20 (10/31 0544) BP: (110-117)/(46-67) 110/46 mmHg (10/31 0544) SpO2:  [97 %] 97 % (10/31 0544)  Physical Exam: General: Awake answers  questions Cardiovascular: irr, iRR no murmurs or gallops Pulmonary: fairly clear  to auscultation bilaterally, anteriorly Gastrointestinal: soft  nondistended, normal bowel sounds, Musculoskeletal: edema Skin, soft tissue: no rashes Neuro: nonfocal  CBC: CBC Latest Ref Rng 01/18/2015 01/16/2015 01/15/2015  WBC 4.0 - 10.5 K/uL 4.7 4.7 5.8  Hemoglobin 13.0 - 17.0 g/dL 10.4(L) 10.1(L) 10.4(L)  Hematocrit 39.0 - 52.0 % 32.8(L) 33.3(L) 34.1(L)  Platelets 150 - 400 K/uL 248 240 246       BMET  Recent Labs  01/18/15 1115 01/20/15 0426  NA 142 138  K 3.9 4.1  CL 103 100*  CO2 31 31  GLUCOSE 121* 94  BUN 11 13  CREATININE 0.74 0.68  CALCIUM 8.7* 8.3*     Liver Panel   Recent Labs  01/18/15 0344 01/20/15 0426  PROT 4.8* 5.0*  ALBUMIN 2.1* 2.1*  AST 31 32  ALT 30 32  ALKPHOS 134* 133*  BILITOT 0.4 0.4       Sedimentation Rate No results for input(s): ESRSEDRATE in the last 72 hours. C-Reactive Protein No results for input(s): CRP in the last 72 hours.  Micro Results: Recent Results (from the past 720 hour(s))  Culture, Urine     Status: None   Collection Time: 01/10/15  2:24 PM  Result Value Ref Range Status   Specimen Description URINE, RANDOM  Final   Special Requests NONE  Final   Culture MULTIPLE SPECIES PRESENT, SUGGEST RECOLLECTION  Final   Report Status 01/12/2015 FINAL  Final  Culture, body fluid-bottle     Status: None   Collection Time: 01/13/15 11:44 AM  Result Value Ref Range Status   Specimen Description FLUID PERITONEAL  Final   Special Requests BAA 10MLS  Final   Culture NO GROWTH 5 DAYS  Final   Report Status 01/18/2015 FINAL  Final  Gram stain     Status: None   Collection Time: 01/13/15 11:44 AM  Result Value Ref Range Status   Specimen Description FLUID PERITONEAL  Final   Special Requests NONE  Final   Gram Stain   Final    MODERATE WBC PRESENT,BOTH PMN AND MONONUCLEAR NO ORGANISMS SEEN    Report Status 01/13/2015 FINAL  Final  AFB culture with smear     Status: None (Preliminary result)   Collection  Time: 01/13/15 12:49 PM  Result Value Ref Range Status   Specimen Description PERICARDIAL  Final   Special Requests Immunocompromised  Final   Acid Fast Smear   Final    NO ACID FAST BACILLI SEEN Performed at [facility]    Culture   Final    CULTURE WILL BE EXAMINED FOR 6 WEEKS BEFORE ISSUING A FINAL REPORT Performed at [facility]    Report Status PENDING  Incomplete  MRSA PCR Screening     Status: None   Collection Time: 01/14/15 10:50 AM  Result Value Ref Range Status   MRSA by PCR NEGATIVE NEGATIVE Final    Comment:        The GeneXpert MRSA Assay (FDA approved for NASAL specimens only), is one component of a comprehensive MRSA colonization surveillance program. It is not intended to diagnose MRSA infection nor to guide or monitor treatment for MRSA infections.   Culture, body fluid-bottle     Status: None (Preliminary result)   Collection Time: 01/15/15  3:36 PM  Result Value Ref Range Status   Specimen Description FLUID PLEURAL LEFT  Final   Special Requests NONE  Final   Culture NO GROWTH 4 DAYS  Final   Report Status PENDING  Incomplete  Gram stain     Status: None   Collection Time: 01/15/15  3:36 PM  Result Value Ref Range Status   Specimen Description FLUID PLEURAL LEFT  Final   Special Requests NONE  Final   Gram Stain   Final    MODERATE WBC PRESENT,BOTH PMN AND MONONUCLEAR NO ORGANISMS SEEN    Report Status 01/15/2015 FINAL  Final  AFB culture with smear     Status: None (Preliminary result)   Collection Time: 01/15/15  3:36 PM  Result Value Ref Range Status   Specimen Description FLUID PLEURAL LEFT  Final   Special Requests NONE  Final   Acid Fast Smear   Final    NO ACID FAST BACILLI SEEN Performed at [facility]    Culture   Final    CULTURE WILL BE EXAMINED FOR 6 WEEKS BEFORE ISSUING A FINAL REPORT Performed at [facility]    Report Status PENDING  Incomplete    Studies/Results: No results  found.    Assessment/Plan:  INTERVAL HISTORY:  01/15/15 transferred to [facility] for airborne 01/16/15: resp therapy gave hypertonic saline x 3 but could not obtain an expectorated sputum after induction x  3  Principal Problem:   Pericardial effusion with cardiac tamponade Active Problems:   Atrial fibrillation (HCC)   Dementia   CAP (community acquired pneumonia)   Prolonged Q-T interval on ECG   Acute respiratory failure (HCC)   Chronic anemia   Chronic systolic (congestive) heart failure (HCC)   Acute encephalopathy   Atrial fibrillation with RVR (HCC)   Pericardial effusion without cardiac tamponade   Acute pericarditis   S/P thoracentesis    [name] is a 81 y.o. male  wth admission for dysnea hypoxia found to have large pericardial effusion, pleural effusions left lobe collpase and multifocal pathology in the lungs  #1 Multifocal opacities in the lung with pleural effusion, pericardial effusion:  --given the concern raised for TB we attempted to obtain sputa x 3 for AFB culture  But patient could nto produce sputum --My suspicion for TB was  not high enough for me to want to go to more aggressive measures such as asking pulmonary to perform bronchoscopies I think we have done due diligence to exclude active pulmonary TB and I  discontinued his airborne precautions  He has finished rx for CAP  #2 Pericarditis with effusion that was bloody and with slightly high WBC. The wBC in the pleural fluid was not what one would expect to find with a pyogenic bacterial purulent pericarditis. It is c/w possible TB infection vs viral vs malignant (esp given bloody appearance) vs rheumatologic or other inflammatory cause  AFB smear from pericardial fluid is negative  Coxsackie virus ab not esp high, echo virus pending  MTB PCR pending  My suspicion for MTB is LOW and he is much improved. I would not hold him here for further ID workup   #3 Pleural effusion: they seem much more  transudative in nature but prior to seeing the chemistries I did also add AFB stain and culture negative. MTB PCR from pleural fluid pendingAdenosine Deaminase form fluid  #4 Dementia: this is more likely the cause for his failure to thrive but we should work him up for his pericarditis   He can be DC from my point of view and followup with [place] in the next 3 weeks to re-assess his serologies. IF something such as an AFB cx or PCR comes back + we will get him on rx but again my suspicion is VERY low at this point.     LOS: 10 days   Alcide Evener 01/20/2015, 1:15 PM

## 2015-01-20 NOTE — Progress Notes (Signed)
Patient was discharged home by MD order; discharged instructions review and give to patient with care notes; IV DIC; oxygen provided; patient will be escorted to the car by nurse tech via wheelchair.

## 2015-01-22 ENCOUNTER — Telehealth: Payer: Self-pay | Admitting: Cardiology

## 2015-01-22 ENCOUNTER — Telehealth: Payer: Self-pay

## 2015-01-22 DIAGNOSIS — I509 Heart failure, unspecified: Secondary | ICD-10-CM | POA: Diagnosis not present

## 2015-01-22 DIAGNOSIS — F0390 Unspecified dementia without behavioral disturbance: Secondary | ICD-10-CM | POA: Diagnosis not present

## 2015-01-22 DIAGNOSIS — R2689 Other abnormalities of gait and mobility: Secondary | ICD-10-CM | POA: Diagnosis not present

## 2015-01-22 DIAGNOSIS — I482 Chronic atrial fibrillation: Secondary | ICD-10-CM | POA: Diagnosis not present

## 2015-01-22 DIAGNOSIS — I10 Essential (primary) hypertension: Secondary | ICD-10-CM | POA: Diagnosis not present

## 2015-01-22 DIAGNOSIS — E1140 Type 2 diabetes mellitus with diabetic neuropathy, unspecified: Secondary | ICD-10-CM | POA: Diagnosis not present

## 2015-01-22 LAB — CHOLESTEROL, BODY FLUID: CHOL FL: 43 mg/dL

## 2015-01-22 NOTE — Telephone Encounter (Signed)
New message      After recent hosp---restarting PT in home.  Bp at rest 104/60 no change with standing; HR very irregular, pulse approx 94 at rest.  Pt has new medication from hosp---family has not filled new dosage (will get it filled later) of metoprolol----it was 12.5mg  and new is 25mg  qid.  Want request for verbal orders for skilled nursing evaluation for medication management and disease management. Patient  demostrates moderate fatigue at rest.

## 2015-01-22 NOTE — Telephone Encounter (Signed)
Left message to call back  

## 2015-01-22 NOTE — Telephone Encounter (Signed)
[name] from [facility] called and states after recent hospitalization return to care at home.  Pt's vitals are BP: 104/60, HR: 94. Pt's cardiologist has been alerted. Pt has an irregular heart rate.  Pt has is transitioning to new Bp medication metoprolol.  He was previously on 12. 5 mg and now he is on 25 mg 4 times daily.  Pt is now on oxygen therapy at 2L per minute.  [name] needs to know if this should be prn with stats at 21 or above or continuous? At rest pt is about 72 with complaints of fatigue and not having the desire to walk.  Needs verbal orders for medication management and disease management. Pt was discharged with a catheter as well and needs assistance with proper usage.  Pt currently has PT, OT, social. Pls advise.

## 2015-01-23 NOTE — Telephone Encounter (Signed)
[facility] for medication management and disease management as well. Called and spoke with [name] and he is aware.

## 2015-01-23 NOTE — Telephone Encounter (Signed)
Ok for continuous oxygen therpay

## 2015-01-26 DIAGNOSIS — E1140 Type 2 diabetes mellitus with diabetic neuropathy, unspecified: Secondary | ICD-10-CM | POA: Diagnosis not present

## 2015-01-26 DIAGNOSIS — I10 Essential (primary) hypertension: Secondary | ICD-10-CM | POA: Diagnosis not present

## 2015-01-26 DIAGNOSIS — F0390 Unspecified dementia without behavioral disturbance: Secondary | ICD-10-CM | POA: Diagnosis not present

## 2015-01-26 DIAGNOSIS — R2689 Other abnormalities of gait and mobility: Secondary | ICD-10-CM | POA: Diagnosis not present

## 2015-01-26 DIAGNOSIS — I509 Heart failure, unspecified: Secondary | ICD-10-CM | POA: Diagnosis not present

## 2015-01-26 DIAGNOSIS — I482 Chronic atrial fibrillation: Secondary | ICD-10-CM | POA: Diagnosis not present

## 2015-01-27 ENCOUNTER — Telehealth: Payer: Self-pay | Admitting: Cardiology

## 2015-01-27 ENCOUNTER — Telehealth: Payer: Self-pay

## 2015-01-27 DIAGNOSIS — I482 Chronic atrial fibrillation: Secondary | ICD-10-CM | POA: Diagnosis not present

## 2015-01-27 DIAGNOSIS — I509 Heart failure, unspecified: Secondary | ICD-10-CM | POA: Diagnosis not present

## 2015-01-27 DIAGNOSIS — R2689 Other abnormalities of gait and mobility: Secondary | ICD-10-CM | POA: Diagnosis not present

## 2015-01-27 DIAGNOSIS — E1140 Type 2 diabetes mellitus with diabetic neuropathy, unspecified: Secondary | ICD-10-CM | POA: Diagnosis not present

## 2015-01-27 DIAGNOSIS — F0390 Unspecified dementia without behavioral disturbance: Secondary | ICD-10-CM | POA: Diagnosis not present

## 2015-01-27 DIAGNOSIS — I10 Essential (primary) hypertension: Secondary | ICD-10-CM | POA: Diagnosis not present

## 2015-01-27 LAB — MTB NAA WITHOUT AFB CULTURE: M Tuberculosis, Naa: NEGATIVE

## 2015-01-27 NOTE — Telephone Encounter (Signed)
New message   Please call chris

## 2015-01-27 NOTE — Telephone Encounter (Signed)
Spoke with son and patient is taking Lasix 40 mg twice a day When he takes his second dose keeps him up at night Also, patient d/c home on Metoprolol 25 mg 4 times when he went in the hospital he was taking 12.5 mg daily Son stated he is only giving his dad the Metoprolol 25 mg twice a day. Today blood pressure 108/66 HR 61 Discussed with  [name] and ok to give Lasix 80 mg in the morning and continue Metoprolol 25 mg twice a day  Advised son, verbalized understanding

## 2015-01-27 NOTE — Telephone Encounter (Signed)
PA initiated via covermymeds. DEY:[id number]

## 2015-01-27 NOTE — Telephone Encounter (Signed)
New message      Pt was recently discharged from [facility].  Talk to the nurse about his discharge medication list

## 2015-01-28 ENCOUNTER — Telehealth: Payer: Self-pay | Admitting: Neurology

## 2015-01-28 ENCOUNTER — Ambulatory Visit (INDEPENDENT_AMBULATORY_CARE_PROVIDER_SITE_OTHER): Payer: Medicare Other | Admitting: Adult Health

## 2015-01-28 ENCOUNTER — Ambulatory Visit: Payer: Medicare Other | Admitting: Adult Health

## 2015-01-28 ENCOUNTER — Encounter: Payer: Self-pay | Admitting: Adult Health

## 2015-01-28 VITALS — BP 120/80 | HR 89 | Temp 97.3°F | Wt 203.4 lb

## 2015-01-28 DIAGNOSIS — Z09 Encounter for follow-up examination after completed treatment for conditions other than malignant neoplasm: Secondary | ICD-10-CM

## 2015-01-28 DIAGNOSIS — J189 Pneumonia, unspecified organism: Secondary | ICD-10-CM

## 2015-01-28 DIAGNOSIS — R531 Weakness: Secondary | ICD-10-CM

## 2015-01-28 MED ORDER — MEMANTINE HCL 10 MG PO TABS: 10.0000 mg | ORAL_TABLET | Freq: Two times a day (BID) | ORAL | Status: DC

## 2015-01-28 NOTE — Telephone Encounter (Signed)
[name] calling re update on lasix's-was told to take both pills at one time versus 6 hours apart and to call if dizzy-pt has been dizzy today as well as fatigue-BP 130-74 pulse 95 at 12p today--pls advise son Gerald Stabs [phone]

## 2015-01-28 NOTE — Telephone Encounter (Signed)
Agree with plan 

## 2015-01-28 NOTE — Progress Notes (Signed)
Pre visit review using our clinic review tool, if applicable. No additional management support is needed unless otherwise documented below in the visit note. 

## 2015-01-28 NOTE — Telephone Encounter (Signed)
Daughter Twana First [phone] called to request replacing memantine (NAMENDA XR) 28 MG CP24 24 hr capsule with a Memantine product since it is considerably cheaper.

## 2015-01-28 NOTE — Patient Instructions (Signed)
It was great seeing you again and I am happy to see that you are out of the hospital   Continue to eat and drink. Walk with your walker as much as you can.   I will follow up with you regarding your lab work.

## 2015-01-28 NOTE — Telephone Encounter (Signed)
Ok to change to generic Namenda 10 mg twice a day, I have called new prescription to his pharmacy, 60 tablets, with 11 refills

## 2015-01-28 NOTE — Progress Notes (Signed)
Subjective:    Patient ID: [name], male    DOB: 08-17-1927, 81 y.o.   MRN: [id number]  HPI 81 year old male with  has a past medical history of Arrhythmia; ED (erectile dysfunction); Memory disorder; Anemia; Hematochezia; Arthritis; Restless leg; Diabetes mellitus; Genital warts; GERD (gastroesophageal reflux disease); Hypertension; Neuropathy ([place]); Irritable bladder; Hard of hearing; and Bladder cancer ([place]). presents with his son and their home health aid for post hospital follow up. Kalil spent 10 days in the hospital after presenting to the ER on 01/10/2015 after being found by caregiver today to be breathing rapidly and shallowly. EMS was called and patient was hypoxic on room air to 80%. This improved on nasal cannula.  Chest xray in the ER showed    IMPRESSION: Opacification over the left base/ retrocardiac region which may be due to effusion/atelectasis although cannot exclude infection. PA and lateral chest x-ray would be better for evaluation of the left base as recommend follow-up to resolution.  Marked cardiomegaly slightly worse as cannot exclude a component of pericardial fluid.  He was admitted for Sepsis, ARF and suspected PNA , he was started empirically on IV Rocephen and doxycycline.  A TEE was performed which revealed a large circumferential effusion with no clear findings of tamponade.A Pericardiocentesis was performed on 01/13/2015, 1250 ml of fluid was removed.   CTA on 01/14/2015 showed  Small to moderate pericardial effusion. Biatrial enlargement with changes of right heart failure. Moderate layering left pleural effusion with near complete left lower lobe collapse and Multi focal airspace disease in the right lung.   His pleural effusion was tapped yielded 1.1 liters of serous fluid.   He was discharged on 01/20/2015 with recommended follow up with PCP in 3-5 days.    Acute respiratory failure with hypoxia likely due to large pericardial  effusion:/Moderate-sized left pleural effusion /Multifocal opacities in the lung, improving Workup per infectious disease for pericardial effusion/pleural effusion did not reveal any infectious etiology, no malignant cells identified in the left pleural effusion Currently it appears to be transudative in nature, doubt infectious etiology pericardial effusion/pleural effusion Status post treatment with IV Rocephin and doxycycline for possible community-acquired pneumonia versus aspiration, rx for 7 days now . Discontinued antibiotics 10/28 Patient was found to have left pleural effusion with left lower lobe collapse on CT, s/p Thoracentesis 10/26  Pericarditis/pericardial effusion that was bloody and with slightly high WBC, without tamponade , no evidence of inflammatory etiology , rheumatoid factor, ANA negative , serologies positive for CMV and EBV IgG Patient status post pericardiocentesis , 10/24 Doubt pyogenic bacterial purulent pericarditis ID following due to questionable infectious pericarditis?TB infection vs viral vs malignant .  Sepsis: Probable Cap/parapneumonic effusion? Unclear etiology of source, may be respiratory versus urinary. Resolved Continue IV Rocephin,/doxycycline day #7, DC'd antibiotics    Today in the office his family reports that since he has been home that he has been very fatigued. Yesterday he had much more energy then today. Yesterday he was up walking around but that today he has been using his wheelchair throughout the day. His appetitie is good and his cognition is at baseline for the patient. He is alert and oriented to person and place. He does not know who the current president is but states " it better not be Trump."   He is unable to preform ADLS/IADLS. He has a home health aid at his house 24 hours a day.   He is currently on 2 L oxygen via  [place]. This is adequate for him while at rest but as soon as he is up moving around he requires 3-4 liters.    Family denies any fevers, n/v/d since being discharged from the hospital.   Review of Systems  Reason unable to perform ROS: Famiuly provided much of this information due to patients fatigue, weakness and dementia.  Constitutional: Positive for activity change and fatigue. Negative for fever, chills and appetite change.  Eyes: Negative.   Respiratory: Positive for cough and shortness of breath. Negative for chest tightness, wheezing and stridor.   Cardiovascular: Negative.   Gastrointestinal: Negative.   Genitourinary: Positive for urgency and frequency.  Musculoskeletal: Positive for gait problem. Negative for joint swelling.  Skin: Negative.    Past Medical History  Diagnosis Date  . Arrhythmia     a fib  . ED (erectile dysfunction)   . Memory disorder   . 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 Hematochezia     has intermittent problem  . Arthritis     DJD knee - left  . Restless leg     treats with heat  . Diabetes mellitus     NIDDM on oral medication  . Genital warts     recurrent. Not a problem  . GERD (gastroesophageal reflux disease)     treated with PPI  . Hypertension     mild  . Neuropathy (HCC)     burning discomfort feet/legs  . Irritable bladder   . Hard of hearing   . Bladder cancer ([place])     BCG [name]    Social History   Social History  . Marital Status: Married    Spouse Name: [name]  . Number of Children: 4  . Years of Education: 10th    Occupational History  . retired    Social History Main Topics  . Smoking status: Former Smoker    Quit date: 08/28/1975  . Smokeless tobacco: Former Systems developer  . Alcohol Use: 4.2 oz/week    7 Shots of liquor per week  . Drug Use: No  . Sexual Activity: Not Currently   Other Topics Concern  . Not on file   Social History Narrative   Patient lives at home with his wife [name]) Married 69 years.   Retired   Education 10 th grade   Right handed   Caffeine one or two daily      Past Surgical History  Procedure Laterality Date  . Cardiovascular stress test  03/28/2003    EF 55%.  Normal cardiolite study  . Appendectomy  1946  . Tonsillectomy  1938  . Joint replacement  1990's    left TKR  . Knee arthroscopy  1980's    right knee  . Cardiac catheterization N/A 01/13/2015    Procedure: Pericardiocentesis;  Surgeon: Leonie Man, MD;  Location: [facility];  Service: Cardiovascular;  Laterality: N/A;    Family History  Problem Relation Age of Onset  . Kidney disease Mother   . Heart disease Mother   . Stroke Mother   . Cancer Father     prostate  . Diabetes Neg Hx   . COPD Neg Hx   . Mental illness Brother     Allergies  Allergen Reactions  . Cephalexin Other (See Comments)    Unknown reaction  . Furacin [Nitrofurazone] Other (See Comments)    Unknown reaction  . Macrodantin Other (See Comments)    Unknown  reaction  . Zestril [Lisinopril] Other (See Comments)    Unknown reaction    Current Outpatient Prescriptions on File Prior to Visit  Medication Sig Dispense Refill  . aspirin 81 MG tablet Take 81 mg by mouth daily.    [name] donepezil (ARICEPT) 10 MG tablet Take 1 tablet (10 mg total) by mouth at bedtime. 90 tablet 3  . ferrous sulfate 325 (65 FE) MG tablet Take 487.5 mg by mouth 2 (two) times daily.     . folic acid (FOLVITE) 1 MG tablet Take 1 mg by mouth 2 (two) times daily.     . furosemide (LASIX) 40 MG tablet Take 80 mg by mouth daily.    [name] gabapentin (NEURONTIN) 100 MG capsule Take 100 mg by mouth at bedtime.    . hydrOXYzine (ATARAX/VISTARIL) 10 MG tablet TAKE (1) TABLET TWICE A DAY AS NEEDED. (Patient taking differently: TAKE 5 MG BY MOUTH TWICE DAILY) 60 tablet 0  . memantine (NAMENDA XR) 28 MG CP24 24 hr capsule Take 1 capsule (28 mg total) by mouth daily. 90 capsule 3  . metoprolol tartrate (LOPRESSOR) 25 MG tablet Take 25 mg by mouth 2 (two) times daily.    . Multiple Vitamin (MULTIVITAMIN WITH MINERALS) TABS tablet Take  1 tablet by mouth daily.    . Multiple Vitamins-Minerals (PRESERVISION AREDS 2 PO) Take 1 tablet by mouth 2 (two) times daily.     . nitroGLYCERIN (NITROSTAT) 0.4 MG SL tablet Place 1 tablet (0.4 mg total) under the tongue every 5 (five) minutes as needed for chest pain. 100 tablet 0  . solifenacin (VESICARE) 5 MG tablet Take 1 tablet (5 mg total) by mouth daily. 90 tablet 3  . triamcinolone cream (KENALOG) 0.1 % Apply 1 application topically 2 (two) times daily. (Patient not taking: Reported on 01/10/2015) 30 g 0   No current facility-administered medications on file prior to visit.    BP 120/80 mmHg  Pulse 89  Temp(Src) 97.3 F (36.3 C) (Oral)  Wt 203 lb 6.4 oz (92.262 kg)       Objective:   Physical Exam  Constitutional: He appears well-developed and well-nourished. He appears distressed.  Appears fatigued and worn out but in good spirits   Eyes: Conjunctivae and EOM are normal. Pupils are equal, round, and reactive to light. Right eye exhibits no discharge. Left eye exhibits no discharge.  Neck: Normal range of motion. Neck supple. No thyromegaly present.  Cardiovascular: Normal heart sounds and intact distal pulses.  Exam reveals no gallop and no friction rub.   No murmur heard. Irregularly irregular   Pulmonary/Chest: Effort normal and breath sounds normal. No respiratory distress. He has no wheezes. He has no rales. He exhibits no tenderness.  2 L via [place]  Abdominal: Soft. Bowel sounds are normal. He exhibits no distension and no mass. There is no tenderness. There is no rebound and no guarding.  Musculoskeletal: He exhibits no edema or tenderness.  Is able to stand out of wheel chair with two person assist  Lymphadenopathy:    He has no cervical adenopathy.  Neurological: He is alert. He has normal reflexes.  Person and place  Skin: Skin is warm and dry. No rash noted. He is not diaphoretic. No erythema. No pallor.  No sacral pressure ulcers  Psychiatric: He has a normal  mood and affect. His behavior is normal. Judgment and thought content normal.  Nursing note and vitals reviewed.     Assessment & Plan:  1. Hospital discharge  follow-up - He continues to be weak following discharge. I am confident that as he recuperates and works with PT he will be able to return to his baseline.  - Basic metabolic panel - CBC with Differential/Platelet - Follow up in three weeks or sooner if needed.  - Follow up with Cardiology and ID as scheduled.  - 2. CAP (community acquired pneumonia) - Appears to have resolved  - Basic metabolic panel - CBC with Differential/Platelet - Follow up with any increased SOB, fever, productive cough 3. Generalized weakness - Activity as tolerated - Stay well hydrated and eat high quality foods - Basic metabolic panel - CBC with Differential/Platelet

## 2015-01-28 NOTE — Telephone Encounter (Signed)
Patient is requesting to change from Namenda XR to generic Memantine 10mg  twice daily due to cost.  Okay to change?   Please advise.  Thank you.

## 2015-01-28 NOTE — Telephone Encounter (Signed)
I called back to advise of change.  They expressed understanding and appreciation.

## 2015-01-28 NOTE — Telephone Encounter (Signed)
Spoke with son who is reporting dizziness today after taking Furosemide tablets together today.  They will go back to taking them 6 hrs apart tomorrow.   Will forward to Dr Mare Ferrari and Rip Harbour for their knowledge

## 2015-01-29 ENCOUNTER — Encounter: Payer: Self-pay | Admitting: *Deleted

## 2015-01-29 ENCOUNTER — Telehealth: Payer: Self-pay

## 2015-01-29 DIAGNOSIS — I10 Essential (primary) hypertension: Secondary | ICD-10-CM | POA: Diagnosis not present

## 2015-01-29 DIAGNOSIS — I509 Heart failure, unspecified: Secondary | ICD-10-CM | POA: Diagnosis not present

## 2015-01-29 DIAGNOSIS — R2689 Other abnormalities of gait and mobility: Secondary | ICD-10-CM | POA: Diagnosis not present

## 2015-01-29 DIAGNOSIS — F0390 Unspecified dementia without behavioral disturbance: Secondary | ICD-10-CM | POA: Diagnosis not present

## 2015-01-29 DIAGNOSIS — I482 Chronic atrial fibrillation: Secondary | ICD-10-CM | POA: Diagnosis not present

## 2015-01-29 DIAGNOSIS — E1140 Type 2 diabetes mellitus with diabetic neuropathy, unspecified: Secondary | ICD-10-CM | POA: Diagnosis not present

## 2015-01-29 LAB — CBC WITH DIFFERENTIAL/PLATELET
Basophils Absolute: 0 10*3/uL (ref 0.0–0.1)
Basophils Relative: 0.6 % (ref 0.0–3.0)
EOS PCT: 6.5 % — AB (ref 0.0–5.0)
Eosinophils Absolute: 0.5 10*3/uL (ref 0.0–0.7)
HCT: 38 % — ABNORMAL LOW (ref 39.0–52.0)
Hemoglobin: 11.9 g/dL — ABNORMAL LOW (ref 13.0–17.0)
LYMPHS ABS: 0.7 10*3/uL (ref 0.7–4.0)
Lymphocytes Relative: 8.9 % — ABNORMAL LOW (ref 12.0–46.0)
MCHC: 31.4 g/dL (ref 30.0–36.0)
MCV: 79.3 fl (ref 78.0–100.0)
MONO ABS: 0.8 10*3/uL (ref 0.1–1.0)
MONOS PCT: 10.5 % (ref 3.0–12.0)
NEUTROS ABS: 5.4 10*3/uL (ref 1.4–7.7)
NEUTROS PCT: 73.5 % (ref 43.0–77.0)
PLATELETS: 280 10*3/uL (ref 150.0–400.0)
RBC: 4.79 Mil/uL (ref 4.22–5.81)
RDW: 18.5 % — AB (ref 11.5–15.5)
WBC: 7.4 10*3/uL (ref 4.0–10.5)

## 2015-01-29 LAB — BASIC METABOLIC PANEL
BUN: 15 mg/dL (ref 6–23)
CO2: 35 meq/L — AB (ref 19–32)
Calcium: 9.2 mg/dL (ref 8.4–10.5)
Chloride: 95 mEq/L — ABNORMAL LOW (ref 96–112)
Creatinine, Ser: 0.92 mg/dL (ref 0.40–1.50)
GFR: 82.95 mL/min (ref >60.00)
GLUCOSE: 162 mg/dL — AB (ref 70–99)
POTASSIUM: 4.4 meq/L (ref 3.5–5.1)
SODIUM: 137 meq/L (ref 135–145)

## 2015-01-29 NOTE — Telephone Encounter (Signed)
Discussed with  [name] and ok to decrease Metoprolol to 25 mg 1/2 tablet twice a day  Left message on sons machine and sent message in [place]

## 2015-01-29 NOTE — Telephone Encounter (Signed)
Patient's son Trino Higinbotham) called concerned that his dad is sleeping a lot lately.  He recently was in the hospital.  Gerald Stabs is asking if the Metoprolol or the Lasix is making the patient extra sleepy.  Yesterday his BP was 130/74. He also is complaining of dizziness.  Gerald Stabs is asking if he can cut the patient's Metoprolol in half (1/2 tablet in the morning and 1/2 tablet in the evening) to see if [place]on would not sleep so much.  Son Gerald Stabs) is asking for a call back from you at [phone].

## 2015-01-30 ENCOUNTER — Telehealth: Payer: Self-pay | Admitting: Adult Health

## 2015-01-30 NOTE — Telephone Encounter (Signed)
Per [facility] labs look fine; they are at baseline.  Called and spoke with pt's son and he is aware of lab results.

## 2015-01-30 NOTE — Telephone Encounter (Signed)
Order for wheelchair resent to fax number provided. Called and left a message for [name] that fax was sent again. Left a message for return call if needed.

## 2015-01-30 NOTE — Telephone Encounter (Signed)
Son states he would like a call back with pt's lab results.

## 2015-01-30 NOTE — Telephone Encounter (Signed)
Spoke with [name] and patients pill box was fixed incorrectly Patient had taken extra Lasix and Metoprolol  Explained to son patient was evaluated on 11/8 and labs were ok not to be concerned at this point Advised to resume medications as previously directed and then if his dad continued to seem to be sleeping all the time consider decreasing to Metoprolol 25 mg 1/2 tablet twice a day  Also need to monitor blood pressure and heart rate during this time. If systolic blood pressure below 100 or heart rate below 60 call back  Son verbalized understanding.

## 2015-01-30 NOTE — Telephone Encounter (Signed)
Discussed with  [name] and he agrees with plan

## 2015-01-30 NOTE — Telephone Encounter (Signed)
Agree with plan 

## 2015-01-30 NOTE — Telephone Encounter (Signed)
Fax [phone]  Attn:Lee   [facility] did receive the order for pt's wheelchair , but they need the office notes to go with that order. pls fax

## 2015-01-31 DIAGNOSIS — R2689 Other abnormalities of gait and mobility: Secondary | ICD-10-CM | POA: Diagnosis not present

## 2015-01-31 DIAGNOSIS — I10 Essential (primary) hypertension: Secondary | ICD-10-CM | POA: Diagnosis not present

## 2015-01-31 DIAGNOSIS — I482 Chronic atrial fibrillation: Secondary | ICD-10-CM | POA: Diagnosis not present

## 2015-01-31 DIAGNOSIS — F0390 Unspecified dementia without behavioral disturbance: Secondary | ICD-10-CM | POA: Diagnosis not present

## 2015-01-31 DIAGNOSIS — I509 Heart failure, unspecified: Secondary | ICD-10-CM | POA: Diagnosis not present

## 2015-01-31 DIAGNOSIS — E1140 Type 2 diabetes mellitus with diabetic neuropathy, unspecified: Secondary | ICD-10-CM | POA: Diagnosis not present

## 2015-02-03 ENCOUNTER — Telehealth: Payer: Self-pay | Admitting: Cardiology

## 2015-02-03 DIAGNOSIS — R2689 Other abnormalities of gait and mobility: Secondary | ICD-10-CM | POA: Diagnosis not present

## 2015-02-03 DIAGNOSIS — I10 Essential (primary) hypertension: Secondary | ICD-10-CM | POA: Diagnosis not present

## 2015-02-03 DIAGNOSIS — E1140 Type 2 diabetes mellitus with diabetic neuropathy, unspecified: Secondary | ICD-10-CM | POA: Diagnosis not present

## 2015-02-03 DIAGNOSIS — I509 Heart failure, unspecified: Secondary | ICD-10-CM | POA: Diagnosis not present

## 2015-02-03 DIAGNOSIS — I482 Chronic atrial fibrillation: Secondary | ICD-10-CM | POA: Diagnosis not present

## 2015-02-03 DIAGNOSIS — F0390 Unspecified dementia without behavioral disturbance: Secondary | ICD-10-CM | POA: Diagnosis not present

## 2015-02-03 NOTE — Telephone Encounter (Signed)
Spoke with patients wife and caregiver with him, blood pressure 130/61 HR 41 earlier. Checked HR and now 37 Patient has been taking Metoprolol 25 mg 1 tablet twice a day Discussed with  [name] and will have patient decrease Metoprolol to 25 mg 1/2 tablet twice a day Advised wife and caregiver Spoke with Caitlyn and left [name] a message

## 2015-02-03 NOTE — Telephone Encounter (Signed)
New Message    Pt's Bp 110/70 standing 100/60 w/ significantly increased irregular beats. Caregiver reported earlier today while walking 10 ft pt reported blacking out.

## 2015-02-03 NOTE — Telephone Encounter (Signed)
Follow up      114/66 sitting bp, 98/68 when pt stood up.  Calling to let [place] to know.  Pt has an appt on thurs.  Want to make you aware of what was going on.

## 2015-02-04 DIAGNOSIS — I482 Chronic atrial fibrillation: Secondary | ICD-10-CM | POA: Diagnosis not present

## 2015-02-04 DIAGNOSIS — F0390 Unspecified dementia without behavioral disturbance: Secondary | ICD-10-CM | POA: Diagnosis not present

## 2015-02-04 DIAGNOSIS — I509 Heart failure, unspecified: Secondary | ICD-10-CM | POA: Diagnosis not present

## 2015-02-04 DIAGNOSIS — I10 Essential (primary) hypertension: Secondary | ICD-10-CM | POA: Diagnosis not present

## 2015-02-04 DIAGNOSIS — R2689 Other abnormalities of gait and mobility: Secondary | ICD-10-CM | POA: Diagnosis not present

## 2015-02-04 DIAGNOSIS — E1140 Type 2 diabetes mellitus with diabetic neuropathy, unspecified: Secondary | ICD-10-CM | POA: Diagnosis not present

## 2015-02-05 DIAGNOSIS — I482 Chronic atrial fibrillation: Secondary | ICD-10-CM | POA: Diagnosis not present

## 2015-02-05 DIAGNOSIS — R2689 Other abnormalities of gait and mobility: Secondary | ICD-10-CM | POA: Diagnosis not present

## 2015-02-05 DIAGNOSIS — I509 Heart failure, unspecified: Secondary | ICD-10-CM | POA: Diagnosis not present

## 2015-02-05 DIAGNOSIS — F0390 Unspecified dementia without behavioral disturbance: Secondary | ICD-10-CM | POA: Diagnosis not present

## 2015-02-05 DIAGNOSIS — I10 Essential (primary) hypertension: Secondary | ICD-10-CM | POA: Diagnosis not present

## 2015-02-05 DIAGNOSIS — E1140 Type 2 diabetes mellitus with diabetic neuropathy, unspecified: Secondary | ICD-10-CM | POA: Diagnosis not present

## 2015-02-06 ENCOUNTER — Encounter: Payer: Self-pay | Admitting: Cardiology

## 2015-02-06 ENCOUNTER — Ambulatory Visit (INDEPENDENT_AMBULATORY_CARE_PROVIDER_SITE_OTHER): Payer: Medicare Other | Admitting: Cardiology

## 2015-02-06 VITALS — BP 116/62 | HR 88 | Ht 75.0 in | Wt 203.0 lb

## 2015-02-06 DIAGNOSIS — I10 Essential (primary) hypertension: Secondary | ICD-10-CM | POA: Diagnosis not present

## 2015-02-06 DIAGNOSIS — I482 Chronic atrial fibrillation: Secondary | ICD-10-CM | POA: Diagnosis not present

## 2015-02-06 DIAGNOSIS — I319 Disease of pericardium, unspecified: Secondary | ICD-10-CM

## 2015-02-06 DIAGNOSIS — I509 Heart failure, unspecified: Secondary | ICD-10-CM | POA: Diagnosis not present

## 2015-02-06 DIAGNOSIS — I5033 Acute on chronic diastolic (congestive) heart failure: Secondary | ICD-10-CM

## 2015-02-06 DIAGNOSIS — I4891 Unspecified atrial fibrillation: Secondary | ICD-10-CM

## 2015-02-06 DIAGNOSIS — F0390 Unspecified dementia without behavioral disturbance: Secondary | ICD-10-CM | POA: Diagnosis not present

## 2015-02-06 DIAGNOSIS — E1140 Type 2 diabetes mellitus with diabetic neuropathy, unspecified: Secondary | ICD-10-CM | POA: Diagnosis not present

## 2015-02-06 DIAGNOSIS — I313 Pericardial effusion (noninflammatory): Secondary | ICD-10-CM

## 2015-02-06 DIAGNOSIS — R2689 Other abnormalities of gait and mobility: Secondary | ICD-10-CM | POA: Diagnosis not present

## 2015-02-06 DIAGNOSIS — I3139 Other pericardial effusion (noninflammatory): Secondary | ICD-10-CM

## 2015-02-06 LAB — MISC LABCORP TEST (SEND OUT)

## 2015-02-06 NOTE — Patient Instructions (Signed)
Medication Instructions:  The current medical regimen is effective;  continue present plan and medications.  Testing/Procedures: Your physician has requested that you have an echocardiogram. Echocardiography is a painless test that uses sound waves to create images of your heart. It provides your doctor with information about the size and shape of your heart and how well your heart's chambers and valves are working. This procedure takes approximately one hour. There are no restrictions for this procedure.  Follow-Up: Follow up with Dr Mare Ferrari in February 2017.  If you need a refill on your cardiac medications before your next appointment, please call your pharmacy.  Thank you for choosing [facility]!!

## 2015-02-06 NOTE — Progress Notes (Signed)
02/06/2015 [name]   [birth year]  [id number]  Primary Physician Eduardo Peng, NP Primary Cardiologist: Dr. Mare [name]   Reason for Visit/CC: [facility] F/u for Acute Respiratory Failure 2/2 PNA and pericardial effusion.   HPI:  81 y.o. male who is followed in cardiology by Dr. Mare [name] with known history of chronic established atrial fibrillation. He is not on Coumadin because of recurrent GI bleeds.  He had a cardiac catheterization on 08/07/01 showing normal coronary arteries and an ejection fraction of 60-65%. He had an echocardiogram on 04/21/11 showing an ejection fraction of 60-65%, moderate mitral regurgitation, mild aortic insufficiency, moderate tricuspid regurgitation, and pulmonary artery pressure was 46 mmHg. he has mild chronic pretibial edema. He has chronic mild pulmonary vascular congestion with basilar rales. He had a Lexiscan Myoview on 01/23/13 showing no ischemia. The study was not gated because of his atrial fibrillation.The patient had an echocardiogram 05/30/13 showing an ejection fraction of 55-60%.  He was recently admitted to the hospitalist service on 01/10/15 for Sepsis, acute respiratory failure, and suspected PNA. He presented to the ED with shortness of breath, tachycardia, tachypnea, and hypoxia. He had a CXR with suspected pericardial effusion and cardiomegaly. TTE revealed large circumferential effusion w/o tamponade.On 01/13/15, he underwent a pericardiocentesis by Dr. Ellyn [name]. He also has a left pleural effusion and underwent thoracentesis. Workup per infectious disease for pericardial effusion/pleural effusion did not reveal any infectious etiology, no malignant cells identified in the left pleural effusion. Repeat 2D echo showed  an EF of 45-50% with small to moderate pericardial effusion. Repeat CXR was stable. Once stable for discharge, he was sent home with home health services and home O2. He was discharged home on 2 L [name]. He is being followed  by ID.   He presents back to clinic today for f/u. He is accompanied by his son. He reports that he has done well since discharge. He continues on supplemental O2, but has not required any upward titration. He remains on 2L, but there have been no attempts to wean down. He feels that he is getting stronger day by day. He notes more energy. He denies CP, no worsening dyspnea, orthopnea, PND, syncope/ near syncope. His VS are stable. No rub on exam. Reports full medication compliance.    Current Outpatient Prescriptions  Medication Sig Dispense Refill  . aspirin 81 MG tablet Take 81 mg by mouth daily.    [name] donepezil (ARICEPT) 10 MG tablet Take 1 tablet (10 mg total) by mouth at bedtime. 90 tablet 3  . ferrous sulfate 325 (65 FE) MG tablet Take 487.5 mg by mouth 2 (two) times daily.     . folic acid (FOLVITE) 1 MG tablet Take 1 mg by mouth 2 (two) times daily.     . furosemide (LASIX) 40 MG tablet Take 80 mg by mouth daily.    [name] gabapentin (NEURONTIN) 100 MG capsule Take 100 mg by mouth at bedtime.    . hydrOXYzine (ATARAX/VISTARIL) 10 MG tablet Take 5 mg by mouth 3 (three) times daily as needed for itching (itching).     . memantine (NAMENDA XR) 28 MG CP24 24 hr capsule Take 1 capsule (28 mg total) by mouth daily. 90 capsule 3  . metoprolol tartrate (LOPRESSOR) 25 MG tablet Take 25 mg by mouth as directed. 1/2 tablet by mouth twice a day    . Multiple Vitamin (MULTIVITAMIN WITH MINERALS) TABS tablet Take 1 tablet by mouth daily.    . Multiple Vitamins-Minerals (PRESERVISION AREDS  2 PO) Take 1 tablet by mouth 2 (two) times daily.     . nitroGLYCERIN (NITROSTAT) 0.4 MG SL tablet Place 0.4 mg under the tongue every 5 (five) minutes as needed for chest pain (x 3 doses daily).    . solifenacin (VESICARE) 5 MG tablet Take 1 tablet (5 mg total) by mouth daily. 90 tablet 3   No current facility-administered medications for this visit.    Allergies  Allergen Reactions  . Cephalexin Other (See  Comments)    Unknown reaction  . Furacin [Nitrofurazone] Other (See Comments)    Unknown reaction  . Macrodantin Other (See Comments)    Unknown reaction  . Zestril [Lisinopril] Other (See Comments)    Unknown reaction    Social History   Social History  . Marital Status: Married    Spouse Name: [name]  . Number of Children: 4  . Years of Education: 10th    Occupational History  . retired    Social History Main Topics  . Smoking status: Former Smoker    Quit date: 08/28/1975  . Smokeless tobacco: Former Systems developer  . Alcohol Use: 4.2 oz/week    7 Shots of liquor per week  . Drug Use: No  . Sexual Activity: Not Currently   Other Topics Concern  . Not on file   Social History Narrative   Patient lives at home with his wife [name]) Married 24 years.   Retired   Education 10 th grade   Right handed   Caffeine one or two daily      Review of Systems: General: negative for chills, fever, night sweats or weight changes.  Cardiovascular: negative for chest pain, dyspnea on exertion, edema, orthopnea, palpitations, paroxysmal nocturnal dyspnea or shortness of breath Dermatological: negative for rash Respiratory: negative for cough or wheezing Urologic: negative for hematuria Abdominal: negative for nausea, vomiting, diarrhea, bright red blood per rectum, melena, or hematemesis Neurologic: negative for visual changes, syncope, or dizziness All other systems reviewed and are otherwise negative except as noted above.    Blood pressure 116/62, pulse 88, height 6\' 3"  (1.905 m), weight 203 lb (92.08 kg).  General appearance: alert, cooperative and no distress Neck: no carotid bruit and no JVD Lungs: sliglthy decreased BS along LLL Heart: irregularly irregular rhythm Extremities: no LEE Pulses: 2+ and symmetric Skin: warm and dry Neurologic: Grossly normal  EKG chronic atrial fibrillation. 88 bpm low voltage QRS complexes   ASSESSMENT AND PLAN:   1. Pericardial Effusion:  s/p pericardiocentesis. No symptoms of worsening dyspnea, chest pain, syncope/ near syncope.  VSS. Per hospital notes, plan was to repeat 2D echo in 4 weeks. Will arrange for this to be done next week to reassess the pericardium/ pericardial fluid.   2. Acute Respiratory Failure: 2/2 problem #1 and ? PNA. He has completed antibiotics. Respiratory status improved. Remains on 2L [place]. HH/PT can attempt to wean if tolerated.   3. Chronic Atrial Fibrillation: rate is well controlled. Not an anticoagulation candidate due to h/o GIB.  PLAN  Repeat 2D echo to reassess for pericardial effusion. F/u with Dr. Mare [name] in 2 months. Will have him f/u sooner if echo indicates significant recurrent effusion.    Lyda Jester PA-C 02/06/2015 2:31 PM

## 2015-02-07 ENCOUNTER — Telehealth: Payer: Self-pay | Admitting: Adult Health

## 2015-02-07 MED ORDER — OXYBUTYNIN CHLORIDE ER 5 MG PO TB24: 5.0000 mg | ORAL_TABLET | Freq: Every day | ORAL | Status: DC

## 2015-02-07 NOTE — Telephone Encounter (Signed)
Called and spoke with pt's son and he states they would like to try this medication.  Ok per [place] to send 5 mg ditropan to pharmacy. Vesicare discontinued and rx for ditropan 5 mg sent to pharmacy.

## 2015-02-07 NOTE — Telephone Encounter (Signed)
Pt son bilbo gieske would like to know if ditropan is effective as vesicare. The vesicare is to expensive now. [place].

## 2015-02-07 NOTE — Telephone Encounter (Signed)
It should be similar. Would he like to try it?

## 2015-02-08 DIAGNOSIS — I509 Heart failure, unspecified: Secondary | ICD-10-CM | POA: Diagnosis not present

## 2015-02-08 DIAGNOSIS — F0390 Unspecified dementia without behavioral disturbance: Secondary | ICD-10-CM | POA: Diagnosis not present

## 2015-02-08 DIAGNOSIS — I10 Essential (primary) hypertension: Secondary | ICD-10-CM | POA: Diagnosis not present

## 2015-02-08 DIAGNOSIS — R2689 Other abnormalities of gait and mobility: Secondary | ICD-10-CM | POA: Diagnosis not present

## 2015-02-08 DIAGNOSIS — E1140 Type 2 diabetes mellitus with diabetic neuropathy, unspecified: Secondary | ICD-10-CM | POA: Diagnosis not present

## 2015-02-08 DIAGNOSIS — I482 Chronic atrial fibrillation: Secondary | ICD-10-CM | POA: Diagnosis not present

## 2015-02-10 DIAGNOSIS — I10 Essential (primary) hypertension: Secondary | ICD-10-CM | POA: Diagnosis not present

## 2015-02-10 DIAGNOSIS — F0390 Unspecified dementia without behavioral disturbance: Secondary | ICD-10-CM | POA: Diagnosis not present

## 2015-02-10 DIAGNOSIS — E1140 Type 2 diabetes mellitus with diabetic neuropathy, unspecified: Secondary | ICD-10-CM | POA: Diagnosis not present

## 2015-02-10 DIAGNOSIS — I509 Heart failure, unspecified: Secondary | ICD-10-CM | POA: Diagnosis not present

## 2015-02-10 DIAGNOSIS — I482 Chronic atrial fibrillation: Secondary | ICD-10-CM | POA: Diagnosis not present

## 2015-02-10 DIAGNOSIS — R2689 Other abnormalities of gait and mobility: Secondary | ICD-10-CM | POA: Diagnosis not present

## 2015-02-11 DIAGNOSIS — I10 Essential (primary) hypertension: Secondary | ICD-10-CM | POA: Diagnosis not present

## 2015-02-11 DIAGNOSIS — R2689 Other abnormalities of gait and mobility: Secondary | ICD-10-CM | POA: Diagnosis not present

## 2015-02-11 DIAGNOSIS — F0390 Unspecified dementia without behavioral disturbance: Secondary | ICD-10-CM | POA: Diagnosis not present

## 2015-02-11 DIAGNOSIS — E1140 Type 2 diabetes mellitus with diabetic neuropathy, unspecified: Secondary | ICD-10-CM | POA: Diagnosis not present

## 2015-02-11 DIAGNOSIS — I509 Heart failure, unspecified: Secondary | ICD-10-CM | POA: Diagnosis not present

## 2015-02-11 DIAGNOSIS — I482 Chronic atrial fibrillation: Secondary | ICD-10-CM | POA: Diagnosis not present

## 2015-02-12 ENCOUNTER — Telehealth: Payer: Self-pay | Admitting: Family Medicine

## 2015-02-12 DIAGNOSIS — I482 Chronic atrial fibrillation: Secondary | ICD-10-CM | POA: Diagnosis not present

## 2015-02-12 DIAGNOSIS — I509 Heart failure, unspecified: Secondary | ICD-10-CM | POA: Diagnosis not present

## 2015-02-12 DIAGNOSIS — I10 Essential (primary) hypertension: Secondary | ICD-10-CM | POA: Diagnosis not present

## 2015-02-12 DIAGNOSIS — E1140 Type 2 diabetes mellitus with diabetic neuropathy, unspecified: Secondary | ICD-10-CM | POA: Diagnosis not present

## 2015-02-12 DIAGNOSIS — R2689 Other abnormalities of gait and mobility: Secondary | ICD-10-CM | POA: Diagnosis not present

## 2015-02-12 DIAGNOSIS — F0390 Unspecified dementia without behavioral disturbance: Secondary | ICD-10-CM | POA: Diagnosis not present

## 2015-02-12 NOTE — Telephone Encounter (Signed)
[name] called from [name] saying she needs orders to continue Occupational and Physical Therapy for [name]. She said it's ok to call her number and leave a verbal "ok" on her voice mail.   Please call [name] at: [phone] Thank you.

## 2015-02-12 NOTE — Telephone Encounter (Signed)
yes

## 2015-02-12 NOTE — Telephone Encounter (Signed)
Approve orders to continue OT and PT?

## 2015-02-12 NOTE — Telephone Encounter (Signed)
Spoke to [place]. She is aware of verbal okay.

## 2015-02-13 DIAGNOSIS — E1140 Type 2 diabetes mellitus with diabetic neuropathy, unspecified: Secondary | ICD-10-CM | POA: Diagnosis not present

## 2015-02-13 DIAGNOSIS — R2689 Other abnormalities of gait and mobility: Secondary | ICD-10-CM | POA: Diagnosis not present

## 2015-02-13 DIAGNOSIS — F0390 Unspecified dementia without behavioral disturbance: Secondary | ICD-10-CM | POA: Diagnosis not present

## 2015-02-13 DIAGNOSIS — I10 Essential (primary) hypertension: Secondary | ICD-10-CM | POA: Diagnosis not present

## 2015-02-13 DIAGNOSIS — I509 Heart failure, unspecified: Secondary | ICD-10-CM | POA: Diagnosis not present

## 2015-02-13 DIAGNOSIS — I482 Chronic atrial fibrillation: Secondary | ICD-10-CM | POA: Diagnosis not present

## 2015-02-14 DIAGNOSIS — I10 Essential (primary) hypertension: Secondary | ICD-10-CM | POA: Diagnosis not present

## 2015-02-14 DIAGNOSIS — F0390 Unspecified dementia without behavioral disturbance: Secondary | ICD-10-CM | POA: Diagnosis not present

## 2015-02-14 DIAGNOSIS — E1140 Type 2 diabetes mellitus with diabetic neuropathy, unspecified: Secondary | ICD-10-CM | POA: Diagnosis not present

## 2015-02-14 DIAGNOSIS — Z7982 Long term (current) use of aspirin: Secondary | ICD-10-CM | POA: Diagnosis not present

## 2015-02-14 DIAGNOSIS — Z8551 Personal history of malignant neoplasm of bladder: Secondary | ICD-10-CM | POA: Diagnosis not present

## 2015-02-14 DIAGNOSIS — R1310 Dysphagia, unspecified: Secondary | ICD-10-CM | POA: Diagnosis not present

## 2015-02-14 DIAGNOSIS — I350 Nonrheumatic aortic (valve) stenosis: Secondary | ICD-10-CM | POA: Diagnosis not present

## 2015-02-14 DIAGNOSIS — I5022 Chronic systolic (congestive) heart failure: Secondary | ICD-10-CM | POA: Diagnosis not present

## 2015-02-14 DIAGNOSIS — H9193 Unspecified hearing loss, bilateral: Secondary | ICD-10-CM | POA: Diagnosis not present

## 2015-02-14 DIAGNOSIS — I340 Nonrheumatic mitral (valve) insufficiency: Secondary | ICD-10-CM | POA: Diagnosis not present

## 2015-02-14 DIAGNOSIS — Z9181 History of falling: Secondary | ICD-10-CM | POA: Diagnosis not present

## 2015-02-14 DIAGNOSIS — E669 Obesity, unspecified: Secondary | ICD-10-CM | POA: Diagnosis not present

## 2015-02-14 DIAGNOSIS — D649 Anemia, unspecified: Secondary | ICD-10-CM | POA: Diagnosis not present

## 2015-02-14 DIAGNOSIS — I482 Chronic atrial fibrillation: Secondary | ICD-10-CM | POA: Diagnosis not present

## 2015-02-17 ENCOUNTER — Encounter: Payer: Self-pay | Admitting: Infectious Disease

## 2015-02-17 ENCOUNTER — Telehealth: Payer: Self-pay | Admitting: Family Medicine

## 2015-02-17 DIAGNOSIS — F0390 Unspecified dementia without behavioral disturbance: Secondary | ICD-10-CM | POA: Diagnosis not present

## 2015-02-17 DIAGNOSIS — I482 Chronic atrial fibrillation: Secondary | ICD-10-CM | POA: Diagnosis not present

## 2015-02-17 DIAGNOSIS — E1140 Type 2 diabetes mellitus with diabetic neuropathy, unspecified: Secondary | ICD-10-CM | POA: Diagnosis not present

## 2015-02-17 DIAGNOSIS — R1310 Dysphagia, unspecified: Secondary | ICD-10-CM | POA: Diagnosis not present

## 2015-02-17 DIAGNOSIS — I10 Essential (primary) hypertension: Secondary | ICD-10-CM | POA: Diagnosis not present

## 2015-02-17 DIAGNOSIS — I5022 Chronic systolic (congestive) heart failure: Secondary | ICD-10-CM | POA: Diagnosis not present

## 2015-02-17 NOTE — Telephone Encounter (Signed)
Verbally "okay" for orders left on VM.

## 2015-02-17 NOTE — Telephone Encounter (Signed)
Caitlyn Tamala Julian from [place] called to receive verbal orders to continue Nursing Asessments on [name] once a week. She'd like a phone call regarding this and said she has a secure voice mail if information needs to be left in a message.   Caitlyn's ph# [phone] Thank you.

## 2015-02-17 NOTE — Telephone Encounter (Signed)
Okay? Pending appt 02/18/2015 for f/u

## 2015-02-17 NOTE — Telephone Encounter (Signed)
That is fine,t hank you.

## 2015-02-18 ENCOUNTER — Encounter: Payer: Self-pay | Admitting: Adult Health

## 2015-02-18 ENCOUNTER — Ambulatory Visit (INDEPENDENT_AMBULATORY_CARE_PROVIDER_SITE_OTHER): Payer: Medicare Other | Admitting: Adult Health

## 2015-02-18 ENCOUNTER — Other Ambulatory Visit (HOSPITAL_COMMUNITY): Payer: Medicare Other

## 2015-02-18 VITALS — BP 110/60 | Temp 98.3°F | Ht 75.0 in | Wt 210.7 lb

## 2015-02-18 DIAGNOSIS — I5022 Chronic systolic (congestive) heart failure: Secondary | ICD-10-CM | POA: Diagnosis not present

## 2015-02-18 DIAGNOSIS — E1140 Type 2 diabetes mellitus with diabetic neuropathy, unspecified: Secondary | ICD-10-CM | POA: Diagnosis not present

## 2015-02-18 DIAGNOSIS — Z09 Encounter for follow-up examination after completed treatment for conditions other than malignant neoplasm: Secondary | ICD-10-CM | POA: Diagnosis not present

## 2015-02-18 DIAGNOSIS — I10 Essential (primary) hypertension: Secondary | ICD-10-CM | POA: Diagnosis not present

## 2015-02-18 DIAGNOSIS — F0390 Unspecified dementia without behavioral disturbance: Secondary | ICD-10-CM | POA: Diagnosis not present

## 2015-02-18 DIAGNOSIS — R1310 Dysphagia, unspecified: Secondary | ICD-10-CM | POA: Diagnosis not present

## 2015-02-18 DIAGNOSIS — I482 Chronic atrial fibrillation: Secondary | ICD-10-CM | POA: Diagnosis not present

## 2015-02-18 NOTE — Progress Notes (Signed)
Subjective:    Patient ID: [name], male    DOB: [birth year], 81 y.o.   MRN: [id number]  HPI 81 year old male presents to the office today for three week follow up after being s/p hospital follow up for pneumonia/sepsis. He is accompanied by his son.   Patient reports that he is feeling " much better". His son reports that his father has improved tremendously over the last three weeks. He is doing home PT/OT and progressing well in this therapy. He is up walking around and has gained weight. He seems more alert and oriented.   He has had two days of diarrhea and family contributes this to food at Thanksgiving. His last diarrhea episode was this morning. Has had normal BM since.    Interval history includes 1. Switching from Vesicare to Ditropan - son endorses this is working well.   2. Metoprolol has been decreased from 25 mg twice a day to 12.5 mg twice a day (for increased somnolence and dizziness). Son reports that the patient continues to sleep a lot but not as much during the day. BP today BP: 110/60 mmHg   Review of Systems  Constitutional: Positive for activity change and appetite change. Negative for fever, diaphoresis and fatigue.  HENT: Negative.   Respiratory: Positive for cough.   Cardiovascular: Negative.   Gastrointestinal: Negative.   Skin: Negative.   Neurological: Negative.   Psychiatric/Behavioral: Negative.    Past Medical History  Diagnosis Date  . Arrhythmia     a fib  . ED (erectile dysfunction)   . Memory disorder   . 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 Hematochezia     has intermittent problem  . Arthritis     DJD knee - left  . Restless leg     treats with heat  . Diabetes mellitus     NIDDM on oral medication  . Genital warts     recurrent. Not a problem  . GERD (gastroesophageal reflux disease)     treated with PPI  . Hypertension     mild  . Neuropathy (HCC)     burning discomfort feet/legs  . Irritable bladder   .  Hard of hearing   . Bladder cancer ([facility])     [facility] [name]    Social History   Social History  . Marital Status: Married    Spouse Name: [name]  . Number of Children: 4  . Years of Education: 10th    Occupational History  . retired    Social History Main Topics  . Smoking status: Former Smoker    Quit date: 08/28/1975  . Smokeless tobacco: Former Systems developer  . Alcohol Use: 4.2 oz/week    7 Shots of liquor per week  . Drug Use: No  . Sexual Activity: Not Currently   Other Topics Concern  . Not on file   Social History Narrative   Patient lives at home with his wife [name]) Married 55 years.   Retired   Education 10 th grade   Right handed   Caffeine one or two daily     Past Surgical History  Procedure Laterality Date  . Cardiovascular stress test  03/28/2003    EF 55%.  Normal cardiolite study  . Appendectomy  1946  . Tonsillectomy  1938  . Joint replacement  1990's    left TKR  . Knee arthroscopy  1980's    right knee  .  Cardiac catheterization N/A 01/13/2015    Procedure: Pericardiocentesis;  Surgeon: Leonie Man, MD;  Location: [facility];  Service: Cardiovascular;  Laterality: N/A;    Family History  Problem Relation Age of Onset  . Kidney disease Mother   . Heart disease Mother   . Stroke Mother   . Cancer Father     prostate  . Diabetes Neg Hx   . COPD Neg Hx   . Mental illness Brother     Allergies  Allergen Reactions  . Cephalexin Other (See Comments)    Unknown reaction  . Furacin [Nitrofurazone] Other (See Comments)    Unknown reaction  . Macrodantin Other (See Comments)    Unknown reaction  . Zestril [Lisinopril] Other (See Comments)    Unknown reaction    Current Outpatient Prescriptions on File Prior to Visit  Medication Sig Dispense Refill  . aspirin 81 MG tablet Take 81 mg by mouth daily.    [name] donepezil (ARICEPT) 10 MG tablet Take 1 tablet (10 mg total) by mouth at bedtime. 90 tablet 3  . ferrous sulfate 325 (65 FE)  MG tablet Take 487.5 mg by mouth 2 (two) times daily.     . folic acid (FOLVITE) 1 MG tablet Take 1 mg by mouth 2 (two) times daily.     . furosemide (LASIX) 40 MG tablet Take 80 mg by mouth daily.    [name] gabapentin (NEURONTIN) 100 MG capsule Take 100 mg by mouth at bedtime.    . hydrOXYzine (ATARAX/VISTARIL) 10 MG tablet Take 5 mg by mouth 3 (three) times daily as needed for itching (itching).     . memantine (NAMENDA XR) 28 MG CP24 24 hr capsule Take 1 capsule (28 mg total) by mouth daily. 90 capsule 3  . metoprolol tartrate (LOPRESSOR) 25 MG tablet Take 25 mg by mouth as directed. 1/2 tablet by mouth twice a day    . Multiple Vitamin (MULTIVITAMIN WITH MINERALS) TABS tablet Take 1 tablet by mouth daily.    . Multiple Vitamins-Minerals (PRESERVISION AREDS 2 PO) Take 1 tablet by mouth 2 (two) times daily.     . nitroGLYCERIN (NITROSTAT) 0.4 MG SL tablet Place 0.4 mg under the tongue every 5 (five) minutes as needed for chest pain (x 3 doses daily).    [name] oxybutynin (DITROPAN-XL) 5 MG 24 hr tablet Take 1 tablet (5 mg total) by mouth at bedtime. 90 tablet 1  . solifenacin (VESICARE) 5 MG tablet Take 1 tablet (5 mg total) by mouth daily. 90 tablet 3   No current facility-administered medications on file prior to visit.    BP 110/60 mmHg  Temp(Src) 98.3 F (36.8 C) (Oral)  Ht 6\' 3"  (1.905 m)  Wt 210 lb 11.2 oz (95.573 kg)  BMI 26.34 kg/m2       Objective:   Physical Exam  Constitutional: He appears well-developed and well-nourished. No distress.  Eyes: Conjunctivae and EOM are normal. Pupils are equal, round, and reactive to light.  Cardiovascular: Normal rate, regular rhythm, normal heart sounds and intact distal pulses.  Exam reveals no gallop and no friction rub.   No murmur heard. Pulmonary/Chest: Effort normal and breath sounds normal. No respiratory distress. He has no wheezes. He has no rales. He exhibits no tenderness.  Neurological: He is alert.  Alert and oriented to person and  place - baseline for patient.   Skin: Skin is warm and dry. No rash noted. He is not diaphoretic. No erythema. No pallor.  Stage one pressure wound on buttocks appears to be healing well.   Psychiatric: He has a normal mood and affect. His behavior is normal. Judgment and thought content normal.  Nursing note and vitals reviewed.      Assessment & Plan:  1. Follow up - He appears to be doing very well since being discharged from the hospital. He is due for repeat chest xray .  - DG Chest 2 View; Future - Follow up as needed

## 2015-02-18 NOTE — Progress Notes (Signed)
Pre visit review using our clinic review tool, if applicable. No additional management support is needed unless otherwise documented below in the visit note. 

## 2015-02-19 DIAGNOSIS — F0390 Unspecified dementia without behavioral disturbance: Secondary | ICD-10-CM | POA: Diagnosis not present

## 2015-02-19 DIAGNOSIS — I10 Essential (primary) hypertension: Secondary | ICD-10-CM | POA: Diagnosis not present

## 2015-02-19 DIAGNOSIS — I5022 Chronic systolic (congestive) heart failure: Secondary | ICD-10-CM | POA: Diagnosis not present

## 2015-02-19 DIAGNOSIS — E1140 Type 2 diabetes mellitus with diabetic neuropathy, unspecified: Secondary | ICD-10-CM | POA: Diagnosis not present

## 2015-02-19 DIAGNOSIS — I482 Chronic atrial fibrillation: Secondary | ICD-10-CM | POA: Diagnosis not present

## 2015-02-19 DIAGNOSIS — R1310 Dysphagia, unspecified: Secondary | ICD-10-CM | POA: Diagnosis not present

## 2015-02-20 ENCOUNTER — Ambulatory Visit (INDEPENDENT_AMBULATORY_CARE_PROVIDER_SITE_OTHER)
Admission: RE | Admit: 2015-02-20 | Discharge: 2015-02-20 | Disposition: A | Discharge status: Home / Self Care | Payer: Medicare Other | Source: Ambulatory Visit | Attending: Adult Health | Admitting: Adult Health

## 2015-02-20 DIAGNOSIS — Z09 Encounter for follow-up examination after completed treatment for conditions other than malignant neoplasm: Secondary | ICD-10-CM | POA: Diagnosis not present

## 2015-02-20 DIAGNOSIS — Z8701 Personal history of pneumonia (recurrent): Secondary | ICD-10-CM

## 2015-02-20 DIAGNOSIS — J90 Pleural effusion, not elsewhere classified: Secondary | ICD-10-CM | POA: Diagnosis not present

## 2015-02-21 ENCOUNTER — Telehealth: Payer: Self-pay | Admitting: Adult Health

## 2015-02-21 ENCOUNTER — Other Ambulatory Visit: Payer: Self-pay | Admitting: Adult Health

## 2015-02-21 DIAGNOSIS — J90 Pleural effusion, not elsewhere classified: Secondary | ICD-10-CM

## 2015-02-21 DIAGNOSIS — E1140 Type 2 diabetes mellitus with diabetic neuropathy, unspecified: Secondary | ICD-10-CM | POA: Diagnosis not present

## 2015-02-21 DIAGNOSIS — I482 Chronic atrial fibrillation: Secondary | ICD-10-CM | POA: Diagnosis not present

## 2015-02-21 DIAGNOSIS — I5022 Chronic systolic (congestive) heart failure: Secondary | ICD-10-CM | POA: Diagnosis not present

## 2015-02-21 DIAGNOSIS — F0390 Unspecified dementia without behavioral disturbance: Secondary | ICD-10-CM | POA: Diagnosis not present

## 2015-02-21 DIAGNOSIS — R1310 Dysphagia, unspecified: Secondary | ICD-10-CM | POA: Diagnosis not present

## 2015-02-21 DIAGNOSIS — I10 Essential (primary) hypertension: Secondary | ICD-10-CM | POA: Diagnosis not present

## 2015-02-21 MED ORDER — LEVOFLOXACIN 750 MG PO TABS: 750.0000 mg | ORAL_TABLET | Freq: Every day | ORAL | Status: DC

## 2015-02-21 NOTE — Telephone Encounter (Signed)
Spoke to  [place] wife [name]) on the phone and informed her of his Pleural Effusion and that I was going to get him into see a Pulmonologist to have the fluid drained. Will also treat for PNA due to not knowing if this is a pleural effusion, pneumonia or combination. [name] endorses that [name] is no short of breath nor does he have a fever.   Will treat with Levaquin 750 mg daily for 7 days.

## 2015-02-21 NOTE — Telephone Encounter (Signed)
Spoke to son [name]) and updated him on plan with abx therapy and seeing pulmonology.

## 2015-02-22 DIAGNOSIS — R1310 Dysphagia, unspecified: Secondary | ICD-10-CM | POA: Diagnosis not present

## 2015-02-22 DIAGNOSIS — I10 Essential (primary) hypertension: Secondary | ICD-10-CM | POA: Diagnosis not present

## 2015-02-22 DIAGNOSIS — I482 Chronic atrial fibrillation: Secondary | ICD-10-CM | POA: Diagnosis not present

## 2015-02-22 DIAGNOSIS — E1140 Type 2 diabetes mellitus with diabetic neuropathy, unspecified: Secondary | ICD-10-CM | POA: Diagnosis not present

## 2015-02-22 DIAGNOSIS — I5022 Chronic systolic (congestive) heart failure: Secondary | ICD-10-CM | POA: Diagnosis not present

## 2015-02-22 DIAGNOSIS — F0390 Unspecified dementia without behavioral disturbance: Secondary | ICD-10-CM | POA: Diagnosis not present

## 2015-02-24 ENCOUNTER — Telehealth: Payer: Self-pay | Admitting: Cardiology

## 2015-02-24 DIAGNOSIS — I482 Chronic atrial fibrillation: Secondary | ICD-10-CM | POA: Diagnosis not present

## 2015-02-24 DIAGNOSIS — I10 Essential (primary) hypertension: Secondary | ICD-10-CM | POA: Diagnosis not present

## 2015-02-24 DIAGNOSIS — I5022 Chronic systolic (congestive) heart failure: Secondary | ICD-10-CM | POA: Diagnosis not present

## 2015-02-24 DIAGNOSIS — F0390 Unspecified dementia without behavioral disturbance: Secondary | ICD-10-CM | POA: Diagnosis not present

## 2015-02-24 DIAGNOSIS — E1140 Type 2 diabetes mellitus with diabetic neuropathy, unspecified: Secondary | ICD-10-CM | POA: Diagnosis not present

## 2015-02-24 DIAGNOSIS — R1310 Dysphagia, unspecified: Secondary | ICD-10-CM | POA: Diagnosis not present

## 2015-02-24 NOTE — Telephone Encounter (Signed)
Patient caregiver instructed to take an extra Lasix 40 mg this afternoon as ordered by [name] Repeated instructions correctly Fernand Parkins to inform her of the additional Lasix 40 mg dose this afternoon [id number])

## 2015-02-24 NOTE — Telephone Encounter (Signed)
Today have him take an additional 40 mg lasix this afternoon, and let Rip Harbour know tomorrow how he is doing.

## 2015-02-24 NOTE — Telephone Encounter (Signed)
New message    Home health at home now    Friday weight 201 Today weight 209.- nurse states patient had did not have the same shoes on from Friday-  C/O both ankles up a 1/2 centimeter /  + 2 edema / crackles in lungs - pt on levaquin 750 mg   Take 2 40 mg of lasix in am    Vital signs today  126/60, 97, 20,95 % -2 liter of oxygen  96.2temp

## 2015-02-24 NOTE — Telephone Encounter (Signed)
Kaitlyn RN from home health called while visiting patient.  The nurse reports crackles bilateral lungs, ankle measurements up 1/2 cm, pitting edema Right +2, left +1.    Weight is up to 209 from 201 lbs  [name] feels it is an unreliable measurement since he was weighed fully clothed with large heavy shoes and Friday he was weighed in underwear only but he wasn't able to balance well.  VS today 126/60 P 97 R 20 O2 sat 2L/[place] 95 % T= 96.2  On 12/02 it was mentioned in the note that he would be getting an appointment and referral to pulmonary.  Wife hasn't heard of any upcoming appointment with Pulmonary.

## 2015-02-25 ENCOUNTER — Ambulatory Visit (HOSPITAL_COMMUNITY): Payer: Medicare Other

## 2015-02-25 DIAGNOSIS — I482 Chronic atrial fibrillation: Secondary | ICD-10-CM | POA: Diagnosis not present

## 2015-02-25 DIAGNOSIS — E1140 Type 2 diabetes mellitus with diabetic neuropathy, unspecified: Secondary | ICD-10-CM | POA: Diagnosis not present

## 2015-02-25 DIAGNOSIS — F0390 Unspecified dementia without behavioral disturbance: Secondary | ICD-10-CM | POA: Diagnosis not present

## 2015-02-25 DIAGNOSIS — I5022 Chronic systolic (congestive) heart failure: Secondary | ICD-10-CM | POA: Diagnosis not present

## 2015-02-25 DIAGNOSIS — R1310 Dysphagia, unspecified: Secondary | ICD-10-CM | POA: Diagnosis not present

## 2015-02-25 DIAGNOSIS — I10 Essential (primary) hypertension: Secondary | ICD-10-CM | POA: Diagnosis not present

## 2015-02-25 NOTE — Telephone Encounter (Signed)
Received note from home health:   The nurse reports crackles bilateral lungs, ankle measurements up 1/2 cm, pitting edema Right +2, left +1.    Weight is up to 209 from 201 lbs [name] feels it is an unreliable measurement since he was weighed fully clothed with large heavy shoes and Friday he was weighed in underwear only but he wasn't able to balance well.   VS today 126/60 P 97 R 20 O2 sat 2L/[place] 95 % T= 96.2   Spoke to patients [name] in regards to this. [name] feels like his fathers ankles have indeed become more swollen but he does not feel like he is any more short of breath past his baseline. [name] continues to wear his home oxygen. [name] was advised to take the patient to the ER for possible thoracentesis due to pleural effusion, if [name] becomes short of breath or if he continues to gain weight. Can add 40 mg additional Lasix every other day for 3 doses.

## 2015-02-26 ENCOUNTER — Encounter: Payer: Self-pay | Admitting: Emergency Medicine

## 2015-02-26 ENCOUNTER — Ambulatory Visit (INDEPENDENT_AMBULATORY_CARE_PROVIDER_SITE_OTHER): Payer: Medicare Other | Admitting: Emergency Medicine

## 2015-02-26 ENCOUNTER — Inpatient Hospital Stay: Payer: Medicare Other | Admitting: Infectious Disease

## 2015-02-26 ENCOUNTER — Telehealth: Payer: Self-pay | Admitting: Cardiology

## 2015-02-26 VITALS — BP 110/62 | HR 68 | Wt 210.0 lb

## 2015-02-26 DIAGNOSIS — F0390 Unspecified dementia without behavioral disturbance: Secondary | ICD-10-CM | POA: Diagnosis not present

## 2015-02-26 DIAGNOSIS — I10 Essential (primary) hypertension: Secondary | ICD-10-CM | POA: Diagnosis not present

## 2015-02-26 DIAGNOSIS — R1310 Dysphagia, unspecified: Secondary | ICD-10-CM | POA: Diagnosis not present

## 2015-02-26 DIAGNOSIS — J90 Pleural effusion, not elsewhere classified: Secondary | ICD-10-CM | POA: Insufficient documentation

## 2015-02-26 DIAGNOSIS — E1140 Type 2 diabetes mellitus with diabetic neuropathy, unspecified: Secondary | ICD-10-CM | POA: Diagnosis not present

## 2015-02-26 DIAGNOSIS — I482 Chronic atrial fibrillation: Secondary | ICD-10-CM | POA: Diagnosis not present

## 2015-02-26 DIAGNOSIS — I5022 Chronic systolic (congestive) heart failure: Secondary | ICD-10-CM | POA: Diagnosis not present

## 2015-02-26 NOTE — Telephone Encounter (Signed)
Medications reviewed with son and patient will be at his appointment

## 2015-02-26 NOTE — Progress Notes (Signed)
Subjective:    Patient ID: [name], male    DOB: May 09, 1927, 81 y.o.   MRN: [id number]  HPI 81 yo man, former smoker, with a hx of A fib, DM, GERD, DJD. He is referred by C Nafiziger. He was admitted for pericardial effusion and pleural effusion in 10/21-10/31. He was treated empirically for CAP at that time although the effusion was a transudate. Went home from hospital on lasix 80mg  daily (up from his usual 60mg ).  He underwent repeat CXR 21/1/16 that I have reviewed showed some increase in size of the L effusion compared with discharge film. Based on this he was started on empiric abx. He is following with Dr Mare Ferrari also, has had his weight and fluid status monitored. His lasix was just increased 3 days ago to 80mg  am, 40mg  in the pm 2 days out of the week.    Review of Systems  Constitutional: Negative for fever and unexpected weight change.  HENT: Negative for congestion, dental problem, ear pain, nosebleeds, postnasal drip, rhinorrhea, sinus pressure, sneezing, sore throat and trouble swallowing.   Eyes: Negative for redness and itching.  Respiratory: Positive for cough and shortness of breath. Negative for chest tightness and wheezing.   Cardiovascular: Positive for palpitations. Negative for leg swelling.  Gastrointestinal: Negative for nausea and vomiting.  Genitourinary: Negative for dysuria.  Musculoskeletal: Negative for joint swelling.  Skin: Negative for rash.  Neurological: Negative for headaches.  Hematological: Does not bruise/bleed easily.  Psychiatric/Behavioral: Negative for dysphoric mood. The patient is not nervous/anxious.    Past Medical History  Diagnosis Date  . Arrhythmia     a fib  . ED (erectile dysfunction)   . Memory disorder   . 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 Hematochezia     has intermittent problem  . Arthritis     DJD knee - left  . Restless leg     treats with heat  . Diabetes mellitus     NIDDM on oral  medication  . Genital warts     recurrent. Not a problem  . GERD (gastroesophageal reflux disease)     treated with PPI  . Hypertension     mild  . Neuropathy (HCC)     burning discomfort feet/legs  . Irritable bladder   . Hard of hearing   . Bladder cancer (HCC)     BCG [name]     Family History  Problem Relation Age of Onset  . Kidney disease Mother   . Heart disease Mother   . Stroke Mother   . Cancer Father     prostate  . Diabetes Neg Hx   . COPD Neg Hx   . Mental illness Brother      Social History   Social History  . Marital Status: Married    Spouse Name: [name]  . Number of Children: 4  . Years of Education: 10th    Occupational History  . retired    Social History Main Topics  . Smoking status: Former Smoker    Quit date: 08/28/1975  . Smokeless tobacco: Former Systems developer  . Alcohol Use: 4.2 oz/week    7 Shots of liquor per week  . Drug Use: No  . Sexual Activity: Not Currently   Other Topics Concern  . Not on file   Social History Narrative   Patient lives at home with his wife [name]) Married 4 years.   Retired  Education 10 th grade   Right handed   Caffeine one or two daily      Allergies  Allergen Reactions  . Cephalexin Other (See Comments)    Unknown reaction  . Furacin [Nitrofurazone] Other (See Comments)    Unknown reaction  . Macrodantin Other (See Comments)    Unknown reaction  . Zestril [Lisinopril] Other (See Comments)    Unknown reaction     Outpatient Prescriptions Prior to Visit  Medication Sig Dispense Refill  . aspirin 81 MG tablet Take 81 mg by mouth daily.    [name] donepezil (ARICEPT) 10 MG tablet Take 1 tablet (10 mg total) by mouth at bedtime. 90 tablet 3  . ferrous sulfate 325 (65 FE) MG tablet Take 487.5 mg by mouth 2 (two) times daily.     . folic acid (FOLVITE) 1 MG tablet Take 1 mg by mouth 2 (two) times daily.     . furosemide (LASIX) 40 MG tablet Take 80 mg by mouth daily.    [name] gabapentin (NEURONTIN) 100  MG capsule Take 100 mg by mouth at bedtime.    . hydrOXYzine (ATARAX/VISTARIL) 10 MG tablet Take 5 mg by mouth 3 (three) times daily as needed for itching (itching).     [name] levofloxacin (LEVAQUIN) 750 MG tablet Take 1 tablet (750 mg total) by mouth daily. 7 tablet 0  . memantine (NAMENDA XR) 28 MG CP24 24 hr capsule Take 1 capsule (28 mg total) by mouth daily. 90 capsule 3  . metoprolol tartrate (LOPRESSOR) 25 MG tablet Take 25 mg by mouth as directed. 1/2 tablet by mouth twice a day    . Multiple Vitamin (MULTIVITAMIN WITH MINERALS) TABS tablet Take 1 tablet by mouth daily.    . Multiple Vitamins-Minerals (PRESERVISION AREDS 2 PO) Take 1 tablet by mouth 2 (two) times daily.     . nitroGLYCERIN (NITROSTAT) 0.4 MG SL tablet Place 0.4 mg under the tongue every 5 (five) minutes as needed for chest pain (x 3 doses daily).    [name] oxybutynin (DITROPAN-XL) 5 MG 24 hr tablet Take 1 tablet (5 mg total) by mouth at bedtime. 90 tablet 1  . solifenacin (VESICARE) 5 MG tablet Take 1 tablet (5 mg total) by mouth daily. 90 tablet 3   No facility-administered medications prior to visit.         Objective:   Physical Exam Filed Vitals:   02/26/15 1511  BP: 110/62  Pulse: 68  Weight: 210 lb (95.255 kg)  SpO2: 89%   Gen: Pleasant, elderly man.  in no distress,  normal affect  ENT: No lesions,  mouth clear,  oropharynx clear, no postnasal drip  Neck: No JVD, no TMG, no carotid bruits  Lungs: No use of accessory muscles, no dullness to percussion, clear without rales or rhonchi  Cardiovascular: RRR, heart sounds normal, no murmur or gallops, no peripheral edema  Musculoskeletal: No deformities, no cyanosis or clubbing  Neuro: alert, non focal  Skin: Warm, no lesions or rashes      Assessment & Plan:  Pleural effusion Interval increase in his left pleural effusion,  Transudate on hospital analysis. Almost certainly due to his overall volume status. He has edema, wt gain. Lasix was recently  increased, but he hasn't done it according to cardiology recs. I will ask him to take 40mg  in the pm qod until they can see him. He may need repeat thora at some point, but managing his overall volume status will be more effective. I don't  believe he has CAP based on sx. We will get him in to see cards soon. I will follow his CXR regarding any need for repeat thora.

## 2015-02-26 NOTE — Telephone Encounter (Signed)
°  New Prob   Pts son calling on behalf of pt. Has some questions regarding Lasix. Please call.

## 2015-02-26 NOTE — Assessment & Plan Note (Signed)
Interval increase in his left pleural effusion,  Transudate on hospital analysis. Almost certainly due to his overall volume status. He has edema, wt gain. Lasix was recently increased, but he hasn't done it according to cardiology recs. I will ask him to take 40mg  in the pm qod until they can see him. He may need repeat thora at some point, but managing his overall volume status will be more effective. I don't believe he has CAP based on sx. We will get him in to see cards soon. I will follow his CXR regarding any need for repeat thora.

## 2015-02-26 NOTE — Patient Instructions (Addendum)
Please continue your furosemide 80mg  in the am every day. Take an additional 40mg  in the pm every other day.  We will need to follow your CXR intermittently to see how much fluid is around the left lung.  Follow with Dr Lamonte Sakai in 2 months

## 2015-02-27 LAB — AFB CULTURE WITH SMEAR (NOT AT ARMC): ACID FAST SMEAR: NONE SEEN

## 2015-03-01 DIAGNOSIS — I482 Chronic atrial fibrillation: Secondary | ICD-10-CM | POA: Diagnosis not present

## 2015-03-01 DIAGNOSIS — R1310 Dysphagia, unspecified: Secondary | ICD-10-CM | POA: Diagnosis not present

## 2015-03-01 DIAGNOSIS — E1140 Type 2 diabetes mellitus with diabetic neuropathy, unspecified: Secondary | ICD-10-CM | POA: Diagnosis not present

## 2015-03-01 DIAGNOSIS — I5022 Chronic systolic (congestive) heart failure: Secondary | ICD-10-CM | POA: Diagnosis not present

## 2015-03-01 DIAGNOSIS — I10 Essential (primary) hypertension: Secondary | ICD-10-CM | POA: Diagnosis not present

## 2015-03-01 DIAGNOSIS — F0390 Unspecified dementia without behavioral disturbance: Secondary | ICD-10-CM | POA: Diagnosis not present

## 2015-03-01 LAB — AFB CULTURE WITH SMEAR (NOT AT ARMC): ACID FAST SMEAR: NONE SEEN

## 2015-03-03 DIAGNOSIS — F0390 Unspecified dementia without behavioral disturbance: Secondary | ICD-10-CM | POA: Diagnosis not present

## 2015-03-03 DIAGNOSIS — I482 Chronic atrial fibrillation: Secondary | ICD-10-CM | POA: Diagnosis not present

## 2015-03-03 DIAGNOSIS — I10 Essential (primary) hypertension: Secondary | ICD-10-CM | POA: Diagnosis not present

## 2015-03-03 DIAGNOSIS — E1140 Type 2 diabetes mellitus with diabetic neuropathy, unspecified: Secondary | ICD-10-CM | POA: Diagnosis not present

## 2015-03-03 DIAGNOSIS — R1310 Dysphagia, unspecified: Secondary | ICD-10-CM | POA: Diagnosis not present

## 2015-03-03 DIAGNOSIS — I5022 Chronic systolic (congestive) heart failure: Secondary | ICD-10-CM | POA: Diagnosis not present

## 2015-03-04 DIAGNOSIS — I482 Chronic atrial fibrillation: Secondary | ICD-10-CM | POA: Diagnosis not present

## 2015-03-04 DIAGNOSIS — F0390 Unspecified dementia without behavioral disturbance: Secondary | ICD-10-CM | POA: Diagnosis not present

## 2015-03-04 DIAGNOSIS — E1140 Type 2 diabetes mellitus with diabetic neuropathy, unspecified: Secondary | ICD-10-CM | POA: Diagnosis not present

## 2015-03-04 DIAGNOSIS — R1310 Dysphagia, unspecified: Secondary | ICD-10-CM | POA: Diagnosis not present

## 2015-03-04 DIAGNOSIS — I5022 Chronic systolic (congestive) heart failure: Secondary | ICD-10-CM | POA: Diagnosis not present

## 2015-03-04 DIAGNOSIS — I10 Essential (primary) hypertension: Secondary | ICD-10-CM | POA: Diagnosis not present

## 2015-03-05 DIAGNOSIS — I482 Chronic atrial fibrillation: Secondary | ICD-10-CM | POA: Diagnosis not present

## 2015-03-05 DIAGNOSIS — F0390 Unspecified dementia without behavioral disturbance: Secondary | ICD-10-CM | POA: Diagnosis not present

## 2015-03-05 DIAGNOSIS — R1310 Dysphagia, unspecified: Secondary | ICD-10-CM | POA: Diagnosis not present

## 2015-03-05 DIAGNOSIS — I10 Essential (primary) hypertension: Secondary | ICD-10-CM | POA: Diagnosis not present

## 2015-03-05 DIAGNOSIS — E1140 Type 2 diabetes mellitus with diabetic neuropathy, unspecified: Secondary | ICD-10-CM | POA: Diagnosis not present

## 2015-03-05 DIAGNOSIS — I5022 Chronic systolic (congestive) heart failure: Secondary | ICD-10-CM | POA: Diagnosis not present

## 2015-03-06 DIAGNOSIS — F0390 Unspecified dementia without behavioral disturbance: Secondary | ICD-10-CM | POA: Diagnosis not present

## 2015-03-06 DIAGNOSIS — I10 Essential (primary) hypertension: Secondary | ICD-10-CM | POA: Diagnosis not present

## 2015-03-06 DIAGNOSIS — I5022 Chronic systolic (congestive) heart failure: Secondary | ICD-10-CM | POA: Diagnosis not present

## 2015-03-06 DIAGNOSIS — E1140 Type 2 diabetes mellitus with diabetic neuropathy, unspecified: Secondary | ICD-10-CM | POA: Diagnosis not present

## 2015-03-06 DIAGNOSIS — R1310 Dysphagia, unspecified: Secondary | ICD-10-CM | POA: Diagnosis not present

## 2015-03-06 DIAGNOSIS — I482 Chronic atrial fibrillation: Secondary | ICD-10-CM | POA: Diagnosis not present

## 2015-03-10 DIAGNOSIS — F0390 Unspecified dementia without behavioral disturbance: Secondary | ICD-10-CM | POA: Diagnosis not present

## 2015-03-10 DIAGNOSIS — I5022 Chronic systolic (congestive) heart failure: Secondary | ICD-10-CM | POA: Diagnosis not present

## 2015-03-10 DIAGNOSIS — I482 Chronic atrial fibrillation: Secondary | ICD-10-CM | POA: Diagnosis not present

## 2015-03-10 DIAGNOSIS — E1140 Type 2 diabetes mellitus with diabetic neuropathy, unspecified: Secondary | ICD-10-CM | POA: Diagnosis not present

## 2015-03-10 DIAGNOSIS — I10 Essential (primary) hypertension: Secondary | ICD-10-CM | POA: Diagnosis not present

## 2015-03-10 DIAGNOSIS — R1310 Dysphagia, unspecified: Secondary | ICD-10-CM | POA: Diagnosis not present

## 2015-03-11 ENCOUNTER — Ambulatory Visit (HOSPITAL_COMMUNITY): Payer: Medicare Other | Attending: Internal Medicine

## 2015-03-11 DIAGNOSIS — I319 Disease of pericardium, unspecified: Secondary | ICD-10-CM | POA: Insufficient documentation

## 2015-03-11 DIAGNOSIS — J90 Pleural effusion, not elsewhere classified: Secondary | ICD-10-CM | POA: Insufficient documentation

## 2015-03-11 DIAGNOSIS — I5033 Acute on chronic diastolic (congestive) heart failure: Secondary | ICD-10-CM | POA: Insufficient documentation

## 2015-03-11 DIAGNOSIS — I313 Pericardial effusion (noninflammatory): Secondary | ICD-10-CM

## 2015-03-11 DIAGNOSIS — I3139 Other pericardial effusion (noninflammatory): Secondary | ICD-10-CM

## 2015-03-12 DIAGNOSIS — F0390 Unspecified dementia without behavioral disturbance: Secondary | ICD-10-CM | POA: Diagnosis not present

## 2015-03-12 DIAGNOSIS — I5022 Chronic systolic (congestive) heart failure: Secondary | ICD-10-CM | POA: Diagnosis not present

## 2015-03-12 DIAGNOSIS — I482 Chronic atrial fibrillation: Secondary | ICD-10-CM | POA: Diagnosis not present

## 2015-03-12 DIAGNOSIS — R1310 Dysphagia, unspecified: Secondary | ICD-10-CM | POA: Diagnosis not present

## 2015-03-12 DIAGNOSIS — E1140 Type 2 diabetes mellitus with diabetic neuropathy, unspecified: Secondary | ICD-10-CM | POA: Diagnosis not present

## 2015-03-12 DIAGNOSIS — I10 Essential (primary) hypertension: Secondary | ICD-10-CM | POA: Diagnosis not present

## 2015-03-12 NOTE — Progress Notes (Signed)
Cardiology Office Note   Date:  03/13/2015   ID:  [name], DOB [birth year], MRN [id number]  PCP:  [name], NP  Cardiologist: [name]  Follow up- pleural effusion/CHF   History of Present Illness: [name] is a 81 y.o. male 81 y.o. male with a history of bladder cancer, DM, GERD, chronic atrial fibrillation and recent pericardial effusion s/p pericardiocentesis who presents to clinic for follow up.   He is followed in cardiology by [name] with known history of chronic established atrial fibrillation. He is not on Coumadin because of recurrent GI bleeds.   He had a cardiac catheterization on 08/07/01 showing normal coronary arteries and an ejection fraction of 60-65%. He had an echocardiogram on 04/21/11 showing an ejection fraction of 60-65%, moderate mitral regurgitation, mild aortic insufficiency, moderate tricuspid regurgitation, and pulmonary artery pressure was 46 mmHg.  He has mild chronic pretibial edema.  He has chronic mild pulmonary vascular congestion with basilar rales.  He had a Lexiscan Myoview on 01/23/13 showing no ischemia.  The study was not gated because of his atrial fibrillation.The patient had an echocardiogram 05/30/13 showing an ejection fraction of 55-60%.  He was recently admitted to the hospitalist service on 01/10/15 for sepsis, acute respiratory failure, and suspected PNA.  He presented to the ED with shortness of breath, tachycardia, tachypnea, and hypoxia.  He had a CXR with suspected pericardial effusion and cardiomegaly.  TTE revealed large circumferential effusion w/o tamponade. On 01/13/15, he underwent a pericardiocentesis by Dr. Ellyn Hack. He also has a left pleural effusion and underwent thoracentesis. Workup per infectious disease for pericardial effusion/pleural effusion did not reveal any infectious etiology, no malignant cells identified in the left pleural effusion. It was felt  to be transudative.  Repeat 2D echo showed an EF of  45-50% with small to moderate pericardial effusion. Repeat CXR was stable. Once stable for discharge, he was sent home with home health services and home O2. He was discharged home on 2 L [place].   He was seen in clinic by Ellen Henri PA-C on 02/06/15 for post hospital follow up. He was doing better but remained on 2L 02. He felt like he was getting stronger. She arranged for a follow up limited 2 D ECHO which was done on 03/11/15: this showed a trivial pericardial effusion with right/left pleural effusion.     He was discharged on lasix 80mg  daily (up from his usual 60mg ). He underwent repeat CXR 02/20/15 that Dr. Lamonte Sakai reviewed which  showed some increase in size of the L effusion compared with discharge film. He felt this was due to CHF. He increased lasix to 80 mg q AM and 40mg  q PM (M, W, Fr).  He wanted him to be seen back by cardiology to manage lasix.   Today he presents to clinic for follow up. Here with his son. He denies CP or SOB. He has chronic LE edema. He walks with a walker. He wears 02 at home but is not wearing it now and his 02 sat is 97%. He denies issues with breathing. He denies dizziness or syncope. He really has no complaints.   Past Medical History  Diagnosis Date  . Arrhythmia     a fib  . ED (erectile dysfunction)   . Memory disorder   . 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 Hematochezia     has intermittent problem  . Arthritis     DJD  knee - left  . Restless leg     treats with heat  . Diabetes mellitus     NIDDM on oral medication  . Genital warts     recurrent. Not a problem  . GERD (gastroesophageal reflux disease)     treated with PPI  . Hypertension     mild  . Neuropathy (HCC)     burning discomfort feet/legs  . Irritable bladder   . Hard of hearing   . Bladder cancer (HCC)     BCG [name]    Past Surgical History  Procedure Laterality Date  . Cardiovascular stress test  03/28/2003    EF 55%.  Normal cardiolite study  .  Appendectomy  1946  . Tonsillectomy  1938  . Joint replacement  1990's    left TKR  . Knee arthroscopy  1980's    right knee  . Cardiac catheterization N/A 01/13/2015    Procedure: Pericardiocentesis;  Surgeon: Leonie Man, MD;  Location: [facility];  Service: Cardiovascular;  Laterality: N/A;     Current Outpatient Prescriptions  Medication Sig Dispense Refill  . aspirin 81 MG tablet Take 81 mg by mouth daily.    [name] donepezil (ARICEPT) 10 MG tablet Take 1 tablet (10 mg total) by mouth at bedtime. 90 tablet 3  . ferrous sulfate 325 (65 FE) MG tablet Take 487.5 mg by mouth 2 (two) times daily. TAKE 1.5 TABS TWICE DAILY    . folic acid (FOLVITE) 1 MG tablet Take 1 mg by mouth 2 (two) times daily.     . furosemide (LASIX) 40 MG tablet Take 80 mg by mouth every morning. M-W-F- TAKE 40 MG ADDITIONAL  EACH NIGHT    . gabapentin (NEURONTIN) 100 MG capsule Take 100 mg by mouth at bedtime.    . hydrOXYzine (ATARAX/VISTARIL) 10 MG tablet Take 5 mg by mouth 3 (three) times daily as needed for itching (itching).     [name] levofloxacin (LEVAQUIN) 750 MG tablet Take 1 tablet (750 mg total) by mouth daily. 7 tablet 0  . memantine (NAMENDA XR) 28 MG CP24 24 hr capsule Take 1 capsule (28 mg total) by mouth daily. 90 capsule 3  . metoprolol tartrate (LOPRESSOR) 25 MG tablet Take 25 mg by mouth as directed. 1/2 tablet by mouth twice a day    . Multiple Vitamin (MULTIVITAMIN WITH MINERALS) TABS tablet Take 1 tablet by mouth daily.    . Multiple Vitamins-Minerals (PRESERVISION AREDS 2 PO) Take 1 tablet by mouth 2 (two) times daily.     . nitroGLYCERIN (NITROSTAT) 0.4 MG SL tablet Place 0.4 mg under the tongue every 5 (five) minutes as needed for chest pain (x 3 doses daily).    [name] oxybutynin (DITROPAN-XL) 5 MG 24 hr tablet Take 1 tablet (5 mg total) by mouth at bedtime. 90 tablet 1  . solifenacin (VESICARE) 5 MG tablet Take 1 tablet (5 mg total) by mouth daily. 90 tablet 3  . triamcinolone cream  (KENALOG) 0.1 % Apply topically.     No current facility-administered medications for this visit.    Allergies:   Cephalexin; Furacin; Macrodantin; and Zestril    Social History:  The patient  reports that he quit smoking about 39 years ago. He has quit using smokeless tobacco. He reports that he drinks about 4.2 oz of alcohol per week. He reports that he does not use illicit drugs.   Family History:  The patient's family history includes Cancer in his  father; Heart disease in his mother; Kidney disease in his mother; Mental illness in his brother; Stroke in his mother. There is no history of Diabetes, COPD, Heart attack, or Hypertension.    ROS:  Please see the history of present illness.   Otherwise, review of systems are positive for none.   All other systems are reviewed and negative.    PHYSICAL EXAM: VS:  BP 100/60 mmHg  Pulse 67  Ht 6\' 3"  (1.905 m)  Wt 212 lb (96.163 kg)  BMI 26.50 kg/m2  SpO2 97% , BMI Body mass index is 26.5 kg/(m^2). GEN: Well nourished, well developed, in no acute distress HEENT: normal Neck: no JVD, carotid bruits, or masses Cardiac: irreg irreg; no murmurs, rubs, or gallops, 1+ LE bilateral edema  Respiratory: left: decreased breath sounds, right some mild crackles GI: soft, nontender, nondistended, + BS MS: no deformity or atrophy Skin: warm and dry, no rash Neuro:  Strength and sensation are intact Psych: euthymic mood, full affect   EKG:  EKG is not ordered today.    Recent Labs: 01/10/2015: B Natriuretic Peptide 91.6; Magnesium 2.1 01/15/2015: TSH 2.449 01/20/2015: ALT 32 01/28/2015: BUN 15; Creatinine, Ser 0.92; Hemoglobin 11.9*; Platelets 280.0; Potassium 4.4; Sodium 137    Lipid Panel    Component Value Date/Time   CHOL 101 06/04/2014 1523   TRIG 45.0 06/04/2014 1523   HDL 43.10 06/04/2014 1523   CHOLHDL 2 06/04/2014 1523   VLDL 9.0 06/04/2014 1523   LDLCALC 49 06/04/2014 1523      Wt Readings from Last 3 Encounters:    03/13/15 212 lb (96.163 kg)  02/26/15 210 lb (95.255 kg)  02/18/15 210 lb 11.2 oz (95.573 kg)      Other studies Reviewed: Additional studies/ records that were reviewed today include: 2D ECHO Review of the above records demonstrates: see HPI   ASSESSMENT AND PLAN:  SKYLUR BEDNER is a 81 y.o. male 81 y.o. male with a history of bladder cancer, DM, GERD, chronic atrial fibrillation and recent pericardial effusion s/p pericardiocentesis and pleural effusions who presents to clinic for follow up.   1. Pericardial Effusion: s/p pericardiocentesis. No symptoms of worsening dyspnea, chest pain, syncope/ near syncope. VSS. Repeat limited ECHO shows trivial pericardial effusion.   2. Acute Respiratory Failure: 2/2 problem #1 and ? PNA. He has completed antibiotics. Respiratory status improved. Remains on 2L [place] at home but today in office on room air and 02 sats 97%. He can use PRN if needed.   3. Chronic Atrial Fibrillation: rate is well controlled. Not an anticoagulation candidate due to h/o GIB.  4. Pleural effusions/chronic diastolic CHF: worsening left pleural effusion on CXR. Felt to be transudative and 2/2 CHF per Dr. Lamonte Sakai. He has been on lasix 80mg  po AM, 40 pm (M, W, Fr). I am not convinced he is volume overloaded at this time. Will repeat a BMET, BNP and CXR and adjust lasix from there. This may be a good regimen for him as he is minimally symptomatic at this time  5. Dementia: he has good and bad days. On Memantine  Current medicines are reviewed at length with the patient today.  The patient does not have concerns regarding medicines.  The following changes have been made:  no change  Labs/ tests ordered today include:  No orders of the defined types were placed in this encounter.     Disposition:   FU with me on 03/26/14 and [name] early next year before he  retires.   Renea Ee  03/13/2015 3:33 PM    [facility] [address], [place], [place]  [address] Phone: [phone]; Fax: [phone]

## 2015-03-13 ENCOUNTER — Ambulatory Visit
Admission: RE | Admit: 2015-03-13 | Discharge: 2015-03-13 | Disposition: A | Discharge status: Home / Self Care | Payer: Medicare Other | Source: Ambulatory Visit | Attending: Physician Assistant | Admitting: Physician Assistant

## 2015-03-13 ENCOUNTER — Encounter: Payer: Self-pay | Admitting: Physician Assistant

## 2015-03-13 ENCOUNTER — Ambulatory Visit (INDEPENDENT_AMBULATORY_CARE_PROVIDER_SITE_OTHER): Payer: Medicare Other | Admitting: Physician Assistant

## 2015-03-13 VITALS — BP 100/60 | HR 67 | Ht 75.0 in | Wt 212.0 lb

## 2015-03-13 DIAGNOSIS — J90 Pleural effusion, not elsewhere classified: Secondary | ICD-10-CM

## 2015-03-13 DIAGNOSIS — I5033 Acute on chronic diastolic (congestive) heart failure: Secondary | ICD-10-CM | POA: Diagnosis not present

## 2015-03-13 DIAGNOSIS — I4891 Unspecified atrial fibrillation: Secondary | ICD-10-CM

## 2015-03-13 NOTE — Patient Instructions (Addendum)
Medication Instructions:  Your physician recommends that you continue on your current medications as directed. Please refer to the Current Medication list given to you today.   Labwork: TODAY:  BNP & BMET  Testing/Procedures: A chest x-ray takes a picture of the organs and structures inside the chest, including the heart, lungs, and blood vessels. This test can show several things, including, whether the heart is enlarges; whether fluid is building up in the lungs; and whether pacemaker / defibrillator leads are still in place. Report to [facility], [place]. [address] [facility]).   You do not have to have an appointment from 8:30 - 4:30   Follow-Up: Your physician recommends that you schedule a follow-up appointment with Eduardo Range, PA- for 03/27/15 and  keep your scheduled follow-up appointment with [name] 05-12-2015.   Any Other Special Instructions Will Be Listed Below (If Applicable).     If you need a refill on your cardiac medications before your next appointment, please call your pharmacy.

## 2015-03-14 DIAGNOSIS — R1310 Dysphagia, unspecified: Secondary | ICD-10-CM | POA: Diagnosis not present

## 2015-03-14 DIAGNOSIS — I10 Essential (primary) hypertension: Secondary | ICD-10-CM | POA: Diagnosis not present

## 2015-03-14 DIAGNOSIS — I482 Chronic atrial fibrillation: Secondary | ICD-10-CM | POA: Diagnosis not present

## 2015-03-14 DIAGNOSIS — I5022 Chronic systolic (congestive) heart failure: Secondary | ICD-10-CM | POA: Diagnosis not present

## 2015-03-14 DIAGNOSIS — E1140 Type 2 diabetes mellitus with diabetic neuropathy, unspecified: Secondary | ICD-10-CM | POA: Diagnosis not present

## 2015-03-14 DIAGNOSIS — F0390 Unspecified dementia without behavioral disturbance: Secondary | ICD-10-CM | POA: Diagnosis not present

## 2015-03-17 DIAGNOSIS — I10 Essential (primary) hypertension: Secondary | ICD-10-CM | POA: Diagnosis not present

## 2015-03-17 DIAGNOSIS — I5022 Chronic systolic (congestive) heart failure: Secondary | ICD-10-CM | POA: Diagnosis not present

## 2015-03-17 DIAGNOSIS — E1140 Type 2 diabetes mellitus with diabetic neuropathy, unspecified: Secondary | ICD-10-CM | POA: Diagnosis not present

## 2015-03-17 DIAGNOSIS — R1310 Dysphagia, unspecified: Secondary | ICD-10-CM | POA: Diagnosis not present

## 2015-03-17 DIAGNOSIS — I482 Chronic atrial fibrillation: Secondary | ICD-10-CM | POA: Diagnosis not present

## 2015-03-17 DIAGNOSIS — F0390 Unspecified dementia without behavioral disturbance: Secondary | ICD-10-CM | POA: Diagnosis not present

## 2015-03-19 DIAGNOSIS — F0390 Unspecified dementia without behavioral disturbance: Secondary | ICD-10-CM | POA: Diagnosis not present

## 2015-03-19 DIAGNOSIS — I482 Chronic atrial fibrillation: Secondary | ICD-10-CM | POA: Diagnosis not present

## 2015-03-19 DIAGNOSIS — I10 Essential (primary) hypertension: Secondary | ICD-10-CM | POA: Diagnosis not present

## 2015-03-19 DIAGNOSIS — I5022 Chronic systolic (congestive) heart failure: Secondary | ICD-10-CM | POA: Diagnosis not present

## 2015-03-19 DIAGNOSIS — E1140 Type 2 diabetes mellitus with diabetic neuropathy, unspecified: Secondary | ICD-10-CM | POA: Diagnosis not present

## 2015-03-19 DIAGNOSIS — R1310 Dysphagia, unspecified: Secondary | ICD-10-CM | POA: Diagnosis not present

## 2015-03-20 ENCOUNTER — Other Ambulatory Visit (INDEPENDENT_AMBULATORY_CARE_PROVIDER_SITE_OTHER): Payer: Medicare Other | Admitting: *Deleted

## 2015-03-20 DIAGNOSIS — I119 Hypertensive heart disease without heart failure: Secondary | ICD-10-CM | POA: Diagnosis not present

## 2015-03-20 LAB — HEPATIC FUNCTION PANEL
ALBUMIN: 3.4 g/dL — AB (ref 3.6–5.1)
ALK PHOS: 81 U/L (ref 40–115)
ALT: 10 U/L (ref 9–46)
AST: 17 U/L (ref 10–35)
BILIRUBIN TOTAL: 0.5 mg/dL (ref 0.2–1.2)
Bilirubin, Direct: 0.1 mg/dL (ref <=0.2)
Indirect Bilirubin: 0.4 mg/dL (ref 0.2–1.2)
TOTAL PROTEIN: 6.5 g/dL (ref 6.1–8.1)

## 2015-03-20 LAB — CBC
HCT: 36.7 % — ABNORMAL LOW (ref 39.0–52.0)
HEMOGLOBIN: 11.3 g/dL — AB (ref 13.0–17.0)
MCH: 26 pg (ref 26.0–34.0)
MCHC: 30.8 g/dL (ref 30.0–36.0)
MCV: 84.4 fL (ref 78.0–100.0)
MPV: 9.1 fL (ref 8.6–12.4)
PLATELETS: 192 10*3/uL (ref 150–400)
RBC: 4.35 MIL/uL (ref 4.22–5.81)
RDW: 19.8 % — ABNORMAL HIGH (ref 11.5–15.5)
WBC: 5.5 10*3/uL (ref 4.0–10.5)

## 2015-03-20 LAB — BASIC METABOLIC PANEL
BUN: 10 mg/dL (ref 7–25)
CHLORIDE: 96 mmol/L — AB (ref 98–110)
CO2: 31 mmol/L (ref 20–31)
CREATININE: 0.74 mg/dL (ref 0.70–1.11)
Calcium: 8.8 mg/dL (ref 8.6–10.3)
Glucose, Bld: 127 mg/dL — ABNORMAL HIGH (ref 65–99)
POTASSIUM: 3.7 mmol/L (ref 3.5–5.3)
SODIUM: 138 mmol/L (ref 135–146)

## 2015-03-20 LAB — LIPID PANEL
CHOL/HDL RATIO: 2.6 ratio (ref <=5.0)
Cholesterol: 125 mg/dL (ref 125–200)
HDL: 48 mg/dL (ref >=40)
LDL CALC: 64 mg/dL (ref <130)
TRIGLYCERIDES: 66 mg/dL (ref <150)
VLDL: 13 mg/dL (ref <30)

## 2015-03-21 DIAGNOSIS — E1140 Type 2 diabetes mellitus with diabetic neuropathy, unspecified: Secondary | ICD-10-CM | POA: Diagnosis not present

## 2015-03-21 DIAGNOSIS — I10 Essential (primary) hypertension: Secondary | ICD-10-CM | POA: Diagnosis not present

## 2015-03-21 DIAGNOSIS — F0390 Unspecified dementia without behavioral disturbance: Secondary | ICD-10-CM | POA: Diagnosis not present

## 2015-03-21 DIAGNOSIS — I482 Chronic atrial fibrillation: Secondary | ICD-10-CM | POA: Diagnosis not present

## 2015-03-21 DIAGNOSIS — R1310 Dysphagia, unspecified: Secondary | ICD-10-CM | POA: Diagnosis not present

## 2015-03-21 DIAGNOSIS — I5022 Chronic systolic (congestive) heart failure: Secondary | ICD-10-CM | POA: Diagnosis not present

## 2015-03-21 NOTE — Progress Notes (Signed)
Quick Note:  Please report to patient. The recent labs are stable. Continue same medication and careful diet. ______ 

## 2015-03-24 DIAGNOSIS — I5022 Chronic systolic (congestive) heart failure: Secondary | ICD-10-CM | POA: Diagnosis not present

## 2015-03-24 DIAGNOSIS — F0390 Unspecified dementia without behavioral disturbance: Secondary | ICD-10-CM | POA: Diagnosis not present

## 2015-03-24 DIAGNOSIS — I482 Chronic atrial fibrillation: Secondary | ICD-10-CM | POA: Diagnosis not present

## 2015-03-24 DIAGNOSIS — R1310 Dysphagia, unspecified: Secondary | ICD-10-CM | POA: Diagnosis not present

## 2015-03-24 DIAGNOSIS — I10 Essential (primary) hypertension: Secondary | ICD-10-CM | POA: Diagnosis not present

## 2015-03-24 DIAGNOSIS — E1140 Type 2 diabetes mellitus with diabetic neuropathy, unspecified: Secondary | ICD-10-CM | POA: Diagnosis not present

## 2015-03-25 ENCOUNTER — Encounter: Payer: Self-pay | Admitting: Infectious Diseases

## 2015-03-26 ENCOUNTER — Telehealth: Payer: Self-pay | Admitting: *Deleted

## 2015-03-26 ENCOUNTER — Inpatient Hospital Stay: Payer: Medicare Other | Admitting: Infectious Disease

## 2015-03-26 NOTE — Telephone Encounter (Signed)
Agree 

## 2015-03-26 NOTE — Progress Notes (Signed)
Cardiology Office Note   Date:  03/27/2015   ID:  [name], DOB 07-11-1927, MRN [id number]  PCP:  [name], NP  Cardiologist: [name]  Follow up- pleural effusion/CHF   History of Present Illness: [name] is a 82 y.o. male 82 y.o. male with a history of bladder cancer, DM, GERD, chronic atrial fibrillation, pleural effusion and recent pericardial effusion s/p pericardiocentesis who presents to clinic for follow up.   He is followed [name] with known history of chronic established atrial fibrillation. He is not on Coumadin because of recurrent GI bleeds.   He had a cardiac catheterization on 08/07/01 showing normal coronary arteries and an ejection fraction of 60-65%. He had an echocardiogram on 04/21/11 showing an ejection fraction of 60-65%, moderate mitral regurgitation, mild aortic insufficiency, moderate tricuspid regurgitation, and pulmonary artery pressure was 46 mmHg.  Previous notes state that hey had chronic mild pretibial edema as well as chronic mild pulmonary vascular congestion with basilar rales.  He had a Lexiscan Myoview on 01/23/13 showing no ischemia.  The study was not gated because of his atrial fibrillation.The patient had an echocardiogram 05/30/13 showing an ejection fraction of 55-60%.  He was recently admitted to the hospitalist service on 01/10/15 for sepsis, acute respiratory failure, and suspected PNA.  He presented to the ED with shortness of breath, tachycardia, tachypnea, and hypoxia.  He had a CXR with suspected pericardial effusion and cardiomegaly.  TTE revealed large circumferential effusion w/o tamponade. On 01/13/15, he underwent a pericardiocentesis by Dr. Ellyn Hack. He also has a left pleural effusion and underwent thoracentesis. Workup per infectious disease for pericardial effusion/pleural effusion did not reveal any infectious etiology, no malignant cells identified in the left pleural effusion. It was felt  to be transudative.   Repeat 2D echo showed an EF of 45-50% with small to moderate pericardial effusion. Repeat CXR was stable. Once stable for discharge, he was sent home with home health services and home O2. He was discharged home on 2 L [place].   He was seen in clinic by Ellen Henri PA-C on 02/06/15 for post hospital follow up. He was doing better but remained on 2L 02. He felt like he was getting stronger. She arranged for a follow up limited 2D ECHO which was done on 03/11/15 and showed a trivial pericardial effusion with right/left pleural effusion.     He was discharged on lasix 80mg  daily (up from his usual 60mg ). He underwent repeat CXR 02/20/15 that Dr. Lamonte Sakai reviewed which  showed some increase in size of the L effusion compared with discharge film. He felt this was due to CHF. He increased lasix to 80 mg q AM and 40mg  q PM (M, W, Fr).  He wanted him to be seen back by cardiology to manage lasix.   I saw him in clinic on  05/13/14 at the request of Dr. Lamonte Sakai. He was not wearing 02  (only at night) and was doing pretty well in terms of breathing. I ordered a BMET, BNP and repeat CXR. Labs were not drawn, but he did get the repeat CXR which showed slightly improved L pleural effusion. I kept him on his same dose of lasix  80 mg q AM and 40mg  q PM (M, W, Fr).   Today he presents for follow up. He is doing well. He has no complaints. No CP or SOB, orthopnea, LE edema or PND. He does have a mild cough at night when lying flat.  No dizziness for PND. He has not needed home 02.    Past Medical History  Diagnosis Date  . Arrhythmia     a fib  . ED (erectile dysfunction)   . Memory disorder   . 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 Hematochezia     has intermittent problem  . Arthritis     DJD knee - left  . Restless leg     treats with heat  . Diabetes mellitus     NIDDM on oral medication  . Genital warts     recurrent. Not a problem  . GERD (gastroesophageal reflux disease)     treated  with PPI  . Hypertension     mild  . Neuropathy (HCC)     burning discomfort feet/legs  . Irritable bladder   . Hard of hearing   . Bladder cancer (HCC)     BCG [name]    Past Surgical History  Procedure Laterality Date  . Cardiovascular stress test  03/28/2003    EF 55%.  Normal cardiolite study  . Appendectomy  1946  . Tonsillectomy  1938  . Joint replacement  1990's    left TKR  . Knee arthroscopy  1980's    right knee  . Cardiac catheterization N/A 01/13/2015    Procedure: Pericardiocentesis;  Surgeon: Leonie Man, MD;  Location: [facility];  Service: Cardiovascular;  Laterality: N/A;     Current Outpatient Prescriptions  Medication Sig Dispense Refill  . aspirin 81 MG tablet Take 81 mg by mouth daily.    [name] donepezil (ARICEPT) 10 MG tablet Take 1 tablet (10 mg total) by mouth at bedtime. 90 tablet 3  . ferrous sulfate 325 (65 FE) MG tablet Take 487.5 mg by mouth 2 (two) times daily.     . folic acid (FOLVITE) 1 MG tablet Take 1 mg by mouth 2 (two) times daily.     . furosemide (LASIX) 40 MG tablet Take 80 mg by mouth every morning. M-W-F- TAKE 40 MG ADDITIONAL  EACH NIGHT    . gabapentin (NEURONTIN) 100 MG capsule Take 100 mg by mouth at bedtime.    . hydrOXYzine (ATARAX/VISTARIL) 10 MG tablet Take 5 mg by mouth 3 (three) times daily as needed for itching (itching).     [name] levofloxacin (LEVAQUIN) 750 MG tablet Take 1 tablet (750 mg total) by mouth daily. 7 tablet 0  . memantine (NAMENDA XR) 28 MG CP24 24 hr capsule Take 1 capsule (28 mg total) by mouth daily. 90 capsule 3  . metoprolol tartrate (LOPRESSOR) 25 MG tablet Take 25 mg by mouth 2 (two) times daily.     . Multiple Vitamin (MULTIVITAMIN WITH MINERALS) TABS tablet Take 1 tablet by mouth daily.    . Multiple Vitamins-Minerals (PRESERVISION AREDS 2 PO) Take 1 tablet by mouth 2 (two) times daily.     . nitroGLYCERIN (NITROSTAT) 0.4 MG SL tablet Place 0.4 mg under the tongue every 5 (five) minutes as  needed for chest pain (x 3 doses daily).    [name] oxybutynin (DITROPAN-XL) 5 MG 24 hr tablet Take 1 tablet (5 mg total) by mouth at bedtime. 90 tablet 1  . solifenacin (VESICARE) 5 MG tablet Take 1 tablet (5 mg total) by mouth daily. 90 tablet 3  . triamcinolone cream (KENALOG) 0.1 % Apply 1 application topically daily.      No current facility-administered medications for this visit.    Allergies:  Cephalexin; Furacin; Macrodantin; and Zestril    Social History:  The patient  reports that he quit smoking about 39 years ago. He has quit using smokeless tobacco. He reports that he drinks about 4.2 oz of alcohol per week. He reports that he does not use illicit drugs.   Family History:  The patient's family history includes Cancer in his father; Heart disease in his mother; Kidney disease in his mother; Mental illness in his brother; Stroke in his mother. There is no history of Diabetes, COPD, Heart attack, or Hypertension.    ROS:  Please see the history of present illness.   Otherwise, review of systems are positive for none.   All other systems are reviewed and negative.    PHYSICAL EXAM: VS:  BP 130/56 mmHg  Pulse 93  Ht 6\' 3"  (1.905 m)  Wt 213 lb (96.616 kg)  BMI 26.62 kg/m2 , BMI Body mass index is 26.62 kg/(m^2). GEN: Well nourished, well developed, in no acute distress HEENT: normal Neck: no JVD, carotid bruits, or masses Cardiac: irreg irreg; no murmurs, rubs, or gallops, No LE edema  Respiratory: left: decreased breath sounds, bilateral mild crackles GI: soft, nontender, nondistended, + BS MS: no deformity or atrophy Skin: warm and dry, no rash Neuro:  Strength and sensation are intact Psych: euthymic mood, full affect   EKG:  EKG is not ordered today.    Recent Labs: 01/10/2015: B Natriuretic Peptide 91.6; Magnesium 2.1 01/15/2015: TSH 2.449 03/20/2015: ALT 10; BUN 10; Creat 0.74; Hemoglobin 11.3*; Platelets 192; Potassium 3.7; Sodium 138    Lipid Panel      Component Value Date/Time   CHOL 125 03/20/2015 1259   TRIG 66 03/20/2015 1259   HDL 48 03/20/2015 1259   CHOLHDL 2.6 03/20/2015 1259   VLDL 13 03/20/2015 1259   LDLCALC 64 03/20/2015 1259      Wt Readings from Last 3 Encounters:  03/27/15 213 lb (96.616 kg)  03/13/15 212 lb (96.163 kg)  02/26/15 210 lb (95.255 kg)      Other studies Reviewed: Additional studies/ records that were reviewed today include: 2D ECHO Review of the above records demonstrates: see HPI   ASSESSMENT AND PLAN:  TAELYN AMAN is a 82 y.o. male 82 y.o. male with a history of bladder cancer, DM, GERD, chronic atrial fibrillation and recent pericardial effusion s/p pericardiocentesis and pleural effusions who presents to clinic for follow up.   Pericardial Effusion: s/p pericardiocentesis. No symptoms of worsening dyspnea, chest pain, syncope/ near syncope. VSS. Repeat limited ECHO showed trivial pericardial effusion.   Chronic Atrial Fibrillation: rate is well controlled. HR 93. CHADSVASC score of at least 4 (CHF, HTN, Age). Not on anticoagulation candidate due to h/o GIB.  Pleural effusions/chronic diastolic CHF: left pleural effusion on CXR. Felt to be transudative and 2/2 CHF per Dr. Lamonte Sakai. Repeat CXR on 03/13/15 with slight improvement. He has been on lasix 80mg  po AM, 40 pm (M, W, Fr) Recent labs with stable creat/K. He is doing quite well. I think we can continue this Lasix dosing regimen.   Dementia: he has good and bad days. On Memantine  Current medicines are reviewed at length with the patient today.  The patient does not have concerns regarding medicines.  The following changes have been made:  no change  Labs/ tests ordered today include:  No orders of the defined types were placed in this encounter.     Disposition:   FU with [name] 05/12/15 as previously  scheduled.   Renea Ee  03/27/2015 2:03 PM    [facility] [place],  [place], [place]  [address] Phone: [phone]; Fax: [phone]

## 2015-03-26 NOTE — Telephone Encounter (Signed)
Patient son called to advise that the patient is not coming to his appt today and does not feel he needs to be seen here at all. Advised him will let the doctor know.

## 2015-03-27 ENCOUNTER — Ambulatory Visit (INDEPENDENT_AMBULATORY_CARE_PROVIDER_SITE_OTHER): Payer: Medicare Other | Admitting: Physician Assistant

## 2015-03-27 ENCOUNTER — Encounter: Payer: Self-pay | Admitting: Physician Assistant

## 2015-03-27 VITALS — BP 130/56 | HR 93 | Ht 75.0 in | Wt 213.0 lb

## 2015-03-27 DIAGNOSIS — I5032 Chronic diastolic (congestive) heart failure: Secondary | ICD-10-CM

## 2015-03-27 NOTE — Patient Instructions (Addendum)
Medication Instructions:  Your physician recommends that you continue on your current medications as directed. Please refer to the Current Medication list given to you today.   Labwork: None ordered   Testing/Procedures: None ordered  Follow-Up: Your physician recommends that you keep your scheduled follow-up appointment with [name] 05/12/15 @ 3:15.   Any Other Special Instructions Will Be Listed Below (If Applicable).     If you need a refill on your cardiac medications before your next appointment, please call your pharmacy.

## 2015-03-31 DIAGNOSIS — I482 Chronic atrial fibrillation: Secondary | ICD-10-CM | POA: Diagnosis not present

## 2015-03-31 DIAGNOSIS — E1140 Type 2 diabetes mellitus with diabetic neuropathy, unspecified: Secondary | ICD-10-CM | POA: Diagnosis not present

## 2015-03-31 DIAGNOSIS — F0390 Unspecified dementia without behavioral disturbance: Secondary | ICD-10-CM | POA: Diagnosis not present

## 2015-03-31 DIAGNOSIS — I10 Essential (primary) hypertension: Secondary | ICD-10-CM | POA: Diagnosis not present

## 2015-03-31 DIAGNOSIS — R1310 Dysphagia, unspecified: Secondary | ICD-10-CM | POA: Diagnosis not present

## 2015-03-31 DIAGNOSIS — I5022 Chronic systolic (congestive) heart failure: Secondary | ICD-10-CM | POA: Diagnosis not present

## 2015-04-01 DIAGNOSIS — I5022 Chronic systolic (congestive) heart failure: Secondary | ICD-10-CM | POA: Diagnosis not present

## 2015-04-01 DIAGNOSIS — R1310 Dysphagia, unspecified: Secondary | ICD-10-CM | POA: Diagnosis not present

## 2015-04-01 DIAGNOSIS — I10 Essential (primary) hypertension: Secondary | ICD-10-CM | POA: Diagnosis not present

## 2015-04-01 DIAGNOSIS — F0390 Unspecified dementia without behavioral disturbance: Secondary | ICD-10-CM | POA: Diagnosis not present

## 2015-04-01 DIAGNOSIS — I482 Chronic atrial fibrillation: Secondary | ICD-10-CM | POA: Diagnosis not present

## 2015-04-01 DIAGNOSIS — E1140 Type 2 diabetes mellitus with diabetic neuropathy, unspecified: Secondary | ICD-10-CM | POA: Diagnosis not present

## 2015-04-03 ENCOUNTER — Other Ambulatory Visit: Payer: Self-pay | Admitting: Cardiology

## 2015-04-03 ENCOUNTER — Telehealth: Payer: Self-pay | Admitting: Cardiology

## 2015-04-03 ENCOUNTER — Telehealth: Payer: Self-pay | Admitting: Neurology

## 2015-04-03 MED ORDER — FUROSEMIDE 40 MG PO TABS: 80.0000 mg | ORAL_TABLET | Freq: Every morning | ORAL | Status: DC

## 2015-04-03 NOTE — Telephone Encounter (Signed)
[name] called sts pt is currently taking hydrOXYzine (ATARAX/VISTARIL) 10 MG tablet 1/2 tab in am and 1/2 tab in pm. He has done very well over the past few months but he has started itching and scratching over the past week. He said pt was taking 1 tab in am and 1 tab in pm but the family decreased dose a couple months ago and is inquiring if it can be increased again. He has decided to increase to 1 tab in am and 1 tab in pm as directed by RX.

## 2015-04-03 NOTE — Telephone Encounter (Signed)
°*  STAT* If patient is at the pharmacy, call can be transferred to refill team.   1. Which medications need to be refilled? (please list name of each medication and dose if known) Furosemide   40mg   2. Which pharmacy/location (including street and city if local pharmacy) is medication to be sent to? [facility] in [employer]  3. Do they need a 30 day or 90 day supply? [place]

## 2015-04-03 NOTE — Telephone Encounter (Signed)
Pt's Rx was sent to pt's pharmacy as requested. 30 days supply, pt has an appointment with [name] on 05/12/15. Rx might be changing. Confirmation received.

## 2015-04-04 ENCOUNTER — Telehealth: Payer: Self-pay | Admitting: Adult Health

## 2015-04-04 NOTE — Telephone Encounter (Signed)
I called and spoke to the caregiver, Deidra. I left a message with her to have the family contact Dr. Pricilla Holm. It appears she was the last doctor to order Atarax for this patient.

## 2015-04-04 NOTE — Telephone Encounter (Signed)
[facility] [place] [facility] - Client [place] Call Center Patient Name: [name] DOB: 1927/04/02 Initial Comment Caller states, father was prescribed hydroxizine to help him stop itching. It helps a lot, but it also affects his memory. He has started itching again, now son has questions about dosing. ; he sees Beaulah Dinning PA, Nurse Assessment Nurse: Markus Daft, RN, Sherre Poot Date/Time (Eastern Time): 04/04/2015 1:50:51 PM Confirm and document reason for call. If symptomatic, describe symptoms. You must click the next button to save text entered. ---Caller states that his father was prescribed hydroxizine to help him stop itching. Started about a year ago. He has h/o dementia and told that this was part of it. It helps a lot, but it also affects his memory. They are giving 10 mg and giving 1/2 tab BID. It can be given as prescribed 10 mg - 1/2 tab TID. He has started itching again especially at night, and with cutting it back to BID then that helped his memory too. Is there anything better that can be given that won't affect the memory so much? Or should they go ahead and increase to TID? Has the patient traveled out of the country within the last 30 days? ---Not Applicable Does the patient have any new or worsening symptoms? ---Yes Will a triage be completed? ---Yes Related visit to physician within the last 2 weeks? ---No Does the PT have any chronic conditions? (i.e. diabetes, asthma, etc.) ---Yes List chronic conditions. ---Dementia - 3-4 years now (reduced the med in last 2 months and he is more alert); HTN; NIDDM Is this a behavioral health or substance abuse call? ---No Guidelines Guideline Title Affirmed Question Affirmed Notes Itching - Widespread Itching is a chronic symptom (recurrent or ongoing AND present > 4 weeks) Final Disposition User See PCP within [place], RN, Sherre Poot Comments They will go back to taking his med  as prescribed, and call for appt. in next 2 wks. Disagree/Comply: Comply

## 2015-04-04 NOTE — Telephone Encounter (Signed)
Patient's son Gerald Stabs called. I advised the him of the message from 04-03-15 regarding medication Atarax for the patient. Gerald Stabs will contact Dr. Pricilla Holm to discuss.

## 2015-04-07 DIAGNOSIS — I482 Chronic atrial fibrillation: Secondary | ICD-10-CM | POA: Diagnosis not present

## 2015-04-07 DIAGNOSIS — I10 Essential (primary) hypertension: Secondary | ICD-10-CM | POA: Diagnosis not present

## 2015-04-07 DIAGNOSIS — E1140 Type 2 diabetes mellitus with diabetic neuropathy, unspecified: Secondary | ICD-10-CM | POA: Diagnosis not present

## 2015-04-07 DIAGNOSIS — R1310 Dysphagia, unspecified: Secondary | ICD-10-CM | POA: Diagnosis not present

## 2015-04-07 DIAGNOSIS — F0390 Unspecified dementia without behavioral disturbance: Secondary | ICD-10-CM | POA: Diagnosis not present

## 2015-04-07 DIAGNOSIS — I5022 Chronic systolic (congestive) heart failure: Secondary | ICD-10-CM | POA: Diagnosis not present

## 2015-04-14 DIAGNOSIS — I10 Essential (primary) hypertension: Secondary | ICD-10-CM | POA: Diagnosis not present

## 2015-04-14 DIAGNOSIS — F0390 Unspecified dementia without behavioral disturbance: Secondary | ICD-10-CM | POA: Diagnosis not present

## 2015-04-14 DIAGNOSIS — R1310 Dysphagia, unspecified: Secondary | ICD-10-CM | POA: Diagnosis not present

## 2015-04-14 DIAGNOSIS — I5022 Chronic systolic (congestive) heart failure: Secondary | ICD-10-CM | POA: Diagnosis not present

## 2015-04-14 DIAGNOSIS — E1140 Type 2 diabetes mellitus with diabetic neuropathy, unspecified: Secondary | ICD-10-CM | POA: Diagnosis not present

## 2015-04-14 DIAGNOSIS — I482 Chronic atrial fibrillation: Secondary | ICD-10-CM | POA: Diagnosis not present

## 2015-04-15 ENCOUNTER — Telehealth: Payer: Self-pay | Admitting: Adult Health

## 2015-04-15 NOTE — Telephone Encounter (Signed)
Please review and advise.  He does have a pending appt with Pulmonology on 05/01/2015.

## 2015-04-15 NOTE — Telephone Encounter (Signed)
Pt call to say [facility] need d/c order for oxygen machine   Pt will see if he can get a fax number

## 2015-04-16 NOTE — Telephone Encounter (Signed)
Ok to discontinue home oxygen

## 2015-04-16 NOTE — Telephone Encounter (Signed)
He is still in need of a discharge order to remove the oxygen machine. The order needs to be faxed to [facility] at [phone].

## 2015-04-16 NOTE — Telephone Encounter (Signed)
Called and spoke with pt's spouse and she states that pt had not used the oxygen machine in 2 weeks.  Per wife pt has not experienced any SOB and the o2 machine is in the way.

## 2015-04-18 NOTE — Telephone Encounter (Signed)
[facility]ed to [facility] to d/c [facility].  Confirmation fax  received.

## 2015-05-01 ENCOUNTER — Other Ambulatory Visit: Payer: Self-pay | Admitting: Cardiology

## 2015-05-01 ENCOUNTER — Encounter: Payer: Self-pay | Admitting: Emergency Medicine

## 2015-05-01 ENCOUNTER — Ambulatory Visit (INDEPENDENT_AMBULATORY_CARE_PROVIDER_SITE_OTHER): Payer: Medicare Other | Admitting: Emergency Medicine

## 2015-05-01 VITALS — BP 140/80 | HR 81 | Wt 201.0 lb

## 2015-05-01 DIAGNOSIS — J90 Pleural effusion, not elsewhere classified: Secondary | ICD-10-CM

## 2015-05-01 NOTE — Progress Notes (Signed)
Subjective:    Patient ID: [name], male    DOB: 1928-03-18, 82 y.o.   MRN: [id number]  HPI 82 yo man, former smoker, with a hx of A fib, DM, GERD, DJD. He is referred by C Nafiziger. He was admitted for pericardial effusion and pleural effusion in 10/21-10/31. He was treated empirically for CAP at that time although the effusion was a transudate. Went home from hospital on lasix 80mg  daily (up from his usual 60mg ).  He underwent repeat CXR 21/1/16 that I have reviewed showed some increase in size of the L effusion compared with discharge film. Based on this he was started on empiric abx. He is following with Dr Mare Ferrari also, has had his weight and fluid status monitored. His lasix was just increased 3 days ago to 80mg  am, 40mg  in the pm 2 days out of the week.   ROV 05/01/15 -- follow-up visit for atrial fibrillation and history of pericardial effusion and a transudative left pleural effusion felt to be related to volume status and diastolic CHF with some degree of pulmonary hypertension. He is now taking an additional lasix 40mg  in the evening on MWF. He tells me that he feels better, has more energy, less SOB since that change was made.    Review of Systems  Constitutional: Negative for fever and unexpected weight change.  HENT: Negative for congestion, dental problem, ear pain, nosebleeds, postnasal drip, rhinorrhea, sinus pressure, sneezing, sore throat and trouble swallowing.   Eyes: Negative for redness and itching.  Respiratory: Positive for cough and shortness of breath. Negative for chest tightness and wheezing.   Cardiovascular: Positive for palpitations. Negative for leg swelling.  Gastrointestinal: Negative for nausea and vomiting.  Genitourinary: Negative for dysuria.  Musculoskeletal: Negative for joint swelling.  Skin: Negative for rash.  Neurological: Negative for headaches.  Hematological: Does not bruise/bleed easily.  Psychiatric/Behavioral: Negative for dysphoric  mood. The patient is not nervous/anxious.    Past Medical History  Diagnosis Date  . Arrhythmia     a fib  . ED (erectile dysfunction)   . Memory disorder   . 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 Hematochezia     has intermittent problem  . Arthritis     DJD knee - left  . Restless leg     treats with heat  . Diabetes mellitus     NIDDM on oral medication  . Genital warts     recurrent. Not a problem  . GERD (gastroesophageal reflux disease)     treated with PPI  . Hypertension     mild  . Neuropathy (HCC)     burning discomfort feet/legs  . Irritable bladder   . Hard of hearing   . Bladder cancer (HCC)     BCG [name]     Family History  Problem Relation Age of Onset  . Kidney disease Mother   . Heart disease Mother   . Stroke Mother   . Cancer Father     prostate  . Diabetes Neg Hx   . COPD Neg Hx   . Mental illness Brother   . Heart attack Neg Hx   . Hypertension Neg Hx      Social History   Social History  . Marital Status: Married    Spouse Name: [name]  . Number of Children: 4  . Years of Education: 10th    Occupational History  . retired    Science writer  History Main Topics  . Smoking status: Former Smoker    Quit date: 08/28/1975  . Smokeless tobacco: Former Systems developer  . Alcohol Use: 4.2 oz/week    7 Shots of liquor per week  . Drug Use: No  . Sexual Activity: Not Currently   Other Topics Concern  . Not on file   Social History Narrative   Patient lives at home with his wife [name]) Married 50 years.   Retired   Education 10 th grade   Right handed   Caffeine one or two daily      Allergies  Allergen Reactions  . Cephalexin Other (See Comments)    Unknown reaction  . Furacin [Nitrofurazone] Other (See Comments)    Unknown reaction  . Macrodantin Other (See Comments)    Unknown reaction  . Zestril [Lisinopril] Other (See Comments)    Unknown reaction     Outpatient Prescriptions Prior to Visit  Medication Sig  Dispense Refill  . aspirin 81 MG tablet Take 81 mg by mouth daily.    [name] donepezil (ARICEPT) 10 MG tablet Take 1 tablet (10 mg total) by mouth at bedtime. 90 tablet 3  . ferrous sulfate 325 (65 FE) MG tablet Take 487.5 mg by mouth 2 (two) times daily.     . folic acid (FOLVITE) 1 MG tablet Take 1 mg by mouth 2 (two) times daily.     . furosemide (LASIX) 40 MG tablet Take two tablets by mouth every morning. Take an additional tablet by mouth in the evening on Mon, Wed and Fri 75 tablet 10  . gabapentin (NEURONTIN) 100 MG capsule Take 100 mg by mouth at bedtime.    . hydrOXYzine (ATARAX/VISTARIL) 10 MG tablet Take 5 mg by mouth 3 (three) times daily as needed for itching (itching).     [name] levofloxacin (LEVAQUIN) 750 MG tablet Take 1 tablet (750 mg total) by mouth daily. 7 tablet 0  . memantine (NAMENDA XR) 28 MG CP24 24 hr capsule Take 1 capsule (28 mg total) by mouth daily. 90 capsule 3  . metoprolol tartrate (LOPRESSOR) 25 MG tablet Take 25 mg by mouth 2 (two) times daily.     . Multiple Vitamin (MULTIVITAMIN WITH MINERALS) TABS tablet Take 1 tablet by mouth daily.    . Multiple Vitamins-Minerals (PRESERVISION AREDS 2 PO) Take 1 tablet by mouth 2 (two) times daily.     . nitroGLYCERIN (NITROSTAT) 0.4 MG SL tablet Place 0.4 mg under the tongue every 5 (five) minutes as needed for chest pain (x 3 doses daily).    [name] oxybutynin (DITROPAN-XL) 5 MG 24 hr tablet Take 1 tablet (5 mg total) by mouth at bedtime. 90 tablet 1  . solifenacin (VESICARE) 5 MG tablet Take 1 tablet (5 mg total) by mouth daily. 90 tablet 3  . triamcinolone cream (KENALOG) 0.1 % Apply 1 application topically daily.      No facility-administered medications prior to visit.         Objective:   Physical Exam Filed Vitals:   05/01/15 1536  BP: 140/80  Pulse: 81  Weight: 201 lb (91.173 kg)  SpO2: 92%   Gen: Pleasant, elderly man.  in no distress,  normal affect  ENT: No lesions,  mouth clear,  oropharynx clear, no  postnasal drip  Neck: No JVD, no TMG, no carotid bruits  Lungs: No use of accessory muscles, very decreased breath sounds on the left about one-third of the way up, basilar crackles on the right. Dullness to  percussion about one third of the way up the left hemithorax  Cardiovascular: RRR, heart sounds normal, no murmur or gallops, trace peripheral edema  Musculoskeletal: No deformities, no cyanosis or clubbing  Neuro: alert, non focal  Skin: Warm, no lesions or rashes      Assessment & Plan:  Pleural effusion Pleural effusion that was a transudate on remote thoracentesis and which is likely due to his overall volume status. His breathing, volume status, energy level, are all better corresponding with his increase in Lasix to his current dose. Likewise his effusion appears to be smaller on physical exam. I will defer a chest x-ray at this time but he certainly could have one checked at his next cardiology visit. I've asked him to continue his furosemide as directed by cardiology. I will follow with him as needed. We will try to avoid a thoracentesis at all possible but this is an option if he needs one for therapeutic reasons in the future.

## 2015-05-01 NOTE — Assessment & Plan Note (Signed)
Pleural effusion that was a transudate on remote thoracentesis and which is likely due to his overall volume status. His breathing, volume status, energy level, are all better corresponding with his increase in Lasix to his current dose. Likewise his effusion appears to be smaller on physical exam. I will defer a chest x-ray at this time but he certainly could have one checked at his next cardiology visit. I've asked him to continue his furosemide as directed by cardiology. I will follow with him as needed. We will try to avoid a thoracentesis at all possible but this is an option if he needs one for therapeutic reasons in the future.

## 2015-05-01 NOTE — Patient Instructions (Signed)
Please continue your furosemide as directed by the cardiology clinic Follow up with cardiology as planned. You will need a chest x-ray at that visit. We will not perform a chest x-ray today Suspect that the amount of fluid around the left lung has decreased compared with your last visit There is no reason to drain any of that fluid at this time. We can consider this in the future if things change. Follow with Dr Lamonte Sakai if needed.

## 2015-05-12 ENCOUNTER — Telehealth: Payer: Self-pay

## 2015-05-12 ENCOUNTER — Ambulatory Visit: Payer: Medicare Other | Admitting: Cardiology

## 2015-05-12 ENCOUNTER — Other Ambulatory Visit: Payer: Self-pay | Admitting: Adult Health

## 2015-05-12 ENCOUNTER — Telehealth: Payer: Self-pay | Admitting: Adult Health

## 2015-05-12 ENCOUNTER — Ambulatory Visit (INDEPENDENT_AMBULATORY_CARE_PROVIDER_SITE_OTHER)
Admission: RE | Admit: 2015-05-12 | Discharge: 2015-05-12 | Disposition: A | Discharge status: Home / Self Care | Payer: Medicare Other | Source: Ambulatory Visit | Attending: Adult Health | Admitting: Adult Health

## 2015-05-12 ENCOUNTER — Ambulatory Visit (INDEPENDENT_AMBULATORY_CARE_PROVIDER_SITE_OTHER): Payer: Medicare Other | Admitting: Adult Health

## 2015-05-12 VITALS — BP 104/70 | HR 85 | Temp 98.1°F | Ht 75.0 in | Wt 201.0 lb

## 2015-05-12 DIAGNOSIS — R059 Cough, unspecified: Secondary | ICD-10-CM

## 2015-05-12 DIAGNOSIS — R05 Cough: Secondary | ICD-10-CM | POA: Diagnosis not present

## 2015-05-12 MED ORDER — BENZONATATE 200 MG PO CAPS: 200.0000 mg | ORAL_CAPSULE | Freq: Three times a day (TID) | ORAL | Status: DC | PRN

## 2015-05-12 NOTE — Telephone Encounter (Signed)
error 

## 2015-05-12 NOTE — Telephone Encounter (Signed)
Home advice given - Routed to [name]'s CMA for further follow up if needed.

## 2015-05-12 NOTE — Patient Instructions (Addendum)
[name],   It was great seeing you again today!  I will follow up with you when I get the results of your chest x ray back.

## 2015-05-12 NOTE — Telephone Encounter (Signed)
[facility] - Client [place] Patient Name: [name] Gender: Male DOB: 1927-12-23 Age: 82 Y 21 D Return Phone Number: [phone] (Primary), [phone] (Secondary) Address: City/State/Zip: [place] [address] Client [facility] - Client Client Site  [facility] Contact Type Call Who Is Calling Patient / Member / Family / Caregiver Call Type Triage / Clinical Caller Name chris Johnston Relationship To Patient Son Return Phone Number [phone] (Primary) Chief Complaint Fever (non urgent symptom) (> THREE MONTHS) Reason for Call Symptomatic / Request for Health Information Initial Comment Caller States father fever 101, cough, had pneumonia in November. gave him tylenol but is not reducing the fever. does not see a dr. only sees a np, verified location. when calling back you will be calling the fathers house. PreDisposition Go to ED Translation No Nurse Assessment Nurse: Amalia Hailey, RN, Melissa Date/Time (Eastern Time): 05/11/2015 3:19:14 PM Confirm and document reason for call. If symptomatic, describe symptoms. You must click the next button to save text entered. ---Caller States father fever 101, cough, had phenomena. gave him tylenol but is not reducing the fever. does not see a dr. only sees a np, verified location. when calling back you will be calling the fathers house. Has the patient traveled out of the country within the last 30 days? ---Not Applicable Does the patient have any new or worsening symptoms? ---Yes Will a triage be completed? ---Yes Related visit to physician within the last 2 weeks? ---No Does the PT have any chronic conditions? (i.e. diabetes, asthma, etc.) ---Yes List chronic conditions. ---A. Fib, CHF, Dementia, Type 2 Diabetes, Is this a behavioral health or substance abuse call? ---No Guidelines Guideline Title  Affirmed Question Affirmed Notes Nurse Date/Time Eilene Ghazi Time) Cough - Acute Non- Productive Cough (all triage questions negative) Amalia Hailey, RN, Melissa 05/11/2015 3:23:05 PM Disp. Time Eilene Ghazi Time) Disposition Final User PLEASE NOTE: All timestamps contained within this report are represented as [place] Standard Time. CONFIDENTIALTY NOTICE: This fax transmission is intended only for the addressee. It contains information that is legally privileged, confidential or otherwise protected from use or disclosure. If you are not the intended recipient, you are strictly prohibited from reviewing, disclosing, copying using or disseminating any of this information or taking any action in reliance on or regarding this information. If you have received this fax in error, please notify us immediately by telephone so that we can arrange for its return to [place]. Phone: [phone], Toll-Free: [phone], Fax: [phone] Page: 2 of 2 Call Id: [id number] 05/11/2015 3:34:08 PM Home Care Yes Amalia Hailey, RN, Lenna Sciara Caller Understands: Yes Disagree/Comply: Comply Care Advice Given Per Guideline HOME CARE: You should be able to treat this at home. REASSURANCE: * Coughing is the way that our lungs remove irritants and mucus. It helps protect our lungs from getting pneumonia. * You become worse. CALL BACK IF: * Cough lasts over 3 weeks * Difficulty breathing occurs HUMIDIFIER: If the air is dry, use a humidifier in the bedroom. (Reason: dry air makes coughs worse) * HOME REMEDY - HARD CANDY: Hard candy works just as well as medicine-flavored OTC cough drops. Diabetics should use sugar-free candy. * OTC COUGH DROPS: Cough drops can help a lot, especially for mild coughs. They reduce coughing by soothing your irritated throat and removing that tickle sensation in the back of the throat. Cough drops also have the advantage of portability - you can carry them with you. * OTC  COUGH SYRUPS: The most common cough suppressant  in OTC cough medications is dextromethorphan. Often the letters 'DM' appear in the name. COUGH MEDICINES: * HOME REMEDY - HONEY: This old home remedy has been shown to help decrease coughing at night. The adult dosage is 2 teaspoons (10 ml) at bedtime. Honey should not be given to infants under one year of age. COUGHING SPELLS: * Drink warm fluids. Inhale warm mist. (Reason: both relax the airway and loosen up the phlegm)

## 2015-05-12 NOTE — Progress Notes (Signed)
   Subjective:    Patient ID: [name], male    DOB: 23-Feb-1928, 82 y.o.   MRN: [id number]  HPI  82 year old male, who presents to the office today with his son as a historian. Per son, Yen has had a semi productive cough for one week with questionable fever and fatigue. No other complaints.   He feels like his fathers mentation and activity level has increased since having thoracentesis.   He was last treated for pneumonia three months ago.    Review of Systems  Constitutional: Positive for fever (questionable) and fatigue. Negative for chills and diaphoresis.  HENT: Negative.   Respiratory: Positive for cough. Negative for wheezing and stridor.   Cardiovascular: Negative.   Neurological: Negative.   All other systems reviewed and are negative.      Objective:   Physical Exam  Constitutional: He is oriented to person, place, and time. He appears well-developed and well-nourished. No distress.  Cardiovascular: Normal rate, regular rhythm, normal heart sounds and intact distal pulses.  Exam reveals no gallop and no friction rub.   No murmur heard. Pulmonary/Chest: Effort normal and breath sounds normal. No respiratory distress. He has no wheezes. He has no rales. He exhibits no tenderness.  Neurological: He is alert and oriented to person, place, and time.  Skin: Skin is warm and dry. No rash noted. He is not diaphoretic. No erythema. No pallor.  Psychiatric: He has a normal mood and affect. His behavior is normal. Judgment and thought content normal.  Nursing note and vitals reviewed.      Assessment & Plan:  1. Cough - DG Chest 2 View; Future- Negative for pneumonia.  - benzonatate (TESSALON) 200 MG capsule; Take 1 capsule (200 mg total) by mouth 3 (three) times daily as needed for cough.  Dispense: 20 capsule; Refill: 0

## 2015-05-13 ENCOUNTER — Other Ambulatory Visit: Payer: Medicare Other

## 2015-05-16 ENCOUNTER — Encounter: Payer: Self-pay | Admitting: Cardiology

## 2015-05-16 ENCOUNTER — Ambulatory Visit (INDEPENDENT_AMBULATORY_CARE_PROVIDER_SITE_OTHER): Payer: Medicare Other | Admitting: Cardiology

## 2015-05-16 VITALS — BP 124/70 | HR 86 | Ht 75.0 in

## 2015-05-16 DIAGNOSIS — I482 Chronic atrial fibrillation, unspecified: Secondary | ICD-10-CM

## 2015-05-16 DIAGNOSIS — I5032 Chronic diastolic (congestive) heart failure: Secondary | ICD-10-CM

## 2015-05-16 DIAGNOSIS — I340 Nonrheumatic mitral (valve) insufficiency: Secondary | ICD-10-CM | POA: Diagnosis not present

## 2015-05-16 NOTE — Patient Instructions (Signed)
Medication Instructions:  Your physician recommends that you continue on your current medications as directed. Please refer to the Current Medication list given to you today.  Labwork: NONE  Testing/Procedures: NONE  Follow-Up: AS NEEDED   If you need a refill on your cardiac medications before your next appointment, please call your pharmacy.

## 2015-05-16 NOTE — Progress Notes (Signed)
Cardiology Office Note   Date:  05/16/2015   ID:  [name], DOB 1927-08-22, MRN [id number]  PCP:  [name], NP  Cardiologist: Darlin Coco MD  Chief Complaint  Patient presents with  . scheduled follow up      History of Present Illness: [name] is a 82 y.o. male who presents for scheduled follow-up visit The patient has a history of chronic established atrial fibrillation. He is not on Coumadin because of recurrent GI bleeds.  He had a cardiac catheterization on 08/07/01 showing normal coronary arteries and an ejection fraction of 60-65%. He had an echocardiogram on 04/21/11 showing an ejection fraction of 60-65%, moderate mitral regurgitation, mild aortic insufficiency, moderate tricuspid regurgitation, and pulmonary artery pressure was 46 mmHg. Previous notes state that hey had chronic mild pretibial edema as well as chronic mild pulmonary vascular congestion with basilar rales. He had a Lexiscan Myoview on 01/23/13 showing no ischemia. The study was not gated because of his atrial fibrillation.The patient had an echocardiogram 05/30/13 showing an ejection fraction of 55-60%.  He was recently admitted to the hospitalist service on 01/10/15 for sepsis, acute respiratory failure, and suspected PNA. He presented to the ED with shortness of breath, tachycardia, tachypnea, and hypoxia. He had a CXR with suspected pericardial effusion and cardiomegaly. TTE revealed large circumferential effusion w/o tamponade.On 01/13/15, he underwent a pericardiocentesis by Dr. Ellyn Hack. He also has a left pleural effusion and underwent thoracentesis. Workup per infectious disease for pericardial effusion/pleural effusion did not reveal any infectious etiology, no malignant cells identified in the left pleural effusion. It was felt to be transudative. Repeat 2D echo showed an EF of 45-50% with small to moderate pericardial effusion. Repeat CXR was stable. Once stable for discharge, he  was sent home with home health services and home O2. He was discharged home on 2 L [place].   He was seen in clinic by Ellen Henri PA-C on 02/06/15 for post hospital follow up. He was doing better but remained on 2L 02. He felt like he was getting stronger. She arranged for a follow up limited 2D ECHO which was done on 03/11/15 and showed a trivial pericardial effusion with right/left pleural effusion.    the patient has chronic diastolic heart failure.  His most recent chest x-ray on 05/12/15 showed chronic cardiomegaly but his lungs were essentially clear.  His current furosemide regimen is 80 mg every morning and on Monday Wednesday and Friday he takes an additional 40 mg in the evening  He denies any recent increase in shortness of breath.  He is not running any fever.  He had a probable upper respiratory infection a week ago and still has a residual nonproductive cough. He was not weighed today but on inspection he has had considerable weight loss over the past year.    Past Medical History  Diagnosis Date  . Arrhythmia     a fib  . ED (erectile dysfunction)   . Memory disorder   . 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 Hematochezia     has intermittent problem  . Arthritis     DJD knee - left  . Restless leg     treats with heat  . Diabetes mellitus     NIDDM on oral medication  . Genital warts     recurrent. Not a problem  . GERD (gastroesophageal reflux disease)     treated with PPI  . Hypertension  mild  . Neuropathy (HCC)     burning discomfort feet/legs  . Irritable bladder   . Hard of hearing   . Bladder cancer (HCC)     BCG [name]    Past Surgical History  Procedure Laterality Date  . Cardiovascular stress test  03/28/2003    EF 55%.  Normal cardiolite study  . Appendectomy  1946  . Tonsillectomy  1938  . Joint replacement  1990's    left TKR  . Knee arthroscopy  1980's    right knee  . Cardiac catheterization N/A 01/13/2015     Procedure: Pericardiocentesis;  Surgeon: Leonie Man, MD;  Location: [facility];  Service: Cardiovascular;  Laterality: N/A;     Current Outpatient Prescriptions  Medication Sig Dispense Refill  . aspirin 81 MG tablet Take 81 mg by mouth daily.    . benzonatate (TESSALON) 200 MG capsule Take 1 capsule (200 mg total) by mouth 3 (three) times daily as needed for cough. 20 capsule 0  . donepezil (ARICEPT) 10 MG tablet Take 1 tablet (10 mg total) by mouth at bedtime. 90 tablet 3  . ferrous sulfate 325 (65 FE) MG tablet Take 487.5 mg by mouth 2 (two) times daily.     . folic acid (FOLVITE) 1 MG tablet Take 1 mg by mouth 2 (two) times daily.     . furosemide (LASIX) 40 MG tablet Take two tablets by mouth every morning. Take an additional tablet by mouth in the evening on Mon, Wed and Fri 75 tablet 10  . gabapentin (NEURONTIN) 100 MG capsule Take 100 mg by mouth at bedtime.    . hydrOXYzine (ATARAX/VISTARIL) 10 MG tablet Take 5 mg by mouth 3 (three) times daily as needed for itching (itching).     [name] levofloxacin (LEVAQUIN) 750 MG tablet Take 1 tablet (750 mg total) by mouth daily. 7 tablet 0  . memantine (NAMENDA) 10 MG tablet Take 10 mg by mouth 2 (two) times daily.  10  . metoprolol tartrate (LOPRESSOR) 25 MG tablet Take 25 mg by mouth 2 (two) times daily.     . Multiple Vitamin (MULTIVITAMIN WITH MINERALS) TABS tablet Take 1 tablet by mouth daily.    . Multiple Vitamins-Minerals (PRESERVISION AREDS 2 PO) Take 1 tablet by mouth 2 (two) times daily.     . nitroGLYCERIN (NITROSTAT) 0.4 MG SL tablet Place 0.4 mg under the tongue every 5 (five) minutes as needed for chest pain (x 3 doses daily).    [name] oxybutynin (DITROPAN-XL) 5 MG 24 hr tablet Take 1 tablet (5 mg total) by mouth at bedtime. 90 tablet 1  . solifenacin (VESICARE) 5 MG tablet Take 1 tablet (5 mg total) by mouth daily. 90 tablet 3  . triamcinolone cream (KENALOG) 0.1 % Apply 1 application topically daily.      No current  facility-administered medications for this visit.    Allergies:   Cephalexin; Furacin; Macrodantin; and Zestril    Social History:  The patient  reports that he quit smoking about 39 years ago. He has quit using smokeless tobacco. He reports that he drinks about 4.2 oz of alcohol per week. He reports that he does not use illicit drugs.   Family History:  The patient's family history includes Cancer in his father; Heart disease in his mother; Kidney disease in his mother; Mental illness in his brother; Stroke in his mother. There is no history of Diabetes, COPD, Heart attack, or Hypertension.  ROS:  Please see the history of present illness.   Otherwise, review of systems are positive for none.   All other systems are reviewed and negative.    PHYSICAL EXAM: VS:  BP 124/70 mmHg  Pulse 86  Ht 6\' 3"  (1.905 m)  Wt   SpO2 88% , BMI There is no weight on file to calculate BMI. GEN: Well nourished, well developed, in no acute distress HEENT: normal Neck: no JVD, carotid bruits, or masses Cardiac: Irregularly irregular,; soft apical murmur of mitral regurgitation.  No, rubs, or gallops, no peripheral edema. Respiratory:  clear to auscultation bilaterally, normal work of breathing GI: soft, nontender, nondistended, + BS MS: no deformity or atrophy Skin: warm and dry, no rash Neuro:  Strength and sensation are intact Psych: euthymic mood, full affect.  Has evidence of decreased memory   EKG:  EKG is not ordered today.   Recent Labs: 01/10/2015: B Natriuretic Peptide 91.6; Magnesium 2.1 01/15/2015: TSH 2.449 03/20/2015: ALT 10; BUN 10; Creat 0.74; Hemoglobin 11.3*; Platelets 192; Potassium 3.7; Sodium 138    Lipid Panel    Component Value Date/Time   CHOL 125 03/20/2015 1259   TRIG 66 03/20/2015 1259   HDL 48 03/20/2015 1259   CHOLHDL 2.6 03/20/2015 1259   VLDL 13 03/20/2015 1259   LDLCALC 64 03/20/2015 1259      Wt Readings from Last 3 Encounters:  05/12/15 201 lb  (91.173 kg)  05/01/15 201 lb (91.173 kg)  03/27/15 213 lb (96.616 kg)         ASSESSMENT AND PLAN:    Pericardial Effusion: s/p pericardiocentesis. No symptoms of worsening dyspnea, chest pain, syncope/ near syncope. VSS. Repeat limited ECHO showed trivial pericardial effusion.  No further workup planned  Chronic Atrial Fibrillation: rate is well controlled. HR 86. CHADSVASC score of at least 4 (CHF, HTN, Age). Not on anticoagulation candidate due to h/o GIBleeds.  Pleural effusions/chronic diastolic CHF: left pleural effusion on CXR. Felt to be transudative and 2/2 CHF per Dr. Lamonte Sakai. Repeat CXR on 03/13/15 with slight improvement. He has been on lasix 80mg  po AM, 40 pm (M, W, Fr) Recent labs with stable creat/K. He is doing quite well. I think we can continue this Lasix dosing regimen.   Dementia: he has good and bad days. On Memantine and Aricept   Current medicines are reviewed at length with the patient today.  The patient does not have concerns regarding medicines.  The following changes have been made:  no change  Labs/ tests ordered today include:  No orders of the defined types were placed in this encounter.     Disposition:   Continue current medication.  He will continue medical follow-up with [name] NP.  Return to cardiology on a when necessary basis following my retirement.  Berna Spare MD 05/16/2015 4:37 PM    [facility] [place], [place], [place]  [address] Phone: [phone]; Fax: [phone]

## 2015-05-18 ENCOUNTER — Telehealth: Payer: Self-pay | Admitting: Adult Health

## 2015-05-19 ENCOUNTER — Encounter (HOSPITAL_COMMUNITY): Payer: Self-pay | Admitting: *Deleted

## 2015-05-19 ENCOUNTER — Telehealth: Payer: Self-pay | Admitting: Neurology

## 2015-05-19 ENCOUNTER — Emergency Department (HOSPITAL_COMMUNITY)
Admission: EM | Admit: 2015-05-19 | Discharge: 2015-05-19 | Disposition: A | Discharge status: Home / Self Care | Payer: Medicare Other | Attending: Emergency Medicine | Admitting: Emergency Medicine

## 2015-05-19 ENCOUNTER — Telehealth: Payer: Self-pay | Admitting: Adult Health

## 2015-05-19 DIAGNOSIS — H9190 Unspecified hearing loss, unspecified ear: Secondary | ICD-10-CM | POA: Insufficient documentation

## 2015-05-19 DIAGNOSIS — K625 Hemorrhage of anus and rectum: Secondary | ICD-10-CM | POA: Insufficient documentation

## 2015-05-19 DIAGNOSIS — I10 Essential (primary) hypertension: Secondary | ICD-10-CM | POA: Diagnosis not present

## 2015-05-19 DIAGNOSIS — E119 Type 2 diabetes mellitus without complications: Secondary | ICD-10-CM | POA: Insufficient documentation

## 2015-05-19 LAB — COMPREHENSIVE METABOLIC PANEL
ALBUMIN: 3.4 g/dL — AB (ref 3.5–5.0)
ALK PHOS: 95 U/L (ref 38–126)
ALT: 14 U/L — AB (ref 17–63)
AST: 23 U/L (ref 15–41)
Anion gap: 12 (ref 5–15)
BUN: 14 mg/dL (ref 6–20)
CALCIUM: 9.8 mg/dL (ref 8.9–10.3)
CO2: 32 mmol/L (ref 22–32)
CREATININE: 0.91 mg/dL (ref 0.61–1.24)
Chloride: 99 mmol/L — ABNORMAL LOW (ref 101–111)
GFR calc Af Amer: >60 mL/min (ref >60)
GFR calc non Af Amer: >60 mL/min (ref >60)
GLUCOSE: 119 mg/dL — AB (ref 65–99)
Potassium: 4.7 mmol/L (ref 3.5–5.1)
SODIUM: 143 mmol/L (ref 135–145)
Total Bilirubin: 0.5 mg/dL (ref 0.3–1.2)
Total Protein: 6.8 g/dL (ref 6.5–8.1)

## 2015-05-19 LAB — CBC
HCT: 43.6 % (ref 39.0–52.0)
HEMOGLOBIN: 13.7 g/dL (ref 13.0–17.0)
MCH: 26.3 pg (ref 26.0–34.0)
MCHC: 31.4 g/dL (ref 30.0–36.0)
MCV: 83.8 fL (ref 78.0–100.0)
PLATELETS: 142 10*3/uL — AB (ref 150–400)
RBC: 5.2 MIL/uL (ref 4.22–5.81)
RDW: 15.4 % (ref 11.5–15.5)
WBC: 4.6 10*3/uL (ref 4.0–10.5)

## 2015-05-19 NOTE — Telephone Encounter (Signed)
Daughter called to advise pt is coughing up yellow phlem, no fever, caregiver reported dark urine yesterday, and tried to get him to drink more. Daughter is not sure what is looks like today. Wife called her to ask if pt needs to be seen again.

## 2015-05-19 NOTE — Telephone Encounter (Signed)
[name] is taking over his medication management and wanted to review his Namenda dose.  Patient had previously taken Namenda XR 28mg , once daily but was transitioned back to 10mg  immediate release twice daily due to cost.  [name] verbalized understanding.

## 2015-05-19 NOTE — Telephone Encounter (Signed)
Son states pt had an acute visit last week. Pt is feeling better, but when pt gets up to go to bathroom or do anything, he is short of breath. They put wife's O2 on him and it helps. Son would like to know if he can get pt his own O2. Pt had it when he came home from hospital,  but they sent it back. Please advise.

## 2015-05-19 NOTE — ED Notes (Signed)
Pt's caregiver is worried because they some some toilet paper in the toilet that had a little of blood on it.  They weren't sure if it was from his bottom or his nose, but there was no other blood in the toilet.  Pt has no other complaints.

## 2015-05-19 NOTE — Telephone Encounter (Signed)
He should go to the ER

## 2015-05-19 NOTE — Telephone Encounter (Signed)
[facility] - Client [place] [facility] Patient Name: [name] Gender: Male DOB: Apr 27, 1927 Age: 82 Y 28 D Return Phone Number: [phone] (Primary), [phone] (Secondary) Address: City/State/Zip: [place] [address] Client [facility] - Client Client Site [facility] Contact Type Call Who Is Calling Patient / Member / Family / Caregiver Call Type Triage / Clinical Caller Name Twana First Relationship To Patient Daughter Return Phone Number [phone] (Primary) Chief Complaint BREATHING - shortness of breath or sounds breathless Reason for Call Symptomatic / Request for [place] states he is on oxygen and his oxy levels are 90. Coughing up yellow phlegm. PreDisposition Did not know what to do Translation No Nurse Assessment Nurse: Martyn Ehrich, RN, Felicia Date/Time (Eastern Time): 05/18/2015 2:11:47 PM Confirm and document reason for call. If symptomatic, describe symptoms. You must click the next button to save text entered. ---Pt is has CHF and on home oxygen. Oxygen sat is 90. Not feeling well. Coughing up yellow phelgm. Cough onset last weekend that got better and came back this am. No fever Has the patient traveled out of the country within the last 30 days? ---No Does the patient have any new or worsening symptoms? ---Yes Will a triage be completed? ---Yes Related visit to physician within the last 2 weeks? ---Yes Does the PT have any chronic conditions? (i.e. diabetes, asthma, etc.) ---Yes List chronic conditions. ---CHF Is this a behavioral health or substance abuse call? ---No Guidelines Guideline Title Affirmed Question Affirmed Notes Nurse Date/Time (Eastern Time) Cough - Acute Productive Patient sounds very sick or weak to the triager Martyn Ehrich, RN, [facility] [id number] PM Disp. Time Eilene Ghazi Time)  Disposition Final User 05/18/2015 2:09:29 PM Send to Urgent Queue [employer] 05/18/2015 2:20:35 PM Go to ED Now (or PCP triage) Yes Martyn Ehrich, RN, Felicia PLEASE NOTE: All timestamps contained within this report are represented as [place] Standard Time. CONFIDENTIALTY NOTICE: This fax transmission is intended only for the addressee. It contains information that is legally privileged, confidential or otherwise protected from use or disclosure. If you are not the intended recipient, you are strictly prohibited from reviewing, disclosing, copying using or disseminating any of this information or taking any action in reliance on or regarding this information. If you have received this fax in error, please notify us immediately by telephone so that we can arrange for its return to [place]. Phone: [phone], Toll-Free: [phone], Fax: [phone] Page: 2 of 2 Call Id: [id number] [place] Understands: Yes Disagree/Comply: Comply Care Advice Given Per Guideline GO TO ED NOW (OR PCP TRIAGE): * Severe difficulty breathing occurs * Lips or face turns blue * Passes out or becomes confused. Comments User: Daphene Calamity, RN Date/Time Eilene Ghazi Time): 05/18/2015 2:14:02 PM no swelling in legs User: Daphene Calamity, RN Date/Time Eilene Ghazi Time): 05/18/2015 2:22:08 PM they are saying though he can walk around he is much weaker than last week User: Daphene Calamity, RN Date/Time Eilene Ghazi Time): 05/18/2015 2:24:59 PM Now oxygen Sat is 93%. - they are undecided on whether they will go in today or wait for MD tom - but understand ER recommendation User: Daphene Calamity, RN Date/Time Eilene Ghazi Time): 05/18/2015 2:27:39 PM Sallee Provencal, NP is his provider Referrals [place] UNDECIDED

## 2015-05-19 NOTE — Telephone Encounter (Signed)
He will need to be seen if he wants to go back on oxygen.

## 2015-05-19 NOTE — Telephone Encounter (Signed)
Khadejha the caregiver answered the phone and is aware that pt should be seen at the ED ASAP.

## 2015-05-19 NOTE — Telephone Encounter (Signed)
[name] [id number] called regarding memantine (NAMENDA) 10 MG tablet, states before they were giving 28mg  twice a day, now Rx is for 10mg  twice a day, please call to clarify.

## 2015-05-20 ENCOUNTER — Encounter: Payer: Self-pay | Admitting: Adult Health

## 2015-05-20 NOTE — Telephone Encounter (Signed)
See pt e-mail; advised per [facility] appt needed.

## 2015-05-23 ENCOUNTER — Encounter: Payer: Self-pay | Admitting: Adult Health

## 2015-05-23 ENCOUNTER — Ambulatory Visit (INDEPENDENT_AMBULATORY_CARE_PROVIDER_SITE_OTHER): Payer: Medicare Other | Admitting: Adult Health

## 2015-05-23 VITALS — BP 108/60 | Temp 97.9°F

## 2015-05-23 DIAGNOSIS — R0602 Shortness of breath: Secondary | ICD-10-CM

## 2015-05-23 DIAGNOSIS — D696 Thrombocytopenia, unspecified: Secondary | ICD-10-CM | POA: Diagnosis not present

## 2015-05-23 DIAGNOSIS — R062 Wheezing: Secondary | ICD-10-CM

## 2015-05-23 LAB — CBC WITH DIFFERENTIAL/PLATELET
BASOS ABS: 0 10*3/uL (ref 0.0–0.1)
Basophils Relative: 0 % (ref 0–1)
EOS PCT: 3 % (ref 0–5)
Eosinophils Absolute: 0.2 10*3/uL (ref 0.0–0.7)
HEMATOCRIT: 42.1 % (ref 39.0–52.0)
Hemoglobin: 13.9 g/dL (ref 13.0–17.0)
LYMPHS ABS: 0.8 10*3/uL (ref 0.7–4.0)
LYMPHS PCT: 15 % (ref 12–46)
MCH: 27.6 pg (ref 26.0–34.0)
MCHC: 33 g/dL (ref 30.0–36.0)
MCV: 83.7 fL (ref 78.0–100.0)
MONOS PCT: 13 % — AB (ref 3–12)
MPV: 9.5 fL (ref 8.6–12.4)
Monocytes Absolute: 0.7 10*3/uL (ref 0.1–1.0)
Neutro Abs: 3.8 10*3/uL (ref 1.7–7.7)
Neutrophils Relative %: 69 % (ref 43–77)
Platelets: 191 10*3/uL (ref 150–400)
RBC: 5.03 MIL/uL (ref 4.22–5.81)
RDW: 15.3 % (ref 11.5–15.5)
WBC: 5.5 10*3/uL (ref 4.0–10.5)

## 2015-05-23 MED ORDER — PREDNISONE 20 MG PO TABS: 20.0000 mg | ORAL_TABLET | Freq: Every day | ORAL | Status: DC

## 2015-05-23 MED ORDER — DOXYCYCLINE HYCLATE 100 MG PO CAPS: 100.0000 mg | ORAL_CAPSULE | Freq: Two times a day (BID) | ORAL | Status: DC

## 2015-05-23 NOTE — Progress Notes (Addendum)
Subjective:    Patient ID: [name], male    DOB: 26-Nov-1927, 82 y.o.   MRN: [id number]  HPI  82 year old male who presents to the office today for follow up after being seen in the ER on 05/19/2015 for concern over possible bright red blood on toilet paper. Once in the ER labs were drawn, which showed a slightly low platelet count. [name] did not want to stay in the ER so family took him home before he could be evaluated by an ER physician.   [name] does not have any complaints today. His [name] who is with him during this visit is concerned about his low platelet count. He also reports that [name] is having to use his wife's home oxygen when he is feeling short of breath.   Review of Systems  Constitutional: Negative.   HENT: Negative.   Respiratory: Negative.   Cardiovascular: Negative.   Gastrointestinal: Negative.   Genitourinary: Negative.   Neurological: Negative.   All other systems reviewed and are negative.  Past Medical History  Diagnosis Date  . Arrhythmia     a fib  . ED (erectile dysfunction)   . Memory disorder   . 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 Hematochezia     has intermittent problem  . Arthritis     DJD knee - left  . Restless leg     treats with heat  . Diabetes mellitus     NIDDM on oral medication  . Genital warts     recurrent. Not a problem  . GERD (gastroesophageal reflux disease)     treated with PPI  . Hypertension     mild  . Neuropathy (HCC)     burning discomfort feet/legs  . Irritable bladder   . Hard of hearing   . Bladder cancer ([place])     BCG [name]    Social History   Social History  . Marital Status: Married    Spouse Name: [name]  . Number of Children: 4  . Years of Education: 10th    Occupational History  . retired    Social History Main Topics  . Smoking status: Former Smoker    Quit date: 08/28/1975  . Smokeless tobacco: Former Systems developer  . Alcohol Use: 4.2 oz/week    7 Shots of  liquor per week  . Drug Use: No  . Sexual Activity: Not Currently   Other Topics Concern  . Not on file   Social History Narrative   Patient lives at home with his wife [name]) Married 30 years.   Retired   Education 10 th grade   Right handed   Caffeine one or two daily     Past Surgical History  Procedure Laterality Date  . Cardiovascular stress test  03/28/2003    EF 55%.  Normal cardiolite study  . Appendectomy  1946  . Tonsillectomy  1938  . Joint replacement  1990's    left TKR  . Knee arthroscopy  1980's    right knee  . Cardiac catheterization N/A 01/13/2015    Procedure: Pericardiocentesis;  Surgeon: Leonie Man, MD;  Location: [facility];  Service: Cardiovascular;  Laterality: N/A;    Family History  Problem Relation Age of Onset  . Kidney disease Mother   . Heart disease Mother   . Stroke Mother   . Cancer Father     prostate  . Diabetes  Neg Hx   . COPD Neg Hx   . Mental illness Brother   . Heart attack Neg Hx   . Hypertension Neg Hx     Allergies  Allergen Reactions  . Cephalexin Other (See Comments)    Unknown reaction  . Furacin [Nitrofurazone] Other (See Comments)    Unknown reaction  . Macrodantin Other (See Comments)    Unknown reaction  . Zestril [Lisinopril] Other (See Comments)    Unknown reaction    Current Outpatient Prescriptions on File Prior to Visit  Medication Sig Dispense Refill  . aspirin 81 MG tablet Take 81 mg by mouth daily.    . benzonatate (TESSALON) 200 MG capsule Take 1 capsule (200 mg total) by mouth 3 (three) times daily as needed for cough. 20 capsule 0  . donepezil (ARICEPT) 10 MG tablet Take 1 tablet (10 mg total) by mouth at bedtime. 90 tablet 3  . ferrous sulfate 325 (65 FE) MG tablet Take 487.5 mg by mouth 2 (two) times daily.     . folic acid (FOLVITE) 1 MG tablet Take 1 mg by mouth 2 (two) times daily.     . furosemide (LASIX) 40 MG tablet Take two tablets by mouth every morning. Take an additional  tablet by mouth in the evening on Mon, Wed and Fri 75 tablet 10  . gabapentin (NEURONTIN) 100 MG capsule Take 100 mg by mouth at bedtime.    . hydrOXYzine (ATARAX/VISTARIL) 10 MG tablet Take 5 mg by mouth 3 (three) times daily as needed for itching (itching).     [name] levofloxacin (LEVAQUIN) 750 MG tablet Take 1 tablet (750 mg total) by mouth daily. 7 tablet 0  . memantine (NAMENDA) 10 MG tablet Take 10 mg by mouth 2 (two) times daily.  10  . metoprolol tartrate (LOPRESSOR) 25 MG tablet Take 25 mg by mouth 2 (two) times daily.     . Multiple Vitamin (MULTIVITAMIN WITH MINERALS) TABS tablet Take 1 tablet by mouth daily.    . Multiple Vitamins-Minerals (PRESERVISION AREDS 2 PO) Take 1 tablet by mouth 2 (two) times daily.     . nitroGLYCERIN (NITROSTAT) 0.4 MG SL tablet Place 0.4 mg under the tongue every 5 (five) minutes as needed for chest pain (x 3 doses daily).    [name] oxybutynin (DITROPAN-XL) 5 MG 24 hr tablet Take 1 tablet (5 mg total) by mouth at bedtime. 90 tablet 1  . solifenacin (VESICARE) 5 MG tablet Take 1 tablet (5 mg total) by mouth daily. 90 tablet 3  . triamcinolone cream (KENALOG) 0.1 % Apply 1 application topically daily.      No current facility-administered medications on file prior to visit.    BP 108/60 mmHg  Temp(Src) 97.9 F (36.6 C) (Oral)  SpO2 91%       Objective:   Physical Exam  Constitutional: He is oriented to person, place, and time. He appears well-developed and well-nourished. No distress.  Cardiovascular: Normal rate, regular rhythm, normal heart sounds and intact distal pulses.  Exam reveals no gallop and no friction rub.   No murmur heard. Pulmonary/Chest: Effort normal. No respiratory distress. He has wheezes in the right upper field, the right middle field, the right lower field, the left upper field and the left middle field. He has no rhonchi. He has no rales. He exhibits no tenderness.  Genitourinary:  Refused rectal exam   Neurological: He is alert  and oriented to person, place, and time.  Skin: Skin is warm and  dry. No rash noted. He is not diaphoretic. No erythema. No pallor.  Psychiatric: He has a normal mood and affect. His behavior is normal. Judgment and thought content normal.  Nursing note and vitals reviewed.     Assessment & Plan:  1. Platelets decreased ([place]) - CBC with Differential/Platelet  2. Wheezing - predniSONE (DELTASONE) 20 MG tablet; Take 1 tablet (20 mg total) by mouth daily with breakfast. 40 mg x 5 days  Dispense: 10 tablet; Refill: 0 - doxycycline (VIBRAMYCIN) 100 MG capsule; Take 1 capsule (100 mg total) by mouth 2 (two) times daily.  Dispense: 20 capsule; Refill: 0 - Follow up if continuing to wheeze or feel short of breath.  - Will order home oxygen   3. Shortness of breath 88% on room air while resting 86% on room air with exertion 93% on 2 L via [place] with exertion

## 2015-05-23 NOTE — Patient Instructions (Signed)
It was great seeing you again!  I have sent in a prescription for Prednisone, take 40 mg x 5 days and for doxycycline (take twice a day for 10 days)  I will follow up with you about your blood work

## 2015-05-27 NOTE — Addendum Note (Signed)
Addended by: Apolinar Junes on: 05/27/2015 09:19 AM   Modules accepted: Orders

## 2015-06-02 ENCOUNTER — Encounter: Payer: Self-pay | Admitting: Nurse Practitioner

## 2015-06-02 ENCOUNTER — Ambulatory Visit (INDEPENDENT_AMBULATORY_CARE_PROVIDER_SITE_OTHER): Payer: Medicare Other | Admitting: Nurse Practitioner

## 2015-06-02 ENCOUNTER — Telehealth: Payer: Self-pay | Admitting: Adult Health

## 2015-06-02 VITALS — BP 130/74 | HR 67 | Ht 75.0 in | Wt 194.6 lb

## 2015-06-02 DIAGNOSIS — R269 Unspecified abnormalities of gait and mobility: Secondary | ICD-10-CM

## 2015-06-02 DIAGNOSIS — F0390 Unspecified dementia without behavioral disturbance: Secondary | ICD-10-CM

## 2015-06-02 MED ORDER — MEMANTINE HCL 10 MG PO TABS: 10.0000 mg | ORAL_TABLET | Freq: Two times a day (BID) | ORAL | Status: DC

## 2015-06-02 NOTE — Telephone Encounter (Signed)
Pt was seen on 05-23-15 and son would like to know if his dad needs oxygen

## 2015-06-02 NOTE — Progress Notes (Signed)
[facility]  PATIENT: [name] DOB: Dec 17, 1927   REASON FOR VISIT: Follow-up for dementia abnormality of gait HISTORY FROM: Patient and son    HISTORY OF PRESENT ILLNESS: HISTORY: [name] is a 82 yo RH referred by cardiologist [name], Primary care is Dr. Timoteo Gaul, for evaluation of memory trouble, gait difficulty, he is accompanied by his wife, and daughter at today's clinical visit. He had a past medical history of chronic atrial fibrillation, was on Coumadin treatment, but developed recurrent GI bleeding, from reviewing the chart, he is not on any anticoagulation treatment, he also had past medical history of obesity, sedentary lifestyle, diabetes, Family noticed he had gradual onset memory trouble over the past few years, has been taking Aricept 10 mg every day, he also has gradual onset gait difficulty, began to use cane since 2012, he has no significant low back pain or neck pain, recent 12 months, since 2014, he has worsening gait difficulty, urinary incontinence, urgency, worsening memory trouble, he tends to repeat himself, forget familiar people's name, forgot to turn off stove, he has quit driving for a few months now, He has bilateral lower extremity edema, recent echocardiogram showed ejection fraction 60-65%, moderate mitral regurgitation, mild aortic stenosis  He went to school for 10 years, was a Armed forces operational officer, was able to retired at age 62, traveled all over the world, but has become sedentary over the past 10 years, he still enjoys playing his computer games   UPDATE May 7th 2015:I have reviewed MRI with patient and his family, MRI of brain showed moderate atrophy, mild to moderate periventricular small vessel disease. MRI of lumbar showed L4-5: pseudo-disc bulging with facet hypertrophy with moderate biforaminal foraminal stenosis, L5-S1: severe facet arthropathy with no spinal stenosis or foraminal narrowing. At L1-2, L2-3, L3-4: disc  bulging and facet hypertrophy with mild biforaminal foraminal stenosis  Laboratory showed a mild anemia, ferritin was 15, otherwise normal B12, CMP, Family reported multiple episodes of anemia, in the past, no etiology was found, he had physical therapy for 3 weeks, no significant change, he denies low back pain, does has urinary urgency, occasionally incontinence episodes. He has been taking gabapentin 300 mg every night which has helped his sleep, UPDATE Dec 03 2014: He is with his wife and [name] at today's visit.He has worsening memory trouble,   UPDATE 06/02/15 CM [name], 82 year old male returns for follow-up with his son. He has a history of memory disorder and gait abnormality. He was last seen in this office 12/03/2014. Since that time he has had an admission for pneumonia and also congestive heart failure. He was getting some therapies in the home however that has now concluded. He has not performed home exercise program. He has some urinary incontinence related to his Lasix. Son reports that he has not had any falls and he uses walker. Appetite is reportedly good and he is sleeping well. Son reports memory is better. He returns for reevaluation  REVIEW OF SYSTEMS: Full 14 system review of systems performed and notable only for those listed, all others are neg:  Constitutional: neg  Cardiovascular: neg Ear/Nose/Throat: neg  Skin: neg Eyes: neg Respiratory: neg Gastroitestinal: neg  Hematology/Lymphatic: neg  Endocrine: neg Musculoskeletal: Walking difficulty Allergy/Immunology: neg Neurological: Memory loss Psychiatric: neg Sleep : neg   ALLERGIES: Allergies  Allergen Reactions  . Cephalexin Other (See Comments)    Unknown reaction  . Furacin [Nitrofurazone] Other (See Comments)    Unknown reaction  .  Macrodantin Other (See Comments)    Unknown reaction  . Zestril [Lisinopril] Other (See Comments)    Unknown reaction    HOME MEDICATIONS: Outpatient Prescriptions  Prior to Visit  Medication Sig Dispense Refill  . aspirin 81 MG tablet Take 81 mg by mouth daily.    [name] donepezil (ARICEPT) 10 MG tablet Take 1 tablet (10 mg total) by mouth at bedtime. 90 tablet 3  . ferrous sulfate 325 (65 FE) MG tablet Take 487.5 mg by mouth 2 (two) times daily.     . folic acid (FOLVITE) 1 MG tablet Take 1 mg by mouth 2 (two) times daily.     . furosemide (LASIX) 40 MG tablet Take two tablets by mouth every morning. Take an additional tablet by mouth in the evening on Mon, Wed and Fri 75 tablet 10  . gabapentin (NEURONTIN) 100 MG capsule Take 100 mg by mouth at bedtime.    . hydrOXYzine (ATARAX/VISTARIL) 10 MG tablet Take 5 mg by mouth 3 (three) times daily as needed for itching (itching).     . memantine (NAMENDA) 10 MG tablet Take 10 mg by mouth 2 (two) times daily.  10  . metoprolol tartrate (LOPRESSOR) 25 MG tablet Take 25 mg by mouth 2 (two) times daily.     . Multiple Vitamin (MULTIVITAMIN WITH MINERALS) TABS tablet Take 1 tablet by mouth daily.    . Multiple Vitamins-Minerals (PRESERVISION AREDS 2 PO) Take 1 tablet by mouth 2 (two) times daily.     . nitroGLYCERIN (NITROSTAT) 0.4 MG SL tablet Place 0.4 mg under the tongue every 5 (five) minutes as needed for chest pain (x 3 doses daily).    [name] oxybutynin (DITROPAN-XL) 5 MG 24 hr tablet Take 1 tablet (5 mg total) by mouth at bedtime. 90 tablet 1  . solifenacin (VESICARE) 5 MG tablet Take 1 tablet (5 mg total) by mouth daily. 90 tablet 3  . triamcinolone cream (KENALOG) 0.1 % Apply 1 application topically daily.     . benzonatate (TESSALON) 200 MG capsule Take 1 capsule (200 mg total) by mouth 3 (three) times daily as needed for cough. (Patient not taking: Reported on 06/02/2015) 20 capsule 0  . doxycycline (VIBRAMYCIN) 100 MG capsule Take 1 capsule (100 mg total) by mouth 2 (two) times daily. (Patient not taking: Reported on 06/02/2015) 20 capsule 0  . levofloxacin (LEVAQUIN) 750 MG tablet Take 1 tablet (750 mg total) by  mouth daily. (Patient not taking: Reported on 06/02/2015) 7 tablet 0  . predniSONE (DELTASONE) 20 MG tablet Take 1 tablet (20 mg total) by mouth daily with breakfast. 40 mg x 5 days (Patient not taking: Reported on 06/02/2015) 10 tablet 0   No facility-administered medications prior to visit.    PAST MEDICAL HISTORY: Past Medical History  Diagnosis Date  . Arrhythmia     a fib  . ED (erectile dysfunction)   . Memory disorder   . 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 Hematochezia     has intermittent problem  . Arthritis     DJD knee - left  . Restless leg     treats with heat  . Diabetes mellitus     NIDDM on oral medication  . Genital warts     recurrent. Not a problem  . GERD (gastroesophageal reflux disease)     treated with PPI  . Hypertension     mild  . Neuropathy (HCC)     burning discomfort  feet/legs  . Irritable bladder   . Hard of hearing   . Bladder cancer ([facility])     BCG [name]    PAST SURGICAL HISTORY: Past Surgical History  Procedure Laterality Date  . Cardiovascular stress test  03/28/2003    EF 55%.  Normal cardiolite study  . Appendectomy  1946  . Tonsillectomy  1938  . Joint replacement  1990's    left TKR  . Knee arthroscopy  1980's    right knee  . Cardiac catheterization N/A 01/13/2015    Procedure: Pericardiocentesis;  Surgeon: Leonie Man, MD;  Location: [facility];  Service: Cardiovascular;  Laterality: N/A;    FAMILY HISTORY: Family History  Problem Relation Age of Onset  . Kidney disease Mother   . Heart disease Mother   . Stroke Mother   . Cancer Father     prostate  . Diabetes Neg Hx   . COPD Neg Hx   . Mental illness Brother   . Heart attack Neg Hx   . Hypertension Neg Hx     SOCIAL HISTORY: Social History   Social History  . Marital Status: Married    Spouse Name: [name]  . Number of Children: 4  . Years of Education: 10th    Occupational History  . retired    Social History Main  Topics  . Smoking status: Former Smoker    Quit date: 08/28/1975  . Smokeless tobacco: Former Systems developer  . Alcohol Use: No  . Drug Use: No  . Sexual Activity: Not Currently   Other Topics Concern  . Not on file   Social History Narrative   Patient lives at home with his wife [name]) Married 34 years.   Retired   Education 10 th grade   Right handed   Caffeine one or two daily      PHYSICAL EXAM  Filed Vitals:   06/02/15 1446  BP: 130/74  Pulse: 67  Height: 6\' 3"  (1.905 m)  Weight: 194 lb 9.6 oz (88.27 kg)   Body mass index is 24.32 kg/(m^2). Gen: NAD, conversant, well nourised, obese, well groomed  Cardiovascular: Regular rate rhythm, no peripheral edema, Neck: Supple, no carotid bruit. Pulmonary: Clear to auscultation bilaterally   NEUROLOGICAL EXAM:  MENTAL STATUS: Speech:  Speech is normal; fluent and spontaneous with normal comprehension.  Cognition: Mini-Mental Status Examination is 13 out of 30 . He misses items in orientation calculation to 3 recallHe has difficulty copy design, write a sentence CRANIAL NERVES: CN II: Visual fields are full to confrontation. Pupils are round equal and briskly reactive to light. CN III, IV, VI: extraocular movement are normal. No ptosis. CN V: Facial sensation is intact to pinprick in all 3 divisions bilaterally.   CN VII: Face is symmetric with normal eye closure and smile. CN VIII: Hard of hearing bilaterally  CN IX, X: Palate elevates symmetrically. Phonation is normal. CN XI: Head turning and shoulder shrug are intact CN XII: Tongue is midline with normal movements and no atrophy. MOTOR:There is no pronator drift of out-stretched arms. Muscle bulk and tone are normal. He has mild to moderate bilateral ankle dorsiflexion weakness REFLEXES:Hyperreflexia of bilateral lower extremities. Plantar responses are flexor. SENSORY: Length dependent sensory changes COORDINATION:Rapid alternating movements and  fine finger movements are intact. There is no dysmetria on finger-to-nose and heel-knee-shin.  GAIT/STANCE:He needs help to get up from seated position,with help  cautious, steady gait with   his walker   DIAGNOSTIC DATA (  LABS, IMAGING, TESTING) - I reviewed patient records, labs, notes, testing and imaging myself where available.  Lab Results  Component Value Date   WBC 5.5 05/23/2015   HGB 13.9 05/23/2015   HCT 42.1 05/23/2015   MCV 83.7 05/23/2015   PLT 191 05/23/2015      Component Value Date/Time   NA 143 05/19/2015 1704   K 4.7 05/19/2015 1704   CL 99* 05/19/2015 1704   CO2 32 05/19/2015 1704   GLUCOSE 119* 05/19/2015 1704   BUN 14 05/19/2015 1704   CREATININE 0.91 05/19/2015 1704   CREATININE 0.74 03/20/2015 1259   CALCIUM 9.8 05/19/2015 1704   PROT 6.8 05/19/2015 1704   ALBUMIN 3.4* 05/19/2015 1704   AST 23 05/19/2015 1704   ALT 14* 05/19/2015 1704   ALKPHOS 95 05/19/2015 1704   BILITOT 0.5 05/19/2015 1704   GFRNONAA >60 05/19/2015 1704   GFRAA >60 05/19/2015 1704   Lab Results  Component Value Date   CHOL 125 03/20/2015   HDL 48 03/20/2015   LDLCALC 64 03/20/2015   TRIG 66 03/20/2015   CHOLHDL 2.6 03/20/2015     Lab Results  Component Value Date   TSH 2.449 01/15/2015      ASSESSMENT AND PLAN  82 y.o. year old male  has a past medical history Dementia, Mini-Mental Status exam today 13 out of 30. Gait abnormality multifactorial due to lumbar stenosis peripheral neuropathy and deconditioning. Recent hospitalization for pneumonia and congestive heart failure.  Will continue  Aricept does not need refills Continue  Namenda will refill  Continue to use walker at all times to prevent falls Perforn HEP  As directed by therapies F/U in 6 months [name], [facility], [facility], APRN  [facility] [address] [place], [place] [address] [phone]

## 2015-06-02 NOTE — Patient Instructions (Signed)
Will refill Aricept and Namenda for memory Continue to use walker at all times to prevent falls Perforn HEP  As directed by therapies F/U in 6 months

## 2015-06-02 NOTE — Progress Notes (Signed)
I have reviewed and agreed above plan. 

## 2015-06-03 NOTE — Telephone Encounter (Signed)
Called and spoke with pt's son and he is aware that orders have been sent to [facility].

## 2015-06-03 NOTE — Addendum Note (Signed)
Addended by: Apolinar Junes on: 06/03/2015 08:32 AM   Modules accepted: Orders

## 2015-06-23 ENCOUNTER — Other Ambulatory Visit: Payer: Self-pay

## 2015-06-23 ENCOUNTER — Telehealth: Payer: Self-pay | Admitting: Adult Health

## 2015-06-23 MED ORDER — DONEPEZIL HCL 10 MG PO TABS: 10.0000 mg | ORAL_TABLET | Freq: Every day | ORAL | Status: DC

## 2015-06-23 NOTE — Telephone Encounter (Signed)
Ok to refill 

## 2015-06-23 NOTE — Telephone Encounter (Signed)
Rx refilled as directed. 

## 2015-06-23 NOTE — Telephone Encounter (Signed)
Pt request refill of the following: donepezil (ARICEPT) 10 MG tablet   Phamacy:   [facility]

## 2015-06-23 NOTE — Telephone Encounter (Signed)
Ok to refill for 6 months 

## 2015-06-24 ENCOUNTER — Encounter: Payer: Self-pay | Admitting: Adult Health

## 2015-06-24 ENCOUNTER — Ambulatory Visit (INDEPENDENT_AMBULATORY_CARE_PROVIDER_SITE_OTHER): Payer: Medicare Other | Admitting: Adult Health

## 2015-06-24 VITALS — BP 126/64 | Wt 193.7 lb

## 2015-06-24 DIAGNOSIS — R05 Cough: Secondary | ICD-10-CM

## 2015-06-24 DIAGNOSIS — R059 Cough, unspecified: Secondary | ICD-10-CM

## 2015-06-24 NOTE — Progress Notes (Signed)
Pre visit review using our clinic review tool, if applicable. No additional management support is needed unless otherwise documented below in the visit note. 

## 2015-06-24 NOTE — Patient Instructions (Signed)
It was great seeing you again.   Your lungs are clear. You likely have some post nasal drip that is causing your cough.   Use Mucinex and Claritin to help with your symptoms.   Please let me know if you need anything.

## 2015-06-24 NOTE — Progress Notes (Addendum)
Subjective:    Patient ID: [name], male    DOB: 12/30/1927, 82 y.o.   MRN: [id number]  Cough This is a new problem. The current episode started today. The problem has been unchanged. The problem occurs every few minutes. The cough is productive of sputum. Associated symptoms include postnasal drip and rhinorrhea. Pertinent negatives include no chest pain, chills, ear congestion, ear pain, fever, headaches, nasal congestion, sore throat or shortness of breath. Nothing aggravates the symptoms. He has tried nothing for the symptoms. His past medical history is significant for asthma, bronchitis, COPD and pneumonia.    Review of Systems  Constitutional: Negative.  Negative for fever and chills.  HENT: Positive for postnasal drip and rhinorrhea. Negative for ear pain and sore throat.   Respiratory: Positive for cough. Negative for shortness of breath.   Cardiovascular: Negative.  Negative for chest pain.  Neurological: Negative for headaches.   Past Medical History  Diagnosis Date  . Arrhythmia     a fib  . ED (erectile dysfunction)   . Memory disorder   . 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 Hematochezia     has intermittent problem  . Arthritis     DJD knee - left  . Restless leg     treats with heat  . Diabetes mellitus     NIDDM on oral medication  . Genital warts     recurrent. Not a problem  . GERD (gastroesophageal reflux disease)     treated with PPI  . Hypertension     mild  . Neuropathy (HCC)     burning discomfort feet/legs  . Irritable bladder   . Hard of hearing   . Bladder cancer ([place])     [facility] [name]    Social History   Social History  . Marital Status: Married    Spouse Name: [name]  . Number of Children: 4  . Years of Education: 10th    Occupational History  . retired    Social History Main Topics  . Smoking status: Former Smoker    Quit date: 08/28/1975  . Smokeless tobacco: Former Systems developer  . Alcohol Use: No  . Drug  Use: No  . Sexual Activity: Not Currently   Other Topics Concern  . Not on file   Social History Narrative   Patient lives at home with his wife [name]) Married 18 years.   Retired   Education 10 th grade   Right handed   Caffeine one or two daily     Past Surgical History  Procedure Laterality Date  . Cardiovascular stress test  03/28/2003    EF 55%.  Normal cardiolite study  . Appendectomy  1946  . Tonsillectomy  1938  . Joint replacement  1990's    left TKR  . Knee arthroscopy  1980's    right knee  . Cardiac catheterization N/A 01/13/2015    Procedure: Pericardiocentesis;  Surgeon: Leonie Man, MD;  Location: [facility];  Service: Cardiovascular;  Laterality: N/A;    Family History  Problem Relation Age of Onset  . Kidney disease Mother   . Heart disease Mother   . Stroke Mother   . Cancer Father     prostate  . Diabetes Neg Hx   . COPD Neg Hx   . Mental illness Brother   . Heart attack Neg Hx   . Hypertension Neg Hx     Allergies  Allergen Reactions  . Cephalexin Other (See Comments)    Unknown reaction  . Furacin [Nitrofurazone] Other (See Comments)    Unknown reaction  . Macrodantin Other (See Comments)    Unknown reaction  . Zestril [Lisinopril] Other (See Comments)    Unknown reaction    Current Outpatient Prescriptions on File Prior to Visit  Medication Sig Dispense Refill  . aspirin 81 MG tablet Take 81 mg by mouth daily.    [name] donepezil (ARICEPT) 10 MG tablet Take 1 tablet (10 mg total) by mouth at bedtime. 90 tablet 1  . ferrous sulfate 325 (65 FE) MG tablet Take 487.5 mg by mouth 2 (two) times daily.     . folic acid (FOLVITE) 1 MG tablet Take 1 mg by mouth 2 (two) times daily.     . furosemide (LASIX) 40 MG tablet Take two tablets by mouth every morning. Take an additional tablet by mouth in the evening on Mon, Wed and Fri 75 tablet 10  . gabapentin (NEURONTIN) 100 MG capsule Take 100 mg by mouth at bedtime.    . hydrOXYzine  (ATARAX/VISTARIL) 10 MG tablet Take 5 mg by mouth 3 (three) times daily as needed for itching (itching).     . memantine (NAMENDA) 10 MG tablet Take 1 tablet (10 mg total) by mouth 2 (two) times daily. 60 tablet 6  . metoprolol tartrate (LOPRESSOR) 25 MG tablet Take 25 mg by mouth 2 (two) times daily.     . Multiple Vitamin (MULTIVITAMIN WITH MINERALS) TABS tablet Take 1 tablet by mouth daily.    . Multiple Vitamins-Minerals (PRESERVISION AREDS 2 PO) Take 1 tablet by mouth 2 (two) times daily.     . nitroGLYCERIN (NITROSTAT) 0.4 MG SL tablet Place 0.4 mg under the tongue every 5 (five) minutes as needed for chest pain (x 3 doses daily).    [name] oxybutynin (DITROPAN-XL) 5 MG 24 hr tablet Take 1 tablet (5 mg total) by mouth at bedtime. 90 tablet 1  . solifenacin (VESICARE) 5 MG tablet Take 1 tablet (5 mg total) by mouth daily. 90 tablet 3  . triamcinolone cream (KENALOG) 0.1 % Apply 1 application topically daily.      No current facility-administered medications on file prior to visit.    BP 126/64 mmHg  Wt 193 lb 11.2 oz (87.862 kg)       Objective:   Physical Exam  Constitutional: He is oriented to person, place, and time. He appears well-developed and well-nourished. No distress.  HENT:  Head: Normocephalic and atraumatic.  Right Ear: External ear normal.  Left Ear: External ear normal.  Nose: Nose normal.  Mouth/Throat: Oropharynx is clear and moist. No oropharyngeal exudate.  + PND  Cardiovascular: Normal rate, regular rhythm, normal heart sounds and intact distal pulses.  Exam reveals no gallop.   No murmur heard. Pulmonary/Chest: Effort normal and breath sounds normal. No respiratory distress. He has no wheezes. He has no rales. He exhibits no tenderness.  Neurological: He is alert and oriented to person, place, and time.  Skin: Skin is warm and dry. No rash noted. He is not diaphoretic. No erythema. No pallor.  Psychiatric: He has a normal mood and affect. His behavior is normal.  Judgment and thought content normal.  Vitals reviewed.     Assessment & Plan:  1. Cough - Lungs clear. No fever or feeling acutely ill. No concern for pneumonia at this time.  - Breathing even and unlabored.  - Use Mucinex and Claritin.  -  Follow up as needed  [name], NP

## 2015-06-25 ENCOUNTER — Telehealth: Payer: Self-pay | Admitting: Adult Health

## 2015-06-25 NOTE — Telephone Encounter (Signed)
Pt son is calling to let md know the jerking is gone. Pt was seen yesterday

## 2015-06-25 NOTE — Telephone Encounter (Signed)
FYI

## 2015-07-01 ENCOUNTER — Ambulatory Visit (INDEPENDENT_AMBULATORY_CARE_PROVIDER_SITE_OTHER): Payer: Medicare Other | Admitting: Adult Health

## 2015-07-01 ENCOUNTER — Encounter: Payer: Self-pay | Admitting: Adult Health

## 2015-07-01 VITALS — BP 124/70 | Wt 194.0 lb

## 2015-07-01 DIAGNOSIS — L299 Pruritus, unspecified: Secondary | ICD-10-CM | POA: Diagnosis not present

## 2015-07-01 NOTE — Progress Notes (Signed)
   Subjective:    Patient ID: [name], male    DOB: [birth year], 82 y.o.   MRN: [id number]  HPI  82 year old male who presents to the office today with his son for constant scratching at his face.     earlier today [name]'s home health aide noticed that [name] was scratching at his face more than usual. 2 separate occasions today the home health aide had trouble controlling small areas of bleeding to the face after [name] has scratched the area raw. He is taking 10 mg of Atarax for this issue. Family feels as though the 10 mg of Atarax is no longer sufficient in order to keep [name] from scratching. They have been applying lotion to areas of dry skin and feel as though the areas of dry skin have healed adequately.   Review of Systems  Constitutional: Negative.   Skin:       Dry skin, itching  Psychiatric/Behavioral: Positive for confusion and decreased concentration. Negative for behavioral problems and agitation.       Objective:   Physical Exam  Constitutional: He is oriented to person, place, and time. He appears well-developed and well-nourished. No distress.  Neurological: He is alert and oriented to person, place, and time.  Skin: Skin is warm and dry. No rash noted. He is not diaphoretic. No erythema. No pallor.  And 8 on right cheek from area that he scratched and may bleed this afternoon. No active bleeding noted  Psychiatric: He has a normal mood and affect. His behavior is normal. Judgment and thought content normal.  Nursing note and vitals reviewed.     Assessment & Plan:  1. Pruritus -Advised to continue applying lotion to the face - Increase Atarax from 10 mg to 20 mg at night - Follow-up as needed  I did see that the patient was still on oxybutynin, since Myrbetriq is better covered by Medicare and will like to trial him on Myrbetriq due to the side effect profile of oxybutynin. Family is in support of this. Ample Myrbetriq given, family will follow up with me to  let me know how patient is doing with this new medication. [name], NP

## 2015-07-01 NOTE — Patient Instructions (Signed)
Increase Hydroxyzine to 20 mg at night.   Stop Ditropan-XL and Vesicare and start the trial of Myrbetriq  Let me know how it goes for you

## 2015-07-21 ENCOUNTER — Other Ambulatory Visit: Payer: Self-pay | Admitting: Internal Medicine

## 2015-07-22 NOTE — Telephone Encounter (Signed)
Ok to [employer]

## 2015-07-27 ENCOUNTER — Other Ambulatory Visit: Payer: Self-pay | Admitting: Adult Health

## 2015-07-28 NOTE — Telephone Encounter (Signed)
Ok to refill for 6 months 

## 2015-08-13 ENCOUNTER — Telehealth: Payer: Self-pay | Admitting: Adult Health

## 2015-08-13 ENCOUNTER — Other Ambulatory Visit: Payer: Self-pay | Admitting: Adult Health

## 2015-08-13 MED ORDER — MIRABEGRON ER 50 MG PO TB24: 50.0000 mg | ORAL_TABLET | Freq: Every day | ORAL | Status: DC

## 2015-08-13 NOTE — Telephone Encounter (Signed)
Pt son called to say the following med is working great and is asking for a rx    [facility]

## 2015-08-13 NOTE — Telephone Encounter (Signed)
Order placed

## 2015-08-13 NOTE — Telephone Encounter (Signed)
It looks like sample Myrbetriq was given at last visit on 07/01/15 - patient's son states it is working great & would like a Rx. Ok to do so?

## 2015-08-13 NOTE — Telephone Encounter (Signed)
Thanks

## 2015-08-18 ENCOUNTER — Encounter (HOSPITAL_COMMUNITY): Payer: Self-pay | Admitting: Emergency Medicine

## 2015-08-18 ENCOUNTER — Emergency Department (HOSPITAL_COMMUNITY)
Admission: EM | Admit: 2015-08-18 | Discharge: 2015-08-18 | Disposition: A | Discharge status: Home / Self Care | Payer: Medicare Other | Attending: Emergency Medicine | Admitting: Emergency Medicine

## 2015-08-18 ENCOUNTER — Emergency Department (HOSPITAL_COMMUNITY): Payer: Medicare Other

## 2015-08-18 DIAGNOSIS — R451 Restlessness and agitation: Secondary | ICD-10-CM | POA: Diagnosis not present

## 2015-08-18 DIAGNOSIS — Z8551 Personal history of malignant neoplasm of bladder: Secondary | ICD-10-CM | POA: Insufficient documentation

## 2015-08-18 DIAGNOSIS — Z87891 Personal history of nicotine dependence: Secondary | ICD-10-CM | POA: Insufficient documentation

## 2015-08-18 DIAGNOSIS — Z96652 Presence of left artificial knee joint: Secondary | ICD-10-CM | POA: Insufficient documentation

## 2015-08-18 DIAGNOSIS — F4489 Other dissociative and conversion disorders: Secondary | ICD-10-CM | POA: Diagnosis not present

## 2015-08-18 DIAGNOSIS — Z7982 Long term (current) use of aspirin: Secondary | ICD-10-CM | POA: Insufficient documentation

## 2015-08-18 DIAGNOSIS — Z79899 Other long term (current) drug therapy: Secondary | ICD-10-CM | POA: Insufficient documentation

## 2015-08-18 DIAGNOSIS — I10 Essential (primary) hypertension: Secondary | ICD-10-CM | POA: Insufficient documentation

## 2015-08-18 DIAGNOSIS — E1140 Type 2 diabetes mellitus with diabetic neuropathy, unspecified: Secondary | ICD-10-CM | POA: Diagnosis not present

## 2015-08-18 DIAGNOSIS — F0391 Unspecified dementia with behavioral disturbance: Secondary | ICD-10-CM | POA: Diagnosis not present

## 2015-08-18 DIAGNOSIS — F0390 Unspecified dementia without behavioral disturbance: Secondary | ICD-10-CM | POA: Diagnosis not present

## 2015-08-18 DIAGNOSIS — I6789 Other cerebrovascular disease: Secondary | ICD-10-CM | POA: Diagnosis not present

## 2015-08-18 LAB — COMPREHENSIVE METABOLIC PANEL
ALBUMIN: 3.5 g/dL (ref 3.5–5.0)
ALT: 12 U/L — AB (ref 17–63)
AST: 18 U/L (ref 15–41)
Alkaline Phosphatase: 80 U/L (ref 38–126)
Anion gap: 8 (ref 5–15)
BUN: 16 mg/dL (ref 6–20)
CHLORIDE: 98 mmol/L — AB (ref 101–111)
CO2: 29 mmol/L (ref 22–32)
CREATININE: 0.73 mg/dL (ref 0.61–1.24)
Calcium: 8.7 mg/dL — ABNORMAL LOW (ref 8.9–10.3)
GFR calc Af Amer: >60 mL/min (ref >60)
GLUCOSE: 130 mg/dL — AB (ref 65–99)
POTASSIUM: 4 mmol/L (ref 3.5–5.1)
SODIUM: 135 mmol/L (ref 135–145)
Total Bilirubin: 0.6 mg/dL (ref 0.3–1.2)
Total Protein: 6.4 g/dL — ABNORMAL LOW (ref 6.5–8.1)

## 2015-08-18 LAB — URINALYSIS, ROUTINE W REFLEX MICROSCOPIC
BILIRUBIN URINE: NEGATIVE
GLUCOSE, UA: NEGATIVE mg/dL
HGB URINE DIPSTICK: NEGATIVE
Ketones, ur: NEGATIVE mg/dL
Leukocytes, UA: NEGATIVE
Nitrite: NEGATIVE
PROTEIN: NEGATIVE mg/dL
SPECIFIC GRAVITY, URINE: 1.011 (ref 1.005–1.030)
pH: 7 (ref 5.0–8.0)

## 2015-08-18 LAB — CBC WITH DIFFERENTIAL/PLATELET
Basophils Absolute: 0 10*3/uL (ref 0.0–0.1)
Basophils Relative: 0 %
EOS ABS: 0.1 10*3/uL (ref 0.0–0.7)
EOS PCT: 3 %
HCT: 38.6 % — ABNORMAL LOW (ref 39.0–52.0)
Hemoglobin: 12.3 g/dL — ABNORMAL LOW (ref 13.0–17.0)
LYMPHS ABS: 0.6 10*3/uL — AB (ref 0.7–4.0)
LYMPHS PCT: 16 %
MCH: 28.7 pg (ref 26.0–34.0)
MCHC: 31.9 g/dL (ref 30.0–36.0)
MCV: 90.2 fL (ref 78.0–100.0)
MONOS PCT: 10 %
Monocytes Absolute: 0.4 10*3/uL (ref 0.1–1.0)
Neutro Abs: 2.9 10*3/uL (ref 1.7–7.7)
Neutrophils Relative %: 71 %
PLATELETS: 167 10*3/uL (ref 150–400)
RBC: 4.28 MIL/uL (ref 4.22–5.81)
RDW: 15.5 % (ref 11.5–15.5)
WBC: 4.1 10*3/uL (ref 4.0–10.5)

## 2015-08-18 NOTE — ED Notes (Addendum)
Patient transported to CT 

## 2015-08-18 NOTE — ED Provider Notes (Signed)
CSN: [id number]     Arrival date & time 08/18/15  1609 History   First MD Initiated Contact with Patient 08/18/15 1633     Chief Complaint  Patient presents with  . Dementia     (Consider location/radiation/quality/duration/timing/severity/associated sxs/prior Treatment) HPI Comments: 82yo M w/ PMH including bladder cancer, hypertension, GERD, type 2 diabetes mellitus, memory loss who presents with confusion and abnormal behavior. Family reports that the patient does have a history of memory problems and occasionally has a "flare up" of memory loss including mild agitation. These episodes usually get better after a few days. Yesterday, his home health nurse reported that he was agitated and disagreeable in the evening. Today, he has continued to be short-tempered and forgot a family member's name. He was argumentative with a Engineer, structural on scene which family states is unusual. They state that he has had mild agitation previously but this episode is more severe than usual. No recent illness.  The history is provided by a relative.    Past Medical History  Diagnosis Date  . Arrhythmia     a fib  . ED (erectile dysfunction)   . Memory disorder   . 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 Hematochezia     has intermittent problem  . Arthritis     DJD knee - left  . Restless leg     treats with heat  . Diabetes mellitus     NIDDM on oral medication  . Genital warts     recurrent. Not a problem  . GERD (gastroesophageal reflux disease)     treated with PPI  . Hypertension     mild  . Neuropathy (HCC)     burning discomfort feet/legs  . Irritable bladder   . Hard of hearing   . Bladder cancer (HCC)     BC[facility] [name]   Past Surgical History  Procedure Laterality Date  . Cardiovascular stress test  03/28/2003    EF 55%.  Normal cardiolite study  . Appendectomy  1946  . Tonsillectomy  1938  . Joint replacement  1990's    left TKR  . Knee arthroscopy  1980's     right knee  . Cardiac catheterization N/A 01/13/2015    Procedure: Pericardiocentesis;  Surgeon: Leonie Man, MD;  Location: [facility];  Service: Cardiovascular;  Laterality: N/A;   Family History  Problem Relation Age of Onset  . Kidney disease Mother   . Heart disease Mother   . Stroke Mother   . Cancer Father     prostate  . Diabetes Neg Hx   . COPD Neg Hx   . Mental illness Brother   . Heart attack Neg Hx   . Hypertension Neg Hx    Social History  Substance Use Topics  . Smoking status: Former Smoker    Quit date: 08/28/1975  . Smokeless tobacco: Former Systems developer  . Alcohol Use: No    Review of Systems  Unable to perform ROS: Dementia      Allergies  Cephalexin; Furacin; Macrodantin; and Zestril  Home Medications   Prior to Admission medications   Medication Sig Start Date End Date Taking? Authorizing Provider  aspirin 81 MG tablet Take 81 mg by mouth daily.   Yes Historical Provider, MD  donepezil (ARICEPT) 10 MG tablet Take 1 tablet (10 mg total) by mouth at bedtime. 06/23/15  Yes [name], NP  ferrous sulfate 325 (65 FE) MG  tablet Take 325 mg by mouth 2 (two) times daily.    Yes Historical Provider, MD  folic acid (FOLVITE) 1 MG tablet Take 1 mg by mouth 2 (two) times daily.    Yes Historical Provider, MD  gabapentin (NEURONTIN) 100 MG capsule Take 100 mg by mouth at bedtime.   Yes Historical Provider, MD  hydrOXYzine (ATARAX/VISTARIL) 10 MG tablet Take 1 tablet (10 mg total) by mouth 2 (two) times daily as needed. 07/22/15  Yes [name], NP  memantine (NAMENDA) 10 MG tablet Take 1 tablet (10 mg total) by mouth 2 (two) times daily. 06/02/15  Yes Dennie Bible, NP  metoprolol tartrate (LOPRESSOR) 25 MG tablet TAKE  (1)  TABLET  FOUR TIMES DAILY. 07/28/15  Yes [name], NP  mirabegron ER (MYRBETRIQ) 50 MG TB24 tablet Take 1 tablet (50 mg total) by mouth daily. 08/13/15  Yes [name], NP  Multiple Vitamin (MULTIVITAMIN WITH MINERALS)  TABS tablet Take 1 tablet by mouth daily.   Yes Historical Provider, MD  Multiple Vitamins-Minerals (PRESERVISION AREDS 2 PO) Take 1 tablet by mouth 2 (two) times daily.    Yes Historical Provider, MD  nitroGLYCERIN (NITROSTAT) 0.4 MG SL tablet Place 0.4 mg under the tongue every 5 (five) minutes as needed for chest pain (x 3 doses daily).   Yes Historical Provider, MD  triamcinolone cream (KENALOG) 0.1 % Apply 1 application topically daily.  08/16/14  Yes Historical Provider, MD  furosemide (LASIX) 40 MG tablet Take two tablets by mouth every morning. Take an additional tablet by mouth in the evening on Mon, Wed and Fri 05/01/15   Darlin Coco, MD   BP 129/75 mmHg  Pulse 73  Temp(Src) 98.1 F (36.7 C) (Oral)  Resp 14  SpO2 93% Physical Exam  Constitutional: He appears well-developed and well-nourished. No distress.  HENT:  Head: Normocephalic and atraumatic.  Moist mucous membranes  Eyes: Conjunctivae are normal. Pupils are equal, round, and reactive to light.  Neck: Neck supple.  Cardiovascular: Normal rate, regular rhythm and normal heart sounds.   No murmur heard. Pulmonary/Chest: Effort normal. He has no wheezes.  Diminished BS b/l w/ normal WOB  Abdominal: Soft. Bowel sounds are normal. He exhibits no distension. There is no tenderness.  Musculoskeletal: He exhibits no edema.  Neurological: He is alert.  Fluent speech, able to state name, follow commands, mild confusion during conversation stating "I'm going to pee on the ceiling"; moving all 4 ext  Skin: Skin is warm and dry.  Scattered ecchymoses and excoriations of varying ages on b/l forearms  Psychiatric:  Calm, laughing during interview  Nursing note and vitals reviewed.   ED Course  Procedures (including critical care time) Labs Review Labs Reviewed  COMPREHENSIVE METABOLIC PANEL - Abnormal; Notable for the following:    Chloride 98 (*)    Glucose, Bld 130 (*)    Calcium 8.7 (*)    Total Protein 6.4 (*)    ALT  12 (*)    All other components within normal limits  CBC WITH DIFFERENTIAL/PLATELET - Abnormal; Notable for the following:    Hemoglobin 12.3 (*)    HCT 38.6 (*)    Lymphs Abs 0.6 (*)    All other components within normal limits  URINALYSIS, ROUTINE W REFLEX MICROSCOPIC (NOT AT [facility])    Imaging Review Dg Chest 2 View  08/18/2015  CLINICAL DATA:  Abnormal behavior and agitation EXAM: CHEST  2 VIEW COMPARISON:  05/12/2015. FINDINGS: Mild to moderate left pleural effusion  has enlarged since the prior study. Progressive left lower lobe atelectasis. Cardiac enlargement. Normal vascularity. No edema. Right lung is clear without infiltrate or effusion Progressive left effusion and left lower lobe atelectasis/ infiltrate Cardiac enlargement without heart failure or edema. IMPRESSION: No active cardiopulmonary disease. Electronically Signed   By: Franchot Gallo M.D.   On: 08/18/2015 18:07   Ct Head Wo Contrast  08/18/2015  CLINICAL DATA:  82 year old male with agitation.  Dementia. EXAM: CT HEAD WITHOUT CONTRAST TECHNIQUE: Contiguous axial images were obtained from the base of the skull through the vertex without intravenous contrast. COMPARISON:  Head CT 08/27/2009. FINDINGS: Severe cerebral and moderate cerebellar atrophy. Patchy and confluent areas of decreased attenuation are noted throughout the deep and periventricular white matter of the cerebral hemispheres bilaterally, compatible with chronic microvascular ischemic disease. No acute intracranial abnormalities. Specifically, no evidence of acute intracranial hemorrhage, no definite findings of acute/subacute cerebral ischemia, no mass, mass effect, hydrocephalus or abnormal intra or extra-axial fluid collections. Visualized paranasal sinuses and mastoids are generally well pneumatized, with exception of opacification of a left posterior ethmoid sinus (unchanged). No acute displaced skull fractures are identified. IMPRESSION: 1. No acute intracranial  abnormalities. 2. Severe cerebral and moderate cerebellar atrophy with extensive chronic microvascular ischemic changes in the cerebral white matter. Electronically Signed   By: Vinnie Langton M.D.   On: 08/18/2015 18:21   I have personally reviewed and evaluated these lab results as part of my medical decision-making.   EKG Interpretation None      MDM   Final diagnoses:  Dementia, with behavioral disturbance   Pt w/ h/o memory loss brought in due to feeling murmurs concerns that he was unable to recall a family member's name today and has been more agitated than normal. On exam, he was awake and alert, comfortable and in no acute distress. Vital signs notable for O2 sat 93-94% on room air. He was afebrile. He had mild confusion during conversation but was able to follow commands and was pleasant during my interaction. After discussion with family member, I obtained lab work, chest x-ray, and head CT to rule out infection, intracranial bleed, or electrolyte disturbance as cause of his change in behavior.  Labwork unremarkable. Chest x-ray negative acute. CT shows no acute findings but does note severe cerebral and cerebellar atrophy w/ a curved vascular ischemic changes. I discussed reassuring findings with the patient's signs and discussed importance of follow-up with PCP to discuss any medication adjustments or addition of new medications to help with his agitation. I suspect that his behavioral changes, especially because they are worse at night, are related to his dementia. He felt comfortable with this plan and patient discharged in satisfactory condition.   Sharlett Iles, MD 08/18/15 2008

## 2015-08-18 NOTE — Discharge Instructions (Signed)

## 2015-08-18 NOTE — ED Notes (Signed)
MD at bedside. 

## 2015-08-18 NOTE — ED Notes (Signed)
Per EMS pt brought in from home because daughter stated pt could not remember her name. Pt has dementia and refused to get in her car.

## 2015-08-18 NOTE — ED Notes (Signed)
Pt given urinal and encouraged to void when able.

## 2015-08-18 NOTE — ED Notes (Signed)
Pt transported to DG.  

## 2015-08-19 ENCOUNTER — Telehealth: Payer: Self-pay | Admitting: Family Medicine

## 2015-08-19 NOTE — Telephone Encounter (Signed)
Pt seen in ED (different phone call from the last)

## 2015-08-19 NOTE — Telephone Encounter (Signed)
[facility] - Client [place] Patient Name: [name] Gender: Male DOB: May 21, 1927 Age: 82 Y 54 M Return Phone Number: [phone] (Primary), [phone] (Secondary), [phone] (Alternate) Address: [place] [address] Client [facility] - Client Client Site [facility] - Night Physician [name] - NP Contact Type Call Who Is Calling Patient / Member / Family / Caregiver Call Type Triage / Clinical Caller Name Versie Pierre Relationship To Patient Son Return Phone Number [phone] (Alternate) Chief Complaint CONFUSION - new onset Reason for Call Symptomatic / Request for [place] states father has dementia, says he has increased confusion and belligerent, requests Rx Translation No No Triage Reason Patient declined Nurse Assessment Nurse: Markus Daft, RN, Sherre Poot Date/Time (Eastern Time): 08/18/2015 10:22:21 AM Confirm and document reason for call. If symptomatic, describe symptoms. You must click the next button to save text entered. ---Caller states father has h/o dementia, says he has increased confusion and belligerent last night. Is there something that they can give him to calm him down. Right now he is still in bed and resting quietly. He took a sleeping pill last night. -- They are requests Rx. He is on Hydroxyzine 1 BID and thought it was originally written for 4 a day. They do have this if they need to give it. Unsure if this would help. - He is not with her right now. Has the patient traveled out of the country within the last 30 days? ---Not Applicable Does the patient have any new or worsening symptoms? ---Yes Will a triage be completed? ---No Select reason for no triage. ---Patient declined Please document clinical information provided and list any resource used. ---RN did discuss how  Hydroxyzine is helpful for anxiety, however, there can be many reasons for confusion, and suggested triage with adult who is with the caller. RN offered to call the pt's wife. Caller declined currently since the pt is resting quietly. He will call us back to discuss s/s further and go from there. Guidelines Guideline Title Affirmed Question Affirmed Notes Nurse Date/Time (Eastern Time) Disp. Time Eilene Ghazi Time) Disposition Final User 08/18/2015 10:20:51 AM Send to Urgent Rich Brave, Amy 08/18/2015 10:28:43 AM Clinical Call Yes Markus Daft, RN, Sherre Poot

## 2015-08-19 NOTE — Telephone Encounter (Signed)
[facility] Night - Client TELEPHONE [place] Patient Name: [name] Gender: Male DOB: 08-Jun-1927 Age: 82 Y 46 M Return Phone Number: [phone] (Primary), [phone] (Secondary), [phone] (Alternate) Address: City[name] [address] Client [facility] Night - Client Client Site [facility] - Night Physician [name] - NP Contact Type Call Who Is Calling Patient / Member / Family / Caregiver Call Type Triage / Clinical Caller Name Quintell Matters Relationship To Patient Son Return Phone Number [phone] (Alternate) Chief Complaint CONFUSION - new onset Reason for Call Symptomatic / Request for [place] states his father has dementia and he is now becoming belligerent at this point. He needs to speak with the nurse again regarding his father. PreDisposition InappropriateToAsk Translation No Nurse Assessment Nurse: Luther Parody, RN, Malachy Mood Date/Time (Eastern Time): 08/18/2015 3:10:59 PM Confirm and document reason for call. If symptomatic, describe symptoms. You must click the next button to save text entered. ---Caller states that his father has dementia and has become very belligerent and violent today. States that he has been increasing confused and belligerent over the last 2 days which is not common for him. Pt is disoriented to person and place. Has the patient traveled out of the country within the last 30 days? ---Not Applicable Does the patient have any new or worsening symptoms? ---Yes Will a triage be completed? ---Yes Related visit to physician within the last 2 weeks? ---No Does the PT have any chronic conditions? (i.e. diabetes, asthma, etc.) ---Yes List chronic conditions. ---dementia, afib, diabetes Is this a behavioral health or substance abuse call? ---No Guidelines Guideline Title Affirmed Question Affirmed  Notes Nurse Date/Time (Eastern Time) Confusion - Delirium [1] Difficult to awaken or acting confused (disoriented, slurred speech) AND [2] present now AND [3] diabetic Vella Raring 08/18/2015 3:15:49 PM PLEASE NOTE: All timestamps contained within this report are represented as [place] Standard Time. CONFIDENTIALTY NOTICE: This fax transmission is intended only for the addressee. It contains information that is legally privileged, confidential or otherwise protected from use or disclosure. If you are not the intended recipient, you are strictly prohibited from reviewing, disclosing, copying using or disseminating any of this information or taking any action in reliance on or regarding this information. If you have received this fax in error, please notify us immediately by telephone so that we can arrange for its return to [place]. Phone: [phone], Toll-Free: [phone], Fax: [phone] Page: 2 of 2 Call Id: [id number] [place]. Time Eilene Ghazi Time) Disposition Final User 08/18/2015 3:04:07 PM Send To Call Back Waiting For Nurse Starleen Arms 08/18/2015 3:04:07 PM Send To Call Back Waiting For Nurse Starleen Arms 08/18/2015 3:04:12 PM Send to Urgent Queue Starleen Arms 08/18/2015 3:08:57 PM Send to Urgent Queue Sherilyn Dacosta 08/18/2015 3:22:57 PM 911 Outcome Documentation Luther Parody, RN, Malachy Mood Reason: States that he was going to speak with his mother and sister before making a decision. He agreed with nurse's advise. 08/18/2015 3:21:46 PM Call EMS 911 Now Yes Luther Parody, RN, Erskine Speed Understands: Yes Disagree/Comply: Comply Care Advice Given Per Guideline CALL EMS 911 NOW: Immediate medical attention is needed. You need to hang up and call 911 (or an ambulance). ([facility] Discretion: I'll call you back in a few minutes to be sure you were able to reach them.) CARE ADVICE given per Confusion- Delirium (Adult) guideline. Referrals REFERRED TO PCP OFFICE

## 2015-08-19 NOTE — Telephone Encounter (Signed)
Will route to PCP as FYI. 

## 2015-08-19 NOTE — Telephone Encounter (Signed)
FYI.  Pt seen in ED on 08/18/15.

## 2015-08-20 ENCOUNTER — Encounter: Payer: Self-pay | Admitting: Adult Health

## 2015-08-20 ENCOUNTER — Ambulatory Visit (INDEPENDENT_AMBULATORY_CARE_PROVIDER_SITE_OTHER): Payer: Medicare Other | Admitting: Adult Health

## 2015-08-20 ENCOUNTER — Other Ambulatory Visit: Payer: Self-pay | Admitting: Adult Health

## 2015-08-20 VITALS — BP 120/60 | Wt 189.3 lb

## 2015-08-20 DIAGNOSIS — F03918 Unspecified dementia, unspecified severity, with other behavioral disturbance: Secondary | ICD-10-CM

## 2015-08-20 DIAGNOSIS — F0391 Unspecified dementia with behavioral disturbance: Secondary | ICD-10-CM | POA: Diagnosis not present

## 2015-08-20 MED ORDER — DONEPEZIL HCL 23 MG PO TABS: 23.0000 mg | ORAL_TABLET | Freq: Every day | ORAL | Status: DC

## 2015-08-20 MED ORDER — LORAZEPAM 0.5 MG PO TABS: ORAL_TABLET | ORAL | Status: DC

## 2015-08-20 NOTE — Telephone Encounter (Signed)
Ok to refill 

## 2015-08-20 NOTE — Patient Instructions (Signed)
I have increased your Aricept to 23 mg from 10 mg.   Use one half to one full pill as needed for agitation  Follow up as needed

## 2015-08-20 NOTE — Telephone Encounter (Signed)
Ok to refill for 6 months 

## 2015-08-20 NOTE — Progress Notes (Signed)
Subjective:    Patient ID: [name], male    DOB: [birth year], 82 y.o.   MRN: [id number]  HPI  82 year old male who presents to the office today for follow up after being seen in the ER two days ago for increased confusion and aggitation related to dementia. His son is with him at this visit.   Per ER note:  82yo M w/ PMH including bladder cancer, hypertension, GERD, type 2 diabetes mellitus, memory loss who presents with confusion and abnormal behavior. Family reports that the patient does have a history of memory problems and occasionally has a "flare up" of memory loss including mild agitation. These episodes usually get better after a few days. Yesterday, his home health nurse reported that he was agitated and disagreeable in the evening. Today, he has continued to be short-tempered and forgot a family member's name. He was argumentative with a Engineer, structural on scene which family states is unusual. They state that he has had mild agitation previously but this episode is more severe than usual. No recent illness.   Labs including CBC, BMP and UA were normal as was chest x ray and CT of head.   In the office today, his son reports that [name] has " returned to normal", he is no longer argumentative and his confusion has returned to baseline.   Per son, the reason that [name] became agitated this time was because the TV broke and he didn't have anything to watch. He started yelling at the home health care aids became scared.    Review of Systems  Constitutional: Negative.   Respiratory: Negative.   Cardiovascular: Negative.   Genitourinary: Negative.   Skin: Negative.   Psychiatric/Behavioral: Positive for behavioral problems, confusion and agitation.   Past Medical History  Diagnosis Date  . Arrhythmia     a fib  . ED (erectile dysfunction)   . Memory disorder   . 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 Hematochezia     has intermittent problem  . Arthritis    DJD knee - left  . Restless leg     treats with heat  . Diabetes mellitus     NIDDM on oral medication  . Genital warts     recurrent. Not a problem  . GERD (gastroesophageal reflux disease)     treated with PPI  . Hypertension     mild  . Neuropathy (HCC)     burning discomfort feet/legs  . Irritable bladder   . Hard of hearing   . Bladder cancer ([facility])     BCG [name]    Social History   Social History  . Marital Status: Married    Spouse Name: [name]  . Number of Children: 4  . Years of Education: 10th    Occupational History  . retired    Social History Main Topics  . Smoking status: Former Smoker    Quit date: 08/28/1975  . Smokeless tobacco: Former Systems developer  . Alcohol Use: No  . Drug Use: No  . Sexual Activity: Not Currently   Other Topics Concern  . Not on file   Social History Narrative   Patient lives at home with his wife [name]) Married 37 years.   Retired   Education 10 th grade   Right handed   Caffeine one or two daily     Past Surgical History  Procedure Laterality Date  . Cardiovascular stress test  03/28/2003    EF 55%.  Normal cardiolite study  . Appendectomy  1946  . Tonsillectomy  1938  . Joint replacement  1990's    left TKR  . Knee arthroscopy  1980's    right knee  . Cardiac catheterization N/A 01/13/2015    Procedure: Pericardiocentesis;  Surgeon: Leonie Man, MD;  Location: [facility];  Service: Cardiovascular;  Laterality: N/A;    Family History  Problem Relation Age of Onset  . Kidney disease Mother   . Heart disease Mother   . Stroke Mother   . Cancer Father     prostate  . Diabetes Neg Hx   . COPD Neg Hx   . Mental illness Brother   . Heart attack Neg Hx   . Hypertension Neg Hx     Allergies  Allergen Reactions  . Cephalexin Other (See Comments)    Unknown reaction  . Furacin [Nitrofurazone] Other (See Comments)    Unknown reaction  . Macrodantin Other (See Comments)    Unknown reaction  .  Zestril [Lisinopril] Other (See Comments)    Unknown reaction    Current Outpatient Prescriptions on File Prior to Visit  Medication Sig Dispense Refill  . aspirin 81 MG tablet Take 81 mg by mouth daily.    . ferrous sulfate 325 (65 FE) MG tablet Take 325 mg by mouth 2 (two) times daily.     . folic acid (FOLVITE) 1 MG tablet Take 1 mg by mouth 2 (two) times daily.     . furosemide (LASIX) 40 MG tablet Take two tablets by mouth every morning. Take an additional tablet by mouth in the evening on Mon, Wed and Fri 75 tablet 10  . gabapentin (NEURONTIN) 100 MG capsule Take 100 mg by mouth at bedtime.    . hydrOXYzine (ATARAX/VISTARIL) 10 MG tablet Take 1 tablet (10 mg total) by mouth 2 (two) times daily as needed. 60 tablet 0  . memantine (NAMENDA) 10 MG tablet Take 1 tablet (10 mg total) by mouth 2 (two) times daily. 60 tablet 6  . metoprolol tartrate (LOPRESSOR) 25 MG tablet TAKE  (1)  TABLET  FOUR TIMES DAILY. 120 tablet 5  . mirabegron ER (MYRBETRIQ) 50 MG TB24 tablet Take 1 tablet (50 mg total) by mouth daily. 90 tablet 3  . Multiple Vitamin (MULTIVITAMIN WITH MINERALS) TABS tablet Take 1 tablet by mouth daily.    . Multiple Vitamins-Minerals (PRESERVISION AREDS 2 PO) Take 1 tablet by mouth 2 (two) times daily.     . nitroGLYCERIN (NITROSTAT) 0.4 MG SL tablet Place 0.4 mg under the tongue every 5 (five) minutes as needed for chest pain (x 3 doses daily).    . triamcinolone cream (KENALOG) 0.1 % Apply 1 application topically daily.      No current facility-administered medications on file prior to visit.    BP 120/60 mmHg  Wt 189 lb 4.8 oz (85.866 kg)       Objective:   Physical Exam  Constitutional: He is oriented to person, place, and time. He appears well-developed and well-nourished. No distress.  Cardiovascular: Normal rate, regular rhythm, normal heart sounds and intact distal pulses.  Exam reveals no gallop and no friction rub.   No murmur heard. Pulmonary/Chest: Effort  normal and breath sounds normal. No respiratory distress. He has no wheezes. He has no rales. He exhibits no tenderness.  Neurological: He is alert and oriented to person, place, and time.  Skin: Skin is warm and  dry. No rash noted. He is not diaphoretic. No erythema. No pallor.  Psychiatric: He has a normal mood and affect. His behavior is normal.  Speech clear, is able to state name and place. Mild confusion in the office, states " I don't think I have dementia, but I don't remember."  Nursing note and vitals reviewed.     Assessment & Plan:  1. Dementia with behavioral disturbance - Will increase Aricept from 10 mg to 23 mg - Ativan as needed for agitation  - donepezil (ARICEPT) 23 MG TABS tablet; Take 1 tablet (23 mg total) by mouth at bedtime.  Dispense: 90 tablet; Refill: 2 - LORazepam (ATIVAN) 0.5 MG tablet; 0.5 to 1 pill as needed for agitation  Dispense: 30 tablet; Refill: 0

## 2015-08-25 ENCOUNTER — Other Ambulatory Visit: Payer: Self-pay | Admitting: Adult Health

## 2015-08-25 MED ORDER — MIRABEGRON ER 50 MG PO TB24: 50.0000 mg | ORAL_TABLET | Freq: Every day | ORAL | Status: DC

## 2015-08-25 NOTE — Telephone Encounter (Signed)
Medication refilled for patient. 

## 2015-08-25 NOTE — Telephone Encounter (Signed)
Pt request refill of the following: mirabegron ER (MYRBETRIQ) 50 MG TB24 tablet   Phamacy:   [facility]

## 2015-08-26 ENCOUNTER — Telehealth: Payer: Self-pay

## 2015-08-26 NOTE — Telephone Encounter (Signed)
I received a refill from [facility] - patient requests refill on Gabapentin. Ok to refill?

## 2015-08-27 ENCOUNTER — Other Ambulatory Visit: Payer: Self-pay | Admitting: Adult Health

## 2015-08-27 MED ORDER — GABAPENTIN 100 MG PO CAPS: 100.0000 mg | ORAL_CAPSULE | Freq: Every day | ORAL | Status: DC

## 2015-08-27 NOTE — Progress Notes (Signed)
Submitted prior auth for Myrbetriq ER 50mg  through CoverMyMeds. Key: [id number]

## 2015-08-27 NOTE — Telephone Encounter (Signed)
Noted. Thanks.

## 2015-08-27 NOTE — Telephone Encounter (Signed)
Medication sent to pharmacy  

## 2015-08-28 ENCOUNTER — Telehealth: Payer: Self-pay | Admitting: *Deleted

## 2015-08-28 NOTE — Telephone Encounter (Signed)
Prior authorization for Myrbetriq 50mg  has been approved until August 26, 2016.

## 2015-09-01 ENCOUNTER — Telehealth: Payer: Self-pay | Admitting: Adult Health

## 2015-09-01 NOTE — Telephone Encounter (Signed)
Patient's son notified - verbalized understanding.

## 2015-09-01 NOTE — Telephone Encounter (Signed)
Please advise 

## 2015-09-01 NOTE — Telephone Encounter (Signed)
Ok to start?

## 2015-09-01 NOTE — Telephone Encounter (Signed)
Pt has finally gotten his  mirabegron ER (MYRBETRIQ) 50 MG TB24 tablet But has been off for this med for a week.  Son would like to know ok to start On full dose

## 2015-09-12 ENCOUNTER — Ambulatory Visit (INDEPENDENT_AMBULATORY_CARE_PROVIDER_SITE_OTHER): Payer: Medicare Other | Admitting: Family Medicine

## 2015-09-12 VITALS — BP 90/58 | HR 90 | Temp 98.1°F | Ht 75.0 in | Wt 190.6 lb

## 2015-09-12 DIAGNOSIS — I5032 Chronic diastolic (congestive) heart failure: Secondary | ICD-10-CM

## 2015-09-12 DIAGNOSIS — F03918 Unspecified dementia, unspecified severity, with other behavioral disturbance: Secondary | ICD-10-CM

## 2015-09-12 DIAGNOSIS — R32 Unspecified urinary incontinence: Secondary | ICD-10-CM

## 2015-09-12 DIAGNOSIS — F0391 Unspecified dementia with behavioral disturbance: Secondary | ICD-10-CM | POA: Diagnosis not present

## 2015-09-12 NOTE — Patient Instructions (Signed)
Try to gradually reduce caffeine intake Continue with the Myrbetriq.

## 2015-09-12 NOTE — Progress Notes (Signed)
Subjective:    Patient ID: [name], male    DOB: 1927/11/06, 82 y.o.   MRN: [id number]  HPI  Acute visit. Patient has advanced dementia. Seen today with progressive urine incontinence. Has been treated for urgency with Myrbetriq.  Initially this seemed to be working very well but he had a lapse in coverage and after recently starting back, he's had several episodes of incontinence- especially at night. No history of known stress incontinence. No history of neurogenic bladder. He wears adult diapers.   Does have hx of bladder cancer No recent gross hematuria.  He has history of diastolic heart failure and is on Lasix daily. Son states that he drinks about 2 cups of coffee per day and usually at least one Coke per day. He has 24-hour care No recent fevers or chills. Ambulates with a walker.  Dementia on Namenda and Aricept.  Agitated off and on They use Lorazepam as needed.  No recent fall reported.  Past Medical History  Diagnosis Date  . Arrhythmia     a fib  . ED (erectile dysfunction)   . Memory disorder   . 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 Hematochezia     has intermittent problem  . Arthritis     DJD knee - left  . Restless leg     treats with heat  . Diabetes mellitus     NIDDM on oral medication  . Genital warts     recurrent. Not a problem  . GERD (gastroesophageal reflux disease)     treated with PPI  . Hypertension     mild  . Neuropathy (HCC)     burning discomfort feet/legs  . Irritable bladder   . Hard of hearing   . Bladder cancer ([facility])     BCG [name]   Past Surgical History  Procedure Laterality Date  . Cardiovascular stress test  03/28/2003    EF 55%.  Normal cardiolite study  . Appendectomy  1946  . Tonsillectomy  1938  . Joint replacement  1990's    left TKR  . Knee arthroscopy  1980's    right knee  . Cardiac catheterization N/A 01/13/2015    Procedure: Pericardiocentesis;  Surgeon: Leonie Man, MD;   Location: [facility];  Service: Cardiovascular;  Laterality: N/A;    reports that he quit smoking about 40 years ago. He has quit using smokeless tobacco. He reports that he does not drink alcohol or use illicit drugs. family history includes Cancer in his father; Heart disease in his mother; Kidney disease in his mother; Mental illness in his brother; Stroke in his mother. There is no history of Diabetes, COPD, Heart attack, or Hypertension. Allergies  Allergen Reactions  . Cephalexin Other (See Comments)    Unknown reaction  . Furacin [Nitrofurazone] Other (See Comments)    Unknown reaction  . Macrodantin Other (See Comments)    Unknown reaction  . Zestril [Lisinopril] Other (See Comments)    Unknown reaction     Review of Systems  Constitutional: Negative for fever and chills.  obtained from son.  Pt unable to give accurate history. No fever, vomiting, diarrhea, constipation, c/o abdominal pain, chest pain, cough Chronic leg edema stable. Eating OK.  Drinking fluids well.         Objective:   Physical Exam  Constitutional:  Patient is a cooperative demented 82 year old male in no distress.   HENT:  Mouth/Throat:  Oropharynx is clear and moist.  Neck: Neck supple.  Cardiovascular: Normal rate.   Pulmonary/Chest: Effort normal and breath sounds normal. No respiratory distress. He has no wheezes. He has no rales.  Abdominal: Soft. There is no tenderness.  Musculoskeletal: He exhibits no edema.  Neurological: He is alert.          Assessment & Plan:  #1 Urine incontinence. Suspect largely urgency with possible functional component as well. Continue Myrbetriq.  We have strongly advise they gradually taper down and get him off caffeine. Avoid anti-cholinergics with his dementia  #2 diastolic heart failure.  Appears to be stable.  No significant peripheral edema.  #3 Advanced dementia.  On Namenda and Aricept.  Continue close follow up with primary.    Eulas Post MD [facility]

## 2015-09-12 NOTE — Progress Notes (Signed)
Pre visit review using our clinic review tool, if applicable. No additional management support is needed unless otherwise documented below in the visit note. 

## 2015-11-11 ENCOUNTER — Ambulatory Visit: Payer: Medicare Other | Admitting: Adult Health

## 2015-11-11 ENCOUNTER — Encounter: Payer: Self-pay | Admitting: Adult Health

## 2015-11-11 ENCOUNTER — Ambulatory Visit (INDEPENDENT_AMBULATORY_CARE_PROVIDER_SITE_OTHER): Payer: Medicare Other | Admitting: Adult Health

## 2015-11-11 VITALS — BP 100/58 | Temp 98.1°F | Ht 75.0 in

## 2015-11-11 DIAGNOSIS — S0990XA Unspecified injury of head, initial encounter: Secondary | ICD-10-CM | POA: Diagnosis not present

## 2015-11-11 DIAGNOSIS — R296 Repeated falls: Secondary | ICD-10-CM

## 2015-11-11 NOTE — Patient Instructions (Addendum)
It was great seeing you today  I have set up home health to come and and help with gait training.   I would like you to use your walker at all times  I will follow up with you regarding your imaging.   Please let me know if you need anything

## 2015-11-11 NOTE — Progress Notes (Signed)
   Subjective:    Patient ID: [name], male    DOB: 02-Apr-1927, 82 y.o.   MRN: [id number]  HPI  82 year old male who presents to the clinic today s/p mechanical fall hitting his right eye and right forehead three days ago. His son is with him at this visit. He believes that his father hit his head on a wooden bed post. He also reports that over the last week and a half [name] has fallen one other time. At that time he was able to slowly sit down on the floor. Family denies any changes in mental status beyond baseline. He is not complaining of any acute pain at this time   He has been using his walker when he is ambulating around the home.  He denies any blurred vision.   Is only ASA 81 mg     Review of Systems  Constitutional: Negative.   Respiratory: Negative.   Cardiovascular: Negative.   Psychiatric/Behavioral: Positive for confusion (dementia related) and decreased concentration (d).  All other systems reviewed and are negative.      Objective:   Physical Exam  Constitutional: He is oriented to person, place, and time. He appears well-developed and well-nourished. No distress.  HENT:  Head: Normocephalic and atraumatic.  Right Ear: External ear normal.  Left Ear: External ear normal.  Nose: Nose normal.  Mouth/Throat: Oropharynx is clear and moist. No oropharyngeal exudate.  Eyes: Conjunctivae and EOM are normal. Pupils are equal, round, and reactive to light. Right eye exhibits no discharge. Left eye exhibits no discharge. No scleral icterus.  Cardiovascular: Normal rate, regular rhythm, normal heart sounds and intact distal pulses.  Exam reveals no gallop and no friction rub.   No murmur heard. Pulmonary/Chest: Effort normal and breath sounds normal. No respiratory distress. He has no wheezes. He has no rales. He exhibits no tenderness.  Musculoskeletal: He exhibits no edema, tenderness or deformity.  Neurological: He is alert and oriented to person, place, and time.    Skin: Skin is warm and dry. He is not diaphoretic.  Hematoma to right forehead. Ecchymosis noted around orbit of right eye and cheek. No pain or crepitus with palpation. No deformity   Psychiatric: He has a normal mood and affect. His behavior is normal. Judgment and thought content normal.  Nursing note and vitals reviewed.      Assessment & Plan:  1. Head injury, initial encounter - Will get CT of head to r/o bleeding ro orbit fracture - CT Head Wo Contrast; Future - Go to the ER with any changes in mental status  2. Multiple falls - Ambulatory referral to [facility] for gait training and strengthening exercises - Use walker at all times.   [name], NP

## 2015-11-12 ENCOUNTER — Other Ambulatory Visit: Payer: Self-pay | Admitting: Adult Health

## 2015-11-12 ENCOUNTER — Telehealth: Payer: Self-pay | Admitting: Adult Health

## 2015-11-12 ENCOUNTER — Ambulatory Visit (INDEPENDENT_AMBULATORY_CARE_PROVIDER_SITE_OTHER)
Admission: RE | Admit: 2015-11-12 | Discharge: 2015-11-12 | Disposition: A | Discharge status: Home / Self Care | Payer: Medicare Other | Source: Ambulatory Visit | Attending: Adult Health | Admitting: Adult Health

## 2015-11-12 DIAGNOSIS — R296 Repeated falls: Secondary | ICD-10-CM | POA: Diagnosis not present

## 2015-11-12 DIAGNOSIS — S0990XA Unspecified injury of head, initial encounter: Secondary | ICD-10-CM

## 2015-11-12 NOTE — Telephone Encounter (Signed)
Orders faxed

## 2015-11-12 NOTE — Telephone Encounter (Signed)
Pt had a fall last week. Since Mark with [facility] is already seeing the wife, mark would like home health PT orders to see him as well.  Will need order faxed to: [phone]   [facility] home health has called the pt and they are going to tell them not to come. [facility] will be their agency.

## 2015-11-12 NOTE — Telephone Encounter (Signed)
See below

## 2015-11-12 NOTE — Telephone Encounter (Signed)
Updated family on results of CT

## 2015-11-12 NOTE — Telephone Encounter (Signed)
I actually put an order in for this during yesterdays visit. Can we fax those orders?

## 2015-11-13 NOTE — Telephone Encounter (Signed)
Wife states [facility] called and is coming out today.  She tried to get in touch with them to advise them not to come, but could not reach anyone. Can we cancel that request to [facility] and continue with [facility]?

## 2015-11-14 DIAGNOSIS — Z8551 Personal history of malignant neoplasm of bladder: Secondary | ICD-10-CM | POA: Diagnosis not present

## 2015-11-14 DIAGNOSIS — E119 Type 2 diabetes mellitus without complications: Secondary | ICD-10-CM | POA: Diagnosis not present

## 2015-11-14 DIAGNOSIS — M6281 Muscle weakness (generalized): Secondary | ICD-10-CM | POA: Diagnosis not present

## 2015-11-14 DIAGNOSIS — R32 Unspecified urinary incontinence: Secondary | ICD-10-CM | POA: Diagnosis not present

## 2015-11-14 DIAGNOSIS — F0390 Unspecified dementia without behavioral disturbance: Secondary | ICD-10-CM | POA: Diagnosis not present

## 2015-11-14 DIAGNOSIS — Z7982 Long term (current) use of aspirin: Secondary | ICD-10-CM | POA: Diagnosis not present

## 2015-11-14 DIAGNOSIS — R296 Repeated falls: Secondary | ICD-10-CM | POA: Diagnosis not present

## 2015-11-14 DIAGNOSIS — Z9181 History of falling: Secondary | ICD-10-CM | POA: Diagnosis not present

## 2015-11-14 DIAGNOSIS — M542 Cervicalgia: Secondary | ICD-10-CM | POA: Diagnosis not present

## 2015-11-14 DIAGNOSIS — Z9981 Dependence on supplemental oxygen: Secondary | ICD-10-CM | POA: Diagnosis not present

## 2015-11-14 DIAGNOSIS — I4891 Unspecified atrial fibrillation: Secondary | ICD-10-CM | POA: Diagnosis not present

## 2015-11-17 DIAGNOSIS — M6281 Muscle weakness (generalized): Secondary | ICD-10-CM | POA: Diagnosis not present

## 2015-11-17 DIAGNOSIS — M542 Cervicalgia: Secondary | ICD-10-CM | POA: Diagnosis not present

## 2015-11-17 DIAGNOSIS — E119 Type 2 diabetes mellitus without complications: Secondary | ICD-10-CM | POA: Diagnosis not present

## 2015-11-17 DIAGNOSIS — F0390 Unspecified dementia without behavioral disturbance: Secondary | ICD-10-CM | POA: Diagnosis not present

## 2015-11-17 DIAGNOSIS — R296 Repeated falls: Secondary | ICD-10-CM | POA: Diagnosis not present

## 2015-11-17 DIAGNOSIS — I4891 Unspecified atrial fibrillation: Secondary | ICD-10-CM | POA: Diagnosis not present

## 2015-11-18 ENCOUNTER — Telehealth: Payer: Self-pay | Admitting: Adult Health

## 2015-11-18 NOTE — Telephone Encounter (Signed)
° °  Mark with [facility] call to ask for verbal orders  For physical therapy 2 times a week for 9 weeks    [phone]

## 2015-11-18 NOTE — Telephone Encounter (Signed)
Ok to give verbal orders?

## 2015-11-18 NOTE — Telephone Encounter (Signed)
Mark notified.

## 2015-11-19 DIAGNOSIS — I4891 Unspecified atrial fibrillation: Secondary | ICD-10-CM | POA: Diagnosis not present

## 2015-11-19 DIAGNOSIS — R296 Repeated falls: Secondary | ICD-10-CM | POA: Diagnosis not present

## 2015-11-19 DIAGNOSIS — M6281 Muscle weakness (generalized): Secondary | ICD-10-CM | POA: Diagnosis not present

## 2015-11-19 DIAGNOSIS — F0390 Unspecified dementia without behavioral disturbance: Secondary | ICD-10-CM | POA: Diagnosis not present

## 2015-11-19 DIAGNOSIS — M542 Cervicalgia: Secondary | ICD-10-CM | POA: Diagnosis not present

## 2015-11-19 DIAGNOSIS — E119 Type 2 diabetes mellitus without complications: Secondary | ICD-10-CM | POA: Diagnosis not present

## 2015-11-25 DIAGNOSIS — E119 Type 2 diabetes mellitus without complications: Secondary | ICD-10-CM | POA: Diagnosis not present

## 2015-11-25 DIAGNOSIS — F0390 Unspecified dementia without behavioral disturbance: Secondary | ICD-10-CM | POA: Diagnosis not present

## 2015-11-25 DIAGNOSIS — M6281 Muscle weakness (generalized): Secondary | ICD-10-CM | POA: Diagnosis not present

## 2015-11-25 DIAGNOSIS — R296 Repeated falls: Secondary | ICD-10-CM | POA: Diagnosis not present

## 2015-11-25 DIAGNOSIS — M542 Cervicalgia: Secondary | ICD-10-CM | POA: Diagnosis not present

## 2015-11-25 DIAGNOSIS — I4891 Unspecified atrial fibrillation: Secondary | ICD-10-CM | POA: Diagnosis not present

## 2015-11-27 DIAGNOSIS — E119 Type 2 diabetes mellitus without complications: Secondary | ICD-10-CM | POA: Diagnosis not present

## 2015-11-27 DIAGNOSIS — M542 Cervicalgia: Secondary | ICD-10-CM | POA: Diagnosis not present

## 2015-11-27 DIAGNOSIS — R296 Repeated falls: Secondary | ICD-10-CM | POA: Diagnosis not present

## 2015-11-27 DIAGNOSIS — M6281 Muscle weakness (generalized): Secondary | ICD-10-CM | POA: Diagnosis not present

## 2015-11-27 DIAGNOSIS — F0390 Unspecified dementia without behavioral disturbance: Secondary | ICD-10-CM | POA: Diagnosis not present

## 2015-11-27 DIAGNOSIS — I4891 Unspecified atrial fibrillation: Secondary | ICD-10-CM | POA: Diagnosis not present

## 2015-12-02 DIAGNOSIS — M542 Cervicalgia: Secondary | ICD-10-CM | POA: Diagnosis not present

## 2015-12-02 DIAGNOSIS — M6281 Muscle weakness (generalized): Secondary | ICD-10-CM | POA: Diagnosis not present

## 2015-12-02 DIAGNOSIS — I4891 Unspecified atrial fibrillation: Secondary | ICD-10-CM | POA: Diagnosis not present

## 2015-12-02 DIAGNOSIS — R296 Repeated falls: Secondary | ICD-10-CM | POA: Diagnosis not present

## 2015-12-02 DIAGNOSIS — F0390 Unspecified dementia without behavioral disturbance: Secondary | ICD-10-CM | POA: Diagnosis not present

## 2015-12-02 DIAGNOSIS — E119 Type 2 diabetes mellitus without complications: Secondary | ICD-10-CM | POA: Diagnosis not present

## 2015-12-03 ENCOUNTER — Ambulatory Visit: Payer: Medicare Other | Admitting: Nurse Practitioner

## 2015-12-04 ENCOUNTER — Encounter: Payer: Self-pay | Admitting: Nurse Practitioner

## 2015-12-04 DIAGNOSIS — I4891 Unspecified atrial fibrillation: Secondary | ICD-10-CM | POA: Diagnosis not present

## 2015-12-04 DIAGNOSIS — M542 Cervicalgia: Secondary | ICD-10-CM | POA: Diagnosis not present

## 2015-12-04 DIAGNOSIS — M6281 Muscle weakness (generalized): Secondary | ICD-10-CM | POA: Diagnosis not present

## 2015-12-04 DIAGNOSIS — R296 Repeated falls: Secondary | ICD-10-CM | POA: Diagnosis not present

## 2015-12-04 DIAGNOSIS — F0390 Unspecified dementia without behavioral disturbance: Secondary | ICD-10-CM | POA: Diagnosis not present

## 2015-12-04 DIAGNOSIS — E119 Type 2 diabetes mellitus without complications: Secondary | ICD-10-CM | POA: Diagnosis not present

## 2015-12-10 DIAGNOSIS — F0390 Unspecified dementia without behavioral disturbance: Secondary | ICD-10-CM | POA: Diagnosis not present

## 2015-12-10 DIAGNOSIS — R296 Repeated falls: Secondary | ICD-10-CM | POA: Diagnosis not present

## 2015-12-10 DIAGNOSIS — M542 Cervicalgia: Secondary | ICD-10-CM | POA: Diagnosis not present

## 2015-12-10 DIAGNOSIS — E119 Type 2 diabetes mellitus without complications: Secondary | ICD-10-CM | POA: Diagnosis not present

## 2015-12-10 DIAGNOSIS — I4891 Unspecified atrial fibrillation: Secondary | ICD-10-CM | POA: Diagnosis not present

## 2015-12-10 DIAGNOSIS — M6281 Muscle weakness (generalized): Secondary | ICD-10-CM | POA: Diagnosis not present

## 2015-12-11 ENCOUNTER — Ambulatory Visit (INDEPENDENT_AMBULATORY_CARE_PROVIDER_SITE_OTHER): Payer: Medicare Other | Admitting: Nurse Practitioner

## 2015-12-11 ENCOUNTER — Encounter: Payer: Self-pay | Admitting: Nurse Practitioner

## 2015-12-11 VITALS — BP 110/70 | HR 73 | Ht 75.0 in | Wt 189.4 lb

## 2015-12-11 DIAGNOSIS — R269 Unspecified abnormalities of gait and mobility: Secondary | ICD-10-CM | POA: Diagnosis not present

## 2015-12-11 DIAGNOSIS — F0390 Unspecified dementia without behavioral disturbance: Secondary | ICD-10-CM

## 2015-12-11 DIAGNOSIS — F0391 Unspecified dementia with behavioral disturbance: Secondary | ICD-10-CM | POA: Diagnosis not present

## 2015-12-11 DIAGNOSIS — F03918 Unspecified dementia, unspecified severity, with other behavioral disturbance: Secondary | ICD-10-CM

## 2015-12-11 MED ORDER — DONEPEZIL HCL 23 MG PO TABS: 23.0000 mg | ORAL_TABLET | Freq: Every day | ORAL | 2 refills | Status: DC

## 2015-12-11 MED ORDER — MEMANTINE HCL 10 MG PO TABS: 10.0000 mg | ORAL_TABLET | Freq: Two times a day (BID) | ORAL | 1 refills | Status: DC

## 2015-12-11 NOTE — Progress Notes (Signed)
[facility]  PATIENT: [name] DOB: 1927/11/10   REASON FOR VISIT: Follow-up for dementia abnormality of gait HISTORY FROM: Patient and son and wife    HISTORY OF PRESENT ILLNESS: HISTORY: [name] is a 82 yo RH referred by cardiologist [name], Primary care is Dr. Timoteo Gaul, for evaluation of memory trouble, gait difficulty, he is accompanied by his wife, and daughter at today's clinical visit. He had a past medical history of chronic atrial fibrillation, was on Coumadin treatment, but developed recurrent GI bleeding, from reviewing the chart, he is not on any anticoagulation treatment, he also had past medical history of obesity, sedentary lifestyle, diabetes, Family noticed he had gradual onset memory trouble over the past few years, has been taking Aricept 10 mg every day, he also has gradual onset gait difficulty, began to use cane since 2012, he has no significant low back pain or neck pain, recent 12 months, since 2014, he has worsening gait difficulty, urinary incontinence, urgency, worsening memory trouble, he tends to repeat himself, forget familiar people's name, forgot to turn off stove, he has quit driving for a few months now, He has bilateral lower extremity edema, recent echocardiogram showed ejection fraction 60-65%, moderate mitral regurgitation, mild aortic stenosis  He went to school for 10 years, was a Armed forces operational officer, was able to retired at age 88, traveled all over the world, but has become sedentary over the past 10 years, he still enjoys playing his computer games   UPDATE May 7th 2015:I have reviewed MRI with patient and his family, MRI of brain showed moderate atrophy, mild to moderate periventricular small vessel disease. MRI of lumbar showed L4-5: pseudo-disc bulging with facet hypertrophy with moderate biforaminal foraminal stenosis, L5-S1: severe facet arthropathy with no spinal stenosis or foraminal narrowing. At L1-2, L2-3, L3-4:  disc bulging and facet hypertrophy with mild biforaminal foraminal stenosis  Laboratory showed a mild anemia, ferritin was 15, otherwise normal B12, CMP, Family reported multiple episodes of anemia, in the past, no etiology was found, he had physical therapy for 3 weeks, no significant change, he denies low back pain, does has urinary urgency, occasionally incontinence episodes. He has been taking gabapentin 300 mg every night which has helped his sleep, UPDATE Dec 03 2014: He is with his wife and son [name] at today's visit.He has worsening memory trouble,   UPDATE 06/02/15 CM [name], 82 year old male returns for follow-up with his son. He has a history of memory disorder and gait abnormality. He was last seen in this office 12/03/2014. Since that time he has had an admission for pneumonia and also congestive heart failure. He was getting some therapies in the home however that has now concluded. He has not performed home exercise program. He has some urinary incontinence related to his Lasix. Son reports that he has not had any falls and he uses walker. Appetite is reportedly good and he is sleeping well. Son reports memory is better. He returns for reevaluation UPDATE 12/11/15 CM [name], -year-old male returns for follow-up.He has a history of dementia. He continues to live at home. He has 24/ 7 caregivers.he has had 3 recent falls and he is now getting physical therapy which was ordered by his primary care. Rare hallucinations no wandering behavior. He ambulates with a walker. Appetite is good. No difficulty sleeping. Memory is stable per MMSE. He returns for reevaluation REVIEW OF SYSTEMS: Full 14 system review of systems performed and notable only for those listed, all  others are neg:  Constitutional: neg  Cardiovascular: neg Ear/Nose/Throat: neg  Skin: neg Eyes: neg Respiratory: neg Gastroitestinal: neg  Hematology/Lymphatic:easy bruising Endocrine: neg Musculoskeletal: Walking  difficulty Allergy/Immunology: neg Neurological: Memory loss Psychiatric: neg Sleep : neg   ALLERGIES: Allergies  Allergen Reactions  . Cephalexin Other (See Comments)    Unknown reaction  . Furacin [Nitrofurazone] Other (See Comments)    Unknown reaction  . Macrodantin Other (See Comments)    Unknown reaction  . Zestril [Lisinopril] Other (See Comments)    Unknown reaction    HOME MEDICATIONS: Outpatient Medications Prior to Visit  Medication Sig Dispense Refill  . aspirin 81 MG tablet Take 81 mg by mouth daily.    [name] donepezil (ARICEPT) 23 MG TABS tablet Take 1 tablet (23 mg total) by mouth at bedtime. 90 tablet 2  . ferrous sulfate 325 (65 FE) MG tablet Take 325 mg by mouth 2 (two) times daily.     . folic acid (FOLVITE) 1 MG tablet Take 1 mg by mouth 2 (two) times daily.     . furosemide (LASIX) 40 MG tablet Take two tablets by mouth every morning. Take an additional tablet by mouth in the evening on Mon, Wed and Fri 75 tablet 10  . gabapentin (NEURONTIN) 100 MG capsule Take 1 capsule (100 mg total) by mouth at bedtime. 90 capsule 3  . hydrOXYzine (ATARAX/VISTARIL) 10 MG tablet TAKE (1) TABLET TWICE A DAY AS NEEDED. 60 tablet 5  . LORazepam (ATIVAN) 0.5 MG tablet 0.5 to 1 pill as needed for agitation 30 tablet 0  . memantine (NAMENDA) 10 MG tablet Take 1 tablet (10 mg total) by mouth 2 (two) times daily. 60 tablet 6  . metoprolol tartrate (LOPRESSOR) 25 MG tablet TAKE  (1)  TABLET  FOUR TIMES DAILY. 120 tablet 5  . mirabegron ER (MYRBETRIQ) 50 MG TB24 tablet Take 1 tablet (50 mg total) by mouth daily. 90 tablet 3  . Multiple Vitamin (MULTIVITAMIN WITH MINERALS) TABS tablet Take 1 tablet by mouth daily.    . Multiple Vitamins-Minerals (PRESERVISION AREDS 2 PO) Take 1 tablet by mouth 2 (two) times daily.     . nitroGLYCERIN (NITROSTAT) 0.4 MG SL tablet Place 0.4 mg under the tongue every 5 (five) minutes as needed for chest pain (x 3 doses daily).    . triamcinolone cream  (KENALOG) 0.1 % Apply 1 application topically daily.      No facility-administered medications prior to visit.     PAST MEDICAL HISTORY: Past Medical History:  Diagnosis Date  . Anemia    iron deficiency secondary to bleeding  . Arrhythmia    a fib  . Arthritis    DJD knee - left  . Bladder cancer (HCC)    BCG [name]  . Diabetes mellitus    NIDDM on oral medication  . ED (erectile dysfunction)   . Genital warts    recurrent. Not a problem  . GERD (gastroesophageal reflux disease)    treated with PPI  . Hard of hearing   . Hematochezia    has intermittent problem  . Hypertension    mild  . Irritable bladder   . Memory disorder   . Neuropathy (HCC)    burning discomfort feet/legs  . Restless leg    treats with heat    PAST SURGICAL HISTORY: Past Surgical History:  Procedure Laterality Date  . APPENDECTOMY  1946  . CARDIAC CATHETERIZATION N/A 01/13/2015   Procedure: Pericardiocentesis;  Surgeon: Shanon Brow  Loren Racer, MD;  Location: [facility];  Service: Cardiovascular;  Laterality: N/A;  . CARDIOVASCULAR STRESS TEST  03/28/2003   EF 55%.  Normal cardiolite study  . JOINT REPLACEMENT  1990's   left TKR  . KNEE ARTHROSCOPY  1980's   right knee  . TONSILLECTOMY  1938    FAMILY HISTORY: Family History  Problem Relation Age of Onset  . Kidney disease Mother   . Heart disease Mother   . Stroke Mother   . Cancer Father     prostate  . Mental illness Brother   . Diabetes Neg Hx   . COPD Neg Hx   . Heart attack Neg Hx   . Hypertension Neg Hx     SOCIAL HISTORY: Social History   Social History  . Marital status: Married    Spouse name: [name]  . Number of children: 4  . Years of education: 10th    Occupational History  . retired Retired   Social History Main Topics  . Smoking status: Former Smoker    Quit date: 08/28/1975  . Smokeless tobacco: Former Systems developer  . Alcohol use No  . Drug use: No  . Sexual activity: Not Currently   Other Topics  Concern  . Not on file   Social History Narrative   Patient lives at home with his wife [name]) Married 1 years.   Retired   Education 10 th grade   Right handed   Caffeine one or two daily      PHYSICAL EXAM  Vitals:   12/11/15 1509  BP: 110/70  Pulse: 73  Weight: 189 lb 6.4 oz (85.9 kg)  Height: 6\' 3"  (1.905 m)   Body mass index is 23.67 kg/m. Gen: NAD, conversant, well nourised, obese, well groomed  Cardiovascular: Regular rate rhythm, no peripheral edema, Neck: Supple, no carotid bruit. Pulmonary: Clear to auscultation bilaterally   NEUROLOGICAL EXAM:  MENTAL STATUS: Speech:  Speech is normal; fluent and spontaneous with normal comprehension.  Cognition: Mini-Mental Status Examination is 14 out of 30 . He misses items in orientation calculation to 3 recallHe has difficulty copy design, write a sentence. Clock 0/4. CRANIAL NERVES: CN II: Visual fields are full to confrontation. Pupils are round equal and briskly reactive to light. CN III, IV, VI: extraocular movement are normal. No ptosis. CN V: Facial sensation is intact to pinprick in all 3 divisions bilaterally.   CN VII: Face is symmetric with normal eye closure and smile. CN VIII: Hard of hearing bilaterally  CN IX, X: Palate elevates symmetrically. Phonation is normal. CN XI: Head turning and shoulder shrug are intact CN XII: Tongue is midline with normal movements and no atrophy. MOTOR:There is no pronator drift of out-stretched arms. Muscle bulk and tone are normal. He has mild to moderate bilateral ankle dorsiflexion weakness REFLEXES:Hyperreflexia of bilateral lower extremities. Plantar responses are flexor. SENSORY: Length dependent sensory changes COORDINATION:Rapid alternating movements and fine finger movements are intact. There is no dysmetria on finger-to-nose and heel-knee-shin.  GAIT/STANCE:He needs help to get up from seated position,with help  cautious, steady gait  with   his walker   DIAGNOSTIC DATA (LABS, IMAGING, TESTING) - I reviewed patient records, labs, notes, testing and imaging myself where available.  Lab Results  Component Value Date   WBC 4.1 08/18/2015   HGB 12.3 (L) 08/18/2015   HCT 38.6 (L) 08/18/2015   MCV 90.2 08/18/2015   PLT 167 08/18/2015      Component Value Date/Time  NA 135 08/18/2015 1729   K 4.0 08/18/2015 1729   CL 98 (L) 08/18/2015 1729   CO2 29 08/18/2015 1729   GLUCOSE 130 (H) 08/18/2015 1729   BUN 16 08/18/2015 1729   CREATININE 0.73 08/18/2015 1729   CREATININE 0.74 03/20/2015 1259   CALCIUM 8.7 (L) 08/18/2015 1729   PROT 6.4 (L) 08/18/2015 1729   ALBUMIN 3.5 08/18/2015 1729   AST 18 08/18/2015 1729   ALT 12 (L) 08/18/2015 1729   ALKPHOS 80 08/18/2015 1729   BILITOT 0.6 08/18/2015 1729   GFRNONAA >60 08/18/2015 1729   GFRAA >60 08/18/2015 1729   Lab Results  Component Value Date   CHOL 125 03/20/2015   HDL 48 03/20/2015   LDLCALC 64 03/20/2015   TRIG 66 03/20/2015   CHOLHDL 2.6 03/20/2015     Lab Results  Component Value Date   TSH 2.449 01/15/2015      ASSESSMENT AND PLAN  82 y.o. year old male  has a past medical history Dementia, Mini-Mental Status exam today 14 out of 30. Last 13/30. Gait abnormality multifactorial due to lumbar stenosis peripheral neuropathy and deconditioning. Recent falls now receiving PT.   PLAN: Will continue  Aricept  Continue  Namenda   Continue to use walker at all times to prevent falls Continue physical therapy F/U in 6 months next with Dr. Luan Pulling, [facility], [facility], [facility] [address], [place], [place] [address] [phone]

## 2015-12-11 NOTE — Patient Instructions (Addendum)
Will continue  Aricept  Continue  Namenda   Continue to use walker at all times to prevent falls Continue physical therapy F/U in 6 months next with Dr. Krista Blue

## 2015-12-12 DIAGNOSIS — M542 Cervicalgia: Secondary | ICD-10-CM | POA: Diagnosis not present

## 2015-12-12 DIAGNOSIS — E119 Type 2 diabetes mellitus without complications: Secondary | ICD-10-CM | POA: Diagnosis not present

## 2015-12-12 DIAGNOSIS — M6281 Muscle weakness (generalized): Secondary | ICD-10-CM | POA: Diagnosis not present

## 2015-12-12 DIAGNOSIS — F0390 Unspecified dementia without behavioral disturbance: Secondary | ICD-10-CM | POA: Diagnosis not present

## 2015-12-12 DIAGNOSIS — R296 Repeated falls: Secondary | ICD-10-CM | POA: Diagnosis not present

## 2015-12-12 DIAGNOSIS — I4891 Unspecified atrial fibrillation: Secondary | ICD-10-CM | POA: Diagnosis not present

## 2015-12-17 DIAGNOSIS — E119 Type 2 diabetes mellitus without complications: Secondary | ICD-10-CM | POA: Diagnosis not present

## 2015-12-17 DIAGNOSIS — M6281 Muscle weakness (generalized): Secondary | ICD-10-CM | POA: Diagnosis not present

## 2015-12-17 DIAGNOSIS — I4891 Unspecified atrial fibrillation: Secondary | ICD-10-CM | POA: Diagnosis not present

## 2015-12-17 DIAGNOSIS — F0390 Unspecified dementia without behavioral disturbance: Secondary | ICD-10-CM | POA: Diagnosis not present

## 2015-12-17 DIAGNOSIS — M542 Cervicalgia: Secondary | ICD-10-CM | POA: Diagnosis not present

## 2015-12-17 DIAGNOSIS — R296 Repeated falls: Secondary | ICD-10-CM | POA: Diagnosis not present

## 2015-12-17 NOTE — Progress Notes (Signed)
I have reviewed and agreed above plan. 

## 2015-12-19 DIAGNOSIS — R296 Repeated falls: Secondary | ICD-10-CM | POA: Diagnosis not present

## 2015-12-19 DIAGNOSIS — M6281 Muscle weakness (generalized): Secondary | ICD-10-CM | POA: Diagnosis not present

## 2015-12-19 DIAGNOSIS — F0390 Unspecified dementia without behavioral disturbance: Secondary | ICD-10-CM | POA: Diagnosis not present

## 2015-12-19 DIAGNOSIS — E119 Type 2 diabetes mellitus without complications: Secondary | ICD-10-CM | POA: Diagnosis not present

## 2015-12-19 DIAGNOSIS — M542 Cervicalgia: Secondary | ICD-10-CM | POA: Diagnosis not present

## 2015-12-19 DIAGNOSIS — I4891 Unspecified atrial fibrillation: Secondary | ICD-10-CM | POA: Diagnosis not present

## 2015-12-22 DIAGNOSIS — M542 Cervicalgia: Secondary | ICD-10-CM | POA: Diagnosis not present

## 2015-12-22 DIAGNOSIS — E119 Type 2 diabetes mellitus without complications: Secondary | ICD-10-CM | POA: Diagnosis not present

## 2015-12-22 DIAGNOSIS — R296 Repeated falls: Secondary | ICD-10-CM | POA: Diagnosis not present

## 2015-12-22 DIAGNOSIS — I4891 Unspecified atrial fibrillation: Secondary | ICD-10-CM | POA: Diagnosis not present

## 2015-12-22 DIAGNOSIS — F0390 Unspecified dementia without behavioral disturbance: Secondary | ICD-10-CM | POA: Diagnosis not present

## 2015-12-22 DIAGNOSIS — M6281 Muscle weakness (generalized): Secondary | ICD-10-CM | POA: Diagnosis not present

## 2015-12-24 DIAGNOSIS — E119 Type 2 diabetes mellitus without complications: Secondary | ICD-10-CM | POA: Diagnosis not present

## 2015-12-24 DIAGNOSIS — F0390 Unspecified dementia without behavioral disturbance: Secondary | ICD-10-CM | POA: Diagnosis not present

## 2015-12-24 DIAGNOSIS — M542 Cervicalgia: Secondary | ICD-10-CM | POA: Diagnosis not present

## 2015-12-24 DIAGNOSIS — I4891 Unspecified atrial fibrillation: Secondary | ICD-10-CM | POA: Diagnosis not present

## 2015-12-24 DIAGNOSIS — M6281 Muscle weakness (generalized): Secondary | ICD-10-CM | POA: Diagnosis not present

## 2015-12-24 DIAGNOSIS — R296 Repeated falls: Secondary | ICD-10-CM | POA: Diagnosis not present

## 2015-12-29 DIAGNOSIS — M6281 Muscle weakness (generalized): Secondary | ICD-10-CM | POA: Diagnosis not present

## 2015-12-29 DIAGNOSIS — F0390 Unspecified dementia without behavioral disturbance: Secondary | ICD-10-CM | POA: Diagnosis not present

## 2015-12-29 DIAGNOSIS — R296 Repeated falls: Secondary | ICD-10-CM | POA: Diagnosis not present

## 2015-12-29 DIAGNOSIS — I4891 Unspecified atrial fibrillation: Secondary | ICD-10-CM | POA: Diagnosis not present

## 2015-12-29 DIAGNOSIS — M542 Cervicalgia: Secondary | ICD-10-CM | POA: Diagnosis not present

## 2015-12-29 DIAGNOSIS — E119 Type 2 diabetes mellitus without complications: Secondary | ICD-10-CM | POA: Diagnosis not present

## 2015-12-31 DIAGNOSIS — M6281 Muscle weakness (generalized): Secondary | ICD-10-CM | POA: Diagnosis not present

## 2015-12-31 DIAGNOSIS — I4891 Unspecified atrial fibrillation: Secondary | ICD-10-CM | POA: Diagnosis not present

## 2015-12-31 DIAGNOSIS — E119 Type 2 diabetes mellitus without complications: Secondary | ICD-10-CM | POA: Diagnosis not present

## 2015-12-31 DIAGNOSIS — M542 Cervicalgia: Secondary | ICD-10-CM | POA: Diagnosis not present

## 2015-12-31 DIAGNOSIS — R296 Repeated falls: Secondary | ICD-10-CM | POA: Diagnosis not present

## 2015-12-31 DIAGNOSIS — F0390 Unspecified dementia without behavioral disturbance: Secondary | ICD-10-CM | POA: Diagnosis not present

## 2016-01-02 ENCOUNTER — Telehealth: Payer: Self-pay | Admitting: Adult Health

## 2016-01-02 NOTE — Telephone Encounter (Signed)
Son has questions about pt's  LORazepam (ATIVAN) 0.5 MG tablet  How often, how much, when and if to continue. Please call back

## 2016-01-02 NOTE — Telephone Encounter (Signed)
Spoke with patient's son and clarified directions. Patient's son verbalized understanding.

## 2016-01-02 NOTE — Telephone Encounter (Signed)
Take one half to one tab every 6-8 hours, only as needed for agitation

## 2016-01-05 DIAGNOSIS — F0390 Unspecified dementia without behavioral disturbance: Secondary | ICD-10-CM | POA: Diagnosis not present

## 2016-01-05 DIAGNOSIS — M542 Cervicalgia: Secondary | ICD-10-CM | POA: Diagnosis not present

## 2016-01-05 DIAGNOSIS — M6281 Muscle weakness (generalized): Secondary | ICD-10-CM | POA: Diagnosis not present

## 2016-01-05 DIAGNOSIS — E119 Type 2 diabetes mellitus without complications: Secondary | ICD-10-CM | POA: Diagnosis not present

## 2016-01-05 DIAGNOSIS — R296 Repeated falls: Secondary | ICD-10-CM | POA: Diagnosis not present

## 2016-01-05 DIAGNOSIS — I4891 Unspecified atrial fibrillation: Secondary | ICD-10-CM | POA: Diagnosis not present

## 2016-01-06 ENCOUNTER — Other Ambulatory Visit: Payer: Self-pay

## 2016-01-06 ENCOUNTER — Encounter: Payer: Self-pay | Admitting: Adult Health

## 2016-01-06 ENCOUNTER — Telehealth: Payer: Self-pay

## 2016-01-06 NOTE — Telephone Encounter (Signed)
Got refill request for Nitro 0.4 mg tablet from [facility] Former Dr Mare Ferrari patient who is to follow up as needed. No follow up appointments ever scheduled. Called patient and was told they would ask PCP to refill Nitro because they did not need an appointment with cardiologist.

## 2016-01-07 ENCOUNTER — Ambulatory Visit (INDEPENDENT_AMBULATORY_CARE_PROVIDER_SITE_OTHER): Payer: Medicare Other | Admitting: Adult Health

## 2016-01-07 ENCOUNTER — Encounter: Payer: Self-pay | Admitting: Adult Health

## 2016-01-07 VITALS — BP 124/64 | Ht 75.0 in | Wt 187.4 lb

## 2016-01-07 DIAGNOSIS — Z23 Encounter for immunization: Secondary | ICD-10-CM | POA: Diagnosis not present

## 2016-01-07 DIAGNOSIS — G309 Alzheimer's disease, unspecified: Secondary | ICD-10-CM

## 2016-01-07 DIAGNOSIS — F0281 Dementia in other diseases classified elsewhere with behavioral disturbance: Secondary | ICD-10-CM | POA: Diagnosis not present

## 2016-01-07 DIAGNOSIS — I5022 Chronic systolic (congestive) heart failure: Secondary | ICD-10-CM | POA: Diagnosis not present

## 2016-01-07 DIAGNOSIS — G308 Other Alzheimer's disease: Secondary | ICD-10-CM | POA: Diagnosis not present

## 2016-01-07 MED ORDER — LORAZEPAM 0.5 MG PO TABS: 0.5000 mg | ORAL_TABLET | Freq: Two times a day (BID) | ORAL | 0 refills | Status: DC | PRN

## 2016-01-07 MED ORDER — NITROGLYCERIN 0.4 MG SL SUBL: 0.4000 mg | SUBLINGUAL_TABLET | SUBLINGUAL | 3 refills | Status: AC | PRN

## 2016-01-07 NOTE — Progress Notes (Signed)
Subjective:    Patient ID: [name], male    DOB: Nov 07, 1927, 82 y.o.   MRN: [id number]  HPI  82 year old male who  has a past medical history of Anemia; Arrhythmia; Arthritis; Bladder cancer ([place]); Diabetes mellitus; ED (erectile dysfunction); Genital warts; GERD (gastroesophageal reflux disease); Hard of hearing; Hematochezia; Hypertension; Irritable bladder; Memory disorder; Neuropathy ([place]); and Restless leg.  He presents to the clinic today with his [name] who helps provide history. [name] needs to have his home oxygen re-certified. His son reports that he continues to use home oxygen regularly when at home. Today in the office he is not using any oxygen.   [name] denies any shortness of breath but does endorse that when he is home he has episodes when he is walking around his home that he feels short of breath.   His son also reports that [name] has been having episodes of increasing confusion. The main example he can give me is that he keeps asking " where is my wife? " and telling family members that they need to call her on the phone. His wife [name] ( who he thinks is a cousin) lives at home with him. He also mentioned to his family the other day that he belongs to a " chain gang". [name] also reports that his father has episodes of angry outbursts. He feels as though this is controlled when he takes 0.5 mg Ativan daily. His son has started giving him a tab of Ativan in the morning and one at night before he goes to bed and feels as though this is helped tremendously. [name] reports that his father does not seem " out of it" or has an increased somnolence while taking the extra dose of ativan   Review of Systems  Constitutional: Negative.   Respiratory: Positive for shortness of breath.   Cardiovascular: Negative.   Genitourinary: Negative.   Skin: Negative.   Psychiatric/Behavioral: Positive for agitation, behavioral problems, confusion, decreased concentration and sleep disturbance.    All other systems reviewed and are negative.  Past Medical History:  Diagnosis Date  . Anemia    iron deficiency secondary to bleeding  . Arrhythmia    a fib  . Arthritis    DJD knee - left  . Bladder cancer (HCC)    BCG [name]  . Diabetes mellitus    NIDDM on oral medication  . ED (erectile dysfunction)   . Genital warts    recurrent. Not a problem  . GERD (gastroesophageal reflux disease)    treated with PPI  . Hard of hearing   . Hematochezia    has intermittent problem  . Hypertension    mild  . Irritable bladder   . Memory disorder   . Neuropathy (HCC)    burning discomfort feet/legs  . Restless leg    treats with heat    Social History   Social History  . Marital status: Married    Spouse name: [name]  . Number of children: 4  . Years of education: 10th    Occupational History  . retired Retired   Social History Main Topics  . Smoking status: Former Smoker    Quit date: 08/28/1975  . Smokeless tobacco: Former Systems developer  . Alcohol use No  . Drug use: No  . Sexual activity: Not Currently   Other Topics Concern  . Not on file   Social History Narrative   Patient lives at home with his  wife [name]) Married 58 years.   Retired   Education 10 th grade   Right handed   Caffeine one or two daily     Past Surgical History:  Procedure Laterality Date  . APPENDECTOMY  1946  . CARDIAC CATHETERIZATION N/A 01/13/2015   Procedure: Pericardiocentesis;  Surgeon: Leonie Man, MD;  Location: [facility];  Service: Cardiovascular;  Laterality: N/A;  . CARDIOVASCULAR STRESS TEST  03/28/2003   EF 55%.  Normal cardiolite study  . JOINT REPLACEMENT  1990's   left TKR  . KNEE ARTHROSCOPY  1980's   right knee  . TONSILLECTOMY  1938    Family History  Problem Relation Age of Onset  . Kidney disease Mother   . Heart disease Mother   . Stroke Mother   . Cancer Father     prostate  . Mental illness Brother   . Diabetes Neg Hx   . COPD Neg Hx   .  Heart attack Neg Hx   . Hypertension Neg Hx     Allergies  Allergen Reactions  . Cephalexin Other (See Comments)    Unknown reaction  . Furacin [Nitrofurazone] Other (See Comments)    Unknown reaction  . Macrodantin Other (See Comments)    Unknown reaction  . Zestril [Lisinopril] Other (See Comments)    Unknown reaction    Current Outpatient Prescriptions on File Prior to Visit  Medication Sig Dispense Refill  . aspirin 81 MG tablet Take 81 mg by mouth daily.    [name] donepezil (ARICEPT) 23 MG TABS tablet Take 1 tablet (23 mg total) by mouth at bedtime. 90 tablet 2  . ferrous sulfate 325 (65 FE) MG tablet Take 325 mg by mouth 2 (two) times daily.     . folic acid (FOLVITE) 1 MG tablet Take 1 mg by mouth 2 (two) times daily.     . furosemide (LASIX) 40 MG tablet Take two tablets by mouth every morning. Take an additional tablet by mouth in the evening on Mon, Wed and Fri 75 tablet 10  . gabapentin (NEURONTIN) 100 MG capsule Take 1 capsule (100 mg total) by mouth at bedtime. 90 capsule 3  . hydrOXYzine (ATARAX/VISTARIL) 10 MG tablet TAKE (1) TABLET TWICE A DAY AS NEEDED. 60 tablet 5  . memantine (NAMENDA) 10 MG tablet Take 1 tablet (10 mg total) by mouth 2 (two) times daily. 180 tablet 1  . metoprolol tartrate (LOPRESSOR) 25 MG tablet TAKE  (1)  TABLET  FOUR TIMES DAILY. 120 tablet 5  . mirabegron ER (MYRBETRIQ) 50 MG TB24 tablet Take 1 tablet (50 mg total) by mouth daily. 90 tablet 3  . Multiple Vitamin (MULTIVITAMIN WITH MINERALS) TABS tablet Take 1 tablet by mouth daily.    . Multiple Vitamins-Minerals (PRESERVISION AREDS 2 PO) Take 1 tablet by mouth 2 (two) times daily.     [name] triamcinolone cream (KENALOG) 0.1 % Apply 1 application topically daily.      No current facility-administered medications on file prior to visit.     BP 124/64   Ht 6\' 3"  (1.905 m)   Wt 187 lb 6.4 oz (85 kg)   BMI 23.42 kg/m       Objective:   Physical Exam  Constitutional: He is oriented to person,  place, and time. He appears well-developed and well-nourished. No distress.  Cardiovascular: Normal rate, regular rhythm, normal heart sounds and intact distal pulses.  Exam reveals no gallop and no friction rub.   No murmur  heard. Pulmonary/Chest: Effort normal and breath sounds normal. No respiratory distress. He has no wheezes. He has no rales. He exhibits no tenderness.  Musculoskeletal:  Used a rolling walker in the office today. He had slow shuffling gait  Neurological: He is alert and oriented to person, place, and time. He has normal reflexes. He displays normal reflexes. No cranial nerve deficit. He exhibits normal muscle tone. Coordination normal.  Skin: Skin is warm and dry. No rash noted. He is not diaphoretic. No erythema. No pallor.  Psychiatric: He has a normal mood and affect. His behavior is normal. Judgment and thought content normal.  Nursing note and vitals reviewed.     Assessment & Plan:  1. Chronic systolic congestive heart failure (HCC) - Oxygen saturation on RA at rest was 92%. His oxygen saturation dropped to 84% while walking. He as able to regain an oxygen saturation of 92% with rest on RA. While on 2 L via [place] at rest his sats increased to 96% and while walking they stayed steady at 94%.  [name] will bring back re certification paperwork that was sent it him.   2. Alzheimer's dementia with behavioral disturbance, unspecified timing of dementia onset - Will increase dose to BID PRN for agitation  - LORazepam (ATIVAN) 0.5 MG tablet; Take 1 tablet (0.5 mg total) by mouth 2 (two) times daily as needed for anxiety.  Dispense: 60 tablet; Refill: 0  3. Need for prophylactic vaccination and inoculation against influenza - Flu vaccine HIGH DOSE PF (Fluzone High dose)   [name], NP

## 2016-01-08 ENCOUNTER — Telehealth: Payer: Self-pay | Admitting: Adult Health

## 2016-01-08 DIAGNOSIS — M542 Cervicalgia: Secondary | ICD-10-CM | POA: Diagnosis not present

## 2016-01-08 DIAGNOSIS — E119 Type 2 diabetes mellitus without complications: Secondary | ICD-10-CM | POA: Diagnosis not present

## 2016-01-08 DIAGNOSIS — M6281 Muscle weakness (generalized): Secondary | ICD-10-CM | POA: Diagnosis not present

## 2016-01-08 DIAGNOSIS — R296 Repeated falls: Secondary | ICD-10-CM | POA: Diagnosis not present

## 2016-01-08 DIAGNOSIS — F0390 Unspecified dementia without behavioral disturbance: Secondary | ICD-10-CM | POA: Diagnosis not present

## 2016-01-08 DIAGNOSIS — I4891 Unspecified atrial fibrillation: Secondary | ICD-10-CM | POA: Diagnosis not present

## 2016-01-08 NOTE — Telephone Encounter (Signed)
Pt fell yesterday and Elta [place] needs to continue home health PT Verbal please  2 wk /  4

## 2016-01-08 NOTE — Telephone Encounter (Signed)
Is this ok?

## 2016-01-08 NOTE — Telephone Encounter (Signed)
Mark notified.

## 2016-01-08 NOTE — Telephone Encounter (Signed)
Ok for verbal orders ?

## 2016-01-13 DIAGNOSIS — Z9181 History of falling: Secondary | ICD-10-CM | POA: Diagnosis not present

## 2016-01-13 DIAGNOSIS — R32 Unspecified urinary incontinence: Secondary | ICD-10-CM | POA: Diagnosis not present

## 2016-01-13 DIAGNOSIS — I4891 Unspecified atrial fibrillation: Secondary | ICD-10-CM | POA: Diagnosis not present

## 2016-01-13 DIAGNOSIS — Z7982 Long term (current) use of aspirin: Secondary | ICD-10-CM | POA: Diagnosis not present

## 2016-01-13 DIAGNOSIS — R296 Repeated falls: Secondary | ICD-10-CM | POA: Diagnosis not present

## 2016-01-13 DIAGNOSIS — Z8551 Personal history of malignant neoplasm of bladder: Secondary | ICD-10-CM | POA: Diagnosis not present

## 2016-01-13 DIAGNOSIS — M542 Cervicalgia: Secondary | ICD-10-CM | POA: Diagnosis not present

## 2016-01-13 DIAGNOSIS — Z9981 Dependence on supplemental oxygen: Secondary | ICD-10-CM | POA: Diagnosis not present

## 2016-01-13 DIAGNOSIS — R2689 Other abnormalities of gait and mobility: Secondary | ICD-10-CM | POA: Diagnosis not present

## 2016-01-13 DIAGNOSIS — F0390 Unspecified dementia without behavioral disturbance: Secondary | ICD-10-CM | POA: Diagnosis not present

## 2016-01-13 DIAGNOSIS — E119 Type 2 diabetes mellitus without complications: Secondary | ICD-10-CM | POA: Diagnosis not present

## 2016-01-14 DIAGNOSIS — F0390 Unspecified dementia without behavioral disturbance: Secondary | ICD-10-CM | POA: Diagnosis not present

## 2016-01-14 DIAGNOSIS — E119 Type 2 diabetes mellitus without complications: Secondary | ICD-10-CM | POA: Diagnosis not present

## 2016-01-14 DIAGNOSIS — I4891 Unspecified atrial fibrillation: Secondary | ICD-10-CM | POA: Diagnosis not present

## 2016-01-14 DIAGNOSIS — R296 Repeated falls: Secondary | ICD-10-CM | POA: Diagnosis not present

## 2016-01-14 DIAGNOSIS — M542 Cervicalgia: Secondary | ICD-10-CM | POA: Diagnosis not present

## 2016-01-14 DIAGNOSIS — R2689 Other abnormalities of gait and mobility: Secondary | ICD-10-CM | POA: Diagnosis not present

## 2016-01-15 DIAGNOSIS — R2689 Other abnormalities of gait and mobility: Secondary | ICD-10-CM | POA: Diagnosis not present

## 2016-01-15 DIAGNOSIS — M542 Cervicalgia: Secondary | ICD-10-CM | POA: Diagnosis not present

## 2016-01-15 DIAGNOSIS — F0390 Unspecified dementia without behavioral disturbance: Secondary | ICD-10-CM | POA: Diagnosis not present

## 2016-01-15 DIAGNOSIS — I4891 Unspecified atrial fibrillation: Secondary | ICD-10-CM | POA: Diagnosis not present

## 2016-01-15 DIAGNOSIS — R296 Repeated falls: Secondary | ICD-10-CM | POA: Diagnosis not present

## 2016-01-15 DIAGNOSIS — E119 Type 2 diabetes mellitus without complications: Secondary | ICD-10-CM | POA: Diagnosis not present

## 2016-01-19 DIAGNOSIS — M542 Cervicalgia: Secondary | ICD-10-CM | POA: Diagnosis not present

## 2016-01-19 DIAGNOSIS — I4891 Unspecified atrial fibrillation: Secondary | ICD-10-CM | POA: Diagnosis not present

## 2016-01-19 DIAGNOSIS — F0390 Unspecified dementia without behavioral disturbance: Secondary | ICD-10-CM | POA: Diagnosis not present

## 2016-01-19 DIAGNOSIS — E119 Type 2 diabetes mellitus without complications: Secondary | ICD-10-CM | POA: Diagnosis not present

## 2016-01-19 DIAGNOSIS — R2689 Other abnormalities of gait and mobility: Secondary | ICD-10-CM | POA: Diagnosis not present

## 2016-01-19 DIAGNOSIS — R296 Repeated falls: Secondary | ICD-10-CM | POA: Diagnosis not present

## 2016-01-20 ENCOUNTER — Telehealth: Payer: Self-pay | Admitting: Adult Health

## 2016-01-20 NOTE — Telephone Encounter (Signed)
[facility] - Client [facility] Patient Name: [name] DOB: Sep 16, 1927 Initial Comment Caller states father c/o widespread muscle spasms. Nurse Assessment Nurse: Markus Daft, RN, Sherre Poot Date/Time Eilene Ghazi Time): 01/20/2016 2:51:44 PM Confirm and document reason for call. If symptomatic, describe symptoms. You must click the next button to save text entered. ---Caller states father c/o widespread muscle spasms - arms, shoulders, legs. Not now. Weak & tired afterwards. Lasted about 20 min. and went away on its own. Has happened several times before. Taken to hospital before, and told it is part of the dementia. Unsure of recent lab results. Has the patient traveled out of the country within the last 30 days? ---Not Applicable Does the patient have any new or worsening symptoms? ---Yes Will a triage be completed? ---Yes Related visit to physician within the last 2 weeks? ---No Does the PT have any chronic conditions? (i.e. diabetes, asthma, etc.) ---Yes List chronic conditions. ---Dementia, swelling in L.E. - on Lasix Is this a behavioral health or substance abuse call? ---No Guidelines Guideline Title Affirmed Question Affirmed Notes Muscle Aches and Body Pain Diabetes mellitus or weak immune system (e.g., HIV positive, cancer chemo, splenectomy, chronic steroids) Final Disposition User Call PCP within [place], RN, [employer] reached and said that he is not on K+. RN advised this could be a good reason for the spasms. -- Tommi Rumps - please call son. Disagree/Comply: Comply

## 2016-01-20 NOTE — Telephone Encounter (Signed)
Spoke with pt's wife and she confirmed pt is c/o muscle spasms. Pt scheduled to see [facility] tomorrow @ 2pm. Nothing further needed at this time.

## 2016-01-21 ENCOUNTER — Encounter: Payer: Self-pay | Admitting: Adult Health

## 2016-01-21 ENCOUNTER — Ambulatory Visit (INDEPENDENT_AMBULATORY_CARE_PROVIDER_SITE_OTHER): Payer: Medicare Other | Admitting: Adult Health

## 2016-01-21 VITALS — BP 138/78 | Temp 99.3°F | Ht 75.0 in | Wt 186.9 lb

## 2016-01-21 DIAGNOSIS — F0390 Unspecified dementia without behavioral disturbance: Secondary | ICD-10-CM | POA: Diagnosis not present

## 2016-01-21 DIAGNOSIS — M542 Cervicalgia: Secondary | ICD-10-CM | POA: Diagnosis not present

## 2016-01-21 DIAGNOSIS — R569 Unspecified convulsions: Secondary | ICD-10-CM | POA: Diagnosis not present

## 2016-01-21 DIAGNOSIS — I4891 Unspecified atrial fibrillation: Secondary | ICD-10-CM | POA: Diagnosis not present

## 2016-01-21 DIAGNOSIS — R2689 Other abnormalities of gait and mobility: Secondary | ICD-10-CM | POA: Diagnosis not present

## 2016-01-21 DIAGNOSIS — R296 Repeated falls: Secondary | ICD-10-CM | POA: Diagnosis not present

## 2016-01-21 DIAGNOSIS — E119 Type 2 diabetes mellitus without complications: Secondary | ICD-10-CM | POA: Diagnosis not present

## 2016-01-21 LAB — BASIC METABOLIC PANEL
BUN: 22 mg/dL (ref 6–23)
CO2: 32 mEq/L (ref 19–32)
Calcium: 9.5 mg/dL (ref 8.4–10.5)
Chloride: 103 mEq/L (ref 96–112)
Creatinine, Ser: 0.93 mg/dL (ref 0.40–1.50)
GFR: 81.74 mL/min (ref >60.00)
Glucose, Bld: 112 mg/dL — ABNORMAL HIGH (ref 70–99)
POTASSIUM: 3.9 meq/L (ref 3.5–5.1)
SODIUM: 142 meq/L (ref 135–145)

## 2016-01-21 LAB — CBC WITH DIFFERENTIAL/PLATELET
BASOS PCT: 0 % (ref 0.0–3.0)
Basophils Absolute: 0 10*3/uL (ref 0.0–0.1)
EOS ABS: 0 10*3/uL (ref 0.0–0.7)
Eosinophils Relative: 0.1 % (ref 0.0–5.0)
HCT: 33.7 % — ABNORMAL LOW (ref 39.0–52.0)
Hemoglobin: 11.1 g/dL — ABNORMAL LOW (ref 13.0–17.0)
LYMPHS ABS: 0.5 10*3/uL — AB (ref 0.7–4.0)
Lymphocytes Relative: 5.2 % — ABNORMAL LOW (ref 12.0–46.0)
MCHC: 32.9 g/dL (ref 30.0–36.0)
MCV: 86 fl (ref 78.0–100.0)
MONO ABS: 1.1 10*3/uL — AB (ref 0.1–1.0)
Monocytes Relative: 10.9 % (ref 3.0–12.0)
NEUTROS ABS: 8.5 10*3/uL — AB (ref 1.4–7.7)
Neutrophils Relative %: 83.8 % — ABNORMAL HIGH (ref 43.0–77.0)
PLATELETS: 126 10*3/uL — AB (ref 150.0–400.0)
RBC: 3.92 Mil/uL — ABNORMAL LOW (ref 4.22–5.81)
RDW: 17.2 % — AB (ref 11.5–15.5)
WBC: 10.1 10*3/uL (ref 4.0–10.5)

## 2016-01-21 LAB — MAGNESIUM: MAGNESIUM: 2.1 mg/dL (ref 1.5–2.5)

## 2016-01-21 NOTE — Patient Instructions (Signed)
I will follow up with you regarding your labs.   If needed and this becomes more frequent then we can go up on Gabapentin

## 2016-01-21 NOTE — Progress Notes (Signed)
Subjective:    Patient ID: [name], male    DOB: 1927-05-28, 82 y.o.   MRN: [id number]  HPI 82 year old male who presents to the office today for the acute complaint of muscle spasms. He presents with his son today. His son reports that yesterday [name] had an episode that lasted about 20 minutes of " full body shaking that was like a seizure." This happened about 24 hours ago and he has had these episodes in the past. He was able to communicate during the episode. He had no stroke like symptoms.   His son reports that [name] was fatigued after wards but has started to come back to his baseline   Review of Systems  Constitutional: Positive for activity change and fatigue.  Respiratory: Negative.   Cardiovascular: Negative.   Skin: Negative.   Neurological: Positive for seizures (?).  Psychiatric/Behavioral: Positive for confusion and decreased concentration.  All other systems reviewed and are negative.  Past Medical History:  Diagnosis Date  . Anemia    iron deficiency secondary to bleeding  . Arrhythmia    a fib  . Arthritis    DJD knee - left  . Bladder cancer (HCC)    BCG [name]  . Diabetes mellitus    NIDDM on oral medication  . ED (erectile dysfunction)   . Genital warts    recurrent. Not a problem  . GERD (gastroesophageal reflux disease)    treated with PPI  . Hard of hearing   . Hematochezia    has intermittent problem  . Hypertension    mild  . Irritable bladder   . Memory disorder   . Neuropathy (HCC)    burning discomfort feet/legs  . Restless leg    treats with heat    Social History   Social History  . Marital status: Married    Spouse name: [name]  . Number of children: 4  . Years of education: 10th    Occupational History  . retired Retired   Social History Main Topics  . Smoking status: Former Smoker    Quit date: 08/28/1975  . Smokeless tobacco: Former Systems developer  . Alcohol use No  . Drug use: No  . Sexual activity: Not Currently     Other Topics Concern  . Not on file   Social History Narrative   Patient lives at home with his wife [name]) Married 17 years.   Retired   Education 10 th grade   Right handed   Caffeine one or two daily     Past Surgical History:  Procedure Laterality Date  . APPENDECTOMY  1946  . CARDIAC CATHETERIZATION N/A 01/13/2015   Procedure: Pericardiocentesis;  Surgeon: Leonie Man, MD;  Location: [facility];  Service: Cardiovascular;  Laterality: N/A;  . CARDIOVASCULAR STRESS TEST  03/28/2003   EF 55%.  Normal cardiolite study  . JOINT REPLACEMENT  1990's   left TKR  . KNEE ARTHROSCOPY  1980's   right knee  . TONSILLECTOMY  1938    Family History  Problem Relation Age of Onset  . Kidney disease Mother   . Heart disease Mother   . Stroke Mother   . Cancer Father     prostate  . Mental illness Brother   . Diabetes Neg Hx   . COPD Neg Hx   . Heart attack Neg Hx   . Hypertension Neg Hx     Allergies  Allergen Reactions  . Cephalexin  Other (See Comments)    Unknown reaction  . Furacin [Nitrofurazone] Other (See Comments)    Unknown reaction  . Macrodantin Other (See Comments)    Unknown reaction  . Zestril [Lisinopril] Other (See Comments)    Unknown reaction    Current Outpatient Prescriptions on File Prior to Visit  Medication Sig Dispense Refill  . aspirin 81 MG tablet Take 81 mg by mouth daily.    [name] donepezil (ARICEPT) 23 MG TABS tablet Take 1 tablet (23 mg total) by mouth at bedtime. 90 tablet 2  . ferrous sulfate 325 (65 FE) MG tablet Take 325 mg by mouth 2 (two) times daily.     . folic acid (FOLVITE) 1 MG tablet Take 1 mg by mouth 2 (two) times daily.     . furosemide (LASIX) 40 MG tablet Take two tablets by mouth every morning. Take an additional tablet by mouth in the evening on Mon, Wed and Fri 75 tablet 10  . gabapentin (NEURONTIN) 100 MG capsule Take 1 capsule (100 mg total) by mouth at bedtime. 90 capsule 3  . hydrOXYzine (ATARAX/VISTARIL) 10  MG tablet TAKE (1) TABLET TWICE A DAY AS NEEDED. 60 tablet 5  . LORazepam (ATIVAN) 0.5 MG tablet Take 1 tablet (0.5 mg total) by mouth 2 (two) times daily as needed for anxiety. 60 tablet 0  . memantine (NAMENDA) 10 MG tablet Take 1 tablet (10 mg total) by mouth 2 (two) times daily. 180 tablet 1  . metoprolol tartrate (LOPRESSOR) 25 MG tablet TAKE  (1)  TABLET  FOUR TIMES DAILY. 120 tablet 5  . mirabegron ER (MYRBETRIQ) 50 MG TB24 tablet Take 1 tablet (50 mg total) by mouth daily. 90 tablet 3  . Multiple Vitamin (MULTIVITAMIN WITH MINERALS) TABS tablet Take 1 tablet by mouth daily.    . Multiple Vitamins-Minerals (PRESERVISION AREDS 2 PO) Take 1 tablet by mouth 2 (two) times daily.     . nitroGLYCERIN (NITROSTAT) 0.4 MG SL tablet Place 1 tablet (0.4 mg total) under the tongue every 5 (five) minutes as needed for chest pain (x 3 doses daily). 30 tablet 3  . triamcinolone cream (KENALOG) 0.1 % Apply 1 application topically daily.      No current facility-administered medications on file prior to visit.     BP 138/78   Temp 99.3 F (37.4 C) (Oral)   Ht 6\' 3"  (1.905 m)   Wt 186 lb 14.4 oz (84.8 kg)   BMI 23.36 kg/m       Objective:   Physical Exam  Constitutional: He is oriented to person, place, and time. He appears well-developed and well-nourished. No distress.  Cardiovascular: Normal rate, regular rhythm, normal heart sounds and intact distal pulses.  Exam reveals no gallop and no friction rub.   No murmur heard. Pulmonary/Chest: Effort normal and breath sounds normal. No respiratory distress. He has no wheezes. He has no rales. He exhibits no tenderness.  Neurological: He is alert and oriented to person, place, and time.  Skin: Skin is warm and dry. No rash noted. He is not diaphoretic. No erythema. No pallor.  Psychiatric: He has a normal mood and affect. His behavior is normal. Judgment and thought content normal.  Nursing note and vitals reviewed.     Assessment & Plan:  1.  Seizure-like activity ([place]) - Will get basic lab work. If needed and they are happening more frequent can increase Gabapentin. I think placing him on more medication has the ability to decrease cognitive function.  Informed family that with late stage of Alzheimer that some patients can have seizures.  They are ok with this plan  - CBC with Differential/Platelet - Basic metabolic panel - Magnesium  [name], NP

## 2016-01-30 ENCOUNTER — Other Ambulatory Visit: Payer: Self-pay | Admitting: Adult Health

## 2016-01-30 DIAGNOSIS — R2689 Other abnormalities of gait and mobility: Secondary | ICD-10-CM | POA: Diagnosis not present

## 2016-01-30 DIAGNOSIS — R296 Repeated falls: Secondary | ICD-10-CM | POA: Diagnosis not present

## 2016-01-30 DIAGNOSIS — M542 Cervicalgia: Secondary | ICD-10-CM | POA: Diagnosis not present

## 2016-01-30 DIAGNOSIS — F0390 Unspecified dementia without behavioral disturbance: Secondary | ICD-10-CM | POA: Diagnosis not present

## 2016-01-30 DIAGNOSIS — F0281 Dementia in other diseases classified elsewhere with behavioral disturbance: Secondary | ICD-10-CM

## 2016-01-30 DIAGNOSIS — G309 Alzheimer's disease, unspecified: Principal | ICD-10-CM

## 2016-01-30 DIAGNOSIS — E119 Type 2 diabetes mellitus without complications: Secondary | ICD-10-CM | POA: Diagnosis not present

## 2016-01-30 DIAGNOSIS — I4891 Unspecified atrial fibrillation: Secondary | ICD-10-CM | POA: Diagnosis not present

## 2016-01-30 NOTE — Telephone Encounter (Signed)
Per Tommi Rumps, ok to refill. Rx called in.

## 2016-02-02 DIAGNOSIS — M542 Cervicalgia: Secondary | ICD-10-CM | POA: Diagnosis not present

## 2016-02-02 DIAGNOSIS — I4891 Unspecified atrial fibrillation: Secondary | ICD-10-CM | POA: Diagnosis not present

## 2016-02-02 DIAGNOSIS — E119 Type 2 diabetes mellitus without complications: Secondary | ICD-10-CM | POA: Diagnosis not present

## 2016-02-02 DIAGNOSIS — R2689 Other abnormalities of gait and mobility: Secondary | ICD-10-CM | POA: Diagnosis not present

## 2016-02-02 DIAGNOSIS — F0390 Unspecified dementia without behavioral disturbance: Secondary | ICD-10-CM | POA: Diagnosis not present

## 2016-02-02 DIAGNOSIS — R296 Repeated falls: Secondary | ICD-10-CM | POA: Diagnosis not present

## 2016-02-05 ENCOUNTER — Telehealth: Payer: Self-pay | Admitting: Adult Health

## 2016-02-05 DIAGNOSIS — R2689 Other abnormalities of gait and mobility: Secondary | ICD-10-CM | POA: Diagnosis not present

## 2016-02-05 DIAGNOSIS — M542 Cervicalgia: Secondary | ICD-10-CM | POA: Diagnosis not present

## 2016-02-05 DIAGNOSIS — I4891 Unspecified atrial fibrillation: Secondary | ICD-10-CM | POA: Diagnosis not present

## 2016-02-05 DIAGNOSIS — F0390 Unspecified dementia without behavioral disturbance: Secondary | ICD-10-CM | POA: Diagnosis not present

## 2016-02-05 DIAGNOSIS — R296 Repeated falls: Secondary | ICD-10-CM | POA: Diagnosis not present

## 2016-02-05 DIAGNOSIS — E119 Type 2 diabetes mellitus without complications: Secondary | ICD-10-CM | POA: Diagnosis not present

## 2016-02-05 NOTE — Telephone Encounter (Signed)
° ° °  Mark with [facility] call to ask to continue PT for 2 times a week for 4 more weeks home PT

## 2016-02-05 NOTE — Telephone Encounter (Signed)
Is this ok?

## 2016-02-05 NOTE — Telephone Encounter (Signed)
That is fine 

## 2016-02-05 NOTE — Telephone Encounter (Signed)
Mark notified of this.

## 2016-02-08 DIAGNOSIS — I4891 Unspecified atrial fibrillation: Secondary | ICD-10-CM | POA: Diagnosis not present

## 2016-02-08 DIAGNOSIS — M542 Cervicalgia: Secondary | ICD-10-CM | POA: Diagnosis not present

## 2016-02-08 DIAGNOSIS — F0390 Unspecified dementia without behavioral disturbance: Secondary | ICD-10-CM | POA: Diagnosis not present

## 2016-02-08 DIAGNOSIS — R296 Repeated falls: Secondary | ICD-10-CM | POA: Diagnosis not present

## 2016-02-08 DIAGNOSIS — R2689 Other abnormalities of gait and mobility: Secondary | ICD-10-CM | POA: Diagnosis not present

## 2016-02-08 DIAGNOSIS — E119 Type 2 diabetes mellitus without complications: Secondary | ICD-10-CM | POA: Diagnosis not present

## 2016-02-10 DIAGNOSIS — I4891 Unspecified atrial fibrillation: Secondary | ICD-10-CM | POA: Diagnosis not present

## 2016-02-10 DIAGNOSIS — R2689 Other abnormalities of gait and mobility: Secondary | ICD-10-CM | POA: Diagnosis not present

## 2016-02-10 DIAGNOSIS — R296 Repeated falls: Secondary | ICD-10-CM | POA: Diagnosis not present

## 2016-02-10 DIAGNOSIS — F0390 Unspecified dementia without behavioral disturbance: Secondary | ICD-10-CM | POA: Diagnosis not present

## 2016-02-10 DIAGNOSIS — M542 Cervicalgia: Secondary | ICD-10-CM | POA: Diagnosis not present

## 2016-02-10 DIAGNOSIS — E119 Type 2 diabetes mellitus without complications: Secondary | ICD-10-CM | POA: Diagnosis not present

## 2016-02-16 ENCOUNTER — Other Ambulatory Visit: Payer: Self-pay | Admitting: Adult Health

## 2016-02-16 DIAGNOSIS — I4891 Unspecified atrial fibrillation: Secondary | ICD-10-CM | POA: Diagnosis not present

## 2016-02-16 DIAGNOSIS — E119 Type 2 diabetes mellitus without complications: Secondary | ICD-10-CM | POA: Diagnosis not present

## 2016-02-16 DIAGNOSIS — R296 Repeated falls: Secondary | ICD-10-CM | POA: Diagnosis not present

## 2016-02-16 DIAGNOSIS — F0390 Unspecified dementia without behavioral disturbance: Secondary | ICD-10-CM | POA: Diagnosis not present

## 2016-02-16 DIAGNOSIS — R2689 Other abnormalities of gait and mobility: Secondary | ICD-10-CM | POA: Diagnosis not present

## 2016-02-16 DIAGNOSIS — M542 Cervicalgia: Secondary | ICD-10-CM | POA: Diagnosis not present

## 2016-02-18 ENCOUNTER — Encounter: Payer: Self-pay | Admitting: Adult Health

## 2016-02-18 ENCOUNTER — Ambulatory Visit (INDEPENDENT_AMBULATORY_CARE_PROVIDER_SITE_OTHER): Payer: Medicare Other | Admitting: Adult Health

## 2016-02-18 VITALS — BP 118/58 | Temp 97.7°F

## 2016-02-18 DIAGNOSIS — R5383 Other fatigue: Secondary | ICD-10-CM

## 2016-02-18 DIAGNOSIS — E119 Type 2 diabetes mellitus without complications: Secondary | ICD-10-CM | POA: Diagnosis not present

## 2016-02-18 DIAGNOSIS — R296 Repeated falls: Secondary | ICD-10-CM | POA: Diagnosis not present

## 2016-02-18 DIAGNOSIS — M542 Cervicalgia: Secondary | ICD-10-CM | POA: Diagnosis not present

## 2016-02-18 DIAGNOSIS — I4891 Unspecified atrial fibrillation: Secondary | ICD-10-CM | POA: Diagnosis not present

## 2016-02-18 DIAGNOSIS — R2689 Other abnormalities of gait and mobility: Secondary | ICD-10-CM | POA: Diagnosis not present

## 2016-02-18 DIAGNOSIS — F0390 Unspecified dementia without behavioral disturbance: Secondary | ICD-10-CM | POA: Diagnosis not present

## 2016-02-18 NOTE — Patient Instructions (Signed)
Stop Hydroxyzine completley. If needed you can take half a pill twice a day

## 2016-02-18 NOTE — Progress Notes (Signed)
Subjective:    Patient ID: [name], male    DOB: Sep 11, 1927, 82 y.o.   MRN: [id number]  HPI  82 year old male who  has a past medical history of Anemia; Arrhythmia; Arthritis; Bladder cancer ([place]); Diabetes mellitus; ED (erectile dysfunction); Genital warts; GERD (gastroesophageal reflux disease); Hard of hearing; Hematochezia; Hypertension; Irritable bladder; Memory disorder; Neuropathy ([place]); and Restless leg. He presents with his son [name] to this visit.   [name] reports that he feels as though his father has been more fatigued over the last few weeks. He is not able to use his walker as he once did and he is not as active and playful. He reports that this has been the case in the past when he has been anemic.   There have been no medication changes  He is taking Atarax 10 mg BID PRN and Ativan 0.5 mg BID PRN  Denies any fevers, chills, n/v/d or feeling ill.   His CBC was checked one month ago and he was slightly anemic at that time. He takes OTC iron supplements.   CBC    Component Value Date/Time   WBC 10.1 01/21/2016 1445   RBC 3.92 (L) 01/21/2016 1445   HGB 11.1 (L) 01/21/2016 1445   HCT 33.7 (L) 01/21/2016 1445   PLT 126.0 (L) 01/21/2016 1445   MCV 86.0 01/21/2016 1445   MCH 28.7 08/18/2015 1729   MCHC 32.9 01/21/2016 1445   RDW 17.2 (H) 01/21/2016 1445   LYMPHSABS 0.5 (L) 01/21/2016 1445   MONOABS 1.1 (H) 01/21/2016 1445   EOSABS 0.0 01/21/2016 1445   BASOSABS 0.0 01/21/2016 1445      Review of Systems  Constitutional: Positive for activity change and fatigue. Negative for appetite change, chills, diaphoresis, fever and unexpected weight change.  Respiratory: Negative.   Cardiovascular: Negative.   Skin: Positive for pallor.  Neurological: Negative.    Past Medical History:  Diagnosis Date  . Anemia    iron deficiency secondary to bleeding  . Arrhythmia    a fib  . Arthritis    DJD knee - left  . Bladder cancer (HCC)    BCG [name]  .  Diabetes mellitus    NIDDM on oral medication  . ED (erectile dysfunction)   . Genital warts    recurrent. Not a problem  . GERD (gastroesophageal reflux disease)    treated with PPI  . Hard of hearing   . Hematochezia    has intermittent problem  . Hypertension    mild  . Irritable bladder   . Memory disorder   . Neuropathy (HCC)    burning discomfort feet/legs  . Restless leg    treats with heat    Social History   Social History  . Marital status: Married    Spouse name: [name]  . Number of children: 4  . Years of education: 10th    Occupational History  . retired Retired   Social History Main Topics  . Smoking status: Former Smoker    Quit date: 08/28/1975  . Smokeless tobacco: Former Systems developer  . Alcohol use No  . Drug use: No  . Sexual activity: Not Currently   Other Topics Concern  . Not on file   Social History Narrative   Patient lives at home with his wife [name]) Married 56 years.   Retired   Education 10 th grade   Right handed   Caffeine one or two daily  Past Surgical History:  Procedure Laterality Date  . APPENDECTOMY  1946  . CARDIAC CATHETERIZATION N/A 01/13/2015   Procedure: Pericardiocentesis;  Surgeon: Leonie Man, MD;  Location: [facility];  Service: Cardiovascular;  Laterality: N/A;  . CARDIOVASCULAR STRESS TEST  03/28/2003   EF 55%.  Normal cardiolite study  . JOINT REPLACEMENT  1990's   left TKR  . KNEE ARTHROSCOPY  1980's   right knee  . TONSILLECTOMY  1938    Family History  Problem Relation Age of Onset  . Kidney disease Mother   . Heart disease Mother   . Stroke Mother   . Cancer Father     prostate  . Mental illness Brother   . Diabetes Neg Hx   . COPD Neg Hx   . Heart attack Neg Hx   . Hypertension Neg Hx     Allergies  Allergen Reactions  . Cephalexin Other (See Comments)    Unknown reaction  . Furacin [Nitrofurazone] Other (See Comments)    Unknown reaction  . Macrodantin Other (See Comments)     Unknown reaction  . Zestril [Lisinopril] Other (See Comments)    Unknown reaction    Current Outpatient Prescriptions on File Prior to Visit  Medication Sig Dispense Refill  . aspirin 81 MG tablet Take 81 mg by mouth daily.    [name] donepezil (ARICEPT) 23 MG TABS tablet Take 1 tablet (23 mg total) by mouth at bedtime. 90 tablet 2  . ferrous sulfate 325 (65 FE) MG tablet Take 325 mg by mouth 2 (two) times daily.     . folic acid (FOLVITE) 1 MG tablet Take 1 mg by mouth 2 (two) times daily.     . furosemide (LASIX) 40 MG tablet Take two tablets by mouth every morning. Take an additional tablet by mouth in the evening on Mon, Wed and Fri 75 tablet 10  . gabapentin (NEURONTIN) 100 MG capsule Take 1 capsule (100 mg total) by mouth at bedtime. 90 capsule 3  . hydrOXYzine (ATARAX/VISTARIL) 10 MG tablet TAKE (1) TABLET TWICE A DAY AS NEEDED. 120 tablet 0  . LORazepam (ATIVAN) 0.5 MG tablet TAKE 1 TABLET TWICE DAILY AS NEEDED FOR ANXIETY. 60 tablet 0  . memantine (NAMENDA) 10 MG tablet Take 1 tablet (10 mg total) by mouth 2 (two) times daily. 180 tablet 1  . metoprolol tartrate (LOPRESSOR) 25 MG tablet TAKE  (1)  TABLET  FOUR TIMES DAILY. 120 tablet 5  . mirabegron ER (MYRBETRIQ) 50 MG TB24 tablet Take 1 tablet (50 mg total) by mouth daily. 90 tablet 3  . Multiple Vitamin (MULTIVITAMIN WITH MINERALS) TABS tablet Take 1 tablet by mouth daily.    . Multiple Vitamins-Minerals (PRESERVISION AREDS 2 PO) Take 1 tablet by mouth 2 (two) times daily.     . nitroGLYCERIN (NITROSTAT) 0.4 MG SL tablet Place 1 tablet (0.4 mg total) under the tongue every 5 (five) minutes as needed for chest pain (x 3 doses daily). 30 tablet 3  . triamcinolone cream (KENALOG) 0.1 % Apply 1 application topically daily.      No current facility-administered medications on file prior to visit.     BP (!) 118/58   Temp 97.7 F (36.5 C) (Oral)       Objective:   Physical Exam  Constitutional: He is oriented to person, place, and  time. He appears well-developed and well-nourished. No distress.  HENT:  Mouth/Throat: Uvula is midline, oropharynx is clear and moist and mucous membranes  are normal.  Cardiovascular: Normal rate, regular rhythm, normal heart sounds and intact distal pulses.  Exam reveals no gallop and no friction rub.   No murmur heard. Pulmonary/Chest: Effort normal and breath sounds normal. No respiratory distress. He has no wheezes. He has no rales. He exhibits no tenderness.  Neurological: He is alert and oriented to person, place, and time.  Skin: Skin is warm and dry. No rash noted. He is not diaphoretic. No erythema. There is pallor.  Finger tips cool to touch. Good cap refill. Appears slightly pale   Psychiatric: He has a normal mood and affect. His behavior is normal. Judgment and thought content normal.  Nursing note and vitals reviewed.     Assessment & Plan:  1. Other fatigue - Possible anemia. Wil check CBC and TSH. Possible polypharmacy. Going to have family stop Atarax.  - CBC with Differential/Platelet - TSH - Follow up as needed  [name], NP

## 2016-02-19 LAB — CBC WITH DIFFERENTIAL/PLATELET
BASOS ABS: 0.2 10*3/uL — AB (ref 0.0–0.1)
Basophils Relative: 3.1 % — ABNORMAL HIGH (ref 0.0–3.0)
EOS PCT: 2 % (ref 0.0–5.0)
Eosinophils Absolute: 0.1 10*3/uL (ref 0.0–0.7)
HCT: 28.6 % — ABNORMAL LOW (ref 39.0–52.0)
HEMOGLOBIN: 9.3 g/dL — AB (ref 13.0–17.0)
LYMPHS ABS: 0.6 10*3/uL — AB (ref 0.7–4.0)
LYMPHS PCT: 11.1 % — AB (ref 12.0–46.0)
MCHC: 32.5 g/dL (ref 30.0–36.0)
MCV: 86.2 fl (ref 78.0–100.0)
MONOS PCT: 7.8 % (ref 3.0–12.0)
Monocytes Absolute: 0.4 10*3/uL (ref 0.1–1.0)
NEUTROS PCT: 76 % (ref 43.0–77.0)
Neutro Abs: 3.8 10*3/uL (ref 1.4–7.7)
Platelets: 191 10*3/uL (ref 150.0–400.0)
RBC: 3.31 Mil/uL — AB (ref 4.22–5.81)
RDW: 19.3 % — ABNORMAL HIGH (ref 11.5–15.5)
WBC: 5 10*3/uL (ref 4.0–10.5)

## 2016-02-19 LAB — TSH: TSH: 2.71 u[IU]/mL (ref 0.35–4.50)

## 2016-02-21 ENCOUNTER — Other Ambulatory Visit: Payer: Self-pay | Admitting: Adult Health

## 2016-02-21 DIAGNOSIS — F0281 Dementia in other diseases classified elsewhere with behavioral disturbance: Secondary | ICD-10-CM

## 2016-02-21 DIAGNOSIS — G309 Alzheimer's disease, unspecified: Principal | ICD-10-CM

## 2016-02-23 DIAGNOSIS — M542 Cervicalgia: Secondary | ICD-10-CM | POA: Diagnosis not present

## 2016-02-23 DIAGNOSIS — R2689 Other abnormalities of gait and mobility: Secondary | ICD-10-CM | POA: Diagnosis not present

## 2016-02-23 DIAGNOSIS — R296 Repeated falls: Secondary | ICD-10-CM | POA: Diagnosis not present

## 2016-02-23 DIAGNOSIS — E119 Type 2 diabetes mellitus without complications: Secondary | ICD-10-CM | POA: Diagnosis not present

## 2016-02-23 DIAGNOSIS — F0390 Unspecified dementia without behavioral disturbance: Secondary | ICD-10-CM | POA: Diagnosis not present

## 2016-02-23 DIAGNOSIS — I4891 Unspecified atrial fibrillation: Secondary | ICD-10-CM | POA: Diagnosis not present

## 2016-02-24 ENCOUNTER — Encounter: Payer: Self-pay | Admitting: Adult Health

## 2016-02-24 ENCOUNTER — Other Ambulatory Visit: Payer: Self-pay | Admitting: Adult Health

## 2016-02-24 DIAGNOSIS — G309 Alzheimer's disease, unspecified: Principal | ICD-10-CM

## 2016-02-24 DIAGNOSIS — F0281 Dementia in other diseases classified elsewhere with behavioral disturbance: Secondary | ICD-10-CM

## 2016-02-24 MED ORDER — LORAZEPAM 0.5 MG PO TABS: 0.5000 mg | ORAL_TABLET | Freq: Two times a day (BID) | ORAL | 0 refills | Status: DC | PRN

## 2016-02-24 NOTE — Telephone Encounter (Signed)
Ok to refill 

## 2016-02-25 ENCOUNTER — Other Ambulatory Visit: Payer: Self-pay | Admitting: Adult Health

## 2016-02-25 DIAGNOSIS — R296 Repeated falls: Secondary | ICD-10-CM | POA: Diagnosis not present

## 2016-02-25 DIAGNOSIS — I4891 Unspecified atrial fibrillation: Secondary | ICD-10-CM | POA: Diagnosis not present

## 2016-02-25 DIAGNOSIS — R2689 Other abnormalities of gait and mobility: Secondary | ICD-10-CM | POA: Diagnosis not present

## 2016-02-25 DIAGNOSIS — F0390 Unspecified dementia without behavioral disturbance: Secondary | ICD-10-CM | POA: Diagnosis not present

## 2016-02-25 DIAGNOSIS — E119 Type 2 diabetes mellitus without complications: Secondary | ICD-10-CM | POA: Diagnosis not present

## 2016-02-25 DIAGNOSIS — M542 Cervicalgia: Secondary | ICD-10-CM | POA: Diagnosis not present

## 2016-02-25 DIAGNOSIS — F0281 Dementia in other diseases classified elsewhere with behavioral disturbance: Secondary | ICD-10-CM

## 2016-02-25 DIAGNOSIS — G309 Alzheimer's disease, unspecified: Principal | ICD-10-CM

## 2016-02-26 NOTE — Telephone Encounter (Signed)
Rx has been called in  

## 2016-03-01 DIAGNOSIS — R2689 Other abnormalities of gait and mobility: Secondary | ICD-10-CM | POA: Diagnosis not present

## 2016-03-01 DIAGNOSIS — M542 Cervicalgia: Secondary | ICD-10-CM | POA: Diagnosis not present

## 2016-03-01 DIAGNOSIS — F0390 Unspecified dementia without behavioral disturbance: Secondary | ICD-10-CM | POA: Diagnosis not present

## 2016-03-01 DIAGNOSIS — I4891 Unspecified atrial fibrillation: Secondary | ICD-10-CM | POA: Diagnosis not present

## 2016-03-01 DIAGNOSIS — R296 Repeated falls: Secondary | ICD-10-CM | POA: Diagnosis not present

## 2016-03-01 DIAGNOSIS — E119 Type 2 diabetes mellitus without complications: Secondary | ICD-10-CM | POA: Diagnosis not present

## 2016-03-02 ENCOUNTER — Ambulatory Visit (INDEPENDENT_AMBULATORY_CARE_PROVIDER_SITE_OTHER): Payer: Medicare Other | Admitting: Adult Health

## 2016-03-02 ENCOUNTER — Encounter: Payer: Self-pay | Admitting: Adult Health

## 2016-03-02 VITALS — BP 118/74 | Temp 98.2°F | Ht 75.0 in | Wt 186.0 lb

## 2016-03-02 DIAGNOSIS — D509 Iron deficiency anemia, unspecified: Secondary | ICD-10-CM | POA: Diagnosis not present

## 2016-03-02 DIAGNOSIS — R600 Localized edema: Secondary | ICD-10-CM

## 2016-03-02 DIAGNOSIS — I4891 Unspecified atrial fibrillation: Secondary | ICD-10-CM | POA: Diagnosis not present

## 2016-03-02 DIAGNOSIS — E119 Type 2 diabetes mellitus without complications: Secondary | ICD-10-CM | POA: Diagnosis not present

## 2016-03-02 DIAGNOSIS — F0390 Unspecified dementia without behavioral disturbance: Secondary | ICD-10-CM | POA: Diagnosis not present

## 2016-03-02 DIAGNOSIS — R296 Repeated falls: Secondary | ICD-10-CM | POA: Diagnosis not present

## 2016-03-02 DIAGNOSIS — R2689 Other abnormalities of gait and mobility: Secondary | ICD-10-CM | POA: Diagnosis not present

## 2016-03-02 DIAGNOSIS — M542 Cervicalgia: Secondary | ICD-10-CM | POA: Diagnosis not present

## 2016-03-02 NOTE — Progress Notes (Signed)
Subjective:    Patient ID: [name], male    DOB: [birth year], 82 y.o.   MRN: [id number]  HPI  82 year old male who  has a past medical history of Anemia; Arrhythmia; Arthritis; Bladder cancer ([name]); Diabetes mellitus; ED (erectile dysfunction); Genital warts; GERD (gastroesophageal reflux disease); Hard of hearing; Hematochezia; Hypertension; Irritable bladder; Memory disorder; Neuropathy ([place]); and Restless leg. He presents with his [name] to this visit. [name] reports that [name]'s lower extremities have been swelling more than normal. This has been happening over the last week. He is taking his Lasix as prescribed. Does not elevate his legs and does not wear compression socks. There have been no changes in his diet.   They would also like to have his blood counts rechecked to see if his anemia has become worse. [name] reports that about ever 12-18 months that [name] needs to go to the ER for a blood transfusion due to anemia. [name] continues to be fatigued. His last H&H was 9.3/28.6 this was down from 11.1/33.7 a month prior. He does take oral iron BID  Review of Systems  Constitutional: Positive for activity change and fatigue.  Respiratory: Negative.   Cardiovascular: Positive for leg swelling.  Gastrointestinal: Negative.   Neurological: Negative.   All other systems reviewed and are negative.  Past Medical History:  Diagnosis Date  . Anemia    iron deficiency secondary to bleeding  . Arrhythmia    a fib  . Arthritis    DJD knee - left  . Bladder cancer (HCC)    BCG [name]  . Diabetes mellitus    NIDDM on oral medication  . ED (erectile dysfunction)   . Genital warts    recurrent. Not a problem  . GERD (gastroesophageal reflux disease)    treated with PPI  . Hard of hearing   . Hematochezia    has intermittent problem  . Hypertension    mild  . Irritable bladder   . Memory disorder   . Neuropathy (HCC)    burning discomfort feet/legs  . Restless  leg    treats with heat    Social History   Social History  . Marital status: Married    Spouse name: [name]  . Number of children: 4  . Years of education: 10th    Occupational History  . retired Retired   Social History Main Topics  . Smoking status: Former Smoker    Quit date: 08/28/1975  . Smokeless tobacco: Former Systems developer  . Alcohol use No  . Drug use: No  . Sexual activity: Not Currently   Other Topics Concern  . Not on file   Social History Narrative   Patient lives at home with his wife [name]) Married 61 years.   Retired   Education 10 th grade   Right handed   Caffeine one or two daily     Past Surgical History:  Procedure Laterality Date  . APPENDECTOMY  1946  . CARDIAC CATHETERIZATION N/A 01/13/2015   Procedure: Pericardiocentesis;  Surgeon: Leonie Man, MD;  Location: [facility];  Service: Cardiovascular;  Laterality: N/A;  . CARDIOVASCULAR STRESS TEST  03/28/2003   EF 55%.  Normal cardiolite study  . JOINT REPLACEMENT  1990's   left TKR  . KNEE ARTHROSCOPY  1980's   right knee  . TONSILLECTOMY  1938    Family History  Problem Relation Age of Onset  . Kidney disease Mother   .  Heart disease Mother   . Stroke Mother   . Cancer Father     prostate  . Mental illness Brother   . Diabetes Neg Hx   . COPD Neg Hx   . Heart attack Neg Hx   . Hypertension Neg Hx     Allergies  Allergen Reactions  . Cephalexin Other (See Comments)    Unknown reaction  . Furacin [Nitrofurazone] Other (See Comments)    Unknown reaction  . Macrodantin Other (See Comments)    Unknown reaction  . Zestril [Lisinopril] Other (See Comments)    Unknown reaction    Current Outpatient Prescriptions on File Prior to Visit  Medication Sig Dispense Refill  . aspirin 81 MG tablet Take 81 mg by mouth daily.    [name] donepezil (ARICEPT) 23 MG TABS tablet Take 1 tablet (23 mg total) by mouth at bedtime. 90 tablet 2  . ferrous sulfate 325 (65 FE) MG tablet Take 325 mg by  mouth 2 (two) times daily.     . folic acid (FOLVITE) 1 MG tablet Take 1 mg by mouth 2 (two) times daily.     . furosemide (LASIX) 40 MG tablet Take two tablets by mouth every morning. Take an additional tablet by mouth in the evening on Mon, Wed and Fri 75 tablet 10  . gabapentin (NEURONTIN) 100 MG capsule Take 1 capsule (100 mg total) by mouth at bedtime. 90 capsule 3  . hydrOXYzine (ATARAX/VISTARIL) 10 MG tablet TAKE (1) TABLET TWICE A DAY AS NEEDED. 120 tablet 0  . LORazepam (ATIVAN) 0.5 MG tablet TAKE 1 TABLET TWICE DAILY AS NEEDED FOR ANXIETY. 60 tablet 0  . memantine (NAMENDA) 10 MG tablet Take 1 tablet (10 mg total) by mouth 2 (two) times daily. 180 tablet 1  . metoprolol tartrate (LOPRESSOR) 25 MG tablet TAKE  (1)  TABLET  FOUR TIMES DAILY. 120 tablet 5  . mirabegron ER (MYRBETRIQ) 50 MG TB24 tablet Take 1 tablet (50 mg total) by mouth daily. 90 tablet 3  . Multiple Vitamin (MULTIVITAMIN WITH MINERALS) TABS tablet Take 1 tablet by mouth daily.    . Multiple Vitamins-Minerals (PRESERVISION AREDS 2 PO) Take 1 tablet by mouth 2 (two) times daily.     . nitroGLYCERIN (NITROSTAT) 0.4 MG SL tablet Place 1 tablet (0.4 mg total) under the tongue every 5 (five) minutes as needed for chest pain (x 3 doses daily). 30 tablet 3  . triamcinolone cream (KENALOG) 0.1 % Apply 1 application topically daily.      No current facility-administered medications on file prior to visit.     BP 118/74   Temp 98.2 F (36.8 C) (Oral)   Ht 6\' 3"  (1.905 m)   Wt 186 lb (84.4 kg)   BMI 23.25 kg/m       Objective:   Physical Exam  Constitutional: He is oriented to person, place, and time. He appears well-developed and well-nourished. No distress.  Cardiovascular: Normal rate, regular rhythm, normal heart sounds and intact distal pulses.  Exam reveals no gallop and no friction rub.   No murmur heard. Pulmonary/Chest: Effort normal and breath sounds normal. No respiratory distress. He has no wheezes. He has no  rales. He exhibits no tenderness.  Musculoskeletal: He exhibits edema.  Trace + 1 pitting edema in bilateral lower extremities   Neurological: He is alert and oriented to person, place, and time.  Skin: Skin is warm and dry. No rash noted. He is not diaphoretic. No erythema. There is pallor.  Psychiatric: He has a normal mood and affect. His behavior is normal. Judgment and thought content normal.  Nursing note and vitals reviewed.     Assessment & Plan:  1. Iron deficiency anemia, unspecified iron deficiency anemia type - CBC with Differential/Platelet  2. Lower extremity edema - Does not appear worse than normal. Will not change lasix dose at this time.  - Advised to elevate legs above heart at night.  - Follow up with worsening edema  [name], NP

## 2016-03-03 LAB — CBC WITH DIFFERENTIAL/PLATELET
BASOS ABS: 0 10*3/uL (ref 0.0–0.1)
BASOS PCT: 0.6 % (ref 0.0–3.0)
EOS ABS: 0.1 10*3/uL (ref 0.0–0.7)
Eosinophils Relative: 2.4 % (ref 0.0–5.0)
HCT: 34.4 % — ABNORMAL LOW (ref 39.0–52.0)
HEMOGLOBIN: 11.1 g/dL — AB (ref 13.0–17.0)
Lymphocytes Relative: 12.1 % (ref 12.0–46.0)
Lymphs Abs: 0.6 10*3/uL — ABNORMAL LOW (ref 0.7–4.0)
MCHC: 32.3 g/dL (ref 30.0–36.0)
MCV: 86.7 fl (ref 78.0–100.0)
MONO ABS: 0.5 10*3/uL (ref 0.1–1.0)
Monocytes Relative: 9.8 % (ref 3.0–12.0)
Neutro Abs: 3.6 10*3/uL (ref 1.4–7.7)
Neutrophils Relative %: 75.1 % (ref 43.0–77.0)
Platelets: 168 10*3/uL (ref 150.0–400.0)
RBC: 3.97 Mil/uL — AB (ref 4.22–5.81)
RDW: 19.5 % — AB (ref 11.5–15.5)
WBC: 4.8 10*3/uL (ref 4.0–10.5)

## 2016-03-04 ENCOUNTER — Telehealth: Payer: Self-pay

## 2016-03-04 ENCOUNTER — Encounter: Payer: Self-pay | Admitting: Adult Health

## 2016-03-04 NOTE — Telephone Encounter (Signed)
I called and spoke with patient's daughter, [name]. I advised her that per [facility], he really should be evaluated in the ER. [name] states that they did not want to wait around in the ER, especially with him acting out some. She states that he was given some Lorazepam, and since then he has calmed down some.  I advised that the patient could be evaluated here in the office, but we have no slots available tonight, so appt was made tomorrow with Dr. Martinique at 1:00. I advised [name] to keep an eye on him, and if his symptoms continue, or get worse, then he really needs to be evaluated in the ER as soon as possible.  She verbalized understanding.

## 2016-03-05 ENCOUNTER — Encounter: Payer: Self-pay | Admitting: Family Medicine

## 2016-03-05 ENCOUNTER — Ambulatory Visit (INDEPENDENT_AMBULATORY_CARE_PROVIDER_SITE_OTHER): Payer: Medicare Other | Admitting: Family Medicine

## 2016-03-05 VITALS — BP 124/80 | HR 60 | Resp 12 | Ht 75.0 in | Wt 186.0 lb

## 2016-03-05 DIAGNOSIS — F99 Mental disorder, not otherwise specified: Secondary | ICD-10-CM

## 2016-03-05 DIAGNOSIS — F0391 Unspecified dementia with behavioral disturbance: Secondary | ICD-10-CM | POA: Diagnosis not present

## 2016-03-05 DIAGNOSIS — R410 Disorientation, unspecified: Secondary | ICD-10-CM

## 2016-03-05 LAB — POCT URINALYSIS DIPSTICK
Bilirubin, UA: NEGATIVE
GLUCOSE UA: NEGATIVE
Ketones, UA: NEGATIVE
Leukocytes, UA: NEGATIVE
NITRITE UA: NEGATIVE
PROTEIN UA: NEGATIVE
RBC UA: NEGATIVE
SPEC GRAV UA: 1.02
UROBILINOGEN UA: 0.2
pH, UA: 6.5

## 2016-03-05 MED ORDER — HALOPERIDOL 0.5 MG PO TABS: 0.5000 mg | ORAL_TABLET | Freq: Every day | ORAL | 0 refills | Status: DC | PRN

## 2016-03-05 NOTE — Progress Notes (Signed)
HPI:  ACUTE VISIT:  No chief complaint on file.   [name] is a 83 y.o. male, who is here today with his sone , who is concerned about some intermittent MS changes and behavior,"confusion", "flare up dementia." Son provides history.  His son is also concerned about changed in Hg and wonders if this is causing symptoms.  Mr [name] has Hx of CHF, atrial fib, follows with cardiologists. Anemia, attributed to recurrent GI bleeding. He also has Hx of dementia and currently he is following with neurologists, Dr Hassell Done. He is on Aricept and Namenda.  According to son for the past 3 months he has had episodes of agitation, "mean" against wife, he sometimes does not "remember who he is" or his wife's name. Last night he left the house, trying to go to the house he used to live in about 40 years ago. He has tried to leave his house a few times in the past but seems to be getting worse. Usually worse after OV's and once he gets home, lasts about 2 days on/off but this time has been going on for 3 days.   According to his son, he was started on lorazepam about 3 months ago to help with these episodes, about 6 weeks ago it was increased to twice per day, and son increased it to 3 times per day about 3 days ago but does not seem to be helping.   He has not complained of headache, chest pain, abdominal pain. Family has not noted chills, fever, cough, dyspnea, wheezing, changes in bowel habits, or urinary symptoms. Lower extremity erythema has improved with Furosemide treatment. No changes in appetite.  Son wonders if he can take Benadryl to help him "relax."  He lives with wife and has a 24 hours caregiver. He has a walker at home,no changes in gait or activity level.    Review of Systems  Constitutional: Negative for appetite change, fatigue and fever.  HENT: Negative for mouth sores, nosebleeds, sore throat, trouble swallowing and voice change.   Respiratory: Negative for  cough, shortness of breath, wheezing and stridor.   Cardiovascular: Negative for chest pain, palpitations and leg swelling.  Gastrointestinal: Negative for abdominal pain, blood in stool, nausea and vomiting.       No changes in bowel habits.  Genitourinary: Negative for decreased urine volume, difficulty urinating, dysuria, frequency and hematuria.  Skin: Negative for rash.  Neurological: Negative for weakness and headaches.  Psychiatric/Behavioral: Positive for agitation, behavioral problems, confusion and sleep disturbance. Negative for hallucinations.      Current Outpatient Prescriptions on File Prior to Visit  Medication Sig Dispense Refill  . aspirin 81 MG tablet Take 81 mg by mouth daily.    [name] donepezil (ARICEPT) 23 MG TABS tablet Take 1 tablet (23 mg total) by mouth at bedtime. 90 tablet 2  . ferrous sulfate 325 (65 FE) MG tablet Take 325 mg by mouth 2 (two) times daily.     . folic acid (FOLVITE) 1 MG tablet Take 1 mg by mouth 2 (two) times daily.     . furosemide (LASIX) 40 MG tablet Take two tablets by mouth every morning. Take an additional tablet by mouth in the evening on Mon, Wed and Fri 75 tablet 10  . gabapentin (NEURONTIN) 100 MG capsule Take 1 capsule (100 mg total) by mouth at bedtime. 90 capsule 3  . hydrOXYzine (ATARAX/VISTARIL) 10 MG tablet TAKE (1) TABLET TWICE A DAY AS NEEDED. 120 tablet  0  . LORazepam (ATIVAN) 0.5 MG tablet TAKE 1 TABLET TWICE DAILY AS NEEDED FOR ANXIETY. 60 tablet 0  . memantine (NAMENDA) 10 MG tablet Take 1 tablet (10 mg total) by mouth 2 (two) times daily. 180 tablet 1  . metoprolol tartrate (LOPRESSOR) 25 MG tablet TAKE  (1)  TABLET  FOUR TIMES DAILY. 120 tablet 5  . mirabegron ER (MYRBETRIQ) 50 MG TB24 tablet Take 1 tablet (50 mg total) by mouth daily. 90 tablet 3  . Multiple Vitamin (MULTIVITAMIN WITH MINERALS) TABS tablet Take 1 tablet by mouth daily.    . Multiple Vitamins-Minerals (PRESERVISION AREDS 2 PO) Take 1 tablet by mouth 2 (two)  times daily.     . nitroGLYCERIN (NITROSTAT) 0.4 MG SL tablet Place 1 tablet (0.4 mg total) under the tongue every 5 (five) minutes as needed for chest pain (x 3 doses daily). 30 tablet 3  . triamcinolone cream (KENALOG) 0.1 % Apply 1 application topically daily.      No current facility-administered medications on file prior to visit.      Past Medical History:  Diagnosis Date  . Anemia    iron deficiency secondary to bleeding  . Arrhythmia    a fib  . Arthritis    DJD knee - left  . Bladder cancer (HCC)    [facility] [name]  . Diabetes mellitus    NIDDM on oral medication  . ED (erectile dysfunction)   . Genital warts    recurrent. Not a problem  . GERD (gastroesophageal reflux disease)    treated with PPI  . Hard of hearing   . Hematochezia    has intermittent problem  . Hypertension    mild  . Irritable bladder   . Memory disorder   . Neuropathy (HCC)    burning discomfort feet/legs  . Restless leg    treats with heat   Allergies  Allergen Reactions  . Cephalexin Other (See Comments)    Unknown reaction  . Furacin [Nitrofurazone] Other (See Comments)    Unknown reaction  . Macrodantin Other (See Comments)    Unknown reaction  . Zestril [Lisinopril] Other (See Comments)    Unknown reaction    Social History   Social History  . Marital status: Married    Spouse name: [name]  . Number of children: 4  . Years of education: 10th    Occupational History  . retired Retired   Social History Main Topics  . Smoking status: Former Smoker    Quit date: 08/28/1975  . Smokeless tobacco: Former Systems developer  . Alcohol use No  . Drug use: No  . Sexual activity: Not Currently   Other Topics Concern  . None   Social History Narrative   Patient lives at home with his wife [name]) Married 52 years.   Retired   Education 10 th grade   Right handed   Caffeine one or two daily     Vitals:   03/05/16 1311  BP: 124/80  Pulse: 60  Resp: 12   O2 sat 94% at  RA.  Body mass index is 23.25 kg/m.    Physical Exam  Nursing note and vitals reviewed. Constitutional: He appears well-developed. He is cooperative. He does not appear ill. No distress.  HENT:  Head: Atraumatic.  Mouth/Throat: Oropharynx is clear and moist and mucous membranes are normal.  Eyes: Conjunctivae and EOM are normal. Pupils are equal, round, and reactive to light.  Cardiovascular: Normal rate.  An irregular  rhythm present.  Murmur (SEM I/VI ,heard on LUSB) heard. Pulses:      Posterior tibial pulses are 2+ on the right side, and 2+ on the left side.  Respiratory: Effort normal and breath sounds normal. No respiratory distress.  GI: Soft. He exhibits no mass. There is no tenderness.  Musculoskeletal: He exhibits edema (R>L. Right 2+ and Left 1+ pitting edema.). He exhibits no tenderness.  Lymphadenopathy:    He has no cervical adenopathy.  Neurological: He is alert. He has normal strength.  He is in a wheel chair.  Skin: Skin is warm. No rash noted. No erythema.  Psychiatric: His mood appears not anxious. His affect is not angry and not inappropriate. Cognition and memory are impaired.  Appropriately groomed, No eye contact.      ASSESSMENT AND PLAN:     Diagnoses and all orders for this visit:  Dementia with behavioral disturbance, unspecified dementia type -     haloperidol (HALDOL) 0.5 MG tablet; Take 1 tablet (0.5 mg total) by mouth daily as needed for agitation. -     POCT urinalysis dipstick  Confusion and disorientation   We discussed possible causes, which certainly may include acute infection. Medications like Lorazepam could also aggravate problem.  I do not recommend Benadryl because can also cause confusion at his age. Recommend decreasing Lorazepam frequency and consider weaning it off. Haldol 1 mg can be tried for management of acute agitation episodes, explained son that evidence is not clear in this regard.  History and examination today  do not suggest respiratory infection. Urine dipstick negative and no urinary symptoms. 3 days ago he had CBC and about a month ago BMP. No changes in appetite.  Explained son that symptoms could be related to dementia. Family needs to consider making arrangements for future care in case he continues declining and trying to leave the house. I do not think further work-up is needed today.  Son instructed about warning signs.   Lab Results  Component Value Date   WBC 4.8 03/02/2016   HGB 11.1 (L) 03/02/2016   HCT 34.4 (L) 03/02/2016   MCV 86.7 03/02/2016   PLT 168.0 03/02/2016   Lab Results  Component Value Date   CREATININE 0.93 01/21/2016   BUN 22 01/21/2016   NA 142 01/21/2016   K 3.9 01/21/2016   CL 103 01/21/2016   CO2 32 01/21/2016     Return in about 2 weeks (around 03/19/2016) for 2-3 weeks with PCP.     -[name]'s son was advised to return or notify a doctor immediately if symptoms worsen or new concerns arise.       Aishani Kalis G. Martin[place], MD  [facility] [place] office.

## 2016-03-05 NOTE — Patient Instructions (Signed)
  [name] I have seen you today for an acute visit.  1. Dementia with behavioral disturbance, unspecified dementia type  - haloperidol (HALDOL) 0.5 MG tablet; Take 1 tablet (0.5 mg total) by mouth daily as needed for agitation.  Dispense: 15 tablet; Refill: 0  Urine today was normal. It is not a suggestion of a acute illness causing problem. Seems to be his dementia worsening.  Start decreasing Lorazepam, 2 daily as needed and in 1-2 weeks 1 tab daily as needed. Try Haloperidol  Plan of care needs to be discussed, family.  In general please monitor for signs of worsening symptoms and seek immediate medical attention if any concerning/warning symptom.   Please be sure you have an appointment already scheduled with your PCP before you leave today.

## 2016-03-05 NOTE — Progress Notes (Signed)
Pre visit review using our clinic review tool, if applicable. No additional management support is needed unless otherwise documented below in the visit note. 

## 2016-03-09 ENCOUNTER — Other Ambulatory Visit: Payer: Self-pay | Admitting: Adult Health

## 2016-03-09 ENCOUNTER — Telehealth: Payer: Self-pay

## 2016-03-09 NOTE — Telephone Encounter (Signed)
[employer] has denied the med haloperidol (HALDOL) 0.5 MG tablet  Used not FDA approved  Faxed info to be sent

## 2016-03-09 NOTE — Telephone Encounter (Signed)
Received PA request for Haloperidol 0.5 mg tablets. PA submitted & is pending. Key: [id number]

## 2016-03-09 NOTE — Telephone Encounter (Signed)
Is there something else that can be sent in

## 2016-03-09 NOTE — Telephone Encounter (Signed)
So for now continue decreasing benzodiazepine and follow as instructed with PCP.  Thanks, BJ

## 2016-03-10 NOTE — Telephone Encounter (Signed)
Message sent via my chart

## 2016-03-19 ENCOUNTER — Ambulatory Visit: Payer: Medicare Other | Admitting: Adult Health

## 2016-03-19 DIAGNOSIS — Z0289 Encounter for other administrative examinations: Secondary | ICD-10-CM

## 2016-04-06 ENCOUNTER — Other Ambulatory Visit: Payer: Self-pay | Admitting: Adult Health

## 2016-04-06 DIAGNOSIS — G309 Alzheimer's disease, unspecified: Principal | ICD-10-CM

## 2016-04-06 DIAGNOSIS — F0281 Dementia in other diseases classified elsewhere with behavioral disturbance: Secondary | ICD-10-CM

## 2016-04-06 NOTE — Telephone Encounter (Signed)
Ok to refill 

## 2016-04-06 NOTE — Telephone Encounter (Signed)
Ok to refill for one month  

## 2016-04-09 ENCOUNTER — Other Ambulatory Visit: Payer: Self-pay | Admitting: Family Medicine

## 2016-04-09 ENCOUNTER — Other Ambulatory Visit: Payer: Self-pay | Admitting: Adult Health

## 2016-04-09 NOTE — Telephone Encounter (Signed)
Ok to refill 

## 2016-04-09 NOTE — Telephone Encounter (Signed)
Sent to the pharmacy

## 2016-04-12 ENCOUNTER — Other Ambulatory Visit: Payer: Self-pay | Admitting: Family Medicine

## 2016-04-12 NOTE — Telephone Encounter (Signed)
Do you want patient to continue medication? 

## 2016-04-22 ENCOUNTER — Telehealth: Payer: Self-pay | Admitting: Cardiology

## 2016-04-22 NOTE — Telephone Encounter (Signed)
Spoke with son, DPR on file.  He states that pt's wife and caregiver told him that pt has been having increased edema in BLE.  Denies changes to SOB or other sx.  No vitals available.  Son states pt has not mentioned any issues.  Caregiver and wife concerned about swelling and wife mentioned to son that pt has been more fatigued.  Son asking about pt being seen, says not urgent but wants to get something scheduled so he can be evaluated.  Pt currently takes Furosemide 80mg  QD and takes an additional 40mg  on MWF.  Scheduled pt to see Bonney Leitz, PA-C on 04/26/16 at 2P.  Advised son if additional sx occur or swelling worsens to call us back.  Son verbalized understanding and was appreciative for assistance.

## 2016-04-22 NOTE — Telephone Encounter (Signed)
New message       Pt c/o swelling: STAT is pt has developed SOB within 24 hours  1. How long have you been experiencing swelling? For several weeks 2. Where is the swelling located? Feet/ankles  3.  Are you currently taking a "fluid pill"? yes 4.  Are you currently SOB? Pt is on oxygen 5.  Have you traveled recently? no

## 2016-04-25 NOTE — Progress Notes (Signed)
Cardiology Office Note    Date:  04/26/2016   ID:  [name], DOB 27-Jan-1928, MRN [id number]  PCP:  [name], NP  Cardiologist: [name]   CC: LE edema.   History of Present Illness:  [name] is a 83 y.o. male with a history of bladder cancer, DM, GERD, chronic atrial fibrillation, pleural effusion and pericardial effusion s/p pericardiocentesis who presents to clinic for evaluation of LE edema.   He was followed [name] with known history of chronic atrial fibrillation. He is not on Coumadin because of recurrent GI bleeds.  He had a cardiac catheterization on 08/07/01 showing normal coronary arteries and an ejection fraction of 60-65%. He had an echocardiogram on 04/21/11 showing an ejection fraction of 60-65%, moderate mitral regurgitation, mild aortic insufficiency, moderate tricuspid regurgitation, and pulmonary artery pressure was 46 mmHg. Previous notes state that hey had chronic mild pretibial edema as well as chronic mild pulmonary vascular congestion with basilar rales. He had a Lexiscan Myoview on 01/23/13 showing no ischemia.The patient had an echocardiogram 05/30/13 showing an ejection fraction of 55-60%.  He was admitted in 12/2014 for sepsis, acute respiratory failure, and suspected PNA. TTE revealed large circumferential effusion w/o tamponade and pleural effusion. He underwent a pericardiocentesis and thoracentesis. Workup per infectious disease for pericardial effusion/pleural effusion did not reveal any infectious etiology, no malignant cells identified in the left pleural effusion. It was felt  to be transudative.  Repeat 2D echo showed an EF of 45-50% with small to moderate pericardial effusion. He has been taking Furosemide 80mg  QD and takes an additional 40mg  on MWF  Today he presents to clinic for evaluation of LE edema. Here with son today who is his caregiver. He had a mild temperature last week and weakness. Also had some diarrhea today. No chest  pain or SOB. He is his a wheelchair today but usually walks with a walker. He does not wear compression stocking but has a pair. Per son, he has really slowed down recently. Also having a worsening tremor in hand. No SOB, orthopnea or PND. No dizziness or syncope. History is difficult due to dementia, but patient appears comfortable.     Past Medical History:  Diagnosis Date  . Anemia    iron deficiency secondary to bleeding  . Arrhythmia    a fib  . Arthritis    DJD knee - left  . Bladder cancer (HCC)    BC[facility] [name]  . Diabetes mellitus    NIDDM on oral medication  . ED (erectile dysfunction)   . Genital warts    recurrent. Not a problem  . GERD (gastroesophageal reflux disease)    treated with PPI  . Hard of hearing   . Hematochezia    has intermittent problem  . Hypertension    mild  . Irritable bladder   . Memory disorder   . Neuropathy (HCC)    burning discomfort feet/legs  . Restless leg    treats with heat    Past Surgical History:  Procedure Laterality Date  . APPENDECTOMY  1946  . CARDIAC CATHETERIZATION N/A 01/13/2015   Procedure: Pericardiocentesis;  Surgeon: Leonie Man, MD;  Location: [facility];  Service: Cardiovascular;  Laterality: N/A;  . CARDIOVASCULAR STRESS TEST  03/28/2003   EF 55%.  Normal cardiolite study  . JOINT REPLACEMENT  1990's   left TKR  . KNEE ARTHROSCOPY  1980's   right knee  . TONSILLECTOMY  1938  Current Medications: Outpatient Medications Prior to Visit  Medication Sig Dispense Refill  . aspirin 81 MG tablet Take 81 mg by mouth daily.    [name] donepezil (ARICEPT) 23 MG TABS tablet Take 1 tablet (23 mg total) by mouth at bedtime. 90 tablet 2  . ferrous sulfate 325 (65 FE) MG tablet Take 325 mg by mouth 2 (two) times daily.     . folic acid (FOLVITE) 1 MG tablet Take 1 mg by mouth 2 (two) times daily.     [name] gabapentin (NEURONTIN) 100 MG capsule Take 1 capsule (100 mg total) by mouth at bedtime. 90 capsule 3  .  haloperidol (HALDOL) 0.5 MG tablet Take 1 tablet (0.5 mg total) by mouth daily as needed for agitation. 15 tablet 0  . hydrOXYzine (ATARAX/VISTARIL) 10 MG tablet TAKE (1) TABLET TWICE A DAY AS NEEDED. 120 tablet 0  . LORazepam (ATIVAN) 0.5 MG tablet TAKE 1 TABLET TWICE DAILY AS NEEDED FOR ANXIETY. 60 tablet 0  . memantine (NAMENDA) 10 MG tablet Take 1 tablet (10 mg total) by mouth 2 (two) times daily. 180 tablet 1  . mirabegron ER (MYRBETRIQ) 50 MG TB24 tablet Take 1 tablet (50 mg total) by mouth daily. 90 tablet 3  . Multiple Vitamin (MULTIVITAMIN WITH MINERALS) TABS tablet Take 1 tablet by mouth daily.    . Multiple Vitamins-Minerals (PRESERVISION AREDS 2 PO) Take 1 tablet by mouth 2 (two) times daily.     . nitroGLYCERIN (NITROSTAT) 0.4 MG SL tablet Place 1 tablet (0.4 mg total) under the tongue every 5 (five) minutes as needed for chest pain (x 3 doses daily). 30 tablet 3  . triamcinolone cream (KENALOG) 0.1 % Apply 1 application topically daily.     . furosemide (LASIX) 40 MG tablet TAKE 2 TABLETS EVERY MORNING. TAKE AN ADDITIONAL TAB IN THE EVENING MON,WED, FRI. 75 tablet 10  . metoprolol tartrate (LOPRESSOR) 25 MG tablet TAKE  (1)  TABLET  FOUR TIMES DAILY. 120 tablet 5   No facility-administered medications prior to visit.      Allergies:   Cephalexin; Furacin [nitrofurazone]; Macrodantin; and Zestril [lisinopril]   Social History   Social History  . Marital status: Married    Spouse name: [name]  . Number of children: 4  . Years of education: 10th    Occupational History  . retired Retired   Social History Main Topics  . Smoking status: Former Smoker    Quit date: 08/28/1975  . Smokeless tobacco: Former Systems developer  . Alcohol use No  . Drug use: No  . Sexual activity: Not Currently   Other Topics Concern  . Not on file   Social History Narrative   Patient lives at home with his wife [name]) Married 35 years.   Retired   Education 10 th grade   Right handed   Caffeine one or  two daily      Family History:  The patient's family history includes Cancer in his father; Heart disease in his mother; Kidney disease in his mother; Mental illness in his brother; Stroke in his mother.      ROS:   Please see the history of present illness.    ROS All other systems reviewed and are negative.   PHYSICAL EXAM:   VS:  BP 102/64   Pulse 66   Ht 6\' 3"  (1.905 m)   Wt 191 lb (86.6 kg)   BMI 23.87 kg/m    GEN: Well nourished, well developed, in no acute distress  HEENT: normal  Neck: no JVD, carotid bruits, or masses Cardiac: irreg irreg ; no murmurs, rubs, or gallops,2 + LE edema bilaterally Respiratory:  clear to auscultation bilaterally, normal work of breathing, decreased breath sounds at bases GI: soft, nontender, nondistended, + BS MS: no deformity or atrophy  Skin: warm and dry, no rash Neuro:  Alert and Oriented x 3, Strength and sensation are intact Psych: euthymic mood, full affect    Wt Readings from Last 3 Encounters:  04/26/16 191 lb (86.6 kg)  03/05/16 186 lb (84.4 kg)  03/02/16 186 lb (84.4 kg)      Studies/Labs Reviewed:   EKG:  EKG is NOT ordered today.   Recent Labs: 08/18/2015: ALT 12 01/21/2016: BUN 22; Creatinine, Ser 0.93; Magnesium 2.1; Potassium 3.9; Sodium 142 02/18/2016: TSH 2.71 03/02/2016: Hemoglobin 11.1; Platelets 168.0   Lipid Panel    Component Value Date/Time   CHOL 125 03/20/2015 1259   TRIG 66 03/20/2015 1259   HDL 48 03/20/2015 1259   CHOLHDL 2.6 03/20/2015 1259   VLDL 13 03/20/2015 1259   LDLCALC 64 03/20/2015 1259    Additional studies/ records that were reviewed today include:  2D ECHO: 01/11/2015 LV EF: 55% -   60% Study Conclusions - Left ventricle: The cavity size was normal. Wall thickness was   normal. Systolic function was normal. The estimated ejection   fraction was in the range of 55% to 60%. Wall motion was normal;   there were no regional wall motion abnormalities. - Aortic valve: There was  mild regurgitation. - Mitral valve: Calcified annulus. - Left atrium: The atrium was severely dilated. - Right atrium: The atrium was severely dilated. - Atrial septum: No defect or patent foramen ovale was identified. - Pulmonary arteries: PA peak pressure: 32 mm Hg (S). - Pericardium, extracardiac: A large, free-flowing pericardial   effusion was identified circumferential to the heart. The fluid   contained focal strands. Impressions: - There is a very large pericardial effusion.   The presence of atrial fibrillation and previously diagnosed   pulmonary artery HTN limits the sensitivity of echo for the   diagnosis of tamponade. Frank chamber collapse is not seen. The   inferior vena cava is plethoric, consistent with severely   elevated central venous pressure. The pericardial effusion is new   since the previous study in 2015.    ASSESSMENT & PLAN:   LE edema: will order BMET and BNP. Increase lasix to 80mg  BID x 3 days then go back to 80 in AM with extra 40mg  on M, W,F. Weight has been stable. I have provided a Rx for compression stockings and discussed elevating legs and low salt diet.   Chronic Atrial Fibrillation: HR well controlled and BP soft, decrease Lopressor 25 mg from 4x day to 2x daily. Not on coumadin 2/2 GI bleeds  Chronic diastolic CHF: appears euvolemic outside of lower extremities. Will check BNP and increase lasix as above  Dementia: this is chronic and progressive.   Medication Adjustments/Labs and Tests Ordered: Current medicines are reviewed at length with the patient today.  Concerns regarding medicines are outlined above.  Medication changes, Labs and Tests ordered today are listed in the Patient Instructions below. Patient Instructions  Medication Instructions:  Your physician has recommended you make the following change in your medication:  1.  DECREASE the Metoprolol 25 mg to taking 1 tablet by mouth twice a day 2.  INCREASE the Lasix to 40 mg  taking 2 tablets twice a day  X's 3 days then go back to 2 tablets in the A.M And 1 tablet in the P.M. ON MONDAYS, WEDENSDAYS, & FRIDAYS ONLY   Labwork: TODAY:  BMET & PRO BNP  Testing/Procedures: None ordered  Follow-Up: Your physician recommends that you schedule a follow-up appointment in: 3 MONTHS WITH DR. END   Any Other Special Instructions Will Be Listed Below (If Applicable).  Low-Sodium Eating Plan Sodium raises blood pressure and causes water to be held in the body. Getting less sodium from food will help lower your blood pressure, reduce any swelling, and protect your heart, liver, and kidneys. We get sodium by adding salt (sodium chloride) to food. Most of our sodium comes from canned, boxed, and frozen foods. Restaurant foods, fast foods, and pizza are also very high in sodium. Even if you take medicine to lower your blood pressure or to reduce fluid in your body, getting less sodium from your food is important. What is my plan? Most people should limit their sodium intake to 2,300 mg a day. Your health care provider recommends that you limit your sodium intake to __________ a day. What do I need to know about this eating plan? For the low-sodium eating plan, you will follow these general guidelines:  Choose foods with a % Daily Value for sodium of less than 5% (as listed on the food label).  Use salt-free seasonings or herbs instead of table salt or sea salt.  Check with your health care provider or pharmacist before using salt substitutes.  Eat fresh foods.  Eat more vegetables and fruits.  Limit canned vegetables. If you do use them, rinse them well to decrease the sodium.  Limit cheese to 1 oz (28 g) per day.  Eat lower-sodium products, often labeled as "lower sodium" or "no salt added."  Avoid foods that contain monosodium glutamate (MSG). MSG is sometimes added to [place] food and some canned foods.  Check food labels (Nutrition Facts labels) on foods to  learn how much sodium is in one serving.  Eat more home-cooked food and less restaurant, buffet, and fast food.  When eating at a restaurant, ask that your food be prepared with less salt, or no salt if possible. How do I read food labels for sodium information? The Nutrition Facts label lists the amount of sodium in one serving of the food. If you eat more than one serving, you must multiply the listed amount of sodium by the number of servings. Food labels may also identify foods as:  Sodium free-Less than 5 mg in a serving.  Very low sodium-35 mg or less in a serving.  Low sodium-140 mg or less in a serving.  Light in sodium-50% less sodium in a serving. For example, if a food that usually has 300 mg of sodium is changed to become light in sodium, it will have 150 mg of sodium.  Reduced sodium-25% less sodium in a serving. For example, if a food that usually has 400 mg of sodium is changed to reduced sodium, it will have 300 mg of sodium. What foods can I eat? Grains  Low-sodium cereals, including oats, puffed wheat and rice, and shredded wheat cereals. Low-sodium crackers. Unsalted rice and pasta. Lower-sodium bread. Vegetables  Frozen or fresh vegetables. Low-sodium or reduced-sodium canned vegetables. Low-sodium or reduced-sodium tomato sauce and paste. Low-sodium or reduced-sodium tomato and vegetable juices. Fruits  Fresh, frozen, and canned fruit. Fruit juice. Meat and Other Protein Products  Low-sodium canned tuna and salmon. Fresh or frozen  meat, poultry, seafood, and fish. Lamb. Unsalted nuts. Dried beans, peas, and lentils without added salt. Unsalted canned beans. Homemade soups without salt. Eggs. Dairy  Milk. Soy milk. Ricotta cheese. Low-sodium or reduced-sodium cheeses. Yogurt. Condiments  Fresh and dried herbs and spices. Salt-free seasonings. Onion and garlic powders. Low-sodium varieties of mustard and ketchup. Fresh or refrigerated horseradish. Lemon juice. Fats  and Oils  Reduced-sodium salad dressings. Unsalted butter. Other  Unsalted popcorn and pretzels. The items listed above may not be a complete list of recommended foods or beverages. Contact your dietitian for more options.  What foods are not recommended? Grains  Instant hot cereals. Bread stuffing, pancake, and biscuit mixes. Croutons. Seasoned rice or pasta mixes. Noodle soup cups. Boxed or frozen macaroni and cheese. Self-rising flour. Regular salted crackers. Vegetables  Regular canned vegetables. Regular canned tomato sauce and paste. Regular tomato and vegetable juices. Frozen vegetables in sauces. Salted [place] fries. Olives. Angie Fava. Relishes. Sauerkraut. Salsa. Meat and Other Protein Products  Salted, canned, smoked, spiced, or pickled meats, seafood, or fish. Bacon, ham, sausage, hot dogs, corned beef, chipped beef, and packaged luncheon meats. Salt pork. Jerky. Pickled herring. Anchovies, regular canned tuna, and sardines. Salted nuts. Dairy  Processed cheese and cheese spreads. Cheese curds. Blue cheese and cottage cheese. Buttermilk. Condiments  Onion and garlic salt, seasoned salt, table salt, and sea salt. Canned and packaged gravies. Worcestershire sauce. Tartar sauce. Barbecue sauce. Teriyaki sauce. Soy sauce, including reduced sodium. Steak sauce. Fish sauce. Oyster sauce. Cocktail sauce. Horseradish that you find on the shelf. Regular ketchup and mustard. Meat flavorings and tenderizers. Bouillon cubes. Hot sauce. Tabasco sauce. Marinades. Taco seasonings. Relishes. Fats and Oils  Regular salad dressings. Salted butter. Margarine. Ghee. Bacon fat. Other  Potato and tortilla chips. Corn chips and puffs. Salted popcorn and pretzels. Canned or dried soups. Pizza. Frozen entrees and pot pies. The items listed above may not be a complete list of foods and beverages to avoid. Contact your dietitian for more information.  This information is not intended to replace advice given to  you by your health care provider. Make sure you discuss any questions you have with your health care provider. Document Released: 08/28/2001 Document Revised: 08/14/2015 Document Reviewed: 01/10/2013 Elsevier Interactive Patient Education  2017 [employer].    Heart Failure Heart failure is a condition in which the heart has trouble pumping blood because it has become weak or stiff. This means that the heart does not pump blood efficiently for the body to work well. For some people with heart failure, fluid may back up into the lungs and there may be swelling (edema) in the lower legs. Heart failure is usually a long-term (chronic) condition. It is important for you to take good care of yourself and follow the treatment plan from your health care provider. What are the causes? This condition is caused by some health problems, including:  High blood pressure (hypertension). Hypertension causes the heart muscle to work harder than normal. High blood pressure eventually causes the heart to become stiff and weak.  Coronary artery disease (CAD). CAD is the buildup of cholesterol and fat (plaques) in the arteries of the heart.  Heart attack (myocardial infarction). Injured tissue, which is caused by the heart attack, does not contract as well and the heart's ability to pump blood is weakened.  Abnormal heart valves. When the heart valves do not open and close properly, the heart muscle must pump harder to keep the blood flowing.  Heart muscle  disease (cardiomyopathy or myocarditis). Heart muscle disease is damage to the heart muscle from a variety of causes, such as drug or alcohol abuse, infections, or unknown causes. These can increase the risk of heart failure.  Lung disease. When the lungs do not work properly, the heart must work harder. What increases the risk? Risk of heart failure increases as a person ages. This condition is also more likely to develop in people who:  Are  overweight.  Are male.  Smoke or chew tobacco.  Abuse alcohol or illegal drugs.  Have taken medicines that can damage the heart, such as chemotherapy drugs.  Have diabetes.  High blood sugar (glucose) is associated with high fat (lipid) levels in the blood.  Diabetes can also damage tiny blood vessels that carry nutrients to the heart muscle.  Have abnormal heart rhythms.  Have thyroid problems.  Have low blood counts (anemia). What are the signs or symptoms? Symptoms of this condition include:  Shortness of breath with activity, such as when climbing stairs.  Persistent cough.  Swelling of the feet, ankles, legs, or abdomen.  Unexplained weight gain.  Difficulty breathing when lying flat (orthopnea).  Waking from sleep because of the need to sit up and get more air.  Rapid heartbeat.  Fatigue and loss of energy.  Feeling light-headed, dizzy, or close to fainting.  Loss of appetite.  Nausea.  Increased urination during the night (nocturia).  Confusion. How is this diagnosed? This condition is diagnosed based on:  Medical history, symptoms, and a physical exam.  Diagnostic tests, which may include:  Echocardiogram.  Electrocardiogram (ECG).  Chest X-ray.  Blood tests.  Exercise stress test.  Radionuclide scans.  Cardiac catheterization and angiogram. How is this treated? Treatment for this condition is aimed at managing the symptoms of heart failure. Medicines, behavioral changes, or other treatments may be necessary to treat heart failure. Medicines  These may include:  Angiotensin-converting enzyme (ACE) inhibitors. This type of medicine blocks the effects of a blood protein called angiotensin-converting enzyme. ACE inhibitors relax (dilate) the blood vessels and help to lower blood pressure.  Angiotensin receptor blockers (ARBs). This type of medicine blocks the actions of a blood protein called angiotensin. ARBs dilate the blood vessels  and help to lower blood pressure.  Water pills (diuretics). Diuretics cause the kidneys to remove salt and water from the blood. The extra fluid is removed through urination, leaving a lower volume of blood that the heart has to pump.  Beta blockers. These improve heart muscle strength and they prevent the heart from beating too quickly.  Digoxin. This increases the force of the heartbeat. Healthy behavior changes  These may include:  Reaching and maintaining a healthy weight.  Stopping smoking or chewing tobacco.  Eating heart-healthy foods.  Limiting or avoiding alcohol.  Stopping use of street drugs (illegal drugs).  Physical activity. Other treatments  These may include:  Surgery to open blocked coronary arteries or repair damaged heart valves.  Placement of a biventricular pacemaker to improve heart muscle function (cardiac resynchronization therapy). This device paces both the right ventricle and left ventricle.  Placement of a device to treat serious abnormal heart rhythms (implantable cardioverter defibrillator, or ICD).  Placement of a device to improve the pumping ability of the heart (left ventricular assist device, or LVAD).  Heart transplant. This can cure heart failure, and it is considered for certain patients who do not improve with other therapies. Follow these instructions at home: Medicines  Take over-the-counter and  prescription medicines only as told by your health care provider. Medicines are important in reducing the workload of your heart, slowing the progression of heart failure, and improving your symptoms.  Do not stop taking your medicine unless your health care provider told you to do that.  Do not skip any dose of medicine.  Refill your prescriptions before you run out of medicine. You need your medicines every day. Eating and drinking  Eat heart-healthy foods. Talk with a dietitian to make an eating plan that is right for you.  Choose  foods that contain no trans fat and are low in saturated fat and cholesterol. Healthy choices include fresh or frozen fruits and vegetables, fish, lean meats, legumes, fat-free or low-fat dairy products, and whole-grain or high-fiber foods.  Limit salt (sodium) if directed by your health care provider. Sodium restriction may reduce symptoms of heart failure. Ask a dietitian to recommend heart-healthy seasonings.  Use healthy cooking methods instead of frying. Healthy methods include roasting, grilling, broiling, baking, poaching, steaming, and stir-frying.  Limit your fluid intake if directed by your health care provider. Fluid restriction may reduce symptoms of heart failure. Lifestyle  Stop smoking or using chewing tobacco. Nicotine and tobacco can damage your heart and your blood vessels. Do not use nicotine gum or patches before talking to your health care provider.  Limit alcohol intake to no more than 1 drink per day for non-pregnant women and 2 drinks per day for men. One drink equals 12 oz of beer, 5 oz of wine, or 1 oz of hard liquor.  Drinking more than that is harmful to your heart. Tell your health care provider if you drink alcohol several times a week.  Talk with your health care provider about whether any level of alcohol use is safe for you.  If your heart has already been damaged by alcohol or you have severe heart failure, drinking alcohol should be stopped completely.  Stop use of illegal drugs.  Lose weight if directed by your health care provider. Weight loss may reduce symptoms of heart failure.  Do moderate physical activity if directed by your health care provider. People who are elderly and people with severe heart failure should consult with a health care provider for physical activity recommendations. Monitor important information  Weigh yourself every day. Keeping track of your weight daily helps you to notice excess fluid sooner.  Weigh yourself every morning  after you urinate and before you eat breakfast.  Wear the same amount of clothing each time you weigh yourself.  Record your daily weight. Provide your health care provider with your weight record.  Monitor and record your blood pressure as told by your health care provider.  Check your pulse as told by your health care provider. Dealing with extreme temperatures  If the weather is extremely hot:  Avoid vigorous physical activity.  Use air conditioning or fans or seek a cooler location.  Avoid caffeine and alcohol.  Wear loose-fitting, lightweight, and light-colored clothing.  If the weather is extremely cold:  Avoid vigorous physical activity.  Layer your clothes.  Wear mittens or gloves, a hat, and a scarf when you go outside.  Avoid alcohol. General instructions  Manage other health conditions such as hypertension, diabetes, thyroid disease, or abnormal heart rhythms as told by your health care provider.  Learn to manage stress. If you need help to do this, ask your health care provider.  Plan rest periods when fatigued.  Get ongoing education and support  as needed.  Participate in or seek rehabilitation as needed to maintain or improve independence and quality of life.  Stay up to date with immunizations. Keeping current on pneumococcal and influenza immunizations is especially important to prevent respiratory infections.  Keep all follow-up visits as told by your health care provider. This is important. Contact a health care provider if:  You have a rapid weight gain.  You have increasing shortness of breath that is unusual for you.  You are unable to participate in your usual physical activities.  You tire easily.  You cough more than normal, especially with physical activity.  You have any swelling or more swelling in areas such as your hands, feet, ankles, or abdomen.  You are unable to sleep because it is hard to breathe.  You feel like your heart  is beating quickly (palpitations).  You become dizzy or light-headed when you stand up. Get help right away if:  You have difficulty breathing.  You notice or your family notices a change in your awareness, such as having trouble staying awake or having difficulty with concentration.  You have pain or discomfort in your chest.  You have an episode of fainting (syncope). This information is not intended to replace advice given to you by your health care provider. Make sure you discuss any questions you have with your health care provider. Document Released: 03/08/2005 Document Revised: 11/11/2015 Document Reviewed: 10/01/2015 Elsevier Interactive Patient Education  2017 [place] NOT DRINK MORE THAN 49 OZ OF FLUID A DAY   WEAR YOUR COMPRESSION STOCKINGS        If you need a refill on your cardiac medications before your next appointment, please call your pharmacy.      Signed, Angelena Form, PA-C  04/26/2016 4:54 PM    [facility] [address], [place], [place]  [address] Phone: [phone]; Fax: [phone]

## 2016-04-26 ENCOUNTER — Ambulatory Visit (INDEPENDENT_AMBULATORY_CARE_PROVIDER_SITE_OTHER): Payer: Medicare Other | Admitting: Physician Assistant

## 2016-04-26 VITALS — BP 102/64 | HR 66 | Ht 75.0 in | Wt 191.0 lb

## 2016-04-26 DIAGNOSIS — I482 Chronic atrial fibrillation, unspecified: Secondary | ICD-10-CM

## 2016-04-26 DIAGNOSIS — F0280 Dementia in other diseases classified elsewhere without behavioral disturbance: Secondary | ICD-10-CM

## 2016-04-26 DIAGNOSIS — R600 Localized edema: Secondary | ICD-10-CM

## 2016-04-26 DIAGNOSIS — G309 Alzheimer's disease, unspecified: Secondary | ICD-10-CM

## 2016-04-26 DIAGNOSIS — I5032 Chronic diastolic (congestive) heart failure: Secondary | ICD-10-CM | POA: Diagnosis not present

## 2016-04-26 MED ORDER — FUROSEMIDE 40 MG PO TABS: ORAL_TABLET | ORAL | 3 refills | Status: DC

## 2016-04-26 MED ORDER — METOPROLOL TARTRATE 25 MG PO TABS: 25.0000 mg | ORAL_TABLET | Freq: Two times a day (BID) | ORAL | 3 refills | Status: DC

## 2016-04-26 NOTE — Patient Instructions (Addendum)
Medication Instructions:  Your physician has recommended you make the following change in your medication:  1.  DECREASE the Metoprolol 25 mg to taking 1 tablet by mouth twice a day 2.  INCREASE the Lasix to 40 mg taking 2 tablets twice a day X's 3 days then go back to 2 tablets in the A.M And 1 tablet in the P.M. ON MONDAYS, WEDENSDAYS, & FRIDAYS ONLY   Labwork: TODAY:  BMET & PRO BNP  Testing/Procedures: None ordered  Follow-Up: Your physician recommends that you schedule a follow-up appointment in: 3 MONTHS WITH DR. END   Any Other Special Instructions Will Be Listed Below (If Applicable).  Low-Sodium Eating Plan Sodium raises blood pressure and causes water to be held in the body. Getting less sodium from food will help lower your blood pressure, reduce any swelling, and protect your heart, liver, and kidneys. We get sodium by adding salt (sodium chloride) to food. Most of our sodium comes from canned, boxed, and frozen foods. Restaurant foods, fast foods, and pizza are also very high in sodium. Even if you take medicine to lower your blood pressure or to reduce fluid in your body, getting less sodium from your food is important. What is my plan? Most people should limit their sodium intake to 2,300 mg a day. Your health care provider recommends that you limit your sodium intake to __________ a day. What do I need to know about this eating plan? For the low-sodium eating plan, you will follow these general guidelines:  Choose foods with a % Daily Value for sodium of less than 5% (as listed on the food label).  Use salt-free seasonings or herbs instead of table salt or sea salt.  Check with your health care provider or pharmacist before using salt substitutes.  Eat fresh foods.  Eat more vegetables and fruits.  Limit canned vegetables. If you do use them, rinse them well to decrease the sodium.  Limit cheese to 1 oz (28 g) per day.  Eat lower-sodium products, often labeled  as "lower sodium" or "no salt added."  Avoid foods that contain monosodium glutamate (MSG). MSG is sometimes added to [place] food and some canned foods.  Check food labels (Nutrition Facts labels) on foods to learn how much sodium is in one serving.  Eat more home-cooked food and less restaurant, buffet, and fast food.  When eating at a restaurant, ask that your food be prepared with less salt, or no salt if possible. How do I read food labels for sodium information? The Nutrition Facts label lists the amount of sodium in one serving of the food. If you eat more than one serving, you must multiply the listed amount of sodium by the number of servings. Food labels may also identify foods as:  Sodium free-Less than 5 mg in a serving.  Very low sodium-35 mg or less in a serving.  Low sodium-140 mg or less in a serving.  Light in sodium-50% less sodium in a serving. For example, if a food that usually has 300 mg of sodium is changed to become light in sodium, it will have 150 mg of sodium.  Reduced sodium-25% less sodium in a serving. For example, if a food that usually has 400 mg of sodium is changed to reduced sodium, it will have 300 mg of sodium. What foods can I eat? Grains  Low-sodium cereals, including oats, puffed wheat and rice, and shredded wheat cereals. Low-sodium crackers. Unsalted rice and pasta. Lower-sodium bread. Vegetables  Frozen or fresh vegetables. Low-sodium or reduced-sodium canned vegetables. Low-sodium or reduced-sodium tomato sauce and paste. Low-sodium or reduced-sodium tomato and vegetable juices. Fruits  Fresh, frozen, and canned fruit. Fruit juice. Meat and Other Protein Products  Low-sodium canned tuna and salmon. Fresh or frozen meat, poultry, seafood, and fish. Lamb. Unsalted nuts. Dried beans, peas, and lentils without added salt. Unsalted canned beans. Homemade soups without salt. Eggs. Dairy  Milk. Soy milk. Ricotta cheese. Low-sodium or reduced-sodium  cheeses. Yogurt. Condiments  Fresh and dried herbs and spices. Salt-free seasonings. Onion and garlic powders. Low-sodium varieties of mustard and ketchup. Fresh or refrigerated horseradish. Lemon juice. Fats and Oils  Reduced-sodium salad dressings. Unsalted butter. Other  Unsalted popcorn and pretzels. The items listed above may not be a complete list of recommended foods or beverages. Contact your dietitian for more options.  What foods are not recommended? Grains  Instant hot cereals. Bread stuffing, pancake, and biscuit mixes. Croutons. Seasoned rice or pasta mixes. Noodle soup cups. Boxed or frozen macaroni and cheese. Self-rising flour. Regular salted crackers. Vegetables  Regular canned vegetables. Regular canned tomato sauce and paste. Regular tomato and vegetable juices. Frozen vegetables in sauces. Salted [place] fries. Olives. Angie Fava. Relishes. Sauerkraut. Salsa. Meat and Other Protein Products  Salted, canned, smoked, spiced, or pickled meats, seafood, or fish. Bacon, ham, sausage, hot dogs, corned beef, chipped beef, and packaged luncheon meats. Salt pork. Jerky. Pickled herring. Anchovies, regular canned tuna, and sardines. Salted nuts. Dairy  Processed cheese and cheese spreads. Cheese curds. Blue cheese and cottage cheese. Buttermilk. Condiments  Onion and garlic salt, seasoned salt, table salt, and sea salt. Canned and packaged gravies. Worcestershire sauce. Tartar sauce. Barbecue sauce. Teriyaki sauce. Soy sauce, including reduced sodium. Steak sauce. Fish sauce. Oyster sauce. Cocktail sauce. Horseradish that you find on the shelf. Regular ketchup and mustard. Meat flavorings and tenderizers. Bouillon cubes. Hot sauce. Tabasco sauce. Marinades. Taco seasonings. Relishes. Fats and Oils  Regular salad dressings. Salted butter. Margarine. Ghee. Bacon fat. Other  Potato and tortilla chips. Corn chips and puffs. Salted popcorn and pretzels. Canned or dried soups. Pizza. Frozen  entrees and pot pies. The items listed above may not be a complete list of foods and beverages to avoid. Contact your dietitian for more information.  This information is not intended to replace advice given to you by your health care provider. Make sure you discuss any questions you have with your health care provider. Document Released: 08/28/2001 Document Revised: 08/14/2015 Document Reviewed: 01/10/2013 Elsevier Interactive Patient Education  2017 [place].    Heart Failure Heart failure is a condition in which the heart has trouble pumping blood because it has become weak or stiff. This means that the heart does not pump blood efficiently for the body to work well. For some people with heart failure, fluid may back up into the lungs and there may be swelling (edema) in the lower legs. Heart failure is usually a long-term (chronic) condition. It is important for you to take good care of yourself and follow the treatment plan from your health care provider. What are the causes? This condition is caused by some health problems, including:  High blood pressure (hypertension). Hypertension causes the heart muscle to work harder than normal. High blood pressure eventually causes the heart to become stiff and weak.  Coronary artery disease (CAD). CAD is the buildup of cholesterol and fat (plaques) in the arteries of the heart.  Heart attack (myocardial infarction). Injured tissue, which is caused  by the heart attack, does not contract as well and the heart's ability to pump blood is weakened.  Abnormal heart valves. When the heart valves do not open and close properly, the heart muscle must pump harder to keep the blood flowing.  Heart muscle disease (cardiomyopathy or myocarditis). Heart muscle disease is damage to the heart muscle from a variety of causes, such as drug or alcohol abuse, infections, or unknown causes. These can increase the risk of heart failure.  Lung disease. When the  lungs do not work properly, the heart must work harder. What increases the risk? Risk of heart failure increases as a person ages. This condition is also more likely to develop in people who:  Are overweight.  Are male.  Smoke or chew tobacco.  Abuse alcohol or illegal drugs.  Have taken medicines that can damage the heart, such as chemotherapy drugs.  Have diabetes.  High blood sugar (glucose) is associated with high fat (lipid) levels in the blood.  Diabetes can also damage tiny blood vessels that carry nutrients to the heart muscle.  Have abnormal heart rhythms.  Have thyroid problems.  Have low blood counts (anemia). What are the signs or symptoms? Symptoms of this condition include:  Shortness of breath with activity, such as when climbing stairs.  Persistent cough.  Swelling of the feet, ankles, legs, or abdomen.  Unexplained weight gain.  Difficulty breathing when lying flat (orthopnea).  Waking from sleep because of the need to sit up and get more air.  Rapid heartbeat.  Fatigue and loss of energy.  Feeling light-headed, dizzy, or close to fainting.  Loss of appetite.  Nausea.  Increased urination during the night (nocturia).  Confusion. How is this diagnosed? This condition is diagnosed based on:  Medical history, symptoms, and a physical exam.  Diagnostic tests, which may include:  Echocardiogram.  Electrocardiogram (ECG).  Chest X-ray.  Blood tests.  Exercise stress test.  Radionuclide scans.  Cardiac catheterization and angiogram. How is this treated? Treatment for this condition is aimed at managing the symptoms of heart failure. Medicines, behavioral changes, or other treatments may be necessary to treat heart failure. Medicines  These may include:  Angiotensin-converting enzyme (ACE) inhibitors. This type of medicine blocks the effects of a blood protein called angiotensin-converting enzyme. ACE inhibitors relax (dilate) the  blood vessels and help to lower blood pressure.  Angiotensin receptor blockers (ARBs). This type of medicine blocks the actions of a blood protein called angiotensin. ARBs dilate the blood vessels and help to lower blood pressure.  Water pills (diuretics). Diuretics cause the kidneys to remove salt and water from the blood. The extra fluid is removed through urination, leaving a lower volume of blood that the heart has to pump.  Beta blockers. These improve heart muscle strength and they prevent the heart from beating too quickly.  Digoxin. This increases the force of the heartbeat. Healthy behavior changes  These may include:  Reaching and maintaining a healthy weight.  Stopping smoking or chewing tobacco.  Eating heart-healthy foods.  Limiting or avoiding alcohol.  Stopping use of street drugs (illegal drugs).  Physical activity. Other treatments  These may include:  Surgery to open blocked coronary arteries or repair damaged heart valves.  Placement of a biventricular pacemaker to improve heart muscle function (cardiac resynchronization therapy). This device paces both the right ventricle and left ventricle.  Placement of a device to treat serious abnormal heart rhythms (implantable cardioverter defibrillator, or ICD).  Placement of a device  to improve the pumping ability of the heart (left ventricular assist device, or LVAD).  Heart transplant. This can cure heart failure, and it is considered for certain patients who do not improve with other therapies. Follow these instructions at home: Medicines  Take over-the-counter and prescription medicines only as told by your health care provider. Medicines are important in reducing the workload of your heart, slowing the progression of heart failure, and improving your symptoms.  Do not stop taking your medicine unless your health care provider told you to do that.  Do not skip any dose of medicine.  Refill your prescriptions  before you run out of medicine. You need your medicines every day. Eating and drinking  Eat heart-healthy foods. Talk with a dietitian to make an eating plan that is right for you.  Choose foods that contain no trans fat and are low in saturated fat and cholesterol. Healthy choices include fresh or frozen fruits and vegetables, fish, lean meats, legumes, fat-free or low-fat dairy products, and whole-grain or high-fiber foods.  Limit salt (sodium) if directed by your health care provider. Sodium restriction may reduce symptoms of heart failure. Ask a dietitian to recommend heart-healthy seasonings.  Use healthy cooking methods instead of frying. Healthy methods include roasting, grilling, broiling, baking, poaching, steaming, and stir-frying.  Limit your fluid intake if directed by your health care provider. Fluid restriction may reduce symptoms of heart failure. Lifestyle  Stop smoking or using chewing tobacco. Nicotine and tobacco can damage your heart and your blood vessels. Do not use nicotine gum or patches before talking to your health care provider.  Limit alcohol intake to no more than 1 drink per day for non-pregnant women and 2 drinks per day for men. One drink equals 12 oz of beer, 5 oz of wine, or 1 oz of hard liquor.  Drinking more than that is harmful to your heart. Tell your health care provider if you drink alcohol several times a week.  Talk with your health care provider about whether any level of alcohol use is safe for you.  If your heart has already been damaged by alcohol or you have severe heart failure, drinking alcohol should be stopped completely.  Stop use of illegal drugs.  Lose weight if directed by your health care provider. Weight loss may reduce symptoms of heart failure.  Do moderate physical activity if directed by your health care provider. People who are elderly and people with severe heart failure should consult with a health care provider for physical  activity recommendations. Monitor important information  Weigh yourself every day. Keeping track of your weight daily helps you to notice excess fluid sooner.  Weigh yourself every morning after you urinate and before you eat breakfast.  Wear the same amount of clothing each time you weigh yourself.  Record your daily weight. Provide your health care provider with your weight record.  Monitor and record your blood pressure as told by your health care provider.  Check your pulse as told by your health care provider. Dealing with extreme temperatures  If the weather is extremely hot:  Avoid vigorous physical activity.  Use air conditioning or fans or seek a cooler location.  Avoid caffeine and alcohol.  Wear loose-fitting, lightweight, and light-colored clothing.  If the weather is extremely cold:  Avoid vigorous physical activity.  Layer your clothes.  Wear mittens or gloves, a hat, and a scarf when you go outside.  Avoid alcohol. General instructions  Manage other health conditions  such as hypertension, diabetes, thyroid disease, or abnormal heart rhythms as told by your health care provider.  Learn to manage stress. If you need help to do this, ask your health care provider.  Plan rest periods when fatigued.  Get ongoing education and support as needed.  Participate in or seek rehabilitation as needed to maintain or improve independence and quality of life.  Stay up to date with immunizations. Keeping current on pneumococcal and influenza immunizations is especially important to prevent respiratory infections.  Keep all follow-up visits as told by your health care provider. This is important. Contact a health care provider if:  You have a rapid weight gain.  You have increasing shortness of breath that is unusual for you.  You are unable to participate in your usual physical activities.  You tire easily.  You cough more than normal, especially with  physical activity.  You have any swelling or more swelling in areas such as your hands, feet, ankles, or abdomen.  You are unable to sleep because it is hard to breathe.  You feel like your heart is beating quickly (palpitations).  You become dizzy or light-headed when you stand up. Get help right away if:  You have difficulty breathing.  You notice or your family notices a change in your awareness, such as having trouble staying awake or having difficulty with concentration.  You have pain or discomfort in your chest.  You have an episode of fainting (syncope). This information is not intended to replace advice given to you by your health care provider. Make sure you discuss any questions you have with your health care provider. Document Released: 03/08/2005 Document Revised: 11/11/2015 Document Reviewed: 10/01/2015 Elsevier Interactive Patient Education  2017 [place] NOT DRINK MORE THAN 43 OZ OF FLUID A DAY   WEAR YOUR COMPRESSION STOCKINGS        If you need a refill on your cardiac medications before your next appointment, please call your pharmacy.

## 2016-04-27 ENCOUNTER — Telehealth: Payer: Self-pay | Admitting: *Deleted

## 2016-04-27 LAB — BASIC METABOLIC PANEL
BUN/Creatinine Ratio: 23 (ref 10–24)
BUN: 18 mg/dL (ref 8–27)
CO2: 24 mmol/L (ref 18–29)
Calcium: 8.6 mg/dL (ref 8.6–10.2)
Chloride: 99 mmol/L (ref 96–106)
Creatinine, Ser: 0.8 mg/dL (ref 0.76–1.27)
GFR calc Af Amer: 93 mL/min/{1.73_m2} (ref >59)
GFR, EST NON AFRICAN AMERICAN: 80 mL/min/{1.73_m2} (ref >59)
GLUCOSE: 102 mg/dL — AB (ref 65–99)
POTASSIUM: 3.8 mmol/L (ref 3.5–5.2)
SODIUM: 143 mmol/L (ref 134–144)

## 2016-04-27 LAB — PRO B NATRIURETIC PEPTIDE: NT-PRO BNP: 1905 pg/mL — AB (ref 0–486)

## 2016-04-27 MED ORDER — FUROSEMIDE 40 MG PO TABS: ORAL_TABLET | ORAL | 3 refills | Status: DC

## 2016-04-27 NOTE — Telephone Encounter (Signed)
-----   Message from Eileen Stanford, [place] sent at 04/27/2016 12:13 PM EST ----- His BNP was a little higher than I expected. Kidney function and electrolytes look good. Original plan was for lasix 80mg  BID x 3 days. Lets increase this to 5 days and then go back to regular schedule. Thanks!

## 2016-04-28 ENCOUNTER — Telehealth: Payer: Self-pay | Admitting: Physician Assistant

## 2016-04-28 NOTE — Telephone Encounter (Signed)
Patient's son made aware of Katie Thompson's recommendations. He verbalized understanding and appreciated the call.

## 2016-04-28 NOTE — Telephone Encounter (Signed)
NEW MESSAGE     HAS QUESTIONS ON THE INSTRUCTIONS YOU GAVE THEM, 1. SUGGESTION WAS CUTTING THE metoprolol tartrate (LOPRESSOR) 25 MG tablet Take 1 tablet (25 mg total) by mouth 2 (two) times daily.   THE MADE THIS CHANGE A YEAR AGO, AND HE IS STILL REALLY TIRED.  DOES HE NEED TO CUT IT BACK TO ONCE A DAY

## 2016-04-28 NOTE — Telephone Encounter (Signed)
1-  keep it at 25mg  BID.   2- increased lasix should not cause lethargy. Let us know if this doesn't get better.

## 2016-04-28 NOTE — Telephone Encounter (Signed)
The patient's son [name] is calling and has a couple of questions for [employer].   1- He states that at the office visit on 04/26/16 that [name] was under the impression that his father was taking the metoprolol 25 mg four times per day and told him to decrease it to take metoprolol 25 mg twice a day. He states that the patient has already been taking 25 mg twice a day for the past year and wants to know if he should continue taking it twice a day or if it needs to be decreased.  2- The son wanted to verify the dosing for the furosemide since there was a change between the last office visit and when they called about labs yesterday. I told the son that his father should be taking two 40 mg tablets (80 mg total) twice a day for 5 days now since his BNP was elevated and then after 5 days he needed to go back to the regular schedule of 2 tablets (80 mg) in the morning and 1 tablet (40 mg) at night on MWF. The son verbalized understanding. He did say that his father has been sleeping more since increasing the furosemide and wants to know if this could be causing this. He states that he has been urinating the same.

## 2016-05-03 ENCOUNTER — Other Ambulatory Visit: Payer: Self-pay | Admitting: Adult Health

## 2016-05-03 DIAGNOSIS — G309 Alzheimer's disease, unspecified: Principal | ICD-10-CM

## 2016-05-03 DIAGNOSIS — F0281 Dementia in other diseases classified elsewhere with behavioral disturbance: Secondary | ICD-10-CM

## 2016-05-04 NOTE — Telephone Encounter (Signed)
Ok to refill 

## 2016-05-07 ENCOUNTER — Telehealth: Payer: Self-pay | Admitting: Adult Health

## 2016-05-07 NOTE — Telephone Encounter (Signed)
° ° ° °  Pt request refill of the following:  LORazepam (ATIVAN) 0.5 MG tablet   Phamacy:  [facility]

## 2016-05-07 NOTE — Telephone Encounter (Signed)
Ok to refill 

## 2016-05-07 NOTE — Telephone Encounter (Signed)
Rx called in as directed.   

## 2016-05-10 ENCOUNTER — Telehealth: Payer: Self-pay | Admitting: Physician Assistant

## 2016-05-10 NOTE — Telephone Encounter (Signed)
New message    Pt [name] verbalized that is he calling because his father   is sleeping more than usual and he want to make sure   its not from the medication

## 2016-05-10 NOTE — Telephone Encounter (Signed)
I returned pts son, [name], [place] on file, call.  He has been advised that the Lasix wouldn't make pt sleep more than usual, as previously advised before, per [name], PA-C.  He will contact pt PCP.  He verbalized appreciation for the call back.

## 2016-05-24 ENCOUNTER — Encounter: Payer: Self-pay | Admitting: Adult Health

## 2016-05-27 ENCOUNTER — Ambulatory Visit (INDEPENDENT_AMBULATORY_CARE_PROVIDER_SITE_OTHER): Payer: Medicare Other | Admitting: Adult Health

## 2016-05-27 ENCOUNTER — Encounter: Payer: Self-pay | Admitting: Adult Health

## 2016-05-27 VITALS — BP 124/68 | Temp 97.6°F

## 2016-05-27 DIAGNOSIS — R5383 Other fatigue: Secondary | ICD-10-CM

## 2016-05-27 DIAGNOSIS — R300 Dysuria: Secondary | ICD-10-CM | POA: Diagnosis not present

## 2016-05-27 NOTE — Progress Notes (Signed)
Subjective:    Patient ID: [name], male    DOB: 07-06-1927, 83 y.o.   MRN: [id number]  HPI  83 year old male who  has a past medical history of Anemia; Arrhythmia; Arthritis; Bladder cancer ([place]); Diabetes mellitus; ED (erectile dysfunction); Genital warts; GERD (gastroesophageal reflux disease); Hard of hearing; Hematochezia; Hypertension; Irritable bladder; Memory disorder; Neuropathy ([place]); and Restless leg.   He presents with his son to this visit. Per son, he has received reports from other family members as well as home health that [name] has been more fatigued and weak over the last 24 hours. There was also report that he had slurred speech earlier last night but that this seems to have resolved.   Prior to the this, his son reports that " He has been in a great mood and has been very happy"    Review of Systems See HPI   Past Medical History:  Diagnosis Date  . Anemia    iron deficiency secondary to bleeding  . Arrhythmia    a fib  . Arthritis    DJD knee - left  . Bladder cancer ([facility])    BCG [name]  . Diabetes mellitus    NIDDM on oral medication  . ED (erectile dysfunction)   . Genital warts    recurrent. Not a problem  . GERD (gastroesophageal reflux disease)    treated with PPI  . Hard of hearing   . Hematochezia    has intermittent problem  . Hypertension    mild  . Irritable bladder   . Memory disorder   . Neuropathy ([facility])    burning discomfort feet/legs  . Restless leg    treats with heat    Social History   Social History  . Marital status: Married    Spouse name: [name]  . Number of children: 4  . Years of education: 10th    Occupational History  . retired Retired   Social History Main Topics  . Smoking status: Former Smoker    Quit date: 08/28/1975  . Smokeless tobacco: Former Systems developer  . Alcohol use No  . Drug use: No  . Sexual activity: Not Currently   Other Topics Concern  . Not on file   Social History Narrative   Patient lives at home with his wife [name]) Married 29 years.   Retired   Education 10 th grade   Right handed   Caffeine one or two daily     Past Surgical History:  Procedure Laterality Date  . APPENDECTOMY  1946  . CARDIAC CATHETERIZATION N/A 01/13/2015   Procedure: Pericardiocentesis;  Surgeon: Leonie Man, MD;  Location: [facility];  Service: Cardiovascular;  Laterality: N/A;  . CARDIOVASCULAR STRESS TEST  03/28/2003   EF 55%.  Normal cardiolite study  . JOINT REPLACEMENT  1990's   left TKR  . KNEE ARTHROSCOPY  1980's   right knee  . TONSILLECTOMY  1938    Family History  Problem Relation Age of Onset  . Kidney disease Mother   . Heart disease Mother   . Stroke Mother   . Cancer Father     prostate  . Mental illness Brother   . Diabetes Neg Hx   . COPD Neg Hx   . Heart attack Neg Hx   . Hypertension Neg Hx     Allergies  Allergen Reactions  . Cephalexin Other (See Comments)    Unknown reaction  . Furacin [Nitrofurazone]  Other (See Comments)    Unknown reaction  . Macrodantin Other (See Comments)    Unknown reaction  . Zestril [Lisinopril] Other (See Comments)    Unknown reaction    Current Outpatient Prescriptions on File Prior to Visit  Medication Sig Dispense Refill  . aspirin 81 MG tablet Take 81 mg by mouth daily.    [name] donepezil (ARICEPT) 23 MG TABS tablet Take 1 tablet (23 mg total) by mouth at bedtime. 90 tablet 2  . ferrous sulfate 325 (65 FE) MG tablet Take 325 mg by mouth 2 (two) times daily.     . folic acid (FOLVITE) 1 MG tablet Take 1 mg by mouth 2 (two) times daily.     . furosemide (LASIX) 40 MG tablet TAKE 2 TABLETS BY MOUTH TWICE DAY X'S 5 DAYS THEN GO BACK TO 2 TABLETS BY MOUTH IN THE A.M. AND 1 TABLET BY MOUTH IN THE P.M MONDAYS, WEDNESDAYS, & FRIDAYS 280 tablet 3  . gabapentin (NEURONTIN) 100 MG capsule Take 1 capsule (100 mg total) by mouth at bedtime. 90 capsule 3  . haloperidol (HALDOL) 0.5 MG tablet Take 1 tablet (0.5 mg  total) by mouth daily as needed for agitation. 15 tablet 0  . hydrOXYzine (ATARAX/VISTARIL) 10 MG tablet TAKE (1) TABLET TWICE A DAY AS NEEDED. 120 tablet 0  . LORazepam (ATIVAN) 0.5 MG tablet TAKE 1 TABLET TWICE DAILY AS NEEDED FOR ANXIETY. 60 tablet 0  . memantine (NAMENDA) 10 MG tablet Take 1 tablet (10 mg total) by mouth 2 (two) times daily. 180 tablet 1  . metoprolol tartrate (LOPRESSOR) 25 MG tablet Take 1 tablet (25 mg total) by mouth 2 (two) times daily. 180 tablet 3  . mirabegron ER (MYRBETRIQ) 50 MG TB24 tablet Take 1 tablet (50 mg total) by mouth daily. 90 tablet 3  . Multiple Vitamin (MULTIVITAMIN WITH MINERALS) TABS tablet Take 1 tablet by mouth daily.    . Multiple Vitamins-Minerals (PRESERVISION AREDS 2 PO) Take 1 tablet by mouth 2 (two) times daily.     . nitroGLYCERIN (NITROSTAT) 0.4 MG SL tablet Place 1 tablet (0.4 mg total) under the tongue every 5 (five) minutes as needed for chest pain (x 3 doses daily). 30 tablet 3  . triamcinolone cream (KENALOG) 0.1 % Apply 1 application topically daily.      No current facility-administered medications on file prior to visit.     BP 124/68   Temp 97.6 F (36.4 C) (Oral)       Objective:   Physical Exam  Constitutional: He is oriented to person, place, and time. He appears well-developed and well-nourished. No distress.  HENT:  Head: Normocephalic and atraumatic.  Right Ear: External ear normal.  Left Ear: External ear normal.  Nose: Nose normal.  Mouth/Throat: Oropharynx is clear and moist. No oropharyngeal exudate.  Eyes: Conjunctivae and EOM are normal. Pupils are equal, round, and reactive to light. Right eye exhibits no discharge. Left eye exhibits no discharge. No scleral icterus.  Neck: Normal range of motion. Neck supple.  Cardiovascular: Normal rate, regular rhythm, normal heart sounds and intact distal pulses.  Exam reveals no gallop.   No murmur heard. Pulmonary/Chest: Effort normal and breath sounds normal. No  respiratory distress. He has no wheezes. He has no rales. He exhibits no tenderness.  Lymphadenopathy:    He has no cervical adenopathy.  Neurological: He is alert and oriented to person, place, and time. He has normal strength. He displays no tremor. No cranial nerve deficit  or sensory deficit. He exhibits normal muscle tone. He displays no seizure activity. GCS eye subscore is 4. GCS verbal subscore is 5. GCS motor subscore is 6.  5/5 grip strength in bilateral arms  No facial droop  No slurred speech    Skin: Skin is warm and dry. No rash noted. He is not diaphoretic. No erythema. No pallor.  Psychiatric: He has a normal mood and affect. His behavior is normal. Judgment and thought content normal.  Nursing note and vitals reviewed.     Assessment & Plan:  1. Fatigue, unspecified type - No stroke like symptoms. Will test for possible UTI or anemia as cause. Also possible viral infection.  - CBC with Differential/Platelet - Basic metabolic panel; Future - POC Urinalysis Dipstick - Basic metabolic panel  [name], NP

## 2016-05-28 DIAGNOSIS — R300 Dysuria: Secondary | ICD-10-CM | POA: Diagnosis not present

## 2016-05-28 LAB — CBC WITH DIFFERENTIAL/PLATELET
Basophils Absolute: 0 10*3/uL (ref 0.0–0.1)
Basophils Relative: 0.5 % (ref 0.0–3.0)
EOS PCT: 3 % (ref 0.0–5.0)
Eosinophils Absolute: 0.1 10*3/uL (ref 0.0–0.7)
HCT: 38 % — ABNORMAL LOW (ref 39.0–52.0)
Hemoglobin: 12.5 g/dL — ABNORMAL LOW (ref 13.0–17.0)
LYMPHS ABS: 0.7 10*3/uL (ref 0.7–4.0)
Lymphocytes Relative: 15.2 % (ref 12.0–46.0)
MCHC: 32.8 g/dL (ref 30.0–36.0)
MCV: 85.4 fl (ref 78.0–100.0)
MONO ABS: 0.5 10*3/uL (ref 0.1–1.0)
Monocytes Relative: 12.3 % — ABNORMAL HIGH (ref 3.0–12.0)
NEUTROS PCT: 69 % (ref 43.0–77.0)
Neutro Abs: 3.1 10*3/uL (ref 1.4–7.7)
PLATELETS: 114 10*3/uL — AB (ref 150.0–400.0)
RBC: 4.45 Mil/uL (ref 4.22–5.81)
RDW: 17.5 % — ABNORMAL HIGH (ref 11.5–15.5)
WBC: 4.5 10*3/uL (ref 4.0–10.5)

## 2016-05-28 LAB — BASIC METABOLIC PANEL
BUN: 23 mg/dL (ref 6–23)
CALCIUM: 9.5 mg/dL (ref 8.4–10.5)
CHLORIDE: 105 meq/L (ref 96–112)
CO2: 31 mEq/L (ref 19–32)
CREATININE: 1.04 mg/dL (ref 0.40–1.50)
GFR: 71.78 mL/min (ref >60.00)
Glucose, Bld: 96 mg/dL (ref 70–99)
Potassium: 3.9 mEq/L (ref 3.5–5.1)
Sodium: 144 mEq/L (ref 135–145)

## 2016-05-28 LAB — POCT URINALYSIS DIPSTICK
Bilirubin, UA: NEGATIVE
Glucose, UA: NEGATIVE
KETONES UA: NEGATIVE
Nitrite, UA: NEGATIVE
PH UA: 7
PROTEIN UA: NEGATIVE
RBC UA: NEGATIVE
SPEC GRAV UA: 1.02
Urobilinogen, UA: 0.2

## 2016-05-28 NOTE — Addendum Note (Signed)
Addended by: Tomi Likens on: 05/28/2016 04:59 PM   Modules accepted: Orders

## 2016-05-28 NOTE — Telephone Encounter (Signed)
Updated patient's son, [name] on most recent lab values. We'll run a urine culture to make sure his symptoms are not indicative of UTI. His anemia has actually nearly resolved. [name] reports that [name] has been up moving around today and appears to be in good health

## 2016-05-31 LAB — URINE CULTURE

## 2016-06-01 ENCOUNTER — Other Ambulatory Visit: Payer: Self-pay | Admitting: Adult Health

## 2016-06-01 MED ORDER — CIPROFLOXACIN HCL 500 MG PO TABS: 500.0000 mg | ORAL_TABLET | Freq: Two times a day (BID) | ORAL | 0 refills | Status: DC

## 2016-06-03 ENCOUNTER — Other Ambulatory Visit: Payer: Self-pay | Admitting: Adult Health

## 2016-06-03 DIAGNOSIS — G309 Alzheimer's disease, unspecified: Principal | ICD-10-CM

## 2016-06-03 DIAGNOSIS — F0281 Dementia in other diseases classified elsewhere with behavioral disturbance: Secondary | ICD-10-CM

## 2016-06-03 NOTE — Telephone Encounter (Signed)
Ok to refill for one month  

## 2016-06-03 NOTE — Telephone Encounter (Signed)
Ok to refill 

## 2016-06-03 NOTE — Telephone Encounter (Signed)
Rx called in as directed.   

## 2016-06-08 ENCOUNTER — Encounter: Payer: Self-pay | Admitting: Neurology

## 2016-06-08 ENCOUNTER — Ambulatory Visit (INDEPENDENT_AMBULATORY_CARE_PROVIDER_SITE_OTHER): Payer: Medicare Other | Admitting: Neurology

## 2016-06-08 VITALS — BP 116/69 | HR 85 | Ht 75.0 in | Wt 175.0 lb

## 2016-06-08 DIAGNOSIS — F0391 Unspecified dementia with behavioral disturbance: Secondary | ICD-10-CM

## 2016-06-08 MED ORDER — MEMANTINE HCL-DONEPEZIL HCL ER 28-10 MG PO CP24: 1.0000 | ORAL_CAPSULE | ORAL | 11 refills | Status: DC

## 2016-06-08 NOTE — Progress Notes (Signed)
[facility]  PATIENT: [name] DOB: 1927-10-27   REASON FOR VISIT: Follow-up for dementia abnormality of gait HISTORY FROM: Patient and son and wife    HISTORY OF PRESENT ILLNESS: HISTORY: [name] is a 83 yo RH referred by cardiologist [name], Primary care is Dr. Timoteo Gaul, for evaluation of memory trouble, gait difficulty, he is accompanied by his wife, and daughter at today's clinical visit. He had a past medical history of chronic atrial fibrillation, was on Coumadin treatment, but developed recurrent GI bleeding, from reviewing the chart, he is not on any anticoagulation treatment, he also had past medical history of obesity, sedentary lifestyle, diabetes, Family noticed he had gradual onset memory trouble over the past few years, has been taking Aricept 10 mg every day, he also has gradual onset gait difficulty, began to use cane since 2012, he has no significant low back pain or neck pain, recent 12 months, since 2014, he has worsening gait difficulty, urinary incontinence, urgency, worsening memory trouble, he tends to repeat himself, forget familiar people's name, forgot to turn off stove, he has quit driving for a few months now, He has bilateral lower extremity edema, recent echocardiogram showed ejection fraction 60-65%, moderate mitral regurgitation, mild aortic stenosis  He went to school for 10 years, was a Armed forces operational officer, was able to retired at age 29, traveled all over the world, but has become sedentary over the past 10 years, he still enjoys playing his computer games   UPDATE May 7th 2015:I have reviewed MRI with patient and his family, MRI of brain showed moderate atrophy, mild to moderate periventricular small vessel disease. MRI of lumbar showed L4-5: pseudo-disc bulging with facet hypertrophy with moderate biforaminal foraminal stenosis, L5-S1: severe facet arthropathy with no spinal stenosis or foraminal narrowing. At L1-2, L2-3, L3-4:  disc bulging and facet hypertrophy with mild biforaminal foraminal stenosis  Laboratory showed a mild anemia, ferritin was 15, otherwise normal B12, CMP, Family reported multiple episodes of anemia, in the past, no etiology was found, he had physical therapy for 3 weeks, no significant change, he denies low back pain, does has urinary urgency, occasionally incontinence episodes. He has been taking gabapentin 300 mg every night which has helped his sleep, UPDATE Dec 03 2014: He is with his wife and [name] at today's visit.He has worsening memory trouble,    UPDATE June 08 2016: He is with his son at today's clinical visit, He lives with his wife, has 24-hour caregivers, he ambulate with walker at house, able to go to the dining table, frequent urination,  He just recovered from UTI, was noted to have increased confusion, now has improved  He is now taking Lorzepam 0.5mg  bid, which help controlling his evening time agitations, for a while he require as needed Haldol  REVIEW OF SYSTEMS: Full 14 system review of systems performed and notable only for those listed, all others are neg:   As above  ALLERGIES: Allergies  Allergen Reactions  . Cephalexin Other (See Comments)    Unknown reaction  . Furacin [Nitrofurazone] Other (See Comments)    Unknown reaction  . Macrodantin Other (See Comments)    Unknown reaction  . Zestril [Lisinopril] Other (See Comments)    Unknown reaction    HOME MEDICATIONS: Outpatient Medications Prior to Visit  Medication Sig Dispense Refill  . aspirin 81 MG tablet Take 81 mg by mouth daily.    [name] donepezil (ARICEPT) 23 MG TABS tablet Take 1 tablet (23  mg total) by mouth at bedtime. 90 tablet 2  . ferrous sulfate 325 (65 FE) MG tablet Take 325 mg by mouth 2 (two) times daily.     . folic acid (FOLVITE) 1 MG tablet Take 1 mg by mouth 2 (two) times daily.     . furosemide (LASIX) 40 MG tablet TAKE 2 TABLETS BY MOUTH TWICE DAY X'S 5 DAYS THEN GO BACK TO 2  TABLETS BY MOUTH IN THE A.M. AND 1 TABLET BY MOUTH IN THE P.M MONDAYS, WEDNESDAYS, & FRIDAYS 280 tablet 3  . gabapentin (NEURONTIN) 100 MG capsule Take 1 capsule (100 mg total) by mouth at bedtime. 90 capsule 3  . haloperidol (HALDOL) 0.5 MG tablet Take 1 tablet (0.5 mg total) by mouth daily as needed for agitation. 15 tablet 0  . hydrOXYzine (ATARAX/VISTARIL) 10 MG tablet TAKE (1) TABLET TWICE A DAY AS NEEDED. 120 tablet 0  . LORazepam (ATIVAN) 0.5 MG tablet TAKE 1 TABLET TWICE DAILY AS NEEDED FOR ANXIETY. 60 tablet 0  . memantine (NAMENDA) 10 MG tablet Take 1 tablet (10 mg total) by mouth 2 (two) times daily. 180 tablet 1  . metoprolol tartrate (LOPRESSOR) 25 MG tablet Take 1 tablet (25 mg total) by mouth 2 (two) times daily. 180 tablet 3  . mirabegron ER (MYRBETRIQ) 50 MG TB24 tablet Take 1 tablet (50 mg total) by mouth daily. 90 tablet 3  . Multiple Vitamin (MULTIVITAMIN WITH MINERALS) TABS tablet Take 1 tablet by mouth daily.    . Multiple Vitamins-Minerals (PRESERVISION AREDS 2 PO) Take 1 tablet by mouth 2 (two) times daily.     . nitroGLYCERIN (NITROSTAT) 0.4 MG SL tablet Place 1 tablet (0.4 mg total) under the tongue every 5 (five) minutes as needed for chest pain (x 3 doses daily). 30 tablet 3  . ciprofloxacin (CIPRO) 500 MG tablet Take 1 tablet (500 mg total) by mouth 2 (two) times daily. 10 tablet 0  . triamcinolone cream (KENALOG) 0.1 % Apply 1 application topically daily.      No facility-administered medications prior to visit.     PAST MEDICAL HISTORY: Past Medical History:  Diagnosis Date  . Anemia    iron deficiency secondary to bleeding  . Arrhythmia    a fib  . Arthritis    DJD knee - left  . Bladder cancer ([facility])    BCG [name]  . Diabetes mellitus    NIDDM on oral medication  . ED (erectile dysfunction)   . Genital warts    recurrent. Not a problem  . GERD (gastroesophageal reflux disease)    treated with PPI  . Hard of hearing   . Hematochezia    has  intermittent problem  . Hypertension    mild  . Irritable bladder   . Memory disorder   . Neuropathy ([facility])    burning discomfort feet/legs  . Restless leg    treats with heat    PAST SURGICAL HISTORY: Past Surgical History:  Procedure Laterality Date  . APPENDECTOMY  1946  . CARDIAC CATHETERIZATION N/A 01/13/2015   Procedure: Pericardiocentesis;  Surgeon: Leonie Man, MD;  Location: [facility];  Service: Cardiovascular;  Laterality: N/A;  . CARDIOVASCULAR STRESS TEST  03/28/2003   EF 55%.  Normal cardiolite study  . JOINT REPLACEMENT  1990's   left TKR  . KNEE ARTHROSCOPY  1980's   right knee  . TONSILLECTOMY  1938    FAMILY HISTORY: Family History  Problem Relation Age of  Onset  . Kidney disease Mother   . Heart disease Mother   . Stroke Mother   . Cancer Father     prostate  . Mental illness Brother   . Diabetes Neg Hx   . COPD Neg Hx   . Heart attack Neg Hx   . Hypertension Neg Hx     SOCIAL HISTORY: Social History   Social History  . Marital status: Married    Spouse name: [name]  . Number of children: 4  . Years of education: 10th    Occupational History  . retired Retired   Social History Main Topics  . Smoking status: Former Smoker    Quit date: 08/28/1975  . Smokeless tobacco: Former Systems developer  . Alcohol use No  . Drug use: No  . Sexual activity: Not Currently   Other Topics Concern  . Not on file   Social History Narrative   Patient lives at home with his wife [name]) Married 66 years.   Retired   Education 10 th grade   Right handed   Caffeine one or two daily      PHYSICAL EXAM  Vitals:   06/08/16 1510  BP: 116/69  Pulse: 85  Weight: 175 lb (79.4 kg)  Height: 6\' 3"  (1.905 m)   Body mass index is 21.87 kg/m. Gen: NAD, conversant, well nourised, obese, well groomed  Cardiovascular: Regular rate rhythm, no peripheral edema, Neck: Supple, no carotid bruit. Pulmonary: Clear to auscultation bilaterally     NEUROLOGICAL EXAM:   PHYSICAL EXAMNIATION:  Gen: NAD, conversant, well nourised, obese, well groomed                     Cardiovascular: Regular rate rhythm, no peripheral edema, warm, nontender. Eyes: Conjunctivae clear without exudates or hemorrhage Neck: Supple, no carotid bruits. Pulmonary: Clear to auscultation bilaterally   MENTAL STATUS: Mini-Mental Status Examination 9/30 Speech:   Mildly slurred depend on his son to answer question, cooperative on examination Cognition:     Orientation: He is not oriented to time and place     Recent and remote memory: Mr. 3 out of 3 recalls    Attention span and concentration: Could not spell world backwards     Language, naming, repeating,spontaneous speech: Has difficulty copy design or write a full sentence,     Fund of knowledge   CRANIAL NERVES: CN II:   Pupils are round equal and briskly reactive to light. CN III, IV, VI: extraocular movement are normal. No ptosis. CN V: Facial sensation is intact to pinprick in all 3 divisions bilaterally. Corneal responses are intact.  CN VII: Face is symmetric with normal eye closure and smile. CN VIII: Hearing is normal to rubbing fingers CN IX, X: Palate elevates symmetrically. Phonation is normal. CN XI: Head turning and shoulder shrug are intact CN XII: Tongue is midline with normal movements and no atrophy.  MOTOR: He has mild rigidity at 4 limbs, no significant weakness notice,  REFLEXES: Hypoactive and symmetric  SENSORY: Not reliable  COORDINATION: Rapid alternating movements and fine finger movements are intact. There is no dysmetria on finger-to-nose and heel-knee-shin.    GAIT/STANCE: He needs assistance to get up from seated position, difficulty initiate gait, especially the right side, narrow based, cautious, unsteady gait   DIAGNOSTIC DATA (LABS, IMAGING, TESTING) - I reviewed patient records, labs, notes, testing and imaging myself where available.  Lab Results   Component Value Date   WBC 4.5 05/27/2016  HGB 12.5 (L) 05/27/2016   HCT 38.0 (L) 05/27/2016   MCV 85.4 05/27/2016   PLT 114.0 (L) 05/27/2016      Component Value Date/Time   NA 144 05/27/2016 1535   NA 143 04/26/2016 1519   K 3.9 05/27/2016 1535   CL 105 05/27/2016 1535   CO2 31 05/27/2016 1535   GLUCOSE 96 05/27/2016 1535   BUN 23 05/27/2016 1535   BUN 18 04/26/2016 1519   CREATININE 1.04 05/27/2016 1535   CREATININE 0.74 03/20/2015 1259   CALCIUM 9.5 05/27/2016 1535   PROT 6.4 (L) 08/18/2015 1729   ALBUMIN 3.5 08/18/2015 1729   AST 18 08/18/2015 1729   ALT 12 (L) 08/18/2015 1729   ALKPHOS 80 08/18/2015 1729   BILITOT 0.6 08/18/2015 1729   GFRNONAA 80 04/26/2016 1519   GFRAA 93 04/26/2016 1519   Lab Results  Component Value Date   CHOL 125 03/20/2015   HDL 48 03/20/2015   LDLCALC 64 03/20/2015   TRIG 66 03/20/2015   CHOLHDL 2.6 03/20/2015     Lab Results  Component Value Date   TSH 2.71 02/18/2016      ASSESSMENT AND PLAN  83 y.o. year old male   Dementia with behavior issues, slow worsening  I went over his medication list in detail with his son,  Stop Gabapentin,  Change Namenda, Aricept to [facility] 10/28 daily  Marcial Pacas, M.D. Ph.D.  [facility] [place], [place] [address] Phone: [phone] Fax:      [phone]

## 2016-06-10 ENCOUNTER — Ambulatory Visit: Payer: Medicare Other | Admitting: Neurology

## 2016-06-10 ENCOUNTER — Telehealth: Payer: Self-pay | Admitting: Neurology

## 2016-06-10 NOTE — Telephone Encounter (Signed)
PA approved by [facility] [id number]) - completed through [email or web address] - pt [id number] - effective dates 06/09/16 through 06/09/17.  Pharmacy notified and will fill prescription for patient.

## 2016-06-10 NOTE — Telephone Encounter (Signed)
Re[place] with [facility] [employer] is calling to advise a letter will be sent to our office regarding approval of medication Namzaric as in below message.

## 2016-06-10 NOTE — Telephone Encounter (Signed)
[name] from [facility] [phone] option [phone]) called asking for more clinical info they need to know if pt is moderate to sever alzheimer .

## 2016-06-10 NOTE — Telephone Encounter (Signed)
Returned call to [facility] to provide further clinical information.  The PA for Namzaric is pending review.

## 2016-06-25 ENCOUNTER — Ambulatory Visit (INDEPENDENT_AMBULATORY_CARE_PROVIDER_SITE_OTHER): Payer: Medicare Other | Admitting: Adult Health

## 2016-06-25 ENCOUNTER — Encounter: Payer: Self-pay | Admitting: Adult Health

## 2016-06-25 VITALS — BP 132/68 | Ht 75.0 in | Wt 184.0 lb

## 2016-06-25 DIAGNOSIS — R059 Cough, unspecified: Secondary | ICD-10-CM

## 2016-06-25 DIAGNOSIS — R05 Cough: Secondary | ICD-10-CM | POA: Diagnosis not present

## 2016-06-25 NOTE — Progress Notes (Signed)
Subjective:    Patient ID: [name], male    DOB: 1927/11/01, 83 y.o.   MRN: [id number]  HPI  83 year old male who  has a past medical history of Anemia; Arrhythmia; Arthritis; Bladder cancer ([place]); Diabetes mellitus; ED (erectile dysfunction); Genital warts; GERD (gastroesophageal reflux disease); Hard of hearing; Hematochezia; Hypertension; Irritable bladder; Memory disorder; Neuropathy ([place]); and Restless leg.  He presents to the office today with his [name] for the acute complaint of cough. The cough seems to be non productive cough and has been present for 3 days.   Denies any fevers or feeling ill.   He was seen by [facility] on 06/08/2016 and had Namenda and Aricpet changed to [facility] 10/28. [name] reports that [name] is doing well this medication. He is more active and more talkative. [name] looks good today, he is very talkative and is joking around in the office.   Review of Systems  See HPI   Past Medical History:  Diagnosis Date  . Anemia    iron deficiency secondary to bleeding  . Arrhythmia    a fib  . Arthritis    DJD knee - left  . Bladder cancer (HCC)    BCG [name]  . Diabetes mellitus    NIDDM on oral medication  . ED (erectile dysfunction)   . Genital warts    recurrent. Not a problem  . GERD (gastroesophageal reflux disease)    treated with PPI  . Hard of hearing   . Hematochezia    has intermittent problem  . Hypertension    mild  . Irritable bladder   . Memory disorder   . Neuropathy (HCC)    burning discomfort feet/legs  . Restless leg    treats with heat    Social History   Social History  . Marital status: Married    Spouse name: [name]  . Number of children: 4  . Years of education: 10th    Occupational History  . retired Retired   Social History Main Topics  . Smoking status: Former Smoker    Quit date: 08/28/1975  . Smokeless tobacco: Former Systems developer  . Alcohol use No  . Drug use: No  . Sexual activity: Not  Currently   Other Topics Concern  . Not on file   Social History Narrative   Patient lives at home with his wife [name]) Married 32 years.   Retired   Education 10 th grade   Right handed   Caffeine one or two daily     Past Surgical History:  Procedure Laterality Date  . APPENDECTOMY  1946  . CARDIAC CATHETERIZATION N/A 01/13/2015   Procedure: Pericardiocentesis;  Surgeon: Leonie Man, MD;  Location: [place] [facility];  Service: Cardiovascular;  Laterality: N/A;  . CARDIOVASCULAR STRESS TEST  03/28/2003   EF 55%.  Normal cardiolite study  . JOINT REPLACEMENT  1990's   left TKR  . KNEE ARTHROSCOPY  1980's   right knee  . TONSILLECTOMY  1938    Family History  Problem Relation Age of Onset  . Kidney disease Mother   . Heart disease Mother   . Stroke Mother   . Cancer Father     prostate  . Mental illness Brother   . Diabetes Neg Hx   . COPD Neg Hx   . Heart attack Neg Hx   . Hypertension Neg Hx     Allergies  Allergen Reactions  . Cephalexin Other (  See Comments)    Unknown reaction  . Furacin [Nitrofurazone] Other (See Comments)    Unknown reaction  . Macrodantin Other (See Comments)    Unknown reaction  . Zestril [Lisinopril] Other (See Comments)    Unknown reaction    Current Outpatient Prescriptions on File Prior to Visit  Medication Sig Dispense Refill  . aspirin 81 MG tablet Take 81 mg by mouth daily.    . ferrous sulfate 325 (65 FE) MG tablet Take 325 mg by mouth 2 (two) times daily.     . folic acid (FOLVITE) 1 MG tablet Take 1 mg by mouth 2 (two) times daily.     . furosemide (LASIX) 40 MG tablet TAKE 2 TABLETS BY MOUTH TWICE DAY X'S 5 DAYS THEN GO BACK TO 2 TABLETS BY MOUTH IN THE A.M. AND 1 TABLET BY MOUTH IN THE P.M MONDAYS, WEDNESDAYS, & FRIDAYS 280 tablet 3  . hydrOXYzine (ATARAX/VISTARIL) 10 MG tablet TAKE (1) TABLET TWICE A DAY AS NEEDED. 120 tablet 0  . LORazepam (ATIVAN) 0.5 MG tablet TAKE 1 TABLET TWICE DAILY AS NEEDED FOR ANXIETY. 60  tablet 0  . Memantine HCl-Donepezil HCl (NAMZARIC) 28-10 MG CP24 Take 1 tablet by mouth every morning. 30 capsule 11  . metoprolol tartrate (LOPRESSOR) 25 MG tablet Take 1 tablet (25 mg total) by mouth 2 (two) times daily. 180 tablet 3  . mirabegron ER (MYRBETRIQ) 50 MG TB24 tablet Take 1 tablet (50 mg total) by mouth daily. 90 tablet 3  . Multiple Vitamin (MULTIVITAMIN WITH MINERALS) TABS tablet Take 1 tablet by mouth daily.    . Multiple Vitamins-Minerals (PRESERVISION AREDS 2 PO) Take 1 tablet by mouth 2 (two) times daily.     . nitroGLYCERIN (NITROSTAT) 0.4 MG SL tablet Place 1 tablet (0.4 mg total) under the tongue every 5 (five) minutes as needed for chest pain (x 3 doses daily). 30 tablet 3   No current facility-administered medications on file prior to visit.     BP 132/68 (BP Location: Left Arm, Patient Position: Sitting, Cuff Size: Normal)   Ht 6\' 3"  (1.905 m)   Wt 184 lb (83.5 kg)   BMI 23.00 kg/m       Objective:   Physical Exam  Constitutional: He is oriented to person, place, and time. He appears well-developed and well-nourished. No distress.  Cardiovascular: Normal rate, regular rhythm, normal heart sounds and intact distal pulses.  Exam reveals no gallop and no friction rub.   No murmur heard. Pulmonary/Chest: Effort normal and breath sounds normal. No respiratory distress. He has no wheezes. He has no rales. He exhibits no tenderness.  Neurological: He is alert and oriented to person, place, and time.  Skin: Skin is warm and dry. No rash noted. He is not diaphoretic. No erythema. No pallor.  Psychiatric: He has a normal mood and affect. His behavior is normal.  Nursing note and vitals reviewed.     Assessment & Plan:  1. Cough - Allergies vs viral. No signs of bacterial infection. No wheezing noted on exam  - Advised Mucinex and seasonal allergy medication  - Follow up if not improved   [name], NP

## 2016-06-30 ENCOUNTER — Other Ambulatory Visit: Payer: Self-pay | Admitting: Adult Health

## 2016-06-30 DIAGNOSIS — F0281 Dementia in other diseases classified elsewhere with behavioral disturbance: Secondary | ICD-10-CM

## 2016-06-30 DIAGNOSIS — G309 Alzheimer's disease, unspecified: Principal | ICD-10-CM

## 2016-06-30 NOTE — Telephone Encounter (Signed)
Ok to refill each medication

## 2016-06-30 NOTE — Telephone Encounter (Signed)
Ok to refill 

## 2016-07-01 ENCOUNTER — Other Ambulatory Visit: Payer: Self-pay

## 2016-07-01 NOTE — Telephone Encounter (Signed)
Lorazepam called in as directed. Hydroxyzine sent in e-scribe.

## 2016-07-19 ENCOUNTER — Telehealth: Payer: Self-pay | Admitting: Neurology

## 2016-07-19 NOTE — Telephone Encounter (Signed)
[name] called said he is doing much better up until yesterday. He has not had to haloperidon until yesterday. He has not gotten up today though. He said Dr Darreld Mclean said she would like to prescribe something better than halperidon. Pharmacy is [facility].

## 2016-07-26 ENCOUNTER — Other Ambulatory Visit: Payer: Self-pay | Admitting: Adult Health

## 2016-07-26 DIAGNOSIS — F0281 Dementia in other diseases classified elsewhere with behavioral disturbance: Secondary | ICD-10-CM

## 2016-07-26 DIAGNOSIS — G309 Alzheimer's disease, unspecified: Principal | ICD-10-CM

## 2016-07-26 NOTE — Telephone Encounter (Signed)
Ok to refill 

## 2016-07-27 ENCOUNTER — Other Ambulatory Visit: Payer: Self-pay | Admitting: *Deleted

## 2016-07-27 ENCOUNTER — Encounter: Payer: Self-pay | Admitting: Adult Health

## 2016-07-27 ENCOUNTER — Telehealth: Payer: Self-pay | Admitting: *Deleted

## 2016-07-27 ENCOUNTER — Encounter: Payer: Self-pay | Admitting: Neurology

## 2016-07-27 MED ORDER — HALOPERIDOL 0.5 MG PO TABS: ORAL_TABLET | ORAL | 0 refills | Status: DC

## 2016-07-27 NOTE — Telephone Encounter (Signed)
Rx has been called in as directed.  

## 2016-07-27 NOTE — Telephone Encounter (Signed)
Email from patient's family:  Dr. Krista Blue,  [place]. I was in your office last month with my father, [name].   #1   the changes in his meds that you made are working very well, thanks.  #2   at that visit , I let you know that we had a tablet called Hal,,, something for occations of dementia when dad becomes angry and beligurant. At that time, you said to let you know when he needed medication for that and you would prescribe him one that you felt better about. I find that now we are out. It is only needed for emergencies.   Thanking you in advance, Jerl Munyan   [phone]   Dr. Krista Blue has provided the patient with a prescription for Haldol 0.5mg , one tablet daily prn.  The patient's son is interested in getting more information concerning the research trial and would like a call back to further discuss.

## 2016-07-28 ENCOUNTER — Encounter: Payer: Self-pay | Admitting: Family Medicine

## 2016-07-28 ENCOUNTER — Ambulatory Visit (INDEPENDENT_AMBULATORY_CARE_PROVIDER_SITE_OTHER): Payer: Medicare Other | Admitting: Family Medicine

## 2016-07-28 VITALS — BP 110/64 | HR 64 | Temp 98.1°F

## 2016-07-28 DIAGNOSIS — R05 Cough: Secondary | ICD-10-CM

## 2016-07-28 DIAGNOSIS — R5383 Other fatigue: Secondary | ICD-10-CM | POA: Diagnosis not present

## 2016-07-28 DIAGNOSIS — R531 Weakness: Secondary | ICD-10-CM

## 2016-07-28 DIAGNOSIS — R059 Cough, unspecified: Secondary | ICD-10-CM

## 2016-07-28 LAB — POCT URINALYSIS DIPSTICK
BILIRUBIN UA: NEGATIVE
GLUCOSE UA: NEGATIVE
Ketones, UA: NEGATIVE
Leukocytes, UA: NEGATIVE
Nitrite, UA: NEGATIVE
Protein, UA: NEGATIVE
RBC UA: NEGATIVE
SPEC GRAV UA: 1.015 (ref 1.010–1.025)
UROBILINOGEN UA: 0.2 U/dL
pH, UA: 7 (ref 5.0–8.0)

## 2016-07-28 NOTE — Telephone Encounter (Signed)
Provided information to [place] in research to call pt with more information.

## 2016-07-28 NOTE — Progress Notes (Signed)
Subjective:     Patient ID: [name], male   DOB: Jun 22, 1927, 83 y.o.   MRN: [id number]  HPI Patient seen accompanied by son with nonspecific issues of increased fatigue and increased general weakness over the past couple days. He had very similar symptoms back in March and ended up with Escherichia coli UTI which improved with Cipro. He has advanced dementia. He has 24/7 care. At baseline, he walks some in the house and is still ambulating but much more slowly and sleeping more past couple days. No documented fever. No nausea or vomiting. No recent cough. Denies recent fall and no known head injury.  Intermittent constipation but he think he's had some recent bowel movements. No vomiting or diarrhea.  Past Medical History:  Diagnosis Date  . Anemia    iron deficiency secondary to bleeding  . Arrhythmia    a fib  . Arthritis    DJD knee - left  . Bladder cancer (HCC)    BCG [name]  . Diabetes mellitus    NIDDM on oral medication  . ED (erectile dysfunction)   . Genital warts    recurrent. Not a problem  . GERD (gastroesophageal reflux disease)    treated with PPI  . Hard of hearing   . Hematochezia    has intermittent problem  . Hypertension    mild  . Irritable bladder   . Memory disorder   . Neuropathy    burning discomfort feet/legs  . Restless leg    treats with heat   Past Surgical History:  Procedure Laterality Date  . APPENDECTOMY  1946  . CARDIAC CATHETERIZATION N/A 01/13/2015   Procedure: Pericardiocentesis;  Surgeon: Leonie Man, MD;  Location: [place] [facility];  Service: Cardiovascular;  Laterality: N/A;  . CARDIOVASCULAR STRESS TEST  03/28/2003   EF 55%.  Normal cardiolite study  . JOINT REPLACEMENT  1990's   left TKR  . KNEE ARTHROSCOPY  1980's   right knee  . TONSILLECTOMY  1938    reports that he quit smoking about 40 years ago. He has quit using smokeless tobacco. He reports that he does not drink alcohol or use drugs. family history  includes Cancer in his father; Heart disease in his mother; Kidney disease in his mother; Mental illness in his brother; Stroke in his mother. Allergies  Allergen Reactions  . Cephalexin Other (See Comments)    Unknown reaction  . Furacin [Nitrofurazone] Other (See Comments)    Unknown reaction  . Macrodantin Other (See Comments)    Unknown reaction  . Zestril [Lisinopril] Other (See Comments)    Unknown reaction     Review of Systems Review of systems not obtainable from patient as he has advanced dementia    Objective:   Physical Exam  Constitutional:  Patient is dozing in a wheelchair but is easily arousable  Neck: Neck supple.  Cardiovascular: Normal rate and regular rhythm.   Pulmonary/Chest: Effort normal and breath sounds normal. No respiratory distress. He has no wheezes. He has no rales.  Abdominal: Soft. He exhibits no mass. There is no tenderness. There is no rebound and no guarding.  Musculoskeletal: He exhibits no edema.       Assessment:     Patient presents with advanced dementia with nonspecific lethargy past couple days. He had similar presentations in the past with urinary tract infection and this needs to be ruled out    Plan:     -Son will return with  urine specimen -If unremarkable, consider chest x-ray to further evaluate -He had recent labs with basic chemistries and CBC that were unremarkable -Watch for fever or any other new symptoms  Eulas Post MD [facility]

## 2016-07-28 NOTE — Patient Instructions (Signed)
Return with urine specimen soon as possible Make sure he is staying well-hydrated Watch for fever or any other new symptoms

## 2016-07-29 ENCOUNTER — Ambulatory Visit (INDEPENDENT_AMBULATORY_CARE_PROVIDER_SITE_OTHER)
Admission: RE | Admit: 2016-07-29 | Discharge: 2016-07-29 | Disposition: A | Discharge status: Home / Self Care | Payer: Medicare Other | Source: Ambulatory Visit | Attending: Family Medicine | Admitting: Family Medicine

## 2016-07-29 DIAGNOSIS — R05 Cough: Secondary | ICD-10-CM | POA: Diagnosis not present

## 2016-07-29 DIAGNOSIS — R531 Weakness: Secondary | ICD-10-CM

## 2016-07-29 DIAGNOSIS — R059 Cough, unspecified: Secondary | ICD-10-CM

## 2016-08-03 ENCOUNTER — Other Ambulatory Visit: Payer: Self-pay | Admitting: Adult Health

## 2016-08-20 ENCOUNTER — Telehealth: Payer: Self-pay | Admitting: Neurology

## 2016-08-20 NOTE — Telephone Encounter (Signed)
I called his son Mr. Lamel Mccarley, patient continued to suffer evening time agitation, is interested in dementia with agitation trial, [name] will discuss with his family members,

## 2016-08-20 NOTE — Telephone Encounter (Signed)
Called patient for demential trial

## 2016-08-23 ENCOUNTER — Telehealth: Payer: Self-pay | Admitting: *Deleted

## 2016-08-23 ENCOUNTER — Encounter: Payer: Self-pay | Admitting: Physician Assistant

## 2016-08-23 NOTE — Telephone Encounter (Signed)
Good morning,  In the past week we've noticed increased swelling in [name] lower legs and ankles, similar to same as 6 months ago.  At that time you suggest an increased regimen of lasix over 4-5 days. This worked very well. Should we try the same regimen now? He insisted on sleeping in his chair two nights last week.    Thank you,  [name]  Daughter   Copied from [place] message 08/23/16.  08/23/16--I spoke with pt's son, [name]. [name] states gradually over the last couple of weeks pt has had increased swelling in lower legs and ankles. [name] states pt denies shortness of breath, son does not know about weight, pt denies any other symptoms. [name] states about 6 months ago pt had similar symptoms and lasix was increased to 2 of a 40mg  tablet bid for 5 days, then decreased back to current dose of 2 of a 40 mg tablet daily in the AM and 1 of a 40 mg tablet in the PM on Mon- Wed-Fri.  [name] states increasing lasix for a few days helped in the past and is  asking if pt should increase lasix for a few days now  to see if this helps LE edema. Pt advised I will forward to Dr End for review, Joellen Jersey off today.

## 2016-08-24 NOTE — Telephone Encounter (Signed)
Not having seen Mr. Hilgert in the past, it is difficult for me to make any long-term recommendations. However, based on prior notes, I think it would be reasonable to increase furosemide to 80 mg BID and have Mr. Hunger be seen in the office sometime this week for reevaluation and labs. Thanks.  Nelva Bush, MD [facility] Pager: [phone]

## 2016-08-24 NOTE — Telephone Encounter (Signed)
Patient's son [name] called back and confirmed appointment time and date. Informed him of medication changes. Patient's son verbalized understanding.

## 2016-08-24 NOTE — Telephone Encounter (Signed)
Left message with son and caregiver to call back about Dr. Darnelle Bos recommendations. Made an appointment with [name] for tomorrow at 2:00 pm

## 2016-08-25 ENCOUNTER — Encounter: Payer: Self-pay | Admitting: Physician Assistant

## 2016-08-25 ENCOUNTER — Ambulatory Visit (INDEPENDENT_AMBULATORY_CARE_PROVIDER_SITE_OTHER): Payer: Medicare Other | Admitting: Physician Assistant

## 2016-08-25 ENCOUNTER — Other Ambulatory Visit: Payer: Self-pay | Admitting: Adult Health

## 2016-08-25 VITALS — BP 96/52 | HR 57 | Ht 75.0 in | Wt 192.4 lb

## 2016-08-25 DIAGNOSIS — I5033 Acute on chronic diastolic (congestive) heart failure: Secondary | ICD-10-CM | POA: Diagnosis not present

## 2016-08-25 DIAGNOSIS — F0281 Dementia in other diseases classified elsewhere with behavioral disturbance: Secondary | ICD-10-CM

## 2016-08-25 DIAGNOSIS — I313 Pericardial effusion (noninflammatory): Secondary | ICD-10-CM

## 2016-08-25 DIAGNOSIS — R600 Localized edema: Secondary | ICD-10-CM | POA: Diagnosis not present

## 2016-08-25 DIAGNOSIS — I5032 Chronic diastolic (congestive) heart failure: Secondary | ICD-10-CM

## 2016-08-25 DIAGNOSIS — I482 Chronic atrial fibrillation, unspecified: Secondary | ICD-10-CM

## 2016-08-25 DIAGNOSIS — I3139 Other pericardial effusion (noninflammatory): Secondary | ICD-10-CM

## 2016-08-25 DIAGNOSIS — G309 Alzheimer's disease, unspecified: Principal | ICD-10-CM

## 2016-08-25 MED ORDER — METOPROLOL TARTRATE 25 MG PO TABS: 12.5000 mg | ORAL_TABLET | Freq: Two times a day (BID) | ORAL | 3 refills | Status: AC

## 2016-08-25 MED ORDER — FUROSEMIDE 40 MG PO TABS: ORAL_TABLET | ORAL | 3 refills | Status: DC

## 2016-08-25 NOTE — Patient Instructions (Addendum)
Medication Instructions:  Your physician has recommended you make the following change in your medication:  1.  INCREASE the Lasix to 40 mg taking 2 tablets by mouth twice a day X's 5 days then go down to taking 2 tablets in the morning and 1 tablet in the p.m. 2.  DECREASE the Lopressor to 25 mg taking 1/2 tablet twice a day  Labwork: TODAY:  PRO BNP & BMET  Testing/Procedures: Your physician has requested that you have an echocardiogram. Echocardiography is a painless test that uses sound waves to create images of your heart. It provides your doctor with information about the size and shape of your heart and how well your heart's chambers and valves are working. This procedure takes approximately one hour. There are no restrictions for this procedure.   Follow-Up: Your physician recommends that you schedule a follow-up appointment in: 09/07/16 ARRIVE AT 2:45 TO SEE KATIE THOMPSON   Any Other Special Instructions Will Be Listed Below (If Applicable).   Echocardiogram An echocardiogram, or echocardiography, uses sound waves (ultrasound) to produce an image of your heart. The echocardiogram is simple, painless, obtained within a short period of time, and offers valuable information to your health care provider. The images from an echocardiogram can provide information such as:  Evidence of coronary artery disease (CAD).  Heart size.  Heart muscle function.  Heart valve function.  Aneurysm detection.  Evidence of a past heart attack.  Fluid buildup around the heart.  Heart muscle thickening.  Assess heart valve function.  Tell a health care provider about:  Any allergies you have.  All medicines you are taking, including vitamins, herbs, eye drops, creams, and over-the-counter medicines.  Any problems you or family members have had with anesthetic medicines.  Any blood disorders you have.  Any surgeries you have had.  Any medical conditions you have.  Whether you  are pregnant or may be pregnant. What happens before the procedure? No special preparation is needed. Eat and drink normally. What happens during the procedure?  In order to produce an image of your heart, gel will be applied to your chest and a wand-like tool (transducer) will be moved over your chest. The gel will help transmit the sound waves from the transducer. The sound waves will harmlessly bounce off your heart to allow the heart images to be captured in real-time motion. These images will then be recorded.  You may need an IV to receive a medicine that improves the quality of the pictures. What happens after the procedure? You may return to your normal schedule including diet, activities, and medicines, unless your health care provider tells you otherwise. This information is not intended to replace advice given to you by your health care provider. Make sure you discuss any questions you have with your health care provider. Document Released: 03/05/2000 Document Revised: 10/25/2015 Document Reviewed: 11/13/2012 Elsevier Interactive Patient Education  2017 [employer].   If you need a refill on your cardiac medications before your next appointment, please call your pharmacy.

## 2016-08-25 NOTE — Progress Notes (Addendum)
Cardiology Office Note    Date:  08/26/2016   ID:  [name], DOB 1927-08-14, MRN [id number]  PCP:  [name], NP  Cardiologist:  [name] --> will establish with [name]  CC: LE edema.   History of Present Illness:  [name] is a 83 y.o. male with a history of bladder cancer, DM, GERD, chronic atrial fibrillation (not on [place] 2/2 GI bleeds), pleural effusion and pericardial effusion s/p pericardiocentesis and dementia who presents to clinic for evaluation of LE edema.   He was followed [name] with known history of chronic atrial fibrillation. He is not on Coumadin because of recurrent GI bleeds. He had a cardiac catheterization on 08/07/01 showing normal coronary arteries and an ejection fraction of 60-65%. He had an echocardiogram on 04/21/11 showing an ejection fraction of 60-65%, moderate mitral regurgitation, mild aortic insufficiency, moderate tricuspid regurgitation, and pulmonary artery pressure was 46 mmHg.Previous notes state that hey had chronic mild pretibial edema as well as chronic mild pulmonary vascular congestion with basilar rales.He had a Lexiscan Myoview on 01/23/13 showing no ischemia.  He was admitted in 12/2014 for sepsis, acute respiratory failure, and suspected PNA.TTE revealed normal LVEF with a large circumferential effusion w/o tamponade and pleural effusion. He underwent a pericardiocentesis and thoracentesis. Workup per infectious disease for pericardial effusion/pleural effusion was unremarkable. Pleural effusion was feltto be transudative. Repeat 2D echo showed an EF of 45-50% with small to moderate pericardial effusion and then repeat limited echo in 02/2016 showed trivial pericardial effusion. He had been taking Furosemide 80mg  QD and takes an additional 40mg  on MWF.   I saw him in clinic in 04/2016 for worsening LE edema. His son was with him and noted him to be slowing down. His BP was soft. I decreased Lopressor 25mg  from 4x a day  to 2x a day. BNP was elevated ~1900. I increased lasix to 80mg  BID for 5 days and then went back to regular dosing. Follow up labs on 05/27/16 were stable.   He has been evaluated by Dr. Elease Hashimoto and Beaulah Dinning NP since that time for weakness and slowing down. He was found to have a UTI that was treated with Cipro. Blood counts have been stable.   Today he presents to clinic for evaluation of worsening LE edema. Patient has advanced dementia and does not provide much history. Son is here and says he has had worsening LE edema and has been very weak. He also has been sleeping in his recliner a lot. No other complaints, but history is somewhat limited.     Past Medical History:  Diagnosis Date  . Anemia    iron deficiency secondary to bleeding  . Arrhythmia    a fib  . Arthritis    DJD knee - left  . Bladder cancer (HCC)    [facility] [name]  . Diabetes mellitus    NIDDM on oral medication  . ED (erectile dysfunction)   . Genital warts    recurrent. Not a problem  . GERD (gastroesophageal reflux disease)    treated with PPI  . Hard of hearing   . Hematochezia    has intermittent problem  . Hypertension    mild  . Irritable bladder   . Memory disorder   . Neuropathy    burning discomfort feet/legs  . Restless leg    treats with heat    Past Surgical History:  Procedure Laterality Date  . APPENDECTOMY  1946  .  CARDIAC CATHETERIZATION N/A 01/13/2015   Procedure: Pericardiocentesis;  Surgeon: Leonie Man, MD;  Location: [facility];  Service: Cardiovascular;  Laterality: N/A;  . CARDIOVASCULAR STRESS TEST  03/28/2003   EF 55%.  Normal cardiolite study  . JOINT REPLACEMENT  1990's   left TKR  . KNEE ARTHROSCOPY  1980's   right knee  . TONSILLECTOMY  1938    Current Medications: Outpatient Medications Prior to Visit  Medication Sig Dispense Refill  . aspirin 81 MG tablet Take 81 mg by mouth daily.    . ferrous sulfate 325 (65 FE) MG tablet Take 325 mg by  mouth 2 (two) times daily.     . folic acid (FOLVITE) 1 MG tablet Take 1 mg by mouth 2 (two) times daily.     . haloperidol (HALDOL) 0.5 MG tablet Take one tablet daily as needed. 30 tablet 0  . hydrOXYzine (ATARAX/VISTARIL) 10 MG tablet TAKE (1) TABLET TWICE A DAY AS NEEDED. 120 tablet 0  . Memantine HCl-Donepezil HCl (NAMZARIC) 28-10 MG CP24 Take 1 tablet by mouth every morning. 30 capsule 11  . Multiple Vitamin (MULTIVITAMIN WITH MINERALS) TABS tablet Take 1 tablet by mouth daily.    . Multiple Vitamins-Minerals (PRESERVISION AREDS 2 PO) Take 1 tablet by mouth 2 (two) times daily.     [name] MYRBETRIQ 50 MG TB24 tablet TAKE 1 TABLET ONCE DAILY. 60 tablet 0  . nitroGLYCERIN (NITROSTAT) 0.4 MG SL tablet Place 1 tablet (0.4 mg total) under the tongue every 5 (five) minutes as needed for chest pain (x 3 doses daily). 30 tablet 3  . furosemide (LASIX) 40 MG tablet TAKE 2 TABLETS BY MOUTH TWICE DAY X'S 5 DAYS THEN GO BACK TO 2 TABLETS BY MOUTH IN THE A.M. AND 1 TABLET BY MOUTH IN THE P.M MONDAYS, WEDNESDAYS, & FRIDAYS 280 tablet 3  . LORazepam (ATIVAN) 0.5 MG tablet TAKE 1 TABLET TWICE DAILY AS NEEDED FOR ANXIETY. 60 tablet 0  . metoprolol tartrate (LOPRESSOR) 25 MG tablet Take 1 tablet (25 mg total) by mouth 2 (two) times daily. 180 tablet 3   No facility-administered medications prior to visit.      Allergies:   Cephalexin; Furacin [nitrofurazone]; Macrodantin; and Zestril [lisinopril]   Social History   Social History  . Marital status: Married    Spouse name: [name]  . Number of children: 4  . Years of education: 10th    Occupational History  . retired Retired   Social History Main Topics  . Smoking status: Former Smoker    Quit date: 08/28/1975  . Smokeless tobacco: Former Systems developer  . Alcohol use No  . Drug use: No  . Sexual activity: Not Currently   Other Topics Concern  . None   Social History Narrative   Patient lives at home with his wife [name]) Married 35 years.   Retired    Education 10 th grade   Right handed   Caffeine one or two daily      Family History:  The patient's family history includes Cancer in his father; Heart disease in his mother; Kidney disease in his mother; Mental illness in his brother; Stroke in his mother.      ROS:   Please see the history of present illness.    ROS All other systems reviewed and are negative.   PHYSICAL EXAM:   VS:  BP (!) 96/52   Pulse (!) 57   Ht 6\' 3"  (1.905 m)   Wt 192 lb  6.4 oz (87.3 kg)   BMI 24.05 kg/m    GEN: Well nourished, well developed, in no acute distress, elderly and frail in a wheelchair HEENT: normal  Neck: no JVD, carotid bruits, or masses Cardiac: irreg irreg; no murmurs, rubs, or gallops, 2+ LE edema to knees Respiratory:  clear to auscultation bilaterally, normal work of breathing GI: soft, nontender, nondistended, + BS MS: no deformity or atrophy  Skin: warm and dry, no rash Neuro:  Alert and Oriented x 3, Strength and sensation are intact Psych: euthymic mood, full affect    Wt Readings from Last 3 Encounters:  08/25/16 192 lb 6.4 oz (87.3 kg)  06/25/16 184 lb (83.5 kg)  06/08/16 175 lb (79.4 kg)      Studies/Labs Reviewed:   EKG:  EKG is ordered today.  The ekg ordered today demonstrates afib with slow VR HR 57  Recent Labs: 01/21/2016: Magnesium 2.1 02/18/2016: TSH 2.71 05/27/2016: Hemoglobin 12.5; Platelets 114.0 08/25/2016: BUN 20; Creatinine, Ser 0.94; NT-Pro BNP 1,266; Potassium 4.1; Sodium 144   Lipid Panel    Component Value Date/Time   CHOL 125 03/20/2015 1259   TRIG 66 03/20/2015 1259   HDL 48 03/20/2015 1259   CHOLHDL 2.6 03/20/2015 1259   VLDL 13 03/20/2015 1259   LDLCALC 64 03/20/2015 1259    Additional studies/ records that were reviewed today include:  2D ECHO: 01/11/2015 LV EF: 55% - 60% Study Conclusions - Left ventricle: The cavity size was normal. Wall thickness was normal. Systolic function was normal. The estimated ejection fraction  was in the range of 55% to 60%. Wall motion was normal; there were no regional wall motion abnormalities. - Aortic valve: There was mild regurgitation. - Mitral valve: Calcified annulus. - Left atrium: The atrium was severely dilated. - Right atrium: The atrium was severely dilated. - Atrial septum: No defect or patent foramen ovale was identified. - Pulmonary arteries: PA peak pressure: 32 mm Hg (S). - Pericardium, extracardiac: A large, free-flowing pericardial effusion was identified circumferential to the heart. The fluid contained focal strands. Impressions: - There is a very large pericardial effusion. The presence of atrial fibrillation and previously diagnosed pulmonary artery HTN limits the sensitivity of echo for the diagnosis of tamponade. Frank chamber collapse is not seen. The inferior vena cava is plethoric, consistent with severely elevated central venous pressure. The pericardial effusion is new since the previous study in 2015.   Limited ECHO: 03/11/2015 Study Conclusions - Pericardium, extracardiac: A trivial pericardial effusion was   identified. There was a right pleural effusion. There was a left   pleural effusion.    ASSESSMENT & PLAN:   Acute on chronic diastolic CHF: will check BMET and BNP and update 2D ECHO. Will increase Lasix to 80mg  BID x 5 days and then go back to 80mg  in AM and 40mg  in PM daily ( used to be 80 in AM and 40mg  in PM on M, W, F).   Chronic atrial fibrillation: BP is soft today and HR in 50s. I will decrease Lopressor from 25mg  BID --> 12.5mg  BID. Not on oral anticoagulation given history of GI bleeds  Dementia: chronic and progressive. He has 24 hour home care. He remains a full code   History of pericardial effusion: will update 2D ECHO as above.  Weakness: this has been ongoing. Followed closely by Dr. Elease Hashimoto and [name]. Will lower Lopressor from 25mg  BID to 12.5mg  BID given soft BP and low HRs.      Medication  Adjustments/Labs and Tests Ordered: Current medicines are reviewed at length with the patient today.  Concerns regarding medicines are outlined above.  Medication changes, Labs and Tests ordered today are listed in the Patient Instructions below. Patient Instructions  Medication Instructions:  Your physician has recommended you make the following change in your medication:  1.  INCREASE the Lasix to 40 mg taking 2 tablets by mouth twice a day X's 5 days then go down to taking 2 tablets in the morning and 1 tablet in the p.m. 2.  DECREASE the Lopressor to 25 mg taking 1/2 tablet twice a day  Labwork: TODAY:  PRO BNP & BMET  Testing/Procedures: Your physician has requested that you have an echocardiogram. Echocardiography is a painless test that uses sound waves to create images of your heart. It provides your doctor with information about the size and shape of your heart and how well your heart's chambers and valves are working. This procedure takes approximately one hour. There are no restrictions for this procedure.   Follow-Up: Your physician recommends that you schedule a follow-up appointment in: 09/07/16 ARRIVE AT 2:45 TO SEE KATIE THOMPSON   Any Other Special Instructions Will Be Listed Below (If Applicable).   Echocardiogram An echocardiogram, or echocardiography, uses sound waves (ultrasound) to produce an image of your heart. The echocardiogram is simple, painless, obtained within a short period of time, and offers valuable information to your health care provider. The images from an echocardiogram can provide information such as:  Evidence of coronary artery disease (CAD).  Heart size.  Heart muscle function.  Heart valve function.  Aneurysm detection.  Evidence of a past heart attack.  Fluid buildup around the heart.  Heart muscle thickening.  Assess heart valve function.  Tell a health care provider about:  Any allergies you have.  All  medicines you are taking, including vitamins, herbs, eye drops, creams, and over-the-counter medicines.  Any problems you or family members have had with anesthetic medicines.  Any blood disorders you have.  Any surgeries you have had.  Any medical conditions you have.  Whether you are pregnant or may be pregnant. What happens before the procedure? No special preparation is needed. Eat and drink normally. What happens during the procedure?  In order to produce an image of your heart, gel will be applied to your chest and a wand-like tool (transducer) will be moved over your chest. The gel will help transmit the sound waves from the transducer. The sound waves will harmlessly bounce off your heart to allow the heart images to be captured in real-time motion. These images will then be recorded.  You may need an IV to receive a medicine that improves the quality of the pictures. What happens after the procedure? You may return to your normal schedule including diet, activities, and medicines, unless your health care provider tells you otherwise. This information is not intended to replace advice given to you by your health care provider. Make sure you discuss any questions you have with your health care provider. Document Released: 03/05/2000 Document Revised: 10/25/2015 Document Reviewed: 11/13/2012 Elsevier Interactive Patient Education  2017 [employer].   If you need a refill on your cardiac medications before your next appointment, please call your pharmacy.      Mable Fill, PA-C  08/26/2016 11:42 AM    [facility] [place], [place],   [address] Phone: [phone]; Fax: [phone]

## 2016-08-25 NOTE — Telephone Encounter (Signed)
Last refill 07/27/16 and last office visit was with Dr Elease Hashimoto 07/28/16.  Okay to fill?

## 2016-08-25 NOTE — Telephone Encounter (Signed)
Ok to refill 

## 2016-08-26 LAB — BASIC METABOLIC PANEL
BUN/Creatinine Ratio: 21 (ref 10–24)
BUN: 20 mg/dL (ref 8–27)
CALCIUM: 8.9 mg/dL (ref 8.6–10.2)
CO2: 29 mmol/L (ref 18–29)
Chloride: 102 mmol/L (ref 96–106)
Creatinine, Ser: 0.94 mg/dL (ref 0.76–1.27)
GFR, EST AFRICAN AMERICAN: 84 mL/min/{1.73_m2} (ref >59)
GFR, EST NON AFRICAN AMERICAN: 73 mL/min/{1.73_m2} (ref >59)
Glucose: 90 mg/dL (ref 65–99)
POTASSIUM: 4.1 mmol/L (ref 3.5–5.2)
Sodium: 144 mmol/L (ref 134–144)

## 2016-08-26 LAB — PRO B NATRIURETIC PEPTIDE: NT-Pro BNP: 1266 pg/mL — ABNORMAL HIGH (ref 0–486)

## 2016-08-28 ENCOUNTER — Other Ambulatory Visit: Payer: Self-pay | Admitting: Adult Health

## 2016-08-30 ENCOUNTER — Ambulatory Visit (HOSPITAL_COMMUNITY): Payer: Medicare Other | Attending: Cardiology

## 2016-08-30 ENCOUNTER — Other Ambulatory Visit: Payer: Self-pay

## 2016-08-30 DIAGNOSIS — I083 Combined rheumatic disorders of mitral, aortic and tricuspid valves: Secondary | ICD-10-CM | POA: Diagnosis not present

## 2016-08-30 DIAGNOSIS — I420 Dilated cardiomyopathy: Secondary | ICD-10-CM | POA: Insufficient documentation

## 2016-08-30 DIAGNOSIS — I4891 Unspecified atrial fibrillation: Secondary | ICD-10-CM | POA: Insufficient documentation

## 2016-08-30 DIAGNOSIS — I5033 Acute on chronic diastolic (congestive) heart failure: Secondary | ICD-10-CM | POA: Diagnosis not present

## 2016-08-30 NOTE — Telephone Encounter (Signed)
Ok to refill + 1  

## 2016-09-01 ENCOUNTER — Telehealth: Payer: Self-pay | Admitting: Neurology

## 2016-09-01 NOTE — Telephone Encounter (Addendum)
Patient is accompanied by his caregiver Tonye Pearson today's Visit for dementia trial with agitations, she has taken care of patient since 2016, patient's and his wife lives at home, have caregiver 24x7.  Wife will be identified as the main caregiver in the research trial

## 2016-09-03 ENCOUNTER — Telehealth: Payer: Self-pay | Admitting: Neurology

## 2016-09-03 MED ORDER — QUETIAPINE FUMARATE 25 MG PO TABS: 25.0000 mg | ORAL_TABLET | Freq: Every evening | ORAL | 6 refills | Status: DC | PRN

## 2016-09-03 NOTE — Telephone Encounter (Signed)
I reviewed the laboratory evaluations, elevated folic acid 40, mild elevated alkaline phosphate 167, INR 1.2 hemoglobin 12, A1c 5.2, UDS was negative, HIV, hepatitis B antigen and Hepatitis C antibody was negative,

## 2016-09-03 NOTE — Addendum Note (Signed)
Addended by: Marcial Pacas on: 09/03/2016 08:19 PM   Modules accepted: Orders

## 2016-09-03 NOTE — Telephone Encounter (Signed)
I have called his son, he is not a good research candidate due to prolonged QT interval, bundle block,  He use Haldol as needed occasionally for evening time agitation, I will change to seroquel 25 mg as needed

## 2016-09-06 NOTE — Progress Notes (Addendum)
Cardiology Office Note    Date:  09/07/2016   ID:  [name], DOB 1927-05-11, MRN [id number]  PCP:  [name], NP  Cardiologist: [name] --> will establish with [name]  CC: follow up   History of Present Illness:  [name] is a 83 y.o. male with a history of bladder cancer, DM, GERD, chronic atrial fibrillation (not on Lemay 2/2 GI bleeds), pleural effusion and pericardial effusion s/p pericardiocentesis and dementia who presents to clinic for follow up.   He was followed [name] with known history of chronic atrial fibrillation. He is not on Coumadin because of recurrent GI bleeds. He had a cardiac catheterization on 08/07/01 showing normal coronary arteries and an ejection fraction of 60-65%. He had an echocardiogram on 04/21/11 showing an ejection fraction of 60-65%, moderate mitral regurgitation, mild aortic insufficiency, moderate tricuspid regurgitation, and pulmonary artery pressure was 46 mmHg.Previous notes state that hey had chronic mild pretibial edema as well as chronic mild pulmonary vascular congestion with basilar rales.He had a Lexiscan Myoview on 01/23/13 showing no ischemia.  He was admitted in 12/2014 for sepsis, acute respiratory failure, and suspected PNA.TTE revealed normal LVEF with a large circumferential effusion w/o tamponade and pleural effusion. He underwent a pericardiocentesis and thoracentesis. Workup per infectious disease for pericardial effusion/pleural effusion was unremarkable. Pleural effusion was feltto be transudative. Repeat 2D echo showed an EF of 45-50% with small to moderate pericardial effusion and then repeat limited echo in 02/2016 showed trivial pericardial effusion. He had been taking Furosemide 80mg  QD and takes an additional 40mg  on MWF.   I saw him in clinic in 04/2016 for worsening LE edema. His son was with him and noted him to be slowing down. His BP was soft. I decreased Lopressor 25mg  from 4x a day to 2x a day.  BNP was elevated ~1900. I increased lasix to 80mg  BID for 5 days and then went back to regular dosing. Follow up labs on 05/27/16 were stable.   He has been evaluated by Dr. Elease Hashimoto and Beaulah Dinning NP since that time for weakness and slowing down. He was found to have a UTI that was treated with Cipro. Blood counts have been stable.   I saw him in clinic on 08/25/16 for recurrent worsening of LE edema and weakness. I increased his lasix 80mg  BID x 5 days and then back down to 80mg  AM/ 40mg  PM daily (instead of 40mg  in PM on M/W/Fri). I decreased Lopressor from 25mg  to 12.5mg  BID given low BP/HR and weakness. I also updated a 2D ECHO which showed EF 45-50%, mild AR, mild MR, severe LAE, mod RV dilation, mild RV systolic dysfunction, mod TR, PA pressure 39.   Today he presents to clinic for follow up. Patient here with son and wife. History limited from him due to dementia, but wife and son say he has much more energy and acting a lot more normal. No CP or SOB. Back sleeping in his bed instead of a recliner. He has lost 5 lbs.    Past Medical History:  Diagnosis Date  . Anemia    iron deficiency secondary to bleeding  . Arrhythmia    a fib  . Arthritis    DJD knee - left  . Bladder cancer (HCC)    BCG [name]  . Diabetes mellitus    NIDDM on oral medication  . ED (erectile dysfunction)   . Genital warts    recurrent. Not a  problem  . GERD (gastroesophageal reflux disease)    treated with PPI  . Hard of hearing   . Hematochezia    has intermittent problem  . Hypertension    mild  . Irritable bladder   . Memory disorder   . Neuropathy    burning discomfort feet/legs  . Restless leg    treats with heat    Past Surgical History:  Procedure Laterality Date  . APPENDECTOMY  1946  . CARDIAC CATHETERIZATION N/A 01/13/2015   Procedure: Pericardiocentesis;  Surgeon: Leonie Man, MD;  Location: [facility];  Service: Cardiovascular;  Laterality: N/A;  .  CARDIOVASCULAR STRESS TEST  03/28/2003   EF 55%.  Normal cardiolite study  . JOINT REPLACEMENT  1990's   left TKR  . KNEE ARTHROSCOPY  1980's   right knee  . TONSILLECTOMY  1938    Current Medications: Outpatient Medications Prior to Visit  Medication Sig Dispense Refill  . aspirin 81 MG tablet Take 81 mg by mouth daily.    . ferrous sulfate 325 (65 FE) MG tablet Take 325 mg by mouth 2 (two) times daily.     . folic acid (FOLVITE) 1 MG tablet Take 1 mg by mouth 2 (two) times daily.     . furosemide (LASIX) 40 MG tablet TAKE 2 TABLETS BY MOUTH TWICE DAY X'S 5 DAYS THEN GO BACK TO 2 TABLETS BY MOUTH IN THE A.M. AND 1 TABLET BY MOUTH IN THE P.M DAILY 280 tablet 3  . haloperidol (HALDOL) 0.5 MG tablet Take one tablet daily as needed. 30 tablet 0  . hydrOXYzine (ATARAX/VISTARIL) 10 MG tablet TAKE (1) TABLET TWICE A DAY AS NEEDED. 120 tablet 1  . LORazepam (ATIVAN) 0.5 MG tablet TAKE 1 TABLET TWICE DAILY AS NEEDED FOR ANXIETY. 60 tablet 0  . Memantine HCl-Donepezil HCl (NAMZARIC) 28-10 MG CP24 Take 1 tablet by mouth every morning. 30 capsule 11  . metoprolol tartrate (LOPRESSOR) 25 MG tablet Take 0.5 tablets (12.5 mg total) by mouth 2 (two) times daily. 90 tablet 3  . Multiple Vitamin (MULTIVITAMIN WITH MINERALS) TABS tablet Take 1 tablet by mouth daily.    . Multiple Vitamins-Minerals (PRESERVISION AREDS 2 PO) Take 1 tablet by mouth 2 (two) times daily.     [name] MYRBETRIQ 50 MG TB24 tablet TAKE 1 TABLET ONCE DAILY. 60 tablet 0  . nitroGLYCERIN (NITROSTAT) 0.4 MG SL tablet Place 1 tablet (0.4 mg total) under the tongue every 5 (five) minutes as needed for chest pain (x 3 doses daily). 30 tablet 3  . QUEtiapine (SEROQUEL) 25 MG tablet Take 1 tablet (25 mg total) by mouth at bedtime as needed. 30 tablet 6   No facility-administered medications prior to visit.      Allergies:   Cephalexin; Furacin [nitrofurazone]; Macrodantin; and Zestril [lisinopril]   Social History   Social History  . Marital  status: Married    Spouse name: [name]  . Number of children: 4  . Years of education: 10th    Occupational History  . retired Retired   Social History Main Topics  . Smoking status: Former Smoker    Quit date: 08/28/1975  . Smokeless tobacco: Former Systems developer  . Alcohol use No  . Drug use: No  . Sexual activity: Not Currently   Other Topics Concern  . None   Social History Narrative   Patient lives at home with his wife [name]) Married 61 years.   Retired   Education 10 th grade  Right handed   Caffeine one or two daily      Family History:  The patient's family history includes Cancer in his father; Heart disease in his mother; Kidney disease in his mother; Mental illness in his brother; Stroke in his mother.      ROS:   Please see the history of present illness.    ROS All other systems reviewed and are negative.   PHYSICAL EXAM:   VS:  BP 118/68   Pulse 74   Ht 6\' 1"  (1.854 m)   Wt 187 lb 12.8 oz (85.2 kg)   BMI 24.78 kg/m    GEN: Well nourished, well developed, in no acute distress elderly and frail man in a wheelchair  HEENT: normal  Neck: no JVD, carotid bruits, or masses Cardiac: irreg irreg; no murmurs, rubs, or gallops, 1+ LE edema to shins Respiratory:  clear to auscultation bilaterally, normal work of breathing GI: soft, nontender, nondistended, + BS MS: no deformity or atrophy  Skin: warm and dry, no rash Neuro:  Alert and Oriented x 3, Strength and sensation are intact Psych: euthymic mood, full affect    Wt Readings from Last 3 Encounters:  09/07/16 187 lb 12.8 oz (85.2 kg)  08/25/16 192 lb 6.4 oz (87.3 kg)  06/25/16 184 lb (83.5 kg)      Studies/Labs Reviewed:   EKG:  EKG is NOT ordered today.    Recent Labs: 01/21/2016: Magnesium 2.1 02/18/2016: TSH 2.71 05/27/2016: Hemoglobin 12.5; Platelets 114.0 08/25/2016: BUN 20; Creatinine, Ser 0.94; NT-Pro BNP 1,266; Potassium 4.1; Sodium 144   Lipid Panel    Component Value Date/Time   CHOL 125  03/20/2015 1259   TRIG 66 03/20/2015 1259   HDL 48 03/20/2015 1259   CHOLHDL 2.6 03/20/2015 1259   VLDL 13 03/20/2015 1259   LDLCALC 64 03/20/2015 1259    Additional studies/ records that were reviewed today include:  2D ECHO: 01/11/2015 LV EF: 55% - 60% Study Conclusions - Left ventricle: The cavity size was normal. Wall thickness was normal. Systolic function was normal. The estimated ejection fraction was in the range of 55% to 60%. Wall motion was normal; there were no regional wall motion abnormalities. - Aortic valve: There was mild regurgitation. - Mitral valve: Calcified annulus. - Left atrium: The atrium was severely dilated. - Right atrium: The atrium was severely dilated. - Atrial septum: No defect or patent foramen ovale was identified. - Pulmonary arteries: PA peak pressure: 32 mm Hg (S). - Pericardium, extracardiac: A large, free-flowing pericardial effusion was identified circumferential to the heart. The fluid contained focal strands. Impressions: - There is a very large pericardial effusion. The presence of atrial fibrillation and previously diagnosed pulmonary artery HTN limits the sensitivity of echo for the diagnosis of tamponade. Frank chamber collapse is not seen. The inferior vena cava is plethoric, consistent with severely elevated central venous pressure. The pericardial effusion is new since the previous study in 2015.   Limited ECHO: 03/11/2015 Study Conclusions - Pericardium, extracardiac: A trivial pericardial effusion was identified. There was a right pleural effusion. There was a left pleural effusion.   2D ECHO: 08/30/2016 LV EF: 45% -   50% Study Conclusions - Left ventricle: The cavity size was normal. Systolic function was   mildly reduced. The estimated ejection fraction was in the range   of 45% to 50%. Wall motion was normal; there were no regional   wall motion abnormalities. The study was not  technically   sufficient to  allow evaluation of LV diastolic dysfunction due to   atrial fibrillation. - Ventricular septum: Septal motion showed paradox. - Aortic valve: There was mild regurgitation. - Mitral valve: Calcified annulus. Mildly thickened leaflets .   There was mild regurgitation. - Left atrium: The atrium was severely dilated. - Right ventricle: The cavity size was moderately dilated. Wall   thickness was normal. Systolic function was mildly reduced. - Right atrium: The atrium was severely dilated. - Tricuspid valve: There was moderate regurgitation. - Pulmonary arteries: Systolic pressure was mildly increased. PA   peak pressure: 39 mm Hg (S). - Inferior vena cava: The vessel was dilated. The respirophasic   diameter changes were blunted (< 50%), consistent with elevated   central venous pressure.    ASSESSMENT & PLAN:   Acute on chronic combined S/D CHF: EF 45-50%. Weight down 5 lbs and LE edema much improved. Will continue lasix 80mg  in AM and 40mg  in PM. WIll check a BMET today.   Chronic atrial fibrillation: rate well controlled on Lopressor 12.5mg  BID. Not on [facility] given history of GI bleeding. CHADSVASC of at least 4 (CHF, HTN, age)  Dementia: chronic and progressive. He has 24 hour home care.   History of pericardial effusion: no pericardial effusion on most recent echo.   Weakness: this is much improved. HR and BP now in normal range on lower dose of BB   Medication Adjustments/Labs and Tests Ordered: Current medicines are reviewed at length with the patient today.  Concerns regarding medicines are outlined above.  Medication changes, Labs and Tests ordered today are listed in the Patient Instructions below. Patient Instructions  Medication Instructions:  Your physician recommends that you continue on your current medications as directed. Please refer to the Current Medication list given to you today.   Labwork: TODAY:  BMET  Testing/Procedures: None  ordered  Follow-Up: Your physician recommends that you schedule a follow-up appointment in: [place] DR. END   Any Other Special Instructions Will Be Listed Below (If Applicable).     If you need a refill on your cardiac medications before your next appointment, please call your pharmacy.      Signed, Angelena Form, PA-C  09/07/2016 3:28 PM    [facility] [place], [place], [place]  [address] Phone: [phone]; Fax: [phone]

## 2016-09-07 ENCOUNTER — Encounter: Payer: Self-pay | Admitting: Physician Assistant

## 2016-09-07 ENCOUNTER — Ambulatory Visit (INDEPENDENT_AMBULATORY_CARE_PROVIDER_SITE_OTHER): Payer: Medicare Other | Admitting: Physician Assistant

## 2016-09-07 ENCOUNTER — Other Ambulatory Visit (HOSPITAL_COMMUNITY): Payer: Medicare Other

## 2016-09-07 VITALS — BP 118/68 | HR 74 | Ht 73.0 in | Wt 187.8 lb

## 2016-09-07 DIAGNOSIS — G309 Alzheimer's disease, unspecified: Secondary | ICD-10-CM | POA: Diagnosis not present

## 2016-09-07 DIAGNOSIS — I3139 Other pericardial effusion (noninflammatory): Secondary | ICD-10-CM

## 2016-09-07 DIAGNOSIS — I313 Pericardial effusion (noninflammatory): Secondary | ICD-10-CM | POA: Diagnosis not present

## 2016-09-07 DIAGNOSIS — I482 Chronic atrial fibrillation, unspecified: Secondary | ICD-10-CM

## 2016-09-07 DIAGNOSIS — I5033 Acute on chronic diastolic (congestive) heart failure: Secondary | ICD-10-CM

## 2016-09-07 DIAGNOSIS — R531 Weakness: Secondary | ICD-10-CM

## 2016-09-07 DIAGNOSIS — F0280 Dementia in other diseases classified elsewhere without behavioral disturbance: Secondary | ICD-10-CM

## 2016-09-07 NOTE — Patient Instructions (Signed)
Medication Instructions:  Your physician recommends that you continue on your current medications as directed. Please refer to the Current Medication list given to you today.   Labwork: TODAY:  BMET  Testing/Procedures: None ordered  Follow-Up: Your physician recommends that you schedule a follow-up appointment in: [place] DR. END   Any Other Special Instructions Will Be Listed Below (If Applicable).     If you need a refill on your cardiac medications before your next appointment, please call your pharmacy.

## 2016-09-08 ENCOUNTER — Telehealth: Payer: Self-pay | Admitting: *Deleted

## 2016-09-08 LAB — BASIC METABOLIC PANEL
BUN / CREAT RATIO: 23 (ref 10–24)
BUN: 21 mg/dL (ref 8–27)
CHLORIDE: 102 mmol/L (ref 96–106)
CO2: 30 mmol/L — AB (ref 20–29)
Calcium: 9.4 mg/dL (ref 8.6–10.2)
Creatinine, Ser: 0.9 mg/dL (ref 0.76–1.27)
GFR calc Af Amer: 88 mL/min/{1.73_m2} (ref >59)
GFR calc non Af Amer: 77 mL/min/{1.73_m2} (ref >59)
Glucose: 98 mg/dL (ref 65–99)
POTASSIUM: 4.2 mmol/L (ref 3.5–5.2)
SODIUM: 144 mmol/L (ref 134–144)

## 2016-09-08 NOTE — Telephone Encounter (Signed)
PA for generic Seroquel approved by [facility] (PT [phone], BIN/PCN [id number]/[id number]).  Effective through 09/08/17.  Pharmacy notified and will fill prescription for the patient.

## 2016-09-24 ENCOUNTER — Other Ambulatory Visit: Payer: Self-pay | Admitting: Adult Health

## 2016-09-24 DIAGNOSIS — G309 Alzheimer's disease, unspecified: Principal | ICD-10-CM

## 2016-09-24 DIAGNOSIS — F0281 Dementia in other diseases classified elsewhere with behavioral disturbance: Secondary | ICD-10-CM

## 2016-09-24 NOTE — Telephone Encounter (Signed)
Ok to refill 

## 2016-09-24 NOTE — Telephone Encounter (Signed)
Rx has been called in as directed.  

## 2016-10-04 ENCOUNTER — Encounter: Payer: Self-pay | Admitting: *Deleted

## 2016-10-06 ENCOUNTER — Ambulatory Visit (INDEPENDENT_AMBULATORY_CARE_PROVIDER_SITE_OTHER): Payer: Medicare Other | Admitting: Adult Health

## 2016-10-06 VITALS — BP 90/40 | HR 85 | Temp 98.8°F

## 2016-10-06 DIAGNOSIS — R5383 Other fatigue: Secondary | ICD-10-CM | POA: Diagnosis not present

## 2016-10-06 DIAGNOSIS — F0391 Unspecified dementia with behavioral disturbance: Secondary | ICD-10-CM

## 2016-10-06 LAB — POCT URINALYSIS DIPSTICK
Bilirubin, UA: NEGATIVE
Glucose, UA: NEGATIVE
Ketones, UA: NEGATIVE
Leukocytes, UA: NEGATIVE
NITRITE UA: NEGATIVE
PH UA: 7 (ref 5.0–8.0)
Protein, UA: NEGATIVE
RBC UA: NEGATIVE
Spec Grav, UA: 1.01 (ref 1.010–1.025)
UROBILINOGEN UA: 0.2 U/dL

## 2016-10-06 NOTE — Progress Notes (Signed)
Subjective:    Patient ID: [name], male    DOB: 04/09/1927, 83 y.o.   MRN: [id number]  HPI  83 year old male who  has a past medical history of Anemia; Arrhythmia; Arthritis; Bladder cancer ([employer]); Diabetes mellitus; ED (erectile dysfunction); Genital warts; GERD (gastroesophageal reflux disease); Hard of hearing; Hematochezia; Hypertension; Irritable bladder; Memory disorder; Neuropathy; and Restless leg. He presents with his son [name] for the acute on chronic complaint of " he seems like he is slower than he normally is. He is talking less and not walking as much."   Family denies any fevers or recent medication changes. Neurology has started him on Seroquel PRN as needed for agitation, but he has not had to take it yet. He has not taken any ativan as of recently either.   Family denies any fevers or complaints of CP or SOB  Review of Systems  Unable to perform ROS: Dementia   Past Medical History:  Diagnosis Date  . Anemia    iron deficiency secondary to bleeding  . Arrhythmia    a fib  . Arthritis    DJD knee - left  . Bladder cancer (HCC)    BCG [name]  . Diabetes mellitus    NIDDM on oral medication  . ED (erectile dysfunction)   . Genital warts    recurrent. Not a problem  . GERD (gastroesophageal reflux disease)    treated with PPI  . Hard of hearing   . Hematochezia    has intermittent problem  . Hypertension    mild  . Irritable bladder   . Memory disorder   . Neuropathy    burning discomfort feet/legs  . Restless leg    treats with heat    Social History   Social History  . Marital status: Married    Spouse name: [name]  . Number of children: 4  . Years of education: 10th    Occupational History  . retired Retired   Social History Main Topics  . Smoking status: Former Smoker    Quit date: 08/28/1975  . Smokeless tobacco: Former Systems developer  . Alcohol use No  . Drug use: No  . Sexual activity: Not Currently   Other Topics Concern  .  Not on file   Social History Narrative   Patient lives at home with his wife [name]) Married 63 years.   Retired   Education 10 th grade   Right handed   Caffeine one or two daily     Past Surgical History:  Procedure Laterality Date  . APPENDECTOMY  1946  . CARDIAC CATHETERIZATION N/A 01/13/2015   Procedure: Pericardiocentesis;  Surgeon: Leonie Man, MD;  Location: [place] [facility];  Service: Cardiovascular;  Laterality: N/A;  . CARDIOVASCULAR STRESS TEST  03/28/2003   EF 55%.  Normal cardiolite study  . JOINT REPLACEMENT  1990's   left TKR  . KNEE ARTHROSCOPY  1980's   right knee  . TONSILLECTOMY  1938    Family History  Problem Relation Age of Onset  . Kidney disease Mother   . Heart disease Mother   . Stroke Mother   . Cancer Father        prostate  . Mental illness Brother   . Diabetes Neg Hx   . COPD Neg Hx   . Heart attack Neg Hx   . Hypertension Neg Hx     Allergies  Allergen Reactions  . Cephalexin Other (See Comments)  Unknown reaction  . Furacin [Nitrofurazone] Other (See Comments)    Unknown reaction  . Macrodantin Other (See Comments)    Unknown reaction  . Zestril [Lisinopril] Other (See Comments)    Unknown reaction    Current Outpatient Prescriptions on File Prior to Visit  Medication Sig Dispense Refill  . aspirin 81 MG tablet Take 81 mg by mouth daily.    . ferrous sulfate 325 (65 FE) MG tablet Take 325 mg by mouth 2 (two) times daily.     . folic acid (FOLVITE) 1 MG tablet Take 1 mg by mouth 2 (two) times daily.     . furosemide (LASIX) 40 MG tablet TAKE 2 TABLETS BY MOUTH TWICE DAY X'S 5 DAYS THEN GO BACK TO 2 TABLETS BY MOUTH IN THE A.M. AND 1 TABLET BY MOUTH IN THE P.M DAILY 280 tablet 3  . hydrOXYzine (ATARAX/VISTARIL) 10 MG tablet TAKE (1) TABLET TWICE A DAY AS NEEDED. 120 tablet 1  . LORazepam (ATIVAN) 0.5 MG tablet TAKE 1 TABLET TWICE DAILY AS NEEDED FOR ANXIETY. 60 tablet 0  . Memantine HCl-Donepezil HCl (NAMZARIC) 28-10 MG  CP24 Take 1 tablet by mouth every morning. 30 capsule 11  . metoprolol tartrate (LOPRESSOR) 25 MG tablet Take 0.5 tablets (12.5 mg total) by mouth 2 (two) times daily. 90 tablet 3  . Multiple Vitamin (MULTIVITAMIN WITH MINERALS) TABS tablet Take 1 tablet by mouth daily.    . Multiple Vitamins-Minerals (PRESERVISION AREDS 2 PO) Take 1 tablet by mouth 2 (two) times daily.     [name] MYRBETRIQ 50 MG TB24 tablet TAKE 1 TABLET ONCE DAILY. 60 tablet 0  . nitroGLYCERIN (NITROSTAT) 0.4 MG SL tablet Place 1 tablet (0.4 mg total) under the tongue every 5 (five) minutes as needed for chest pain (x 3 doses daily). 30 tablet 3  . QUEtiapine (SEROQUEL) 25 MG tablet Take 1 tablet (25 mg total) by mouth at bedtime as needed. 30 tablet 6   No current facility-administered medications on file prior to visit.     BP (!) 90/40 (BP Location: Left Arm)   Pulse 85   Temp 98.8 F (37.1 C) (Temporal)   SpO2 94%        Objective:   Physical Exam  Constitutional: He appears well-developed and well-nourished. No distress.  Cardiovascular: Normal rate, regular rhythm, normal heart sounds and intact distal pulses.  Exam reveals no gallop and no friction rub.   No murmur heard. Pulmonary/Chest: Effort normal and breath sounds normal. No respiratory distress. He has no wheezes. He has no rales. He exhibits no tenderness.  Musculoskeletal:  Sitting in wheelchair   Skin: Skin is warm and dry. No rash noted. He is not diaphoretic. No erythema. No pallor.  Psychiatric: He has a normal mood and affect. His speech is delayed. He is slowed. Cognition and memory are impaired.  Nursing note and vitals reviewed.     Assessment & Plan:  1. Dementia with behavioral disturbance, unspecified dementia type - Appears to be worsening dementia. Will r/o infectious cause.  - Basic Metabolic Panel - CBC with Differential/Platelet - POC Urinalysis Dipstick- negative  - Consider chest x ray   [name], NP

## 2016-10-07 LAB — BASIC METABOLIC PANEL
BUN: 35 mg/dL — ABNORMAL HIGH (ref 6–23)
CHLORIDE: 104 meq/L (ref 96–112)
CO2: 32 mEq/L (ref 19–32)
Calcium: 9.1 mg/dL (ref 8.4–10.5)
Creatinine, Ser: 1.13 mg/dL (ref 0.40–1.50)
GFR: 65.17 mL/min (ref >60.00)
Glucose, Bld: 175 mg/dL — ABNORMAL HIGH (ref 70–99)
POTASSIUM: 3.9 meq/L (ref 3.5–5.1)
SODIUM: 144 meq/L (ref 135–145)

## 2016-10-07 LAB — CBC WITH DIFFERENTIAL/PLATELET
BASOS PCT: 0.4 % (ref 0.0–3.0)
Basophils Absolute: 0 10*3/uL (ref 0.0–0.1)
EOS ABS: 0.1 10*3/uL (ref 0.0–0.7)
EOS PCT: 1.3 % (ref 0.0–5.0)
HEMATOCRIT: 30.3 % — AB (ref 39.0–52.0)
HEMOGLOBIN: 10.1 g/dL — AB (ref 13.0–17.0)
LYMPHS PCT: 9.3 % — AB (ref 12.0–46.0)
Lymphs Abs: 0.5 10*3/uL — ABNORMAL LOW (ref 0.7–4.0)
MCHC: 33.3 g/dL (ref 30.0–36.0)
MCV: 89.2 fl (ref 78.0–100.0)
MONOS PCT: 11.7 % (ref 3.0–12.0)
Monocytes Absolute: 0.6 10*3/uL (ref 0.1–1.0)
NEUTROS ABS: 4.1 10*3/uL (ref 1.4–7.7)
Neutrophils Relative %: 77.3 % — ABNORMAL HIGH (ref 43.0–77.0)
PLATELETS: 141 10*3/uL — AB (ref 150.0–400.0)
RBC: 3.4 Mil/uL — ABNORMAL LOW (ref 4.22–5.81)
RDW: 16.6 % — AB (ref 11.5–15.5)
WBC: 5.3 10*3/uL (ref 4.0–10.5)

## 2016-10-08 ENCOUNTER — Ambulatory Visit: Payer: Medicare Other | Admitting: Internal Medicine

## 2016-10-11 ENCOUNTER — Encounter: Payer: Self-pay | Admitting: Adult Health

## 2016-10-15 ENCOUNTER — Ambulatory Visit (INDEPENDENT_AMBULATORY_CARE_PROVIDER_SITE_OTHER): Payer: Medicare Other | Admitting: Internal Medicine

## 2016-10-15 ENCOUNTER — Encounter: Payer: Self-pay | Admitting: Internal Medicine

## 2016-10-15 VITALS — BP 98/50 | HR 65 | Ht 73.0 in | Wt 184.0 lb

## 2016-10-15 DIAGNOSIS — I482 Chronic atrial fibrillation: Secondary | ICD-10-CM

## 2016-10-15 DIAGNOSIS — R531 Weakness: Secondary | ICD-10-CM

## 2016-10-15 DIAGNOSIS — I5030 Unspecified diastolic (congestive) heart failure: Secondary | ICD-10-CM

## 2016-10-15 DIAGNOSIS — D500 Iron deficiency anemia secondary to blood loss (chronic): Secondary | ICD-10-CM

## 2016-10-15 DIAGNOSIS — I4821 Permanent atrial fibrillation: Secondary | ICD-10-CM

## 2016-10-15 DIAGNOSIS — I4510 Unspecified right bundle-branch block: Secondary | ICD-10-CM

## 2016-10-15 NOTE — Progress Notes (Signed)
Follow-up Outpatient Visit Date: 10/15/2016  Primary Care Provider: [name], NP [address] [place] [address]  Chief Complaint: Weakness  HPI:  [name] is a 83 y.o. year-old male with history of heart failure with preserved ejection fraction, permanent atrial fibrillation not on anticoagulation secondary to recurrent GI bleeds, remote pleural and pericardial effusion status post pericardiocentesis, bladder cancer, diabetes mellitus, GERD, and dementia, who presents for follow-up of heart failure. He was previously followed by [name], and had seen by [name] several times since Dr. Sherryl Barters retirement (most recently on 09/07/16). Over the last few months, [name] had exhibited more fatigue and evidence of fluid retention, leading to escalation of his diuretic regimen. At his last visit last month, his caregivers reported increased energy.  Today, [name]' caregivers are concerned about increased weakness and dizziness. Unfortunately, [name] is unable to provide any history due to his dementia. About 2-3 weeks ago, [name] appeared to have less energy and also complained of dizziness (particularly when standing). He was evaluated by his PCP, [name], without clear cause for his symptoms. In the past, significant anemia has presented like this. However, his hemoglobin was only mildly decreased around 10, which is just below his baseline. He has remained on furosemide 80 mg QAM and 40 mg QPM with vigorous diuresis and significant weight loss. He continues to have a good appetite. His family denies any fevers, chills, dysuria, chest pain, and shortness of breath. He has chronic calf edema, which is stable to improved compared with prior visits. He lost his balance once earlier this week but did not fall, as his caregiver was able to catch him. He was recently started on as needed [name] by his neurologist, though he has only received this once in the  last week.  [name]' caregiver reports that some of her colleagues noticed blood in his stool last week. However, neither she nor her family have noticed any melena or hematochezia.[name]  --------------------------------------------------------------------------------------------------  Cardiovascular History & Procedures: Cardiovascular Problems:  HFpEF  Permanent atrial fibrillation  Risk Factors:  Hypertension, diabetes mellitus, male gender, and age > 24  Cath/PCI:  LHC (08/06/01): LMCA normal. LAD with mild luminal irregularities. Dominant LCx with mild luminal irregularities involving OM branches. Small RCA with mild luminal irregularities. LVEF 60-65%. LVEDP 11 mmHg.  CV Surgery:  Pericardiocentesis (01/10/15)  EP Procedures and Devices:  None  Non-Invasive Evaluation(s):  TTE (08/20/16): Normal LV size with LVEF 45-50%. Mild AI. MAC with mild RM. Severe LA enlargement. Moderately dilated RV with mildly reduced RV contraction. Severe RA enlargement. Mild PH (RVSP 39 mmHg). Elevated CVP.  Pharmacologic MPI (01/23/17): No significant ischemia or scar. LV appears dilated. LVEF not calculated.  Recent CV Pertinent Labs: Lab Results  Component Value Date   CHOL 125 03/20/2015   HDL 48 03/20/2015   LDLCALC 64 03/20/2015   TRIG 66 03/20/2015   CHOLHDL 2.6 03/20/2015   INR 1.46 01/12/2015   BNP 91.6 01/10/2015   K 3.9 10/06/2016   MG 2.1 01/21/2016   BUN 35 (H) 10/06/2016   BUN 21 09/07/2016   CREATININE 1.13 10/06/2016   CREATININE 0.74 03/20/2015    Past medical and surgical history were reviewed and updated in EPIC.  Current Meds  Medication Sig  . aspirin 81 MG tablet Take 81 mg by mouth daily.  . ferrous sulfate 325 (65 FE) MG tablet Take 325 mg by mouth 2 (two) times daily.   . folic acid (  FOLVITE) 1 MG tablet Take 1 mg by mouth 2 (two) times daily.   . furosemide (LASIX) 40 MG tablet TAKE 2 TABLETS BY MOUTH TWICE DAY X'S 5 DAYS THEN GO BACK TO 2 TABLETS BY  MOUTH IN THE A.M. AND 1 TABLET BY MOUTH IN THE P.M DAILY  . hydrOXYzine (ATARAX/VISTARIL) 10 MG tablet TAKE (1) TABLET TWICE A DAY AS NEEDED.  [name] LORazepam (ATIVAN) 0.5 MG tablet TAKE 1 TABLET TWICE DAILY AS NEEDED FOR ANXIETY.  . Memantine HCl-Donepezil HCl (NAMZARIC) 28-10 MG CP24 Take 1 tablet by mouth every morning.  . metoprolol tartrate (LOPRESSOR) 25 MG tablet Take 0.5 tablets (12.5 mg total) by mouth 2 (two) times daily.  . Multiple Vitamin (MULTIVITAMIN WITH MINERALS) TABS tablet Take 1 tablet by mouth daily.  . Multiple Vitamins-Minerals (PRESERVISION AREDS 2 PO) Take 1 tablet by mouth 2 (two) times daily.   [name] MYRBETRIQ 50 MG TB24 tablet TAKE 1 TABLET ONCE DAILY.  . nitroGLYCERIN (NITROSTAT) 0.4 MG SL tablet Place 1 tablet (0.4 mg total) under the tongue every 5 (five) minutes as needed for chest pain (x 3 doses daily).  . QUEtiapine ([name]) 25 MG tablet Take 1 tablet (25 mg total) by mouth at bedtime as needed.    Allergies: Cephalexin; Furacin [nitrofurazone]; Macrodantin; and Zestril [lisinopril]  Social History   Social History  . Marital status: Married    Spouse name: [name]  . Number of children: 4  . Years of education: 10th    Occupational History  . retired Retired   Social History Main Topics  . Smoking status: Former Smoker    Quit date: 08/28/1975  . Smokeless tobacco: Former Systems developer  . Alcohol use No  . Drug use: No  . Sexual activity: Not Currently   Other Topics Concern  . Not on file   Social History Narrative   Patient lives at home with his wife [name]) Married 74 years.   Retired   Education 10 th grade   Right handed   Caffeine one or two daily     Family History  Problem Relation Age of Onset  . Kidney disease Mother   . Heart disease Mother   . Stroke Mother   . Cancer Father        prostate  . Mental illness Brother   . Diabetes Neg Hx   . COPD Neg Hx   . Heart attack Neg Hx   . Hypertension Neg Hx     Review of Systems: A  12-system review of systems was performed and was negative except as noted in the HPI.  --------------------------------------------------------------------------------------------------  Physical Exam: Pulse 93   Ht _0  (1.854 m)   Wt 184 lb (83.5 kg)   SpO2 93%   BMI 24.28 kg/m   Initial BP 130/62; repeat BP 98/50  General:  Frail, elderly man, seated in wheelchair. He is accompanied by his wife, son, and caregiver. HEENT: Mild conjunctival pallor without scleral icterus. Dry mucous membranes. Neck: Supple without lymphadenopathy, thyromegaly, JVD, or HJR. Lungs: Normal work of breathing. Coarse breath sounds without wheezes or crackles. Heart: Irregularly irregular without murmurs, rubs, or gallops. Non-displaced PMI. Abd: Bowel sounds present. Soft, NT/ND without hepatosplenomegaly Ext: 2+ pitting edema to proximal calves. 2j+ radial and 1+ pedal pulses bilaterally Skin: Warm and dry without rash.  EKG:  Atrial fibrillation with PVC versus aberrancy. RBBB. Compared with prior tracing from 08/25/16, RBBB has replaced non-specific IVCD.  Lab Results  Component Value Date  WBC 5.3 10/06/2016   HGB 10.1 (L) 10/06/2016   HCT 30.3 (L) 10/06/2016   MCV 89.2 10/06/2016   PLT 141.0 (L) 10/06/2016    Lab Results  Component Value Date   NA 144 10/06/2016   K 3.9 10/06/2016   CL 104 10/06/2016   CO2 32 10/06/2016   BUN 35 (H) 10/06/2016   CREATININE 1.13 10/06/2016   GLUCOSE 175 (H) 10/06/2016   ALT 12 (L) 08/18/2015    Lab Results  Component Value Date   CHOL 125 03/20/2015   HDL 48 03/20/2015   LDLCALC 64 03/20/2015   TRIG 66 03/20/2015   CHOLHDL 2.6 03/20/2015    --------------------------------------------------------------------------------------------------  ASSESSMENT AND PLAN: Weakness with HFpEF [name] appears chronically ill and is unable to provide any history. His family feels that he has been significantly weaker over the last 2-3 weeks with  dizziness. He has bilateral calf edema without significant JVD and has lost 3 pounds since 6/19 and 8 pounds since 6/6. Though his initial BP was normal, repeat measurement is borderline low and closer to what was recorded at [name]' recent visit with [name]. We have agreed to deescalate diuresis to furosemide 80 mg daily. I advised [name]' family and caregiver to begin weighing [name] every morning and to have him take an extra dose of furosemide 40 mg if he gains 2 pounds in 24 hours or 5 pounds in 1 week. Labs last week were notable for modest bump in BUN and creatinine, as well as mild anemia. We will not make any other medication changes. If his symptoms worsen, [name]' family should contact [name] or proceed to the ED for further assessment.  Anemia Chronic problem. Hemoglobin was 10.1 last week, which is down from 12.5 in March but similar to his baseline as far back as 2016. Given history of GI bleeding and questionable report of recent blood stools, I advised Mr. Peplinski' family and caregiver to take him to [name] or the ED if any further rectal bleeding is identified.  RBBB Right bundle branch block is noted for the first time today, which has replaced IVCD. I do not think that progressive conduction disease alone would account for Mr. Arguijo' symptoms. However, we will need to monitor this with periodic EKGs.  Permanent atrial fibrillation Heart rate is adequately controlled. We will continue with Mr. Patti' current dose of metoprolol. He should remain on aspirin, though this too will need to be stopped if he has recurrent bleeding. He is not on anticoagulation due to history of recurrent GI bleeds.  Follow-up: Return to clinic in 2 weeks.  Nelva Bush, MD 10/15/2016 3:49 PM

## 2016-10-15 NOTE — Patient Instructions (Signed)
Medication Instructions:  Decrease lasix (furosemide) to 80 mg daily in the morning. You can take 2 of your 40 mg tablets daily in the morning.  Dr End wants you to weigh daily. If you gain more than 2 pounds in 24 hours or 5 pounds in 1 week, take an extra lasix 40 mg.  Labwork: none  Testing/Procedures: none  Follow-Up: Your physician recommends that you schedule a follow-up appointment in: 2 weeks with Dr Delbert Phenix, PA or other APP.        If you need a refill on your cardiac medications before your next appointment, please call your pharmacy.

## 2016-10-16 ENCOUNTER — Encounter: Payer: Self-pay | Admitting: Internal Medicine

## 2016-10-16 DIAGNOSIS — I5030 Unspecified diastolic (congestive) heart failure: Secondary | ICD-10-CM | POA: Insufficient documentation

## 2016-10-16 DIAGNOSIS — I4510 Unspecified right bundle-branch block: Secondary | ICD-10-CM | POA: Insufficient documentation

## 2016-10-18 NOTE — Addendum Note (Signed)
Addended by: Mendel Ryder on: 10/18/2016 02:41 PM   Modules accepted: Orders

## 2016-10-27 ENCOUNTER — Encounter: Payer: Self-pay | Admitting: Nurse Practitioner

## 2016-10-27 ENCOUNTER — Ambulatory Visit (INDEPENDENT_AMBULATORY_CARE_PROVIDER_SITE_OTHER): Payer: Medicare Other | Admitting: Nurse Practitioner

## 2016-10-27 VITALS — BP 108/60 | HR 80 | Ht 73.0 in | Wt 215.4 lb

## 2016-10-27 DIAGNOSIS — I482 Chronic atrial fibrillation: Secondary | ICD-10-CM | POA: Diagnosis not present

## 2016-10-27 DIAGNOSIS — R531 Weakness: Secondary | ICD-10-CM

## 2016-10-27 DIAGNOSIS — I4821 Permanent atrial fibrillation: Secondary | ICD-10-CM

## 2016-10-27 DIAGNOSIS — I5030 Unspecified diastolic (congestive) heart failure: Secondary | ICD-10-CM | POA: Diagnosis not present

## 2016-10-27 NOTE — Progress Notes (Signed)
CARDIOLOGY OFFICE NOTE  Date:  10/27/2016    [name] Date of Birth: Jan 03, 1928 Medical Record #[id number]  PCP:  [name], NP  Cardiologist:  End  Chief Complaint  Patient presents with  . Congestive Heart Failure    Follow up visit - seen for [name]    History of Present Illness: AHAN EISENBERGER is a 83 y.o. male who presents today for a follow up visit. Seen for [name].   He has a history of dementia, systolic heart failure, permanent atrial fibrillation not on anticoagulation secondary to recurrent GI bleeds, remote pleural and pericardial effusion status post pericardiocentesis, bladder cancer, diabetes mellitus, & GERD.   He was previously followed by [name], and had seen by [name] several times since Dr. Sherryl Barters retirement.  Over the last few months, Mr. Skelton had exhibited more fatigue and evidence of fluid retention, leading to escalation of his diuretic regimen.   Seen 2 weeks ago by [name] - caregivers were concerned about increased weakness and dizziness. He was not able to provide any history due to his poor mental faculties. [name] cut the diuretics back.   Comes in today. Here with his son. Mr. Croghan is demented - but able to say yes and no. Son notes more swelling. Hard to say if the weight is accurate here - on a different scale. They have not been able to weigh at home but son has worked with the caregivers and hopefully that will change. Not really any more short of breath than his usual shortness of breath. Still seems weak. Spends most of the day in his chair or in the bathroom. Walks with the walker. Does not sound like he gets much salt. Tells me "no" when asked about shortness of breath or chest pain.  Wheelchair bound. Son would like to add back extra diuretic.   Past Medical History:  Diagnosis Date  . Anemia    iron deficiency secondary to bleeding  . Arrhythmia    a fib  . Arthritis    DJD knee - left  . Bladder  cancer (HCC)    BC[facility] [name]  . Diabetes mellitus    NIDDM on oral medication  . ED (erectile dysfunction)   . Genital warts    recurrent. Not a problem  . GERD (gastroesophageal reflux disease)    treated with PPI  . Hard of hearing   . Hematochezia    has intermittent problem  . Hypertension    mild  . Irritable bladder   . Memory disorder   . Neuropathy    burning discomfort feet/legs  . Restless leg    treats with heat    Past Surgical History:  Procedure Laterality Date  . APPENDECTOMY  1946  . CARDIAC CATHETERIZATION N/A 01/13/2015   Procedure: Pericardiocentesis;  Surgeon: Leonie Man, MD;  Location: [facility];  Service: Cardiovascular;  Laterality: N/A;  . CARDIOVASCULAR STRESS TEST  03/28/2003   EF 55%.  Normal cardiolite study  . JOINT REPLACEMENT  1990's   left TKR  . KNEE ARTHROSCOPY  1980's   right knee  . TONSILLECTOMY  1938     Medications: Current Meds  Medication Sig  . aspirin 81 MG tablet Take 81 mg by mouth daily.  . ferrous sulfate 325 (65 FE) MG tablet Take 325 mg by mouth 2 (two) times daily.   . folic acid (FOLVITE) 1 MG tablet Take 1  mg by mouth 2 (two) times daily.   . furosemide (LASIX) 40 MG tablet Take 40 mg by mouth daily. Take 2 tablets (80 mg) by mouth in the morning ever day, take 1 tablet (40MG ) in the afternoon for 1 week and then just on M-W-F  . hydrOXYzine (ATARAX/VISTARIL) 10 MG tablet TAKE (1) TABLET TWICE A DAY AS NEEDED.  [name] LORazepam (ATIVAN) 0.5 MG tablet TAKE 1 TABLET TWICE DAILY AS NEEDED FOR ANXIETY.  . Memantine HCl-Donepezil HCl (NAMZARIC) 28-10 MG CP24 Take 1 tablet by mouth every morning.  . metoprolol tartrate (LOPRESSOR) 25 MG tablet Take 0.5 tablets (12.5 mg total) by mouth 2 (two) times daily.  . Multiple Vitamin (MULTIVITAMIN WITH MINERALS) TABS tablet Take 1 tablet by mouth daily.  . Multiple Vitamins-Minerals (PRESERVISION AREDS 2 PO) Take 1 tablet by mouth 2 (two) times daily.   [name] MYRBETRIQ 50  MG TB24 tablet TAKE 1 TABLET ONCE DAILY.  . nitroGLYCERIN (NITROSTAT) 0.4 MG SL tablet Place 1 tablet (0.4 mg total) under the tongue every 5 (five) minutes as needed for chest pain (x 3 doses daily).  . QUEtiapine (SEROQUEL) 25 MG tablet Take 1 tablet (25 mg total) by mouth at bedtime as needed.     Allergies: Allergies  Allergen Reactions  . Cephalexin Other (See Comments)    Unknown reaction  . Furacin [Nitrofurazone] Other (See Comments)    Unknown reaction  . Macrodantin Other (See Comments)    Unknown reaction  . Zestril [Lisinopril] Other (See Comments)    Unknown reaction    Social History: The patient  reports that he quit smoking about 41 years ago. He has quit using smokeless tobacco. He reports that he does not drink alcohol or use drugs.   Family History: The patient's family history includes Cancer in his father; Heart disease in his mother; Kidney disease in his mother; Mental illness in his brother; Stroke in his mother.   Review of Systems: Please see the history of present illness.   Otherwise, the review of systems is positive for none.   All other systems are reviewed and negative.   Physical Exam: VS:  BP 108/60 (BP Location: Left Arm, Patient Position: Sitting, Cuff Size: Normal)   Pulse 80   Ht 6\' 1"  (1.854 m)   Wt 215 lb 6.4 oz (97.7 kg)   SpO2 94%   BMI 28.42 kg/m  .  BMI Body mass index is 28.42 kg/m.  Wt Readings from Last 3 Encounters:  10/27/16 215 lb 6.4 oz (97.7 kg)  10/15/16 184 lb (83.5 kg)  09/07/16 187 lb 12.8 oz (85.2 kg)    General: Elderly male who is demented but in no acute distress. Cute smile. Slumped in wheelchair but responsive.  Looks chronically ill. Color is sallow.  HEENT: Normal.  Neck: Supple, no JVD, carotid bruits, or masses noted.  Cardiac: Irregular irregular rhythm. Rate is ok. Over 2+ edema up to the thighs.  Respiratory:  Lungs are clear to auscultation bilaterally with normal work of breathing.  GI: Soft and  nontender.  MS: No deformity or atrophy. Gait not tested - he is in a wheelchair.  Skin: Warm and dry. Color is sallow.  Neuro:  Strength and sensation are intact and no gross focal deficits noted.  Psych: Alert, appropriate and with normal affect.   LABORATORY DATA:  EKG:  EKG is not ordered today.  Lab Results  Component Value Date   WBC 5.3 10/06/2016   HGB 10.1 (L) 10/06/2016  HCT 30.3 (L) 10/06/2016   PLT 141.0 (L) 10/06/2016   GLUCOSE 175 (H) 10/06/2016   CHOL 125 03/20/2015   TRIG 66 03/20/2015   HDL 48 03/20/2015   LDLCALC 64 03/20/2015   ALT 12 (L) 08/18/2015   AST 18 08/18/2015   NA 144 10/06/2016   K 3.9 10/06/2016   CL 104 10/06/2016   CREATININE 1.13 10/06/2016   BUN 35 (H) 10/06/2016   CO2 32 10/06/2016   TSH 2.71 02/18/2016   INR 1.46 01/12/2015   HGBA1C 4.5 (L) 07/16/2014     BNP (last 3 results) No results for input(s): BNP in the last 8760 hours.  ProBNP (last 3 results)  Recent Labs  04/26/16 1519 08/25/16 1510  PROBNP 1,905* 1,266*     Other Studies Reviewed Today:  Echo Study Conclusions June 2018  - Left ventricle: The cavity size was normal. Systolic function was   mildly reduced. The estimated ejection fraction was in the range   of 45% to 50%. Wall motion was normal; there were no regional   wall motion abnormalities. The study was not technically   sufficient to allow evaluation of LV diastolic dysfunction due to   atrial fibrillation. - Ventricular septum: Septal motion showed paradox. - Aortic valve: There was mild regurgitation. - Mitral valve: Calcified annulus. Mildly thickened leaflets .   There was mild regurgitation. - Left atrium: The atrium was severely dilated. - Right ventricle: The cavity size was moderately dilated. Wall   thickness was normal. Systolic function was mildly reduced. - Right atrium: The atrium was severely dilated. - Tricuspid valve: There was moderate regurgitation. - Pulmonary arteries:  Systolic pressure was mildly increased. PA   peak pressure: 39 mm Hg (S). - Inferior vena cava: The vessel was dilated. The respirophasic   diameter changes were blunted (< 50%), consistent with elevated   central venous pressure.  Assessment/Plan:  1. Chronic weakness with HFpEF - diuretics have been cut back - now weight is back up - does not really seem that symptomatic - but with more swelling. I am not convinced that the weights are accurate given his physical ability to stand. Family will try to get weights at home. We will increase his Lasix to 80 mg in the AM and 40 mg in the PM for one week and then 80 mg in the AM daily and 40 mg in the PM only on M-W-F. Overall situation looks poor to me. Rechecking lab today as well.   2. Anemia - Chronic problem. History of GI bleeding. Rechecking lab today.   3. RBBB - just noted at last visit.   4. Permanent atrial fibrillation - managed with rate control. No anticoagulation other than aspirin due to history of recurrent bleeding.   5. Dementia   6. Advanced age.   Current medicines are reviewed with the patient today.  The patient does not have concerns regarding medicines other than what has been noted above.  The following changes have been made:  See above.  Labs/ tests ordered today include:    Orders Placed This Encounter  Procedures  . Basic metabolic panel  . CBC     Disposition:   FU with [name] & his team in about a month.   Patient is agreeable to this plan and will call if any problems develop in the interim.   SignedTruitt Merle, NP  10/27/2016 2:30 PM  [facility] [address] [place], [place]  [address] Phone: [phone] Fax: [phone]

## 2016-10-27 NOTE — Patient Instructions (Addendum)
We will be checking the following labs today - BMET & CBC   Medication Instructions:    Continue with your current medicines. BUT  Increase the Lasix to 2 tablets each morning every day and 1 tablet in the afternoon for 1 week and then just 1 tablet in the afternoon's on M-W-F    Testing/Procedures To Be Arranged:  N/A  Follow-Up:   See Dr. Armando Gang in 78month.     Other Special Instructions:   Try to weigh daily as best you can.     If you need a refill on your cardiac medications before your next appointment, please call your pharmacy.   Call the [employer] office at [phone] if you have any questions, problems or concerns.

## 2016-10-27 NOTE — Progress Notes (Deleted)
CARDIOLOGY OFFICE NOTE  Date:  10/27/2016    [name] Date of Birth: June 30, 1927 Medical Record #[id number]  PCP:  [name], NP  Cardiologist:  End  Chief Complaint  Patient presents with  . Congestive Heart Failure    Follow up visit - seen for [name]    History of Present Illness: [name] is a 83 y.o. male who presents today for a follow up visit. Seen for [name].   He has a history of dementia, systolic heart failure, permanent atrial fibrillation not on anticoagulation secondary to recurrent GI bleeds, remote pleural and pericardial effusion status post pericardiocentesis, bladder cancer, diabetes mellitus, & GERD.   He was previously followed by [name], and had seen by [name] several times since Dr. Sherryl Barters retirement.  Over the last few months, [name] had exhibited more fatigue and evidence of fluid retention, leading to escalation of his diuretic regimen.   Seen 2 weeks ago by [name] - caregivers were concerned about increased weakness and dizziness. He was not able to provide any history due to his poor mental faculties. [name] cut the diuretics back.   Comes in today. Here with   Past Medical History:  Diagnosis Date  . Anemia    iron deficiency secondary to bleeding  . Arrhythmia    a fib  . Arthritis    DJD knee - left  . Bladder cancer (HCC)    BCG [name]  . Diabetes mellitus    NIDDM on oral medication  . ED (erectile dysfunction)   . Genital warts    recurrent. Not a problem  . GERD (gastroesophageal reflux disease)    treated with PPI  . Hard of hearing   . Hematochezia    has intermittent problem  . Hypertension    mild  . Irritable bladder   . Memory disorder   . Neuropathy    burning discomfort feet/legs  . Restless leg    treats with heat    Past Surgical History:  Procedure Laterality Date  . APPENDECTOMY  1946  . CARDIAC CATHETERIZATION N/A 01/13/2015   Procedure:  Pericardiocentesis;  Surgeon: Leonie Man, MD;  Location: [facility];  Service: Cardiovascular;  Laterality: N/A;  . CARDIOVASCULAR STRESS TEST  03/28/2003   EF 55%.  Normal cardiolite study  . JOINT REPLACEMENT  1990's   left TKR  . KNEE ARTHROSCOPY  1980's   right knee  . TONSILLECTOMY  1938     Medications: No outpatient prescriptions have been marked as taking for the 10/27/16 encounter (Office Visit) with [name], NP.     Allergies: Allergies  Allergen Reactions  . Cephalexin Other (See Comments)    Unknown reaction  . Furacin [Nitrofurazone] Other (See Comments)    Unknown reaction  . Macrodantin Other (See Comments)    Unknown reaction  . Zestril [Lisinopril] Other (See Comments)    Unknown reaction    Social History: The patient  reports that he quit smoking about 41 years ago. He has quit using smokeless tobacco. He reports that he does not drink alcohol or use drugs.   Family History: The patient's family history includes Cancer in his father; Heart disease in his mother; Kidney disease in his mother; Mental illness in his brother; Stroke in his mother.   Review of Systems: Please see the history of present illness.   Otherwise, the review of systems is positive for  none.   All other systems are reviewed and negative.   Physical Exam: VS:  There were no vitals taken for this visit. [name]  BMI There is no height or weight on file to calculate BMI.  Wt Readings from Last 3 Encounters:  10/15/16 184 lb (83.5 kg)  09/07/16 187 lb 12.8 oz (85.2 kg)  08/25/16 192 lb 6.4 oz (87.3 kg)    General: Elderly male who is demented but in no acute distress.   HEENT: Normal.  Neck: Supple, no JVD, carotid bruits, or masses noted.  Cardiac: Irregular irregular rhythm. Rate is ok. No edema.  Respiratory:  Lungs are clear to auscultation bilaterally with normal work of breathing.  GI: Soft and nontender.  MS: No deformity or atrophy. Gait not tested - he is in  a wheelchair.  Skin: Warm and dry. Color is normal.  Neuro:  Strength and sensation are intact and no gross focal deficits noted.  Psych: Alert, appropriate and with normal affect.   LABORATORY DATA:  EKG:  EKG is not ordered today.  Lab Results  Component Value Date   WBC 5.3 10/06/2016   HGB 10.1 (L) 10/06/2016   HCT 30.3 (L) 10/06/2016   PLT 141.0 (L) 10/06/2016   GLUCOSE 175 (H) 10/06/2016   CHOL 125 03/20/2015   TRIG 66 03/20/2015   HDL 48 03/20/2015   LDLCALC 64 03/20/2015   ALT 12 (L) 08/18/2015   AST 18 08/18/2015   NA 144 10/06/2016   K 3.9 10/06/2016   CL 104 10/06/2016   CREATININE 1.13 10/06/2016   BUN 35 (H) 10/06/2016   CO2 32 10/06/2016   TSH 2.71 02/18/2016   INR 1.46 01/12/2015   HGBA1C 4.5 (L) 07/16/2014     BNP (last 3 results) No results for input(s): BNP in the last 8760 hours.  ProBNP (last 3 results)  Recent Labs  04/26/16 1519 08/25/16 1510  PROBNP 1,905* 1,266*     Other Studies Reviewed Today:  Echo Study Conclusions June 2018  - Left ventricle: The cavity size was normal. Systolic function was   mildly reduced. The estimated ejection fraction was in the range   of 45% to 50%. Wall motion was normal; there were no regional   wall motion abnormalities. The study was not technically   sufficient to allow evaluation of LV diastolic dysfunction due to   atrial fibrillation. - Ventricular septum: Septal motion showed paradox. - Aortic valve: There was mild regurgitation. - Mitral valve: Calcified annulus. Mildly thickened leaflets .   There was mild regurgitation. - Left atrium: The atrium was severely dilated. - Right ventricle: The cavity size was moderately dilated. Wall   thickness was normal. Systolic function was mildly reduced. - Right atrium: The atrium was severely dilated. - Tricuspid valve: There was moderate regurgitation. - Pulmonary arteries: Systolic pressure was mildly increased. PA   peak pressure: 39 mm Hg  (S). - Inferior vena cava: The vessel was dilated. The respirophasic   diameter changes were blunted (< 50%), consistent with elevated   central venous pressure.  Assessment/Plan:  1. Chronic weakness with HFpEF - diuretics have been cut back and using extra for weight gain.   2. Anemia - Chronic problem. History of GI bleeding  3. RBBB - just noted at last visit.   4. Permanent atrial fibrillation - managed with rate control. No anticoagulation other than aspirin due to history of recurrent bleeding.   5. Dementia -   Current medicines are reviewed with the  patient today.  The patient does not have concerns regarding medicines other than what has been noted above.  The following changes have been made:  See above.  Labs/ tests ordered today include:   No orders of the defined types were placed in this encounter.    Disposition:   FU with [name] & his team as planned.    Patient is agreeable to this plan and will call if any problems develop in the interim.   SignedTruitt Merle, NP  10/27/2016 2:04 PM  [facility] [address] [place], [place]  [address] Phone: [phone] Fax: [phone]

## 2016-10-28 ENCOUNTER — Encounter (HOSPITAL_COMMUNITY): Payer: Self-pay | Admitting: Emergency Medicine

## 2016-10-28 ENCOUNTER — Emergency Department (HOSPITAL_COMMUNITY): Payer: Medicare Other

## 2016-10-28 ENCOUNTER — Other Ambulatory Visit: Payer: Self-pay | Admitting: Family Medicine

## 2016-10-28 ENCOUNTER — Telehealth: Payer: Self-pay | Admitting: *Deleted

## 2016-10-28 ENCOUNTER — Inpatient Hospital Stay (HOSPITAL_COMMUNITY)
Admission: EM | Admit: 2016-10-28 | Discharge: 2016-10-30 | DRG: 378 | Disposition: A | Discharge status: Home / Self Care | Payer: Medicare Other | Attending: Internal Medicine | Admitting: Internal Medicine

## 2016-10-28 DIAGNOSIS — Z841 Family history of disorders of kidney and ureter: Secondary | ICD-10-CM | POA: Diagnosis not present

## 2016-10-28 DIAGNOSIS — D509 Iron deficiency anemia, unspecified: Secondary | ICD-10-CM | POA: Diagnosis present

## 2016-10-28 DIAGNOSIS — I071 Rheumatic tricuspid insufficiency: Secondary | ICD-10-CM | POA: Diagnosis present

## 2016-10-28 DIAGNOSIS — Z66 Do not resuscitate: Secondary | ICD-10-CM | POA: Diagnosis present

## 2016-10-28 DIAGNOSIS — F0281 Dementia in other diseases classified elsewhere with behavioral disturbance: Secondary | ICD-10-CM | POA: Diagnosis present

## 2016-10-28 DIAGNOSIS — D500 Iron deficiency anemia secondary to blood loss (chronic): Secondary | ICD-10-CM | POA: Diagnosis present

## 2016-10-28 DIAGNOSIS — Z7982 Long term (current) use of aspirin: Secondary | ICD-10-CM

## 2016-10-28 DIAGNOSIS — I10 Essential (primary) hypertension: Secondary | ICD-10-CM | POA: Diagnosis not present

## 2016-10-28 DIAGNOSIS — Z8249 Family history of ischemic heart disease and other diseases of the circulatory system: Secondary | ICD-10-CM

## 2016-10-28 DIAGNOSIS — Z79899 Other long term (current) drug therapy: Secondary | ICD-10-CM | POA: Diagnosis not present

## 2016-10-28 DIAGNOSIS — D649 Anemia, unspecified: Secondary | ICD-10-CM | POA: Diagnosis not present

## 2016-10-28 DIAGNOSIS — E119 Type 2 diabetes mellitus without complications: Secondary | ICD-10-CM

## 2016-10-28 DIAGNOSIS — G309 Alzheimer's disease, unspecified: Principal | ICD-10-CM

## 2016-10-28 DIAGNOSIS — Z515 Encounter for palliative care: Secondary | ICD-10-CM | POA: Diagnosis present

## 2016-10-28 DIAGNOSIS — Z8551 Personal history of malignant neoplasm of bladder: Secondary | ICD-10-CM | POA: Diagnosis not present

## 2016-10-28 DIAGNOSIS — I482 Chronic atrial fibrillation: Secondary | ICD-10-CM | POA: Diagnosis not present

## 2016-10-28 DIAGNOSIS — Z87891 Personal history of nicotine dependence: Secondary | ICD-10-CM

## 2016-10-28 DIAGNOSIS — I110 Hypertensive heart disease with heart failure: Secondary | ICD-10-CM | POA: Diagnosis present

## 2016-10-28 DIAGNOSIS — F03918 Unspecified dementia, unspecified severity, with other behavioral disturbance: Secondary | ICD-10-CM | POA: Diagnosis present

## 2016-10-28 DIAGNOSIS — Z96652 Presence of left artificial knee joint: Secondary | ICD-10-CM | POA: Diagnosis present

## 2016-10-28 DIAGNOSIS — K921 Melena: Secondary | ICD-10-CM | POA: Diagnosis not present

## 2016-10-28 DIAGNOSIS — K922 Gastrointestinal hemorrhage, unspecified: Secondary | ICD-10-CM

## 2016-10-28 DIAGNOSIS — F0391 Unspecified dementia with behavioral disturbance: Secondary | ICD-10-CM | POA: Diagnosis not present

## 2016-10-28 DIAGNOSIS — I5022 Chronic systolic (congestive) heart failure: Secondary | ICD-10-CM | POA: Diagnosis present

## 2016-10-28 DIAGNOSIS — F0151 Vascular dementia with behavioral disturbance: Secondary | ICD-10-CM | POA: Diagnosis not present

## 2016-10-28 DIAGNOSIS — Z823 Family history of stroke: Secondary | ICD-10-CM | POA: Diagnosis not present

## 2016-10-28 DIAGNOSIS — J90 Pleural effusion, not elsewhere classified: Secondary | ICD-10-CM | POA: Diagnosis not present

## 2016-10-28 DIAGNOSIS — I4891 Unspecified atrial fibrillation: Secondary | ICD-10-CM | POA: Diagnosis present

## 2016-10-28 DIAGNOSIS — R9431 Abnormal electrocardiogram [ECG] [EKG]: Secondary | ICD-10-CM | POA: Diagnosis not present

## 2016-10-28 HISTORY — DX: Pneumonia, unspecified organism: J18.9

## 2016-10-28 HISTORY — DX: Type 2 diabetes mellitus without complications: E11.9

## 2016-10-28 HISTORY — DX: Unspecified atrial fibrillation: I48.91

## 2016-10-28 HISTORY — DX: Personal history of other diseases of the digestive system: Z87.19

## 2016-10-28 HISTORY — DX: Dependence on supplemental oxygen: Z99.81

## 2016-10-28 HISTORY — DX: Heart failure, unspecified: I50.9

## 2016-10-28 HISTORY — DX: Unspecified dementia, unspecified severity, without behavioral disturbance, psychotic disturbance, mood disturbance, and anxiety: F03.90

## 2016-10-28 HISTORY — DX: Personal history of other medical treatment: Z92.89

## 2016-10-28 LAB — COMPREHENSIVE METABOLIC PANEL
ALBUMIN: 3.2 g/dL — AB (ref 3.5–5.0)
ALK PHOS: 93 U/L (ref 38–126)
ALT: 15 U/L — ABNORMAL LOW (ref 17–63)
ANION GAP: 7 (ref 5–15)
AST: 19 U/L (ref 15–41)
BUN: 21 mg/dL — ABNORMAL HIGH (ref 6–20)
CALCIUM: 8.6 mg/dL — AB (ref 8.9–10.3)
CO2: 27 mmol/L (ref 22–32)
Chloride: 106 mmol/L (ref 101–111)
Creatinine, Ser: 0.98 mg/dL (ref 0.61–1.24)
GFR calc non Af Amer: >60 mL/min (ref >60)
Glucose, Bld: 102 mg/dL — ABNORMAL HIGH (ref 65–99)
POTASSIUM: 3.5 mmol/L (ref 3.5–5.1)
SODIUM: 140 mmol/L (ref 135–145)
Total Bilirubin: 0.5 mg/dL (ref 0.3–1.2)
Total Protein: 5.8 g/dL — ABNORMAL LOW (ref 6.5–8.1)

## 2016-10-28 LAB — CBC
HEMATOCRIT: 24.1 % — AB (ref 39.0–52.0)
HEMOGLOBIN: 7.1 g/dL — AB (ref 13.0–17.0)
Hematocrit: 22.6 % — ABNORMAL LOW (ref 37.5–51.0)
Hemoglobin: 6.9 g/dL — CL (ref 13.0–17.7)
MCH: 27.6 pg (ref 26.6–33.0)
MCH: 26.7 pg (ref 26.0–34.0)
MCHC: 30.5 g/dL — ABNORMAL LOW (ref 31.5–35.7)
MCHC: 29.5 g/dL — ABNORMAL LOW (ref 30.0–36.0)
MCV: 90 fL (ref 79–97)
MCV: 90.6 fL (ref 78.0–100.0)
Platelets: 178 10*3/uL (ref 150–379)
Platelets: 180 10*3/uL (ref 150–400)
RBC: 2.5 x10E6/uL — CL (ref 4.14–5.80)
RBC: 2.66 MIL/uL — AB (ref 4.22–5.81)
RDW: 15.9 % — ABNORMAL HIGH (ref 12.3–15.4)
RDW: 16.3 % — ABNORMAL HIGH (ref 11.5–15.5)
WBC: 3.4 10*3/uL (ref 3.4–10.8)
WBC: 4.2 10*3/uL (ref 4.0–10.5)

## 2016-10-28 LAB — HEMOGLOBIN: Hemoglobin: 6.7 g/dL — CL (ref 13.0–17.0)

## 2016-10-28 LAB — BASIC METABOLIC PANEL
BUN/Creatinine Ratio: 26 — ABNORMAL HIGH (ref 10–24)
BUN: 22 mg/dL (ref 8–27)
CO2: 26 mmol/L (ref 20–29)
Calcium: 8.4 mg/dL — ABNORMAL LOW (ref 8.6–10.2)
Chloride: 106 mmol/L (ref 96–106)
Creatinine, Ser: 0.85 mg/dL (ref 0.76–1.27)
GFR calc Af Amer: 91 mL/min/{1.73_m2} (ref >59)
GFR calc non Af Amer: 78 mL/min/{1.73_m2} (ref >59)
Glucose: 109 mg/dL — ABNORMAL HIGH (ref 65–99)
Potassium: 4 mmol/L (ref 3.5–5.2)
Sodium: 144 mmol/L (ref 134–144)

## 2016-10-28 LAB — PREPARE RBC (CROSSMATCH)

## 2016-10-28 LAB — MAGNESIUM: Magnesium: 2.1 mg/dL (ref 1.7–2.4)

## 2016-10-28 LAB — GLUCOSE, CAPILLARY: GLUCOSE-CAPILLARY: 111 mg/dL — AB (ref 65–99)

## 2016-10-28 LAB — POC OCCULT BLOOD, ED: FECAL OCCULT BLD: POSITIVE — AB

## 2016-10-28 MED ORDER — MIRABEGRON ER 50 MG PO TB24: 50.0000 mg | ORAL_TABLET | Freq: Every day | ORAL | 5 refills | Status: AC

## 2016-10-28 MED ORDER — LORAZEPAM 0.5 MG PO TABS: 0.5000 mg | ORAL_TABLET | Freq: Two times a day (BID) | ORAL | Status: DC | PRN

## 2016-10-28 MED ORDER — NITROGLYCERIN 0.4 MG SL SUBL: 0.4000 mg | SUBLINGUAL_TABLET | SUBLINGUAL | Status: DC | PRN

## 2016-10-28 MED ORDER — METOPROLOL TARTRATE 5 MG/5ML IV SOLN
2.5000 mg | Freq: Four times a day (QID) | INTRAVENOUS | Status: DC
  Administered 2016-10-29: 2.5 mg via INTRAVENOUS
  Filled 2016-10-28: qty 5

## 2016-10-28 MED ORDER — LORAZEPAM 0.5 MG PO TABS: ORAL_TABLET | ORAL | 0 refills | Status: DC

## 2016-10-28 MED ORDER — QUETIAPINE FUMARATE 25 MG PO TABS
25.0000 mg | ORAL_TABLET | Freq: Every evening | ORAL | Status: DC | PRN
  Administered 2016-10-28 – 2016-10-29 (×2): 25 mg via ORAL
  Filled 2016-10-28 (×2): qty 1

## 2016-10-28 MED ORDER — SODIUM CHLORIDE 0.9 % IV SOLN
INTRAVENOUS | Status: DC
  Administered 2016-10-29: 04:00:00 via INTRAVENOUS

## 2016-10-28 MED ORDER — PROCHLORPERAZINE EDISYLATE 5 MG/ML IJ SOLN: 5.0000 mg | INTRAMUSCULAR | Status: DC | PRN

## 2016-10-28 MED ORDER — MIRABEGRON ER 50 MG PO TB24
50.0000 mg | ORAL_TABLET | Freq: Every day | ORAL | Status: DC
  Administered 2016-10-30: 50 mg via ORAL
  Filled 2016-10-28 (×2): qty 1

## 2016-10-28 MED ORDER — SODIUM CHLORIDE 0.9 % IV SOLN
Freq: Once | INTRAVENOUS | Status: AC
  Administered 2016-10-28: via INTRAVENOUS

## 2016-10-28 MED ORDER — LORAZEPAM 0.5 MG PO TABS: 0.5000 mg | ORAL_TABLET | Freq: Two times a day (BID) | ORAL | Status: DC

## 2016-10-28 MED ORDER — PANTOPRAZOLE SODIUM 40 MG IV SOLR
40.0000 mg | Freq: Two times a day (BID) | INTRAVENOUS | Status: DC
  Administered 2016-10-28: 40 mg via INTRAVENOUS
  Filled 2016-10-28 (×2): qty 40

## 2016-10-28 NOTE — ED Notes (Signed)
Assisted patient with urinal.

## 2016-10-28 NOTE — ED Notes (Signed)
Attempted report 

## 2016-10-28 NOTE — H&P (Signed)
History and Physical    [name] DJT:[id number] DOB: [birth year] DOA: 10/28/2016  PCP: [name], NP   Patient coming from: Home.  I have personally briefly reviewed patient's old medical records in [place]  Chief Complaint: Anemia.  HPI: [name] is a 83 y.o. male with medical history significant of anemia, history of A. fib, osteoarthritis, history of bladder cancer, dementia, chronic systolic heart failure, GERD who comes to the emergency department with complaints of progressively worse weakness for the past few weeks. He was seen yesterday by his geriatric nurse practitioner, who will order blood work and was found to have a hemoglobin level of 6.9, down from 10.0 g/dL on 10/06/2016. He has a history of dementia. He is not complaining of pain or any other symptoms at this time, but unable to provide further detail on review of systems due to memory loss.  ED Course: Initial vital signs in the emergency department showed temperature 97.71F, pulse 63, respirations 16, blood pressure 109/72 mmHg and O2 sat 98% on room air. His WBC was 4.2, hemoglobin 7.1 g/dL and platelets 180. CMP shows normal electrolytes and anion gap. His BUN was 21, creatinine 0.98 and glucose 102 mg/dL. Albumin was mildly low at 3.2 g/dL. Stool guaiac performed in the ED was positive.  Review of Systems: As per HPI otherwise 10 point review of systems negative.    Past Medical History:  Diagnosis Date  . Anemia    iron deficiency secondary to bleeding  . Arrhythmia    a fib  . Arthritis    "all over" (10/28/2016)  . Atrial fibrillation ([place])   . Bladder cancer (HCC)    BCG [name]  . CHF (congestive heart failure) ([place])   . Dementia    "he's had it for a number of years" (10/28/2016)  . ED (erectile dysfunction)   . Genital warts    recurrent. Not a problem  . GERD (gastroesophageal reflux disease)    treated with PPI  . Hard of hearing   . Hematochezia    has intermittent  problem  . History of blood transfusion    "related to low HgB" (10/28/2016)  . History of hiatal hernia    "years ago" (10/28/2016)  . Hypertension    mild  . Irritable bladder   . Memory disorder   . Neuropathy    burning discomfort feet/legs  . On home oxygen therapy    "2L at night" (10/28/2016)  . Pneumonia    "twice" (10/28/2016)  . Restless leg    treats with heat  . Type II diabetes mellitus (HCC)    NIDDM on oral medication    Past Surgical History:  Procedure Laterality Date  . APPENDECTOMY  1946  . CARDIAC CATHETERIZATION N/A 01/13/2015   Procedure: Pericardiocentesis;  Surgeon: Leonie Man, MD;  Location: [facility];  Service: Cardiovascular;  Laterality: N/A;  . CARDIOVASCULAR STRESS TEST  03/28/2003   EF 55%.  Normal cardiolite study  . JOINT REPLACEMENT  1990's   left TKR  . KNEE ARTHROSCOPY Right 1980's  . TONSILLECTOMY  1938     reports that he quit smoking about 41 years ago. His smoking use included Cigarettes. He has a 80.00 pack-year smoking history. He quit smokeless tobacco use about 38 years ago. His smokeless tobacco use included Chew. He reports that he does not drink alcohol or use drugs.  Allergies  Allergen Reactions  . Cephalexin Other (See Comments)  Unknown reaction  . Furacin [Nitrofurazone] Other (See Comments)    Unknown reaction  . Macrodantin Other (See Comments)    Unknown reaction  . Zestril [Lisinopril] Other (See Comments)    Unknown reaction    Family History  Problem Relation Age of Onset  . Kidney disease Mother   . Heart disease Mother   . Stroke Mother   . Cancer Father        prostate  . Mental illness Brother   . Diabetes Neg Hx   . COPD Neg Hx   . Heart attack Neg Hx   . Hypertension Neg Hx     Prior to Admission medications   Medication Sig Start Date End Date Taking? Authorizing Provider  aspirin 81 MG tablet Take 81 mg by mouth daily.   Yes [provider]  ferrous sulfate 325 (65 FE) MG  tablet Take 487.5 mg by mouth 2 (two) times daily.    Yes [provider]  folic acid (FOLVITE) 1 MG tablet Take 1 mg by mouth 2 (two) times daily.    Yes [provider]  furosemide (LASIX) 40 MG tablet Take 40-80 mg by mouth See admin instructions. Take 2 tablets (80 mg) by mouth in the morning ever day, take 1 tablet (40MG ) in the afternoon 10/15/16  Yes End, Harrell Gave, MD  hydrOXYzine (ATARAX/VISTARIL) 10 MG tablet Take 10 mg by mouth 2 (two) times daily.   Yes [provider]  LORazepam (ATIVAN) 0.5 MG tablet TAKE 1 TABLET TWICE DAILY AS NEEDED FOR ANXIETY. Patient taking differently: Take 0.5 mg by mouth 2 (two) times daily.  10/28/16  Yes Nafziger, Tommi Rumps, NP  Memantine HCl-Donepezil HCl (NAMZARIC) 28-10 MG CP24 Take 1 tablet by mouth every morning. 06/08/16  Yes Marcial Pacas, MD  metoprolol tartrate (LOPRESSOR) 25 MG tablet Take 0.5 tablets (12.5 mg total) by mouth 2 (two) times daily. 08/25/16  Yes Eileen Stanford, PA-C  mirabegron ER (MYRBETRIQ) 50 MG TB24 tablet Take 1 tablet (50 mg total) by mouth daily. 10/28/16  Yes Nafziger, Tommi Rumps, NP  Multiple Vitamin (MULTIVITAMIN WITH MINERALS) TABS tablet Take 1 tablet by mouth daily.   Yes [provider]  Multiple Vitamins-Minerals (PRESERVISION AREDS 2 PO) Take 1 tablet by mouth 2 (two) times daily.    Yes [provider]  nitroGLYCERIN (NITROSTAT) 0.4 MG SL tablet Place 1 tablet (0.4 mg total) under the tongue every 5 (five) minutes as needed for chest pain (x 3 doses daily). 01/07/16  Yes Nafziger, Tommi Rumps, NP  hydrOXYzine (ATARAX/VISTARIL) 10 MG tablet TAKE (1) TABLET TWICE A DAY AS NEEDED. 08/31/16   Nafziger, Tommi Rumps, NP  QUEtiapine (SEROQUEL) 25 MG tablet Take 1 tablet (25 mg total) by mouth at bedtime as needed. 09/03/16   Marcial Pacas, MD    Physical Exam: Vitals:   10/28/16 1930 10/28/16 1945 10/28/16 2000 10/28/16 2053  BP: 107/60 107/77 97/68 (!) 115/53  Pulse: 75 65 64 68  Resp: 17 19 17 17   Temp:     98.4 F (36.9 C)  TempSrc:    Oral  SpO2: 98% 93% 95% 94%    Constitutional: NAD, calm, comfortable Eyes: PERRL, lids and conjunctivae are pale ENMT: Mucous membranes are moist. Posterior pharynx clear of any exudate or lesions. Neck: normal, supple, no masses, no thyromegaly Respiratory: Decreased breath sounds on the bases, otherwise clear to auscultation bilaterally, no wheezing, no crackles. Normal respiratory effort. No accessory muscle use.  Cardiovascular: S1 and S2, Irregularly irregular rhythm, no  murmurs / rubs / gallops. 3 + lower extremity edema. 2+ pedal pulses. No carotid bruits.  Abdomen: no tenderness, no masses palpated. No hepatosplenomegaly. Bowel sounds positive.  Rectal: Melanotic stools per ED exam.  Musculoskeletal: no clubbing / cyanosis. No joint deformity upper and lower extremities. Good ROM, no contractures. Normal muscle tone.  Skin: Scattered small areas of ecchymosis. Neurologic: CN 2-12 grossly intact. Sensation intact, DTR normal. Strength 5/5 in all 4.  Psychiatric:  Alert and oriented x 1, partially oriented to place, time and situation.   Labs on Admission: I have personally reviewed following labs and imaging studies  CBC:  Recent Labs Lab 10/27/16 1438 10/28/16 1527 10/28/16 2121  WBC 3.4 4.2  --   HGB 6.9* 7.1* 6.7*  HCT 22.6* 24.1*  --   MCV 90 90.6  --   PLT 178 180  --    Basic Metabolic Panel:  Recent Labs Lab 10/27/16 1438 10/28/16 1527  NA 144 140  K 4.0 3.5  CL 106 106  CO2 26 27  GLUCOSE 109* 102*  BUN 22 21*  CREATININE 0.85 0.98  CALCIUM 8.4* 8.6*  MG  --  2.1   GFR: Estimated Creatinine Clearance: 65.3 mL/min (by C-G formula based on SCr of 0.98 mg/dL). Liver Function Tests:  Recent Labs Lab 10/28/16 1527  AST 19  ALT 15*  ALKPHOS 93  BILITOT 0.5  PROT 5.8*  ALBUMIN 3.2*   No results for input(s): LIPASE, AMYLASE in the last 168 hours. No results for input(s): AMMONIA in the last 168  hours. Coagulation Profile: No results for input(s): INR, PROTIME in the last 168 hours. Cardiac Enzymes: No results for input(s): CKTOTAL, CKMB, CKMBINDEX, TROPONINI in the last 168 hours. BNP (last 3 results)  Recent Labs  04/26/16 1519 08/25/16 1510  PROBNP 1,905* 1,266*   HbA1C: No results for input(s): HGBA1C in the last 72 hours. CBG: No results for input(s): GLUCAP in the last 168 hours. Lipid Profile: No results for input(s): CHOL, HDL, LDLCALC, TRIG, CHOLHDL, LDLDIRECT in the last 72 hours. Thyroid Function Tests: No results for input(s): TSH, T4TOTAL, FREET4, T3FREE, THYROIDAB in the last 72 hours. Anemia Panel: No results for input(s): VITAMINB12, FOLATE, FERRITIN, TIBC, IRON, RETICCTPCT in the last 72 hours. Urine analysis:    Component Value Date/Time   COLORURINE YELLOW 08/18/2015 [place] 08/18/2015 1725   LABSPEC 1.011 08/18/2015 1725   PHURINE 7.0 08/18/2015 1725   GLUCOSEU NEGATIVE 08/18/2015 1725   GLUCOSEU NEGATIVE 11/17/2012 1216   HGBUR NEGATIVE 08/18/2015 1725   BILIRUBINUR neg 10/06/2016 1543   KETONESUR NEGATIVE 08/18/2015 1725   PROTEINUR neg 10/06/2016 1543   PROTEINUR NEGATIVE 08/18/2015 1725   UROBILINOGEN 0.2 10/06/2016 1543   UROBILINOGEN 0.2 01/10/2015 1424   NITRITE neg 10/06/2016 1543   NITRITE NEGATIVE 08/18/2015 1725   LEUKOCYTESUR Negative 10/06/2016 1543    Radiological Exams on Admission: Dg Chest Portable 1 View  Result Date: 10/28/2016 CLINICAL DATA:  Weakness. EXAM: PORTABLE CHEST 1 VIEW COMPARISON:  07/29/2016 and prior radiographs FINDINGS: Cardiomegaly, left pleural effusion and left basilar opacity again noted. There is no evidence of pneumothorax. No interval changes are present. No acute bony abnormalities are identified. IMPRESSION: No significant change from the prior study with continued left pleural effusion and left basilar opacity/atelectasis. Electronically Signed   By: Margarette Canada M.D.   On:  10/28/2016 16:46  08/30/2016 Echocardiogram  ------------------------------------------------------------------- LV EF: 45% -   50%  ------------------------------------------------------------------- Indications:  Acute diastolic heart faliure (K27.06).  ------------------------------------------------------------------- History:   PMH:  Acquired from the patient and from the patient&'s chart.  Atrial fibrillation.  Risk factors:  Diabetes mellitus.  ------------------------------------------------------------------- Study Conclusions  - Left ventricle: The cavity size was normal. Systolic function was   mildly reduced. The estimated ejection fraction was in the range   of 45% to 50%. Wall motion was normal; there were no regional   wall motion abnormalities. The study was not technically   sufficient to allow evaluation of LV diastolic dysfunction due to   atrial fibrillation. - Ventricular septum: Septal motion showed paradox. - Aortic valve: There was mild regurgitation. - Mitral valve: Calcified annulus. Mildly thickened leaflets .   There was mild regurgitation. - Left atrium: The atrium was severely dilated. - Right ventricle: The cavity size was moderately dilated. Wall   thickness was normal. Systolic function was mildly reduced. - Right atrium: The atrium was severely dilated. - Tricuspid valve: There was moderate regurgitation. - Pulmonary arteries: Systolic pressure was mildly increased. PA   peak pressure: 39 mm Hg (S). - Inferior vena cava: The vessel was dilated. The respirophasic   diameter changes were blunted (< 50%), consistent with elevated   central venous pressure.  EKG: Independently reviewed. Vent. rate 84 BPM PR interval * ms QRS duration 132 ms QT/QTc 438/518 ms P-R-T axes * 71 34 Atrial fibrillation Nonspecific intraventricular conduction delay. RBBB not seen as clear as in previous tracing.  Assessment/Plan Principal Problem:   GI  bleed Admit to telemetry/inpatient. Keep nothing by mouth. Monitor hematocrit and hemoglobin. Transfuse 2 units of packed RBCs. Protonix 40 mg IVPB every 12 hours. GI will evaluate in the morning and at that time, his wife and son will decide on the risks and benefits of endoscopic studies.  Active Problems:   Atrial fibrillation (HCC) CHA?DS?-VASc Score of at least 5. Not on anticoagulation. Continue telemetry. Monitor electrolytes. Switch to metoprolol equivalent IV dose while nothing by mouth.    Diabetes mellitus (HCC) CBG monitoring every 6 hours while nothing by mouth. Switch to before meals and at bedtime once cleared by GI for oral intake.    Anemia, iron deficiency Anemia profile showed low iron, ferritin and saturation rate on 07/10/2014. Has been taking iron supplements at home. Monitor hematocrit and hemoglobin.    Dementia with behavioral disturbance Continue lorazepam 0.5 mg by mouth twice a day. Continue Seroquel 25 mg by mouth as needed at bedtime.    Hypertension Switch metoprolol to 2.5 mg IVPB every 6 hours Monitor blood pressure.    DVT prophylaxis: SCDs. Code Status: DNR/DNI. Family Communication: His wife and son were present in the emergency department. Disposition Plan: Admit for PRBC transfusion and GI evaluation in a.m. Consults called: Dr. Nyoka Cowden (GI) spoke to Dr. Massie Maroon (ED) earlier and will see in AM Admission status: Inpatient/telemetry.   Reubin Milan MD [facility] Pager [phone].  If 7PM-7AM, please contact night-coverage [email or web address] Password [email or web address]  10/28/2016, 10:20 PM

## 2016-10-28 NOTE — Telephone Encounter (Signed)
Preliminary result reviewed by Triage RN and sent to MD desktop for review.   Spoke with pt's wife [name] of lab results. RBC 2.50, Hemoglobin 6.9 Dr Angelena Form DOD aware and recommends for pt go to ER. Pt's wife  Is aware she  verbalized understanding.

## 2016-10-28 NOTE — ED Notes (Signed)
ED Provider at bedside. 

## 2016-10-28 NOTE — Telephone Encounter (Signed)
Last filled 09/24/16 Last seen 10/06/16 No future appointment scheduled Please advise.  Thanks!!

## 2016-10-28 NOTE — Telephone Encounter (Signed)
Ativan called to the pharmacy and left on machine. Myrbetriq filled for 6 months.

## 2016-10-28 NOTE — ED Notes (Signed)
Pt incontinent of urine. Peri care performed and pt changed into clean gown.

## 2016-10-28 NOTE — ED Provider Notes (Signed)
Level V caveat dementia history is obtained from patient's wife and son patientoutpatient lab work performed yesterday was noted to have hemoglobin of 6.9. Called by his primary care physician's office advised to come to the emergency department. one month. He complains of generalized weakness for one month. His son also reportsincreasing leg edema for approximate the past 1 month. His Lasix dose was decreased recently due to weight gain and increased again a few days ago. Patient presently denies any complaint denies shortness breath denies pain anywhere. On exam frail-appearing alert lungs clear auscultation heart regular rate and rhythm abdomen nondistended nontender bilateral lower extremities with 3+ pretibial pitting edema. I had discussed with patient's wife and son he is a DO NOT RESUSCITATE CODE STATUS. Palliative care consult called   Orlie Dakin, MD 10/29/16 [phone]

## 2016-10-28 NOTE — Progress Notes (Signed)
CRITICAL VALUE ALERT  Critical Value:  Hgb 6.7  Date & Time Notied:  10/28/16 at 2210  Provider Notified: Ortiz,MD  Orders Received/Actions taken: MD returned page and orders already placed for 2 units of blood. Will continue to monitor and treat per MD orders.

## 2016-10-28 NOTE — Telephone Encounter (Signed)
Ok to refill Ativan for 30 days and Myrbetriq for six months

## 2016-10-28 NOTE — ED Provider Notes (Signed)
[facility] DEPT Provider Note   CSN: [id number] Arrival date & time: 10/28/16  1433     History   Chief Complaint Chief Complaint  Patient presents with  . Anemia    HPI [name] is a 83 y.o. male. With history of dementia, systolic heart failure, permanent A. fib not on anticoagulation secondary to recurrent GI bleeds, bladder cancer, GERD who presents with symptoms of weakness and anemia. Patient was seen at routine follow-up with his geriatric nurse practitioner yesterday, was found to have hemoglobin level 6.9 down from 10.1 7/18. Patient lives with his wife at home, where they've 24-hour nursing assistance. She reports patient has been more weak than usual. They do not recall if his previous GI bleeds or upper or lower, however patient has required blood transfusion in the past for GI bleed, however nothing in the past year. Patient denies any hematemesis, hemoptysis, BRBPR. He has had dark stools, however they felt this was secondary to iron supplementation. Patient denies any current complaints.  History obtained from wife and son, as patient has dementia.   HPI  Past Medical History:  Diagnosis Date  . Anemia    iron deficiency secondary to bleeding  . Arrhythmia    a fib  . Arthritis    DJD knee - left  . Bladder cancer (HCC)    BCG [name]  . Diabetes mellitus    NIDDM on oral medication  . ED (erectile dysfunction)   . Genital warts    recurrent. Not a problem  . GERD (gastroesophageal reflux disease)    treated with PPI  . Hard of hearing   . Hematochezia    has intermittent problem  . Hypertension    mild  . Irritable bladder   . Memory disorder   . Neuropathy    burning discomfort feet/legs  . Restless leg    treats with heat    Patient Active Problem List   Diagnosis Date Noted  . GI bleed 10/28/2016  . (HFpEF) heart failure with preserved ejection fraction ([place]) 10/16/2016  . RBBB 10/16/2016  . Dementia with behavioral disturbance  08/20/2015  . Pleural effusion 02/26/2015  . S/P thoracentesis   . Acute pericarditis   . Pericardial effusion without cardiac tamponade   . Pericardial effusion with cardiac tamponade 01/12/2015  . Atrial fibrillation with RVR ([place])   . Prolonged Q-T interval on ECG 01/10/2015  . Chronic anemia 01/10/2015  . Chronic systolic congestive heart failure ([place]) 01/10/2015  . Acute encephalopathy 01/10/2015  . Anemia, iron deficiency   . Cough 07/09/2014  . Allergic rhinitis 07/09/2014  . Fatigue 07/09/2014  . Acute on chronic diastolic CHF (congestive heart failure) ([place]) 12/18/2013  . Abnormality of gait 05/28/2013  . Chronic venous insufficiency 04/04/2013  . Mitral regurgitation 08/26/2011  . Heart murmur 04/15/2011  . Atrial fibrillation ([place]) 09/01/2010  . Iron deficiency anemia due to chronic blood loss 09/01/2010  . Diabetes mellitus ([place]) 09/01/2010  . Dementia 09/01/2010  . Benign hypertensive heart disease without heart failure 09/01/2010    Past Surgical History:  Procedure Laterality Date  . APPENDECTOMY  1946  . CARDIAC CATHETERIZATION N/A 01/13/2015   Procedure: Pericardiocentesis;  Surgeon: Leonie Man, MD;  Location: [facility];  Service: Cardiovascular;  Laterality: N/A;  . CARDIOVASCULAR STRESS TEST  03/28/2003   EF 55%.  Normal cardiolite study  . JOINT REPLACEMENT  1990's   left TKR  . KNEE ARTHROSCOPY  1980's   right  knee  . TONSILLECTOMY  1938       Home Medications    Prior to Admission medications   Medication Sig Start Date End Date Taking? Authorizing Provider  aspirin 81 MG tablet Take 81 mg by mouth daily.   Yes [provider]  ferrous sulfate 325 (65 FE) MG tablet Take 487.5 mg by mouth 2 (two) times daily.    Yes [provider]  folic acid (FOLVITE) 1 MG tablet Take 1 mg by mouth 2 (two) times daily.    Yes [provider]  furosemide (LASIX) 40 MG tablet Take 40-80 mg by mouth See admin instructions.  Take 2 tablets (80 mg) by mouth in the morning ever day, take 1 tablet (40MG ) in the afternoon 10/15/16  Yes End, Harrell Gave, MD  hydrOXYzine (ATARAX/VISTARIL) 10 MG tablet Take 10 mg by mouth 2 (two) times daily.   Yes [provider]  LORazepam (ATIVAN) 0.5 MG tablet TAKE 1 TABLET TWICE DAILY AS NEEDED FOR ANXIETY. Patient taking differently: Take 0.5 mg by mouth 2 (two) times daily.  10/28/16  Yes Nafziger, Tommi Rumps, NP  Memantine HCl-Donepezil HCl (NAMZARIC) 28-10 MG CP24 Take 1 tablet by mouth every morning. 06/08/16  Yes Marcial Pacas, MD  metoprolol tartrate (LOPRESSOR) 25 MG tablet Take 0.5 tablets (12.5 mg total) by mouth 2 (two) times daily. 08/25/16  Yes Eileen Stanford, PA-C  mirabegron ER (MYRBETRIQ) 50 MG TB24 tablet Take 1 tablet (50 mg total) by mouth daily. 10/28/16  Yes Nafziger, Tommi Rumps, NP  Multiple Vitamin (MULTIVITAMIN WITH MINERALS) TABS tablet Take 1 tablet by mouth daily.   Yes [provider]  Multiple Vitamins-Minerals (PRESERVISION AREDS 2 PO) Take 1 tablet by mouth 2 (two) times daily.    Yes [provider]  nitroGLYCERIN (NITROSTAT) 0.4 MG SL tablet Place 1 tablet (0.4 mg total) under the tongue every 5 (five) minutes as needed for chest pain (x 3 doses daily). 01/07/16  Yes Nafziger, Tommi Rumps, NP  hydrOXYzine (ATARAX/VISTARIL) 10 MG tablet TAKE (1) TABLET TWICE A DAY AS NEEDED. 08/31/16   Nafziger, Tommi Rumps, NP  QUEtiapine (SEROQUEL) 25 MG tablet Take 1 tablet (25 mg total) by mouth at bedtime as needed. 09/03/16   Marcial Pacas, MD    Family History Family History  Problem Relation Age of Onset  . Kidney disease Mother   . Heart disease Mother   . Stroke Mother   . Cancer Father        prostate  . Mental illness Brother   . Diabetes Neg Hx   . COPD Neg Hx   . Heart attack Neg Hx   . Hypertension Neg Hx     Social History Social History  Substance Use Topics  . Smoking status: Former Smoker    Quit date: 08/28/1975  . Smokeless tobacco: Former Systems developer  .  Alcohol use No     Allergies   Cephalexin; Furacin [nitrofurazone]; Macrodantin; and Zestril [lisinopril]   Review of Systems Review of Systems  Constitutional: Negative for chills and fever.  HENT: Negative for ear pain and sore throat.   Eyes: Negative for pain and visual disturbance.  Respiratory: Negative for cough and shortness of breath.   Cardiovascular: Negative for chest pain and palpitations.  Gastrointestinal: Negative for abdominal pain and vomiting.       Dark stools  Genitourinary: Negative for dysuria and hematuria.  Musculoskeletal: Negative for arthralgias and back pain.  Skin: Negative for color change and rash.  Neurological: Positive for weakness.  Negative for seizures and syncope.  All other systems reviewed and are negative.    Physical Exam Updated Vital Signs BP (!) 112/51   Pulse 78   Temp 97.9 F (36.6 C) (Oral)   Resp 20   SpO2 97%   Physical Exam  Constitutional: He appears well-developed and well-nourished.  HENT:  Head: Normocephalic and atraumatic.  Eyes: Conjunctivae are normal.  Neck: Neck supple.  Cardiovascular: Normal rate and regular rhythm.   No murmur heard. Pulmonary/Chest: Effort normal and breath sounds normal. No respiratory distress.  Abdominal: Soft. There is no tenderness.  Genitourinary: Rectal exam shows guaiac positive stool.  Genitourinary Comments: Black stool per rectum  Musculoskeletal: He exhibits edema (bilateral pitting edema to level of thighs). He exhibits no tenderness.  Neurological: He is alert.  Skin: Skin is warm and dry.  Psychiatric: He has a normal mood and affect.  Nursing note and vitals reviewed.    ED Treatments / Results  Labs (all labs ordered are listed, but only abnormal results are displayed) Labs Reviewed  COMPREHENSIVE METABOLIC PANEL - Abnormal; Notable for the following:       Result Value   Glucose, Bld 102 (*)    BUN 21 (*)    Calcium 8.6 (*)    Total Protein 5.8 (*)     Albumin 3.2 (*)    ALT 15 (*)    All other components within normal limits  CBC - Abnormal; Notable for the following:    RBC 2.66 (*)    Hemoglobin 7.1 (*)    HCT 24.1 (*)    MCHC 29.5 (*)    RDW 16.3 (*)    All other components within normal limits  POC OCCULT BLOOD, ED - Abnormal; Notable for the following:    Fecal Occult Bld POSITIVE (*)    All other components within normal limits  MAGNESIUM  TYPE AND SCREEN  PREPARE RBC (CROSSMATCH)    EKG  EKG Interpretation None       Radiology Dg Chest Portable 1 View  Result Date: 10/28/2016 CLINICAL DATA:  Weakness. EXAM: PORTABLE CHEST 1 VIEW COMPARISON:  07/29/2016 and prior radiographs FINDINGS: Cardiomegaly, left pleural effusion and left basilar opacity again noted. There is no evidence of pneumothorax. No interval changes are present. No acute bony abnormalities are identified. IMPRESSION: No significant change from the prior study with continued left pleural effusion and left basilar opacity/atelectasis. Electronically Signed   By: Margarette Canada M.D.   On: 10/28/2016 16:46    Procedures Procedures (including critical care time)  Medications Ordered in ED Medications  0.9 %  sodium chloride infusion (not administered)     Initial Impression / Assessment and Plan / ED Course  I have reviewed the triage vital signs and the nursing notes.  Pertinent labs & imaging results that were available during my care of the patient were reviewed by me and considered in my medical decision making (see chart for details).     Patient is a 83 y/o male with history of recurrent GI bleeds who presents with anemia and weakenss. Patient arrived hemodynamically stable, in no acute distress. Examined above, hemoccult positive.  Hemoglobin 7.1 on repeat labs, down from 10.1 on 7/18. EKG stable from previous, atrial fibrillation. No obvious blood loss from history, although dark black stool on my examination with positive Hemoccult reported  history of recurrent GI bleeds. Patient remained hemodynamically stable in ED. Discussion with family at bedside, patient is DO NOT RESUSCITATE, however would be  open to colonoscopy and transfusion if needed.  Discussed with hospitalist for admission. Given multiple medical comorbidities, and DNR status, palliative consult also ordered. Consulted gastroenterology.   Family and agreement with plan at time of admission.  Patient and plan of care discussed with Attending physician, Dr. Winfred Leeds.    Final Clinical Impressions(s) / ED Diagnoses   Final diagnoses:  Symptomatic anemia  Melena    New Prescriptions New Prescriptions   No medications on file     Arnetha Massy, MD 10/28/16 [place], [place], MD 10/29/16 [phone]

## 2016-10-29 ENCOUNTER — Telehealth: Payer: Self-pay

## 2016-10-29 ENCOUNTER — Telehealth: Payer: Self-pay | Admitting: Adult Health

## 2016-10-29 DIAGNOSIS — D649 Anemia, unspecified: Secondary | ICD-10-CM

## 2016-10-29 DIAGNOSIS — Z515 Encounter for palliative care: Secondary | ICD-10-CM

## 2016-10-29 DIAGNOSIS — I10 Essential (primary) hypertension: Secondary | ICD-10-CM

## 2016-10-29 DIAGNOSIS — K921 Melena: Secondary | ICD-10-CM

## 2016-10-29 DIAGNOSIS — F0391 Unspecified dementia with behavioral disturbance: Secondary | ICD-10-CM

## 2016-10-29 DIAGNOSIS — I5022 Chronic systolic (congestive) heart failure: Secondary | ICD-10-CM

## 2016-10-29 DIAGNOSIS — I482 Chronic atrial fibrillation: Secondary | ICD-10-CM

## 2016-10-29 DIAGNOSIS — F0151 Vascular dementia with behavioral disturbance: Secondary | ICD-10-CM

## 2016-10-29 DIAGNOSIS — D500 Iron deficiency anemia secondary to blood loss (chronic): Secondary | ICD-10-CM

## 2016-10-29 LAB — BASIC METABOLIC PANEL
Anion gap: 7 (ref 5–15)
BUN: 16 mg/dL (ref 6–20)
CALCIUM: 8.4 mg/dL — AB (ref 8.9–10.3)
CO2: 26 mmol/L (ref 22–32)
CREATININE: 0.87 mg/dL (ref 0.61–1.24)
Chloride: 111 mmol/L (ref 101–111)
Glucose, Bld: 91 mg/dL (ref 65–99)
Potassium: 3.5 mmol/L (ref 3.5–5.1)
SODIUM: 144 mmol/L (ref 135–145)

## 2016-10-29 LAB — CBC
HEMATOCRIT: 26.8 % — AB (ref 39.0–52.0)
Hemoglobin: 8.1 g/dL — ABNORMAL LOW (ref 13.0–17.0)
MCH: 27 pg (ref 26.0–34.0)
MCHC: 30.2 g/dL (ref 30.0–36.0)
MCV: 89.3 fL (ref 78.0–100.0)
PLATELETS: 145 10*3/uL — AB (ref 150–400)
RBC: 3 MIL/uL — ABNORMAL LOW (ref 4.22–5.81)
RDW: 17.2 % — AB (ref 11.5–15.5)
WBC: 3.5 10*3/uL — ABNORMAL LOW (ref 4.0–10.5)

## 2016-10-29 LAB — GLUCOSE, CAPILLARY
GLUCOSE-CAPILLARY: 98 mg/dL (ref 65–99)
Glucose-Capillary: 89 mg/dL (ref 65–99)
Glucose-Capillary: 83 mg/dL (ref 65–99)

## 2016-10-29 MED ORDER — LORAZEPAM 2 MG/ML IJ SOLN: 0.5000 mg | Freq: Once | INTRAMUSCULAR | Status: DC

## 2016-10-29 MED ORDER — FUROSEMIDE 20 MG PO TABS
20.0000 mg | ORAL_TABLET | Freq: Two times a day (BID) | ORAL | Status: DC
  Administered 2016-10-29 – 2016-10-30 (×2): 20 mg via ORAL
  Filled 2016-10-29 (×2): qty 1

## 2016-10-29 MED ORDER — MEMANTINE HCL-DONEPEZIL HCL ER 28-10 MG PO CP24: 1.0000 | ORAL_CAPSULE | ORAL | Status: DC

## 2016-10-29 MED ORDER — METOPROLOL TARTRATE 12.5 MG HALF TABLET
12.5000 mg | ORAL_TABLET | Freq: Two times a day (BID) | ORAL | Status: DC
  Administered 2016-10-29 (×2): 12.5 mg via ORAL
  Filled 2016-10-29 (×3): qty 1

## 2016-10-29 MED ORDER — MEMANTINE HCL ER 28 MG PO CP24
28.0000 mg | ORAL_CAPSULE | Freq: Every day | ORAL | Status: DC
  Administered 2016-10-30: 28 mg via ORAL
  Filled 2016-10-29: qty 1

## 2016-10-29 MED ORDER — PANTOPRAZOLE SODIUM 40 MG PO TBEC
40.0000 mg | DELAYED_RELEASE_TABLET | Freq: Two times a day (BID) | ORAL | Status: DC
  Administered 2016-10-29 – 2016-10-30 (×2): 40 mg via ORAL
  Filled 2016-10-29 (×2): qty 1

## 2016-10-29 MED ORDER — LORAZEPAM 0.5 MG PO TABS: 0.5000 mg | ORAL_TABLET | Freq: Two times a day (BID) | ORAL | Status: DC | PRN

## 2016-10-29 MED ORDER — DONEPEZIL HCL 10 MG PO TABS
10.0000 mg | ORAL_TABLET | Freq: Every day | ORAL | Status: DC
  Administered 2016-10-30: 10 mg via ORAL
  Filled 2016-10-29: qty 1

## 2016-10-29 NOTE — Progress Notes (Signed)
[facility] PROGRESS NOTE  [name] TGG:[id number] DOB: 1927/06/28 DOA: 10/28/2016 PCP: [name], NP  Interim summary and HPI 83 y.o. male with medical history significant of anemia, history of A. fib, osteoarthritis, history of bladder cancer, dementia, chronic systolic heart failure, GERD who comes to the emergency department with complaints of progressively worse weakness for the past few weeks. He was seen yesterday by his geriatric nurse practitioner, who will order blood work and was found to have a hemoglobin level of 6.9, down from 10.0 g/dL on 10/06/2016.  Assessment/Plan: 1-symptomatic anemia: acute on chronic iron deficiency from GIB. -after discussing with son, patient had hx of recurrent GIB and has required transfusion in the past in multiple occasion. -for now will continue conservative approach and will hold on invasive work up -patient had 2 units given on admission and his Hgb is 8.1 today -no acute bleeding appreciated -will advance diet and follow CBC in am -continue holding ASA  2-atrial fibrillation  -CHADsVASC score 4-5 -Not a candidate for anticoagulation  -patient chronically on A. Fib, rate controlled -will resume lopressor  -holding ASA for now -will d/c telemetry   3-HTN -soft on admission, but rising now -will resume home metoprolol dose -monitor VS  4-dementia -advanced -will continue lorazepam and seroquel -continue supportive care -continue namenda/aricept  5-chronic systolic HF -last EF 70-35% from June 2018 -will continue metoprolol -judicious volume repletion (considering expansion with transfusion) and resumption of lasix   Code Status: DNR Family Communication: Son by phone; no family at bedside  Disposition Plan: discussed with Son over the phone and will like to observe overnight for potential need of further transfusion. They will like to hold on invasive wok up for now.   Consultants:  GI  Procedures:  None    Antibiotics:  None   HPI/Subjective: Afebrile, no CP and no SOB. Pleasantly confused and oriented to person only. Patient trying to get out of bed and has pulled to IV's already.  Objective: Vitals:   10/29/16 0733 10/29/16 1340  BP: (!) 109/50 131/70  Pulse: 63 98  Resp:  20  Temp: (!) 97.5 F (36.4 C) 98.4 F (36.9 C)  SpO2: 99% 91%    Intake/Output Summary (Last 24 hours) at 10/29/16 1548 Last data filed at 10/29/16 0945  Gross per 24 hour  Intake              650 ml  Output              300 ml  Net              350 ml   There were no vitals filed for this visit.  Exam:   General: afebrile, no CP, no SOB. Patient is pleasantly confused and oriented X 1 only. He is trying to get out of bed and Has pulled 2 IV's already.  Cardiovascular: irregular, no rubs, no gallops  Respiratory: good air movement, no wheezing   Abdomen: soft, NT, ND, positive BS  Musculoskeletal: trace edema bilaterally, no cyanosis   Data Reviewed: Basic Metabolic Panel:  Recent Labs Lab 10/27/16 1438 10/28/16 1527 10/29/16 0750  NA 144 140 144  K 4.0 3.5 3.5  CL 106 106 111  CO2 26 27 26   GLUCOSE 109* 102* 91  BUN 22 21* 16  CREATININE 0.85 0.98 0.87  CALCIUM 8.4* 8.6* 8.4*  MG  --  2.1  --    Liver Function Tests:  Recent Labs Lab 10/28/16 1527  AST  19  ALT 15*  ALKPHOS 93  BILITOT 0.5  PROT 5.8*  ALBUMIN 3.2*   CBC:  Recent Labs Lab 10/27/16 1438 10/28/16 1527 10/28/16 2121 10/29/16 0750  WBC 3.4 4.2  --  3.5*  HGB 6.9* 7.1* 6.7* 8.1*  HCT 22.6* 24.1*  --  26.8*  MCV 90 90.6  --  89.3  PLT 178 180  --  145*    ProBNP (last 3 results)  Recent Labs  04/26/16 1519 08/25/16 1510  PROBNP 1,905* 1,266*    CBG:  Recent Labs Lab 10/28/16 2326 10/29/16 0708 10/29/16 1239  GLUCAP 111* 89 83    Studies: Dg Chest Portable 1 View  Result Date: 10/28/2016 CLINICAL DATA:  Weakness. EXAM: PORTABLE CHEST 1 VIEW COMPARISON:  07/29/2016 and prior  radiographs FINDINGS: Cardiomegaly, left pleural effusion and left basilar opacity again noted. There is no evidence of pneumothorax. No interval changes are present. No acute bony abnormalities are identified. IMPRESSION: No significant change from the prior study with continued left pleural effusion and left basilar opacity/atelectasis. Electronically Signed   By: Margarette Canada M.D.   On: 10/28/2016 16:46    Scheduled Meds: . LORazepam  0.5 mg Intravenous Once  . metoprolol tartrate  12.5 mg Oral BID  . mirabegron ER  50 mg Oral Daily  . pantoprazole (PROTONIX) IV  40 mg Intravenous Q12H   Continuous Infusions: . sodium chloride 10 mL/hr at 10/29/16 0354    Principal Problem:   GI bleed Active Problems:   Atrial fibrillation (HCC)   Diabetes mellitus ([place])   Anemia, iron deficiency   Dementia with behavioral disturbance   Hypertension    Time spent: 25 minutes    Barton Dubois  [facility] Pager [phone]. If 7PM-7AM, please contact night-coverage at [email or web address], password [facility] 10/29/2016, 3:48 PM  LOS: 1 day

## 2016-10-29 NOTE — Consult Note (Signed)
[name] Reason for consult: Anemia, Stool positive for blood Referring Physician: [facility]. PCP: [name], NP. Primary G.I.: Dr. Dennie [name] is an 83 y.o. male.  HPI: Eduardo lives at home with assistance has a history of significant dementia systolic heart failure. Eduardo has been an atrial fib for some time but is not anticoagulated and follows with cardiology on regular basis. Long discussion today with his son Eduardo Hayward, [name] has had chronicanemia and has required transfusion in the past with stools reported to be positive for blood. Eduardo has been on chronic iron in his stools are always dark. Eduardo is gone for at least a year or so without any requirements for transfusion. Eduardo presented with weakness and anemia with hemoglobin down to 6.9. Eduardo has been transfused.We were called to see him to see if endoscopic evaluation would be normal. The [name]'s stools are chronically dark were tested in the emergency room and were positive for FOB. There is no history of NSAID use. Eduardo has a history of colon polyps would last colonoscopyDr. Hayes 2010. Due to his age Eduardo was not placed on his surveillance schedule for repeat colonoscopy. Eduardo has a multitude of other problems includingatrial fibrillation without anticoagulation,significant dementia ith severe memory issues, neuropathy, history of [name], history of bladder cancer.  Past Medical History:  Diagnosis Date  . Anemia    iron deficiency secondary to bleeding  . Arrhythmia    a fib  . Arthritis    "all over" (10/28/2016)  . Atrial fibrillation ([place])   . Bladder cancer (HCC)    BCG [name]  . CHF (congestive heart failure) ([place])   . Dementia    "Eduardo's had it for a number of years" (10/28/2016)  . ED (erectile dysfunction)   . Genital warts    recurrent. Not a problem  . GERD (gastroesophageal reflux disease)    treated with PPI  . Hard of hearing   . Hematochezia    has intermittent problem  . History of blood  transfusion    "related to low HgB" (10/28/2016)  . History of hiatal hernia    "years ago" (10/28/2016)  . Hypertension    mild  . Irritable bladder   . Memory disorder   . Neuropathy    burning discomfort feet/legs  . On home oxygen therapy    "2L at night" (10/28/2016)  . Pneumonia    "twice" (10/28/2016)  . Restless leg    treats with heat  . Type II diabetes mellitus (HCC)    NIDDM on oral medication    Past Surgical History:  Procedure Laterality Date  . APPENDECTOMY  1946  . CARDIAC CATHETERIZATION N/A 01/13/2015   Procedure: Pericardiocentesis;  Surgeon: Leonie Man, MD;  Location: [facility];  Service: Cardiovascular;  Laterality: N/A;  . CARDIOVASCULAR STRESS TEST  03/28/2003   EF 55%.  Normal cardiolite study  . JOINT REPLACEMENT  1990's   left TKR  . KNEE ARTHROSCOPY Right 1980's  . TONSILLECTOMY  1938    Family History  Problem Relation Age of Onset  . Kidney disease Mother   . Heart disease Mother   . Stroke Mother   . Cancer Father        prostate  . Mental illness Brother   . Diabetes Neg Hx   . COPD Neg Hx   . Heart attack Neg Hx   . Hypertension Neg Hx     Social History:  reports  that Eduardo quit smoking about 41 years ago. His smoking use included Cigarettes. Eduardo has a 80.00 pack-year smoking history. Eduardo quit smokeless tobacco use about 38 years ago. His smokeless tobacco use included Chew. Eduardo reports that Eduardo does not drink alcohol or use drugs.  Allergies:  Allergies  Allergen Reactions  . Cephalexin Other (See Comments)    Unknown reaction  . Furacin [Nitrofurazone] Other (See Comments)    Unknown reaction  . Macrodantin Other (See Comments)    Unknown reaction  . Zestril [Lisinopril] Other (See Comments)    Unknown reaction    Medications; Prior to Admission medications   Medication Sig Start Date End Date Taking? Authorizing Provider  aspirin 81 MG tablet Take 81 mg by mouth daily.   Yes [provider]  ferrous sulfate  325 (65 FE) MG tablet Take 487.5 mg by mouth 2 (two) times daily.    Yes [provider]  folic acid (FOLVITE) 1 MG tablet Take 1 mg by mouth 2 (two) times daily.    Yes [provider]  furosemide (LASIX) 40 MG tablet Take 40-80 mg by mouth See admin instructions. Take 2 tablets (80 mg) by mouth in the morning ever day, take 1 tablet (40MG) in the afternoon 10/15/16  Yes End, Harrell Gave, MD  hydrOXYzine (ATARAX/VISTARIL) 10 MG tablet Take 10 mg by mouth 2 (two) times daily.   Yes [provider]  LORazepam (ATIVAN) 0.5 MG tablet TAKE 1 TABLET TWICE DAILY AS NEEDED FOR ANXIETY. [name] taking differently: Take 0.5 mg by mouth 2 (two) times daily.  10/28/16  Yes Nafziger, Tommi Rumps, NP  Memantine HCl-Donepezil HCl (NAMZARIC) 28-10 MG CP24 Take 1 tablet by mouth every morning. 06/08/16  Yes Marcial Pacas, MD  metoprolol tartrate (LOPRESSOR) 25 MG tablet Take 0.5 tablets (12.5 mg total) by mouth 2 (two) times daily. 08/25/16  Yes Eileen Stanford, PA-C  mirabegron ER (MYRBETRIQ) 50 MG TB24 tablet Take 1 tablet (50 mg total) by mouth daily. 10/28/16  Yes Nafziger, Tommi Rumps, NP  Multiple Vitamin (MULTIVITAMIN WITH MINERALS) TABS tablet Take 1 tablet by mouth daily.   Yes [provider]  Multiple Vitamins-Minerals (PRESERVISION AREDS 2 PO) Take 1 tablet by mouth 2 (two) times daily.    Yes [provider]  nitroGLYCERIN (NITROSTAT) 0.4 MG SL tablet Place 1 tablet (0.4 mg total) under the tongue every 5 (five) minutes as needed for chest pain (x 3 doses daily). 01/07/16  Yes Nafziger, Tommi Rumps, NP  hydrOXYzine (ATARAX/VISTARIL) 10 MG tablet TAKE (1) TABLET TWICE A DAY AS NEEDED. 08/31/16   Nafziger, Tommi Rumps, NP  QUEtiapine (SEROQUEL) 25 MG tablet Take 1 tablet (25 mg total) by mouth at bedtime as needed. 09/03/16   Marcial Pacas, MD   . LORazepam  0.5 mg Intravenous Once  . metoprolol tartrate  2.5 mg Intravenous Q6H  . mirabegron ER  50 mg Oral Daily  . pantoprazole (PROTONIX) IV  40 mg  Intravenous Q12H   PRN Meds LORazepam, nitroGLYCERIN, prochlorperazine, QUEtiapine Results for orders placed or performed during the hospital encounter of 10/28/16 (from the past 48 hour(s))  Type and screen [place]     Status: None (Preliminary result)   Collection Time: 10/28/16  3:14 PM  Result Value Ref Range   ABO/RH(D) A POS    Antibody Screen NEG    Sample Expiration 10/31/2016    Unit Number [id number]    Blood Component Type RED CELLS,LR    Unit division 00  Status of Unit ISSUED,FINAL    Transfusion Status OK TO TRANSFUSE    Crossmatch Result Compatible    Unit Number [id number]    Blood Component Type RED CELLS,LR    Unit division 00    Status of Unit ISSUED    Transfusion Status OK TO TRANSFUSE    Crossmatch Result Compatible   Comprehensive metabolic panel     Status: Abnormal   Collection Time: 10/28/16  3:27 PM  Result Value Ref Range   Sodium 140 135 - 145 mmol/L   Potassium 3.5 3.5 - 5.1 mmol/L   Chloride 106 101 - 111 mmol/L   CO2 27 22 - 32 mmol/L   Glucose, Bld 102 (H) 65 - 99 mg/dL   BUN 21 (H) 6 - 20 mg/dL   Creatinine, Ser 0.98 0.61 - 1.24 mg/dL   Calcium 8.6 (L) 8.9 - 10.3 mg/dL   Total Protein 5.8 (L) 6.5 - 8.1 g/dL   Albumin 3.2 (L) 3.5 - 5.0 g/dL   AST 19 15 - 41 U/L   ALT 15 (L) 17 - 63 U/L   Alkaline Phosphatase 93 38 - 126 U/L   Total Bilirubin 0.5 0.3 - 1.2 mg/dL   GFR calc non Af Amer >60 >60 mL/min   GFR calc Af Amer >60 >60 mL/min    Comment: (NOTE) The eGFR has been calculated using the CKD EPI equation. This calculation has not been validated in all clinical situations. eGFR's persistently <60 mL/min signify possible Chronic Kidney Disease.    Anion gap 7 5 - 15  CBC     Status: Abnormal   Collection Time: 10/28/16  3:27 PM  Result Value Ref Range   WBC 4.2 4.0 - 10.5 K/uL   RBC 2.66 (L) 4.22 - 5.81 MIL/uL   Hemoglobin 7.1 (L) 13.0 - 17.0 g/dL   HCT 24.1 (L) 39.0 - 52.0 %   MCV 90.6 78.0 - 100.0  fL   MCH 26.7 26.0 - 34.0 pg   MCHC 29.5 (L) 30.0 - 36.0 g/dL   RDW 16.3 (H) 11.5 - 15.5 %   Platelets 180 150 - 400 K/uL  Magnesium     Status: None   Collection Time: 10/28/16  3:27 PM  Result Value Ref Range   Magnesium 2.1 1.7 - 2.4 mg/dL  POC occult blood, ED     Status: Abnormal   Collection Time: 10/28/16  4:19 PM  Result Value Ref Range   Fecal Occult Bld POSITIVE (A) NEGATIVE  Prepare RBC     Status: None   Collection Time: 10/28/16  7:06 PM  Result Value Ref Range   Order Confirmation ORDER PROCESSED BY BLOOD BANK   Hemoglobin     Status: Abnormal   Collection Time: 10/28/16  9:21 PM  Result Value Ref Range   Hemoglobin 6.7 (LL) 13.0 - 17.0 g/dL    Comment: REPEATED TO VERIFY CRITICAL RESULT CALLED TO, READ BACK BY AND VERIFIED WITH: C.COBLE,RN 2211 10/28/16 G.MCADOO   Glucose, capillary     Status: Abnormal   Collection Time: 10/28/16 11:26 PM  Result Value Ref Range   Glucose-Capillary 111 (H) 65 - 99 mg/dL  Glucose, capillary     Status: None   Collection Time: 10/29/16  7:08 AM  Result Value Ref Range   Glucose-Capillary 89 65 - 99 mg/dL  Basic metabolic panel     Status: Abnormal   Collection Time: 10/29/16  7:50 AM  Result Value Ref Range   Sodium 144 135 -   145 mmol/L   Potassium 3.5 3.5 - 5.1 mmol/L   Chloride 111 101 - 111 mmol/L   CO2 26 22 - 32 mmol/L   Glucose, Bld 91 65 - 99 mg/dL   BUN 16 6 - 20 mg/dL   Creatinine, Ser 0.87 0.61 - 1.24 mg/dL   Calcium 8.4 (L) 8.9 - 10.3 mg/dL   GFR calc non Af Amer >60 >60 mL/min   GFR calc Af Amer >60 >60 mL/min    Comment: (NOTE) The eGFR has been calculated using the CKD EPI equation. This calculation has not been validated in all clinical situations. eGFR's persistently <60 mL/min signify possible Chronic Kidney Disease.    Anion gap 7 5 - 15    Dg Chest Portable 1 View  Result Date: 10/28/2016 CLINICAL DATA:  Weakness. EXAM: PORTABLE CHEST 1 VIEW COMPARISON:  07/29/2016 and prior radiographs FINDINGS:  Cardiomegaly, left pleural effusion and left basilar opacity again noted. There is no evidence of pneumothorax. No interval changes are present. No acute bony abnormalities are identified. IMPRESSION: No significant change from the prior study with continued left pleural effusion and left basilar opacity/atelectasis. Electronically Signed   By: Margarette Canada M.D.   On: 10/28/2016 16:46   ROS:  Unobtainable from [name]. Review of systems from his son, Eduardo Hayward, Eduardo's had no chest pain has had some slight cough and has chronic dark stools. Eduardo's had no complaints about his bowel movements. His main symptoms have recorded progressive weakness and swelling in his extremities which has been treated by his cardiologists.            Blood pressure (!) 109/50, pulse 63, temperature (!) 97.5 F (36.4 C), temperature source Oral, resp. rate 18, height 6' 3" (1.905 m), SpO2 99 %.  Physical exam:   General-- frail elderly white male ENT--nonicteric Neck--very stiff difficult to move Heart--irregular Lungs-- clear Abdomen--soft and nontender. Psych--confused unable to answer any questions.  Assessment: 1. Anemia/stool + for FOB. Long discussion with [name]'s son, Eduardo Hayward, about the whole situation. We discussed endoscopic evaluation. The family does not wish endoscopic evaluationat this time. Eduardo apparently has been anemic in the past require transfusions and their preference would be to simply transfuse him and see what happens. I did explain the relative risk of EGD and colonoscopy. The familydoes not want to subject himto any diagnostic studies unless absolutely need. 2. Multiple medical problems as noted above including significant dementia  Plan: 1. I will go ahead in advance his diet. 2. We will be available if Eduardo continues to bleed heavily or the family changes their mind about diagnostic evaluation. We will sign off but please call us if needed.   Yasmen Cortner JR,Onna Nodal L 10/29/2016, 11:36 AM   This  note was created using voice recognition software and minor errors may Have occurred unintentionally. Pager: [phone] If no answer or after hours call [phone]

## 2016-10-29 NOTE — Telephone Encounter (Signed)
Pts son called stating pt went to the cardiologist and they sent him over to the hospital they kept the pt and gave pt a transfusion.  At the time of admission to the hospital hemoglobin levels was 6.9 10/27/16.  Drs at the hospital state that the pt hemoglobin need to be watched very closely.  Pts son made an appointment for next Friday.

## 2016-10-29 NOTE — Consult Note (Signed)
Consultation Note Date: 10/29/2016   Patient Name: [name]  DOB: 07-01-1927  MRN: [id number]  Age / Sex: 83 y.o., male  PCP: [name], NP Referring Physician: Barton Dubois, MD  Reason for Consultation: Establishing goals of care  HPI/Patient Profile: 83 y.o. male  with past medical history of dementia, bladder CA, Diabetes, recurrent GI bleeds, systolic heart failure (EF 45-55%) who was admitted on 10/28/2016 with anemia. On admission his Hgb was  6.9 g/dL. After two units of blood his Hgb has increased to 8.1. About three weeks ago he had his Hgb check by his PCP and it was 10.1.   Clinical Assessment and Goals of Care:  I have reviewed medical records including EPIC notes, labs and imaging and assessed the patient and then spoke with [name], the patient's son, as well as his wife on the phone to discuss diagnosis prognosis,and disposition. The patient son reports that this is a recurrent problem for the patient. He has had anemia flare ups for the past 10 years, requiring  about 4-5 transfusions total. It has been about three years since his last episode.  In the past he has received a blood transfusion and his hbg has remained stable for a long period before dropping again.   The family is hopeful that this will be the case this time as well.  [name] describes an episode of BRBPR overnight about 3 weeks ago, but none since.  I introduced Palliative Medicine as specialized medical care for people living with serious illness. It focuses on providing relief from the symptoms and stress of a serious illness. The goal is to improve quality of life for both the patient and the family.  We discussed the patient's life at home. The patient lives with his wife and he has help day and night to get around the house and cook meals.  His son reports that he sits and sleeps in his chair frequently at night.  As far as  functional status, the patient's son reports that he can get around at home with the help of the walker. He states that he needs more help when he gets weak like he currently is because his balance becomes unsteady. In regards to nutritional status the patient's son reports that he will eat anything you put in front of him he has a great appetite.   The patient's son would like for him to return home and stay at home as long as he can. If something were to happen that he can no longer stay at home that is the time to transition him to a facility.   Advanced directives, concepts specific to code status and rehospitalization were considered and discussed. The patient's wife is his surrogate Media planner.  Her 3 adult children help her make decisions.   The patient is currently a DNR.  The family is comfortable with him returning to the hospital if needed.  They are also comfortable with him receiving colonoscopy / endoscopy if his bleeding continues.  Hospice and  Palliative Care services outpatient were explained and offered.  [name] and [name] would like for [name] to be followed by Palliative Care outpatient.  Questions and concerns were addressed. The family was encouraged to call with questions or concerns.    Primary Decision Maker:  NEXT OF KIN  Wife and [name]    SUMMARY OF RECOMMENDATIONS    Palliative Care outpatient follow up.  Family requests [facility]  Code Status/Advance Care Planning:  DNR      Symptom Management:   Continue per primary team  Will add delirium precautions.  Additional Recommendations (Limitations, Scope, Preferences):  Full Scope Treatment   Prognosis: unable to determine, but likely less than 1 year given progressive dementia, HF, recurrent bleeding and advanced age    Discharge Planning: Home with Palliative Services      Primary Diagnoses: Present on Admission: . GI bleed . Anemia, iron deficiency . Atrial fibrillation ([place]) . Dementia  with behavioral disturbance . Hypertension   I have reviewed the medical record, interviewed the patient and family, and examined the patient. The following aspects are pertinent.  Past Medical History:  Diagnosis Date  . Anemia    iron deficiency secondary to bleeding  . Arrhythmia    a fib  . Arthritis    "all over" (10/28/2016)  . Atrial fibrillation ([place])   . Bladder cancer (HCC)    BCG [name]  . CHF (congestive heart failure) ([place])   . Dementia    "he's had it for a number of years" (10/28/2016)  . ED (erectile dysfunction)   . Genital warts    recurrent. Not a problem  . GERD (gastroesophageal reflux disease)    treated with PPI  . Hard of hearing   . Hematochezia    has intermittent problem  . History of blood transfusion    "related to low HgB" (10/28/2016)  . History of hiatal hernia    "years ago" (10/28/2016)  . Hypertension    mild  . Irritable bladder   . Memory disorder   . Neuropathy    burning discomfort feet/legs  . On home oxygen therapy    "2L at night" (10/28/2016)  . Pneumonia    "twice" (10/28/2016)  . Restless leg    treats with heat  . Type II diabetes mellitus (HCC)    NIDDM on oral medication   Social History   Social History  . Marital status: Married    Spouse name: [name]  . Number of children: 4  . Years of education: 10th    Occupational History  . retired Retired   Social History Main Topics  . Smoking status: Former Smoker    Packs/day: 2.00    Years: 40.00    Types: Cigarettes    Quit date: 08/28/1975  . Smokeless tobacco: Former Systems developer    Types: Chew    Quit date: 73  . Alcohol use No  . Drug use: No  . Sexual activity: Not Currently   Other Topics Concern  . None   Social History Narrative   Patient lives at home with his wife [name]) Married 24 years.   Retired   Education 10 th grade   Right handed   Caffeine one or two daily    Family History  Problem Relation Age of Onset  . Kidney disease Mother   .  Heart disease Mother   . Stroke Mother   . Cancer Father        prostate  .  Mental illness Brother   . Diabetes Neg Hx   . COPD Neg Hx   . Heart attack Neg Hx   . Hypertension Neg Hx    Scheduled Meds: . LORazepam  0.5 mg Intravenous Once  . metoprolol tartrate  2.5 mg Intravenous Q6H  . mirabegron ER  50 mg Oral Daily  . pantoprazole (PROTONIX) IV  40 mg Intravenous Q12H   Continuous Infusions: . sodium chloride 10 mL/hr at 10/29/16 0358   PRN Meds:.LORazepam, nitroGLYCERIN, prochlorperazine, QUEtiapine Allergies  Allergen Reactions  . Cephalexin Other (See Comments)    Unknown reaction  . Furacin [Nitrofurazone] Other (See Comments)    Unknown reaction  . Macrodantin Other (See Comments)    Unknown reaction  . Zestril [Lisinopril] Other (See Comments)    Unknown reaction   Review of Systems patient pleasantly demented  Physical Exam  Elderly pleasantly demented man.  Lying in bed getting ready to have a bath CV rrr Resp no distress Abdomen soft nt, nd  Vital Signs: BP 131/70   Pulse 98   Temp 98.4 F (36.9 C) (Oral)   Resp 20   Ht 6\' 3"  (1.905 m)   SpO2 91%  Pain Assessment: No/denies pain     SpO2: SpO2: 91 % O2 Device:SpO2: 91 % O2 Flow Rate: .O2 Flow Rate (L/min): 2 L/min  IO: Intake/output summary:  Intake/Output Summary (Last 24 hours) at 10/29/16 1454 Last data filed at 10/29/16 0945  Gross per 24 hour  Intake              650 ml  Output              300 ml  Net              350 ml    LBM: Last BM Date: 10/28/16 Baseline Weight:   Most recent weight:       Palliative Assessment/Data:     Time In: 2:40 Time Out: 4:00 Time Total: 80 min. Greater than 50%  of this time was spent counseling and coordinating care related to the above assessment and plan.  Signed by:  Blane Ohara, PA-S Florentina Jenny, PA-C Palliative Medicine Pager: [phone]  Please contact Palliative Medicine Team phone at [phone] for questions and  concerns.  For individual provider: See Shea Evans

## 2016-10-29 NOTE — Progress Notes (Signed)
NURSING PROGRESS NOTE  [name] MRN: [id number] Admission Data: 10/28/16 at 2050 Attending Provider: Reubin Milan, MD PCP: [name], NP Code status: DNR  Allergies:  Allergies  Allergen Reactions  . Cephalexin Other (See Comments)    Unknown reaction  . Furacin [Nitrofurazone] Other (See Comments)    Unknown reaction  . Macrodantin Other (See Comments)    Unknown reaction  . Zestril [Lisinopril] Other (See Comments)    Unknown reaction   Past Medical History:  Past Medical History:  Diagnosis Date  . Anemia    iron deficiency secondary to bleeding  . Arrhythmia    a fib  . Arthritis    "all over" (10/28/2016)  . Atrial fibrillation ([place])   . Bladder cancer (HCC)    BCG [name]  . CHF (congestive heart failure) ([place])   . Dementia    "he's had it for a number of years" (10/28/2016)  . ED (erectile dysfunction)   . Genital warts    recurrent. Not a problem  . GERD (gastroesophageal reflux disease)    treated with PPI  . Hard of hearing   . Hematochezia    has intermittent problem  . History of blood transfusion    "related to low HgB" (10/28/2016)  . History of hiatal hernia    "years ago" (10/28/2016)  . Hypertension    mild  . Irritable bladder   . Memory disorder   . Neuropathy    burning discomfort feet/legs  . On home oxygen therapy    "2L at night" (10/28/2016)  . Pneumonia    "twice" (10/28/2016)  . Restless leg    treats with heat  . Type II diabetes mellitus (HCC)    NIDDM on oral medication   Past Surgical History:  Past Surgical History:  Procedure Laterality Date  . APPENDECTOMY  1946  . CARDIAC CATHETERIZATION N/A 01/13/2015   Procedure: Pericardiocentesis;  Surgeon: Leonie Man, MD;  Location: [facility];  Service: Cardiovascular;  Laterality: N/A;  . CARDIOVASCULAR STRESS TEST  03/28/2003   EF 55%.  Normal cardiolite study  . JOINT REPLACEMENT  1990's   left TKR  . KNEE ARTHROSCOPY Right 1980's  .  TONSILLECTOMY  1938   [name] is a 83 y.o. male patient, arrived to floor in room 5W07 via stretcher, transferred from ED. Patient alert and oriented X 2-3. No acute distress noted. Denies pain.   Vital signs: Oral temperature 98.4 F (36.9 C), Blood pressure 115/53, Pulse 68, RR 17, SpO2 94 % on room air. Height 6'3"   Cardiac monitoring: Telemetry box 5W # 14 in place. Second verified by Mikal Plane RN  IV access: Right Forearm-saline locked on arrival from ED; condition patent and no redness.  Skin: intact, no pressure ulcer noted in sacral area. Abrasion noted on great toe of right foot; MASD noted on bottom located between both buttocks; Ecchymosis noted on bilateral upper extremities. Second verified by Stefan Church., RN  Patient's ID armband verified with patient/ family, and in place. Information packet given to patient/ family. Fall risk assessed, SR up X2, patient/ family able to verbalize understanding of risks associated with falls and to call nurse or staff to assist before getting out of bed. Patient/ family oriented to room and equipment. Call bell within reach.

## 2016-10-29 NOTE — Consult Note (Signed)
[facility] CM Primary Care Navigator  10/29/2016  [name] February 01, 1928 [id number]   Met with patient at the bedside but he was unable to answer questions accurately. RN reports that patient's son had stepped off the unit. Called and spoke with wife [name]) to identify possible discharge needs.  Wife reports that patient was admitted due to low hemoglobin level needing transfusion. Patient's wife endorses Beaulah Dinning, NP with [employer] [facility] at [place] as his primary care provider.   Patient's wife shared using [employer] [facility] [employer] obtain medications without difficulty.   Patient's son [name]) is managing his medications at home using "pill box" system filled every 2 weeks.  Wife reports that son provides transportation topatient's doctors'appointments.  Patient's wife is the primary caregiver at home but has hired 24-hour caregivers from [employer] to assist with patient's care.  Anticipated discharge plan is home according wife.  Wife voiced understanding to callprimary care provider's officewhen patient gets home,to schedule a post discharge follow-upwithin a week or sooner if needed.Wife is aware that patient letter (with PCP's contact number) was provided at the bedside as their reminder.  Explained to wife regarding [facility] CM services available for health management but states that patient is being managed well at home and denies any current needs or concerns at this time.  Wife declined EMMI Calls offeredforfollow-up,stating that he will be cared for by caregivers when he gets home.    Wife states understandingto seek a referral from primary care provider to [facility] care management if deemed necessaryfor services in the future.  [facility] care management information provided for future needs that may arise.  For questions, please contact:  Dannielle Huh, BSN, RN- [facility] Primary Care Navigator  Telephone: [phone] [place]

## 2016-10-29 NOTE — Progress Notes (Signed)
Received report from Crystal,RN in the ED. 

## 2016-10-29 NOTE — Telephone Encounter (Signed)
PA approved, form faxed back to pharmacy. 

## 2016-10-29 NOTE — Telephone Encounter (Signed)
Received PA request for Myrbetriq. PA submitted & pending. Key: [id number]

## 2016-10-30 DIAGNOSIS — K921 Melena: Principal | ICD-10-CM

## 2016-10-30 LAB — GLUCOSE, CAPILLARY
GLUCOSE-CAPILLARY: 96 mg/dL (ref 65–99)
Glucose-Capillary: 76 mg/dL (ref 65–99)
Glucose-Capillary: 97 mg/dL (ref 65–99)

## 2016-10-30 LAB — CBC
HCT: 25.6 % — ABNORMAL LOW (ref 39.0–52.0)
HEMOGLOBIN: 7.7 g/dL — AB (ref 13.0–17.0)
MCH: 26.7 pg (ref 26.0–34.0)
MCHC: 30.1 g/dL (ref 30.0–36.0)
MCV: 88.9 fL (ref 78.0–100.0)
Platelets: 126 10*3/uL — ABNORMAL LOW (ref 150–400)
RBC: 2.88 MIL/uL — ABNORMAL LOW (ref 4.22–5.81)
RDW: 16.9 % — ABNORMAL HIGH (ref 11.5–15.5)
WBC: 3.4 10*3/uL — ABNORMAL LOW (ref 4.0–10.5)

## 2016-10-30 LAB — PREPARE RBC (CROSSMATCH)

## 2016-10-30 MED ORDER — POLYSACCHARIDE IRON COMPLEX 150 MG PO CAPS: 150.0000 mg | ORAL_CAPSULE | Freq: Two times a day (BID) | ORAL | 1 refills | Status: DC

## 2016-10-30 MED ORDER — FUROSEMIDE 40 MG PO TABS: ORAL_TABLET | ORAL | Status: DC

## 2016-10-30 MED ORDER — PANTOPRAZOLE SODIUM 40 MG PO TBEC: 40.0000 mg | DELAYED_RELEASE_TABLET | Freq: Two times a day (BID) | ORAL | 1 refills | Status: DC

## 2016-10-30 MED ORDER — SODIUM CHLORIDE 0.9 % IV SOLN
Freq: Once | INTRAVENOUS | Status: AC
  Administered 2016-10-30: 10:00:00 via INTRAVENOUS

## 2016-10-30 NOTE — Care Management Note (Addendum)
Case Management Note  Patient Details  Name: [name] MRN: [id number] Date of Birth: [birth year]  Subjective/Objective:  83 y.o. M to be discharged home with Palliative Care at discharge and Wife has chosen [facility] when the time comes. CM spoke with Clerance Lav, on call rep today who will accept referral at actual time of discharge. Will assess for DME needs.   10:45: Spoke to Chillum, son at bedside, who has a specific plan for care of his father which does not include Palliative services at this time. He is receiving Blood transfusions and aggressive treatments of his Chronic Anemia with supportive Care provided by PCP, Marjory Sneddon, MD and [facility]. No HH or DME required.                  Action/Plan:CM will sign off for now but will be available should additional discharge needs arise or disposition change.   Expected Discharge Date:                  Expected Discharge Plan:     In-House Referral:     Discharge planning Services  CM Consult  Post Acute Care Choice:    Choice offered to:     DME Arranged:    DME Agency:     HH Arranged:    HH Agency:     Status of Service:  In process, will continue to follow  If discussed at Long Length of Stay Meetings, dates discussed:    Additional Comments:  Delrae Sawyers, RN 10/30/2016, 9:05 AM

## 2016-10-30 NOTE — Discharge Summary (Signed)
Physician Discharge Summary  [name] FGH:[id number] DOB: [birth year] DOA: 10/28/2016  PCP: [name], NP  Admit date: 10/28/2016 Discharge date: 10/30/2016  Time spent: 35 minutes  Recommendations for Outpatient Follow-up:  1. Repeat BMET to follow electrolytes and renal function  2. Repeat CBC to follow Hgb trend 3. Patient will benefit of early follow up visits; in order to orchestrate outpatient transfusion if require.   Discharge Diagnoses:  Principal Problem:   GI bleed Active Problems:   Atrial fibrillation (HCC)   Diabetes mellitus (HCC)   Anemia, iron deficiency   Dementia with behavioral disturbance   Hypertension   Melena   Symptomatic anemia   Palliative care encounter   Discharge Condition: stable. Patient discharge home with instructions to follow up with PCP in 1 week.  Diet recommendation: watch sodium intake, heart healthy diet   History of present illness:  83 y.o.malewith medical history significant of anemia, history of A. fib, osteoarthritis, history of bladder cancer, dementia, chronic systolic heart failure, GERD who comes to the emergency department with complaints of progressively worse weakness for the past few weeks. He was seen yesterday by his geriatric nurse practitioner, who will order blood work and was found to have a hemoglobin level of 6.9, down from 10.0 g/dL on 10/06/2016.  Hospital Course:  1-symptomatic anemia: acute on chronic iron deficiency from GIB. -after discussing with son, patient had hx of recurrent GIB and has required transfusion in the past in multiple occasion. -for now will continue conservative approach and will hold on invasive work up -patient had a total of 3 units given during this admission  -no acute bleeding appreciated -continue holding ASA (for at least 1 week) -discharge on Protonix twice a day  -after discussing with GI; will switch ferrous sulfate for niferex -close follow up of CBC after discharge  recommended.  2-atrial fibrillation: chronic  -CHADsVASC score 4-5 -Not a candidate for anticoagulation  -patient chronically on A. Fib, rate controlled -will resume lopressor  -continue holding ASA for now  3-HTN -soft on admission, but rising and stable at discharge -will resume home metoprolol dose  4-dementia with behavioral disorder  -condition is advanced, patient oriented only to person. -will continue lorazepam and seroquel -continue supportive care -continue namenda/aricept  5-chronic systolic HF: compensated  -last EF 45-50% from June 2018 -will continue metoprolol -continue home lasix as instructed by cardiology service -educated about sodium intake intake and importance of checking his weight   Procedures:  None   Consultations:  GI  Discharge Exam: Vitals:   10/29/16 2200 10/30/16 0453  BP: (!) 118/45 (!) 100/44  Pulse: 63 67  Resp: 18 16  Temp: 98.1 F (36.7 C) 98.2 F (36.8 C)  SpO2: 98% 98%    General: afebrile, no CP, no SOB. Patient is pleasantly confused and oriented X 1 only. No signs of overt bleeding appreciated or reported.  Cardiovascular: irregular, rate controlled, no rubs, no gallops, positive SEM  Respiratory: no using accessory muscles, good air movement, no wheezing   Abdomen: soft, NT, ND, positive BS  Musculoskeletal: trace edema bilaterally, no cyanosis    Discharge Instructions   Discharge Instructions    (HEART FAILURE PATIENTS) Call MD:  Anytime you have any of the following symptoms: 1) 3 pound weight gain in 24 hours or 5 pounds in 1 week 2) shortness of breath, with or without a dry hacking cough 3) swelling in the hands, feet or stomach 4) if you have to sleep on extra pillows  at night in order to breathe.    Complete by:  As directed    Diet - low sodium heart healthy    Complete by:  As directed    Discharge instructions    Complete by:  As directed    Maintain adequate hydration Please watch sodium  intake and check weight on daily basis Take medications as prescribed  Follow up with PCP in 1 week (if possible make appointment in am, in case that transfusion is needed can be arranged in the outpatient setting) Stop Aspirin until follow up with PCP Noticed that your iron pills has been substituted for niferex (hoping for better iron absorption)     Current Discharge Medication List    START taking these medications   Details  iron polysaccharides (NIFEREX) 150 MG capsule Take 1 capsule (150 mg total) by mouth 2 (two) times daily. Qty: 60 capsule, Refills: 1    pantoprazole (PROTONIX) 40 MG tablet Take 1 tablet (40 mg total) by mouth 2 (two) times daily. Qty: 60 tablet, Refills: 1      CONTINUE these medications which have CHANGED   Details  furosemide (LASIX) 40 MG tablet Take 2 tablets (80 mg) by mouth in the morning ever day, if patient gain > 3 pounds overnight and/or > 5 pounds in a week can take extra 40mg  in the afternoon      CONTINUE these medications which have NOT CHANGED   Details  folic acid (FOLVITE) 1 MG tablet Take 1 mg by mouth 2 (two) times daily.     hydrOXYzine (ATARAX/VISTARIL) 10 MG tablet Take 10 mg by mouth 2 (two) times daily.    LORazepam (ATIVAN) 0.5 MG tablet TAKE 1 TABLET TWICE DAILY AS NEEDED FOR ANXIETY. Qty: 60 tablet, Refills: 0   Associated Diagnoses: Alzheimer's dementia with behavioral disturbance, unspecified timing of dementia onset    Memantine HCl-Donepezil HCl (NAMZARIC) 28-10 MG CP24 Take 1 tablet by mouth every morning. Qty: 30 capsule, Refills: 11    metoprolol tartrate (LOPRESSOR) 25 MG tablet Take 0.5 tablets (12.5 mg total) by mouth 2 (two) times daily. Qty: 90 tablet, Refills: 3    mirabegron ER (MYRBETRIQ) 50 MG TB24 tablet Take 1 tablet (50 mg total) by mouth daily. Qty: 60 tablet, Refills: 5    Multiple Vitamin (MULTIVITAMIN WITH MINERALS) TABS tablet Take 1 tablet by mouth daily.    Multiple Vitamins-Minerals  (PRESERVISION AREDS 2 PO) Take 1 tablet by mouth 2 (two) times daily.     nitroGLYCERIN (NITROSTAT) 0.4 MG SL tablet Place 1 tablet (0.4 mg total) under the tongue every 5 (five) minutes as needed for chest pain (x 3 doses daily). Qty: 30 tablet, Refills: 3    QUEtiapine (SEROQUEL) 25 MG tablet Take 1 tablet (25 mg total) by mouth at bedtime as needed. Qty: 30 tablet, Refills: 6      STOP taking these medications     aspirin 81 MG tablet      ferrous sulfate 325 (65 FE) MG tablet        Allergies  Allergen Reactions  . Cephalexin Other (See Comments)    Unknown reaction  . Furacin [Nitrofurazone] Other (See Comments)    Unknown reaction  . Macrodantin Other (See Comments)    Unknown reaction  . Zestril [Lisinopril] Other (See Comments)    Unknown reaction   Follow-up Information    [name], NP. Schedule an appointment as soon as possible for a visit in 1 week(s).   Specialty:  Family Medicine Contact information: [address] [place] [address] [phone]           The results of significant diagnostics from this hospitalization (including imaging, microbiology, ancillary and laboratory) are listed below for reference.    Significant Diagnostic Studies: Dg Chest Portable 1 View  Result Date: 10/28/2016 CLINICAL DATA:  Weakness. EXAM: PORTABLE CHEST 1 VIEW COMPARISON:  07/29/2016 and prior radiographs FINDINGS: Cardiomegaly, left pleural effusion and left basilar opacity again noted. There is no evidence of pneumothorax. No interval changes are present. No acute bony abnormalities are identified. IMPRESSION: No significant change from the prior study with continued left pleural effusion and left basilar opacity/atelectasis. Electronically Signed   By: Margarette Canada M.D.   On: 10/28/2016 16:46    Microbiology: No results found for this or any previous visit (from the past 240 hour(s)).   Labs: Basic Metabolic Panel:  Recent Labs Lab  10/27/16 1438 10/28/16 1527 10/29/16 0750  NA 144 140 144  K 4.0 3.5 3.5  CL 106 106 111  CO2 26 27 26   GLUCOSE 109* 102* 91  BUN 22 21* 16  CREATININE 0.85 0.98 0.87  CALCIUM 8.4* 8.6* 8.4*  MG  --  2.1  --    Liver Function Tests:  Recent Labs Lab 10/28/16 1527  AST 19  ALT 15*  ALKPHOS 93  BILITOT 0.5  PROT 5.8*  ALBUMIN 3.2*   CBC:  Recent Labs Lab 10/27/16 1438 10/28/16 1527 10/28/16 2121 10/29/16 0750 10/30/16 0441  WBC 3.4 4.2  --  3.5* 3.4*  HGB 6.9* 7.1* 6.7* 8.1* 7.7*  HCT 22.6* 24.1*  --  26.8* 25.6*  MCV 90 90.6  --  89.3 88.9  PLT 178 180  --  145* 126*   ProBNP (last 3 results)  Recent Labs  04/26/16 1519 08/25/16 1510  PROBNP 1,905* 1,266*    CBG:  Recent Labs Lab 10/29/16 0708 10/29/16 1239 10/29/16 1725 10/30/16 0029 10/30/16 0530  GLUCAP 89 83 98 97 76    Signed:  Barton Dubois MD.  [facility] 10/30/2016, 9:23 AM

## 2016-10-30 NOTE — Progress Notes (Signed)
PT D/C'D PER MD ORDERS. PT WAS STABLE LEAVING THE FLOOR. IV WAS REMOVED. PT LEFT WITH SCRIPTS AND AVS. PTS SON CHRISTOPHER CAME AND PICKED HIM UP. LEFT THE FLOOR VIA WHEELCHAIR TO PRIVATE VEHICLE.

## 2016-10-31 LAB — TYPE AND SCREEN
ABO/RH(D): A POS
ANTIBODY SCREEN: NEGATIVE
UNIT DIVISION: 0
UNIT DIVISION: 0
Unit division: 0

## 2016-10-31 LAB — BPAM RBC
Blood Product Expiration Date: 201808142359
Blood Product Expiration Date: 201808292359
Blood Product Expiration Date: 201808292359
ISSUE DATE / TIME: 201808092336
ISSUE DATE / TIME: 201808100340
ISSUE DATE / TIME: 201808111009
UNIT TYPE AND RH: 6200
Unit Type and Rh: 6200
Unit Type and Rh: 6200

## 2016-11-02 ENCOUNTER — Ambulatory Visit (INDEPENDENT_AMBULATORY_CARE_PROVIDER_SITE_OTHER): Payer: Medicare Other | Admitting: Adult Health

## 2016-11-02 ENCOUNTER — Encounter: Payer: Self-pay | Admitting: Adult Health

## 2016-11-02 ENCOUNTER — Other Ambulatory Visit: Payer: Self-pay | Admitting: Adult Health

## 2016-11-02 VITALS — BP 108/56 | Temp 97.8°F | Wt 186.0 lb

## 2016-11-02 DIAGNOSIS — D649 Anemia, unspecified: Secondary | ICD-10-CM

## 2016-11-02 DIAGNOSIS — F0390 Unspecified dementia without behavioral disturbance: Secondary | ICD-10-CM

## 2016-11-02 LAB — BASIC METABOLIC PANEL
BUN: 13 mg/dL (ref 6–23)
CHLORIDE: 103 meq/L (ref 96–112)
CO2: 32 mEq/L (ref 19–32)
CREATININE: 0.9 mg/dL (ref 0.40–1.50)
Calcium: 8.3 mg/dL — ABNORMAL LOW (ref 8.4–10.5)
GFR: 84.73 mL/min (ref >60.00)
GLUCOSE: 100 mg/dL — AB (ref 70–99)
POTASSIUM: 3.8 meq/L (ref 3.5–5.1)
Sodium: 139 mEq/L (ref 135–145)

## 2016-11-02 LAB — CBC WITH DIFFERENTIAL/PLATELET
BASOS ABS: 0 10*3/uL (ref 0.0–0.1)
BASOS PCT: 0.9 % (ref 0.0–3.0)
EOS ABS: 0.1 10*3/uL (ref 0.0–0.7)
Eosinophils Relative: 3.3 % (ref 0.0–5.0)
HEMATOCRIT: 29.8 % — AB (ref 39.0–52.0)
HEMOGLOBIN: 9.2 g/dL — AB (ref 13.0–17.0)
LYMPHS PCT: 11.2 % — AB (ref 12.0–46.0)
Lymphs Abs: 0.4 10*3/uL — ABNORMAL LOW (ref 0.7–4.0)
MCHC: 30.9 g/dL (ref 30.0–36.0)
MCV: 89.3 fl (ref 78.0–100.0)
MONOS PCT: 13.6 % — AB (ref 3.0–12.0)
Monocytes Absolute: 0.4 10*3/uL (ref 0.1–1.0)
Neutro Abs: 2.3 10*3/uL (ref 1.4–7.7)
Neutrophils Relative %: 71 % (ref 43.0–77.0)
Platelets: 131 10*3/uL — ABNORMAL LOW (ref 150.0–400.0)
RBC: 3.34 Mil/uL — ABNORMAL LOW (ref 4.22–5.81)
RDW: 16.1 % — ABNORMAL HIGH (ref 11.5–15.5)
WBC: 3.2 10*3/uL — AB (ref 4.0–10.5)

## 2016-11-02 NOTE — Progress Notes (Signed)
Subjective:    Patient ID: [name], male    DOB: [birth year], 83 y.o.   MRN: [id number]  HPI  83 year old male who  has a past medical history of Anemia; Arrhythmia; Arthritis; Atrial fibrillation ([place]); Bladder cancer [facility]); CHF (congestive heart failure) ([place]); Dementia; ED (erectile dysfunction); Genital warts; GERD (gastroesophageal reflux disease); Hard of hearing; Hematochezia; History of blood transfusion; History of hiatal hernia; Hypertension; Irritable bladder; Memory disorder; Neuropathy; On home oxygen therapy; Pneumonia; Restless leg; and Type II diabetes mellitus ([place]). He presents to the office today for hospital follow up. He was admitted on 10/28/2016 and discharged on 10/30/2016.   He was seen by Truitt Merle, NP with Cardiology on 10/27/2016 and was found to have a Hemoglobin of 6.9, down from 10 on 10/06/2016. He was sent to the ER, where he was admitted for symptomatic anemia; acute on chronic iron deficiency from GIB. He was given three units of PRBC during his admission. He was discharged on Protonic BID and ferrous sulfate for niferex.   Today in the office his son [name]) reports that [name] has been more lively and has been talking more at home. He does not appears as weak as prior to admission. His appetite has improved   Family reports that they had been confused on the lasix orders before being admitted to the hospital and had not been giving Buck his lasix. They are on the regular scheduled now. He has lost significant water weight   Wt Readings from Last 3 Encounters:  11/02/16 186 lb (84.4 kg)  10/27/16 215 lb 6.4 oz (97.7 kg)  10/15/16 184 lb (83.5 kg)     Review of Systems  Unable to perform ROS: Dementia   Past Medical History:  Diagnosis Date  . Anemia    iron deficiency secondary to bleeding  . Arrhythmia    a fib  . Arthritis    "all over" (10/28/2016)  . Atrial fibrillation ([place])   . Bladder cancer (HCC)    [facility] [name]  . CHF  (congestive heart failure) ([place])   . Dementia    "he's had it for a number of years" (10/28/2016)  . ED (erectile dysfunction)   . Genital warts    recurrent. Not a problem  . GERD (gastroesophageal reflux disease)    treated with PPI  . Hard of hearing   . Hematochezia    has intermittent problem  . History of blood transfusion    "related to low HgB" (10/28/2016)  . History of hiatal hernia    "years ago" (10/28/2016)  . Hypertension    mild  . Irritable bladder   . Memory disorder   . Neuropathy    burning discomfort feet/legs  . On home oxygen therapy    "2L at night" (10/28/2016)  . Pneumonia    "twice" (10/28/2016)  . Restless leg    treats with heat  . Type II diabetes mellitus (HCC)    NIDDM on oral medication    Social History   Social History  . Marital status: Married    Spouse name: [name]  . Number of children: 4  . Years of education: 10th    Occupational History  . retired Retired   Social History Main Topics  . Smoking status: Former Smoker    Packs/day: 2.00    Years: 40.00    Types: Cigarettes    Quit date: 08/28/1975  . Smokeless tobacco: Former Systems developer  Types: [name]    Quit date: 84  . Alcohol use No  . Drug use: No  . Sexual activity: Not Currently   Other Topics Concern  . Not on file   Social History Narrative   Patient lives at home with his wife [name]) Married 54 years.   Retired   Education 10 th grade   Right handed   Caffeine one or two daily     Past Surgical History:  Procedure Laterality Date  . APPENDECTOMY  1946  . CARDIAC CATHETERIZATION N/A 01/13/2015   Procedure: Pericardiocentesis;  Surgeon: Leonie Man, MD;  Location: [facility];  Service: Cardiovascular;  Laterality: N/A;  . CARDIOVASCULAR STRESS TEST  03/28/2003   EF 55%.  Normal cardiolite study  . JOINT REPLACEMENT  1990's   left TKR  . KNEE ARTHROSCOPY Right 1980's  . TONSILLECTOMY  1938    Family History  Problem Relation Age of Onset  . Kidney  disease Mother   . Heart disease Mother   . Stroke Mother   . Cancer Father        prostate  . Mental illness Brother   . Diabetes Neg Hx   . COPD Neg Hx   . Heart attack Neg Hx   . Hypertension Neg Hx     Allergies  Allergen Reactions  . Cephalexin Other (See Comments)    Unknown reaction  . Furacin [Nitrofurazone] Other (See Comments)    Unknown reaction  . Macrodantin Other (See Comments)    Unknown reaction  . Zestril [Lisinopril] Other (See Comments)    Unknown reaction    Current Outpatient Prescriptions on File Prior to Visit  Medication Sig Dispense Refill  . folic acid (FOLVITE) 1 MG tablet Take 1 mg by mouth 2 (two) times daily.     . furosemide (LASIX) 40 MG tablet Take 2 tablets (80 mg) by mouth in the morning ever day, if patient gain > 3 pounds overnight and/or > 5 pounds in a week can take extra 40mg  in the afternoon    . hydrOXYzine (ATARAX/VISTARIL) 10 MG tablet Take 10 mg by mouth 2 (two) times daily.    . iron polysaccharides (NIFEREX) 150 MG capsule Take 1 capsule (150 mg total) by mouth 2 (two) times daily. 60 capsule 1  . LORazepam (ATIVAN) 0.5 MG tablet TAKE 1 TABLET TWICE DAILY AS NEEDED FOR ANXIETY. (Patient taking differently: Take 0.5 mg by mouth 2 (two) times daily. ) 60 tablet 0  . Memantine HCl-Donepezil HCl (NAMZARIC) 28-10 MG CP24 Take 1 tablet by mouth every morning. 30 capsule 11  . metoprolol tartrate (LOPRESSOR) 25 MG tablet Take 0.5 tablets (12.5 mg total) by mouth 2 (two) times daily. 90 tablet 3  . mirabegron ER (MYRBETRIQ) 50 MG TB24 tablet Take 1 tablet (50 mg total) by mouth daily. 60 tablet 5  . Multiple Vitamin (MULTIVITAMIN WITH MINERALS) TABS tablet Take 1 tablet by mouth daily.    . Multiple Vitamins-Minerals (PRESERVISION AREDS 2 PO) Take 1 tablet by mouth 2 (two) times daily.     . nitroGLYCERIN (NITROSTAT) 0.4 MG SL tablet Place 1 tablet (0.4 mg total) under the tongue every 5 (five) minutes as needed for chest pain (x 3 doses  daily). 30 tablet 3  . pantoprazole (PROTONIX) 40 MG tablet Take 1 tablet (40 mg total) by mouth 2 (two) times daily. 60 tablet 1  . QUEtiapine (SEROQUEL) 25 MG tablet Take 1 tablet (25 mg total) by mouth at  bedtime as needed. 30 tablet 6   No current facility-administered medications on file prior to visit.     BP (!) 108/56 (BP Location: Right Arm)   Temp 97.8 F (36.6 C) (Oral)   Wt 186 lb (84.4 kg)   BMI 23.25 kg/m       Objective:   Physical Exam  Constitutional: He is oriented to person, place, and time.  Cardiovascular: Normal rate, regular rhythm and intact distal pulses.  Exam reveals no gallop and no friction rub.   Murmur heard. Pulmonary/Chest: Effort normal and breath sounds normal. No respiratory distress. He has no wheezes. He has no rales. He exhibits no tenderness.  Neurological: He is alert and oriented to person, place, and time.  Skin: Skin is warm and dry. No rash noted. No erythema. No pallor.  Psychiatric: He has a normal mood and affect. His behavior is normal. Judgment and thought content normal.  Nursing note and vitals reviewed.     Assessment & Plan:  1. Symptomatic anemia - [name] looks well in the office today. He is very talkative and his color has returned. Will check CBC and BMP - Continue to hold ASA - Consider outpatient infusion if needed - CBC with Differential/Platelet - Basic Metabolic Panel   [name], NP

## 2016-11-03 ENCOUNTER — Telehealth: Payer: Self-pay | Admitting: Nurse Practitioner

## 2016-11-03 NOTE — Telephone Encounter (Signed)
Noted  

## 2016-11-03 NOTE — Telephone Encounter (Signed)
New Message    Per son : this is an update !  Pt had 3 units of blood, came home from hospital Saturday , he is back on track weight wise with Lasix   Thanks for you exceptional care

## 2016-11-05 ENCOUNTER — Ambulatory Visit: Payer: Medicare Other | Admitting: Adult Health

## 2016-11-09 ENCOUNTER — Telehealth: Payer: Self-pay | Admitting: Adult Health

## 2016-11-09 NOTE — Telephone Encounter (Signed)
Cory need order for BMP.  CBC has been ordered.

## 2016-11-09 NOTE — Telephone Encounter (Signed)
Please schedule

## 2016-11-09 NOTE — Telephone Encounter (Signed)
Just need a cbc for anemia

## 2016-11-09 NOTE — Telephone Encounter (Signed)
° ° ° °  Pt son call to say pt need to have his blood check  Hemoglobin and call to ask if a order can be put in for him to come in tomorrow    [place] [phone]

## 2016-11-11 ENCOUNTER — Other Ambulatory Visit (INDEPENDENT_AMBULATORY_CARE_PROVIDER_SITE_OTHER): Payer: Medicare Other

## 2016-11-11 DIAGNOSIS — D649 Anemia, unspecified: Secondary | ICD-10-CM

## 2016-11-11 LAB — CBC WITH DIFFERENTIAL/PLATELET
BASOS PCT: 0.9 % (ref 0.0–3.0)
Basophils Absolute: 0 10*3/uL (ref 0.0–0.1)
EOS ABS: 0.1 10*3/uL (ref 0.0–0.7)
Eosinophils Relative: 4.4 % (ref 0.0–5.0)
HEMATOCRIT: 30.4 % — AB (ref 39.0–52.0)
Hemoglobin: 9.5 g/dL — ABNORMAL LOW (ref 13.0–17.0)
LYMPHS ABS: 0.6 10*3/uL — AB (ref 0.7–4.0)
LYMPHS PCT: 20.3 % (ref 12.0–46.0)
MCHC: 31.1 g/dL (ref 30.0–36.0)
MCV: 86.3 fl (ref 78.0–100.0)
MONO ABS: 0.5 10*3/uL (ref 0.1–1.0)
Monocytes Relative: 15.7 % — ABNORMAL HIGH (ref 3.0–12.0)
NEUTROS ABS: 1.7 10*3/uL (ref 1.4–7.7)
NEUTROS PCT: 58.7 % (ref 43.0–77.0)
PLATELETS: 152 10*3/uL (ref 150.0–400.0)
RBC: 3.53 Mil/uL — ABNORMAL LOW (ref 4.22–5.81)
RDW: 16 % — AB (ref 11.5–15.5)
WBC: 2.9 10*3/uL — ABNORMAL LOW (ref 4.0–10.5)

## 2016-11-19 ENCOUNTER — Encounter: Payer: Self-pay | Admitting: *Deleted

## 2016-11-24 ENCOUNTER — Other Ambulatory Visit: Payer: Self-pay | Admitting: Adult Health

## 2016-11-24 DIAGNOSIS — F0281 Dementia in other diseases classified elsewhere with behavioral disturbance: Secondary | ICD-10-CM

## 2016-11-24 DIAGNOSIS — R5383 Other fatigue: Secondary | ICD-10-CM

## 2016-11-24 DIAGNOSIS — G309 Alzheimer's disease, unspecified: Principal | ICD-10-CM

## 2016-11-24 NOTE — Telephone Encounter (Signed)
Last filled on 10/28/16 #60 Last seen on 11/02/16 No future follow up scheduled. Please advise.  Thanks!!

## 2016-11-25 NOTE — Telephone Encounter (Signed)
Ok to refill for 30 days  

## 2016-11-25 NOTE — Telephone Encounter (Signed)
Called to the pharmacy and left on machine. 

## 2016-11-26 ENCOUNTER — Other Ambulatory Visit (INDEPENDENT_AMBULATORY_CARE_PROVIDER_SITE_OTHER): Payer: Medicare Other

## 2016-11-26 DIAGNOSIS — R5383 Other fatigue: Secondary | ICD-10-CM | POA: Diagnosis not present

## 2016-11-26 LAB — CBC WITH DIFFERENTIAL/PLATELET
BASOS ABS: 0 10*3/uL (ref 0.0–0.1)
Basophils Relative: 0.6 % (ref 0.0–3.0)
EOS ABS: 0.1 10*3/uL (ref 0.0–0.7)
Eosinophils Relative: 3.3 % (ref 0.0–5.0)
HEMATOCRIT: 32.7 % — AB (ref 39.0–52.0)
Hemoglobin: 10.2 g/dL — ABNORMAL LOW (ref 13.0–17.0)
LYMPHS ABS: 0.6 10*3/uL — AB (ref 0.7–4.0)
LYMPHS PCT: 14.9 % (ref 12.0–46.0)
MCHC: 31.1 g/dL (ref 30.0–36.0)
MCV: 83.7 fl (ref 78.0–100.0)
MONOS PCT: 14 % — AB (ref 3.0–12.0)
Monocytes Absolute: 0.5 10*3/uL (ref 0.1–1.0)
NEUTROS PCT: 67.2 % (ref 43.0–77.0)
Neutro Abs: 2.5 10*3/uL (ref 1.4–7.7)
PLATELETS: 153 10*3/uL (ref 150.0–400.0)
RBC: 3.91 Mil/uL — AB (ref 4.22–5.81)
RDW: 16.8 % — ABNORMAL HIGH (ref 11.5–15.5)
WBC: 3.7 10*3/uL — ABNORMAL LOW (ref 4.0–10.5)

## 2016-11-26 NOTE — Progress Notes (Unsigned)
[name]

## 2016-11-28 NOTE — Progress Notes (Deleted)
Follow-up Outpatient Visit Date: 11/29/2016  Primary Care Provider: [name], NP [address] [place] [address]  Chief Complaint: Weakness  HPI:  [name] is a 83 y.o. year-old male with history of heart failure with preserved ejection fraction, permanent atrial fibrillation not on anticoagulation secondary to recurrent GI bleeds, remote pleural and pericardial effusion status post pericardiocentesis, bladder cancer, diabetes mellitus, GERD, and dementia, who presents for follow-up of fatigue. I met him on 10/15/16, at which time his family and caregiver noted progressive fatigue. We decreased furosemide at that time out of concern for overdiuresis. At follow-up appointment with Truitt Merle, NP, on 8/8, increased edema was reported by his son. Furosemide was increased and family instructed to follow weights at home. Hemoglobin was also checked and was found to be down to 6.9, prompting admission and blood transfusion. Most recent hemoglobin on 9/7 was up to 10.2.  --------------------------------------------------------------------------------------------------  Cardiovascular History & Procedures: Cardiovascular Problems:  HFpEF  Permanent atrial fibrillation  Risk Factors:  Hypertension, diabetes mellitus, male gender, and age > 52  Cath/PCI:  LHC (08/06/01): LMCA normal. LAD with mild luminal irregularities. Dominant LCx with mild luminal irregularities involving OM branches. Small RCA with mild luminal irregularities. LVEF 60-65%. LVEDP 11 mmHg.  CV Surgery:  Pericardiocentesis (01/10/15)  EP Procedures and Devices:  None  Non-Invasive Evaluation(s):  TTE (08/20/16): Normal LV size with LVEF 45-50%. Mild AI. MAC with mild RM. Severe LA enlargement. Moderately dilated RV with mildly reduced RV contraction. Severe RA enlargement. Mild PH (RVSP 39 mmHg). Elevated CVP.  Pharmacologic MPI (01/23/17): No significant ischemia or scar. LV appears dilated. LVEF  not calculated.  Recent CV Pertinent Labs: Lab Results  Component Value Date   CHOL 125 03/20/2015   HDL 48 03/20/2015   LDLCALC 64 03/20/2015   TRIG 66 03/20/2015   CHOLHDL 2.6 03/20/2015   INR 1.46 01/12/2015   BNP 91.6 01/10/2015   K 3.8 11/02/2016   MG 2.1 10/28/2016   BUN 13 11/02/2016   BUN 22 10/27/2016   CREATININE 0.90 11/02/2016   CREATININE 0.74 03/20/2015    Past medical and surgical history were reviewed and updated in EPIC.  No outpatient prescriptions have been marked as taking for the 11/29/16 encounter (Appointment) with Damyah Gugel, Harrell Gave, MD.    Allergies: Cephalexin; Furacin [nitrofurazone]; Macrodantin; and Zestril [lisinopril]  Social History   Social History  . Marital status: Married    Spouse name: [name]  . Number of children: 4  . Years of education: 10th    Occupational History  . retired Retired   Social History Main Topics  . Smoking status: Former Smoker    Packs/day: 2.00    Years: 40.00    Types: Cigarettes    Quit date: 08/28/1975  . Smokeless tobacco: Former Systems developer    Types: Chew    Quit date: 92  . Alcohol use No  . Drug use: No  . Sexual activity: Not Currently   Other Topics Concern  . Not on file   Social History Narrative   Patient lives at home with his wife [name]) Married 53 years.   Retired   Education 10 th grade   Right handed   Caffeine one or two daily     Family History  Problem Relation Age of Onset  . Kidney disease Mother   . Heart disease Mother   . Stroke Mother   . Cancer Father        prostate  . Mental illness  Brother   . Diabetes Neg Hx   . COPD Neg Hx   . Heart attack Neg Hx   . Hypertension Neg Hx     Review of Systems: A 12-system review of systems was performed and was negative except as noted in the HPI.  --------------------------------------------------------------------------------------------------  Physical Exam: There were no vitals taken for this visit.  Initial BP  130/62; repeat BP 98/50  General:  *** HEENT: No conjunctival pallor or scleral icterus.  Moist mucous membranes.  OP clear. Neck: Supple without lymphadenopathy, thyromegaly, JVD, or HJR.  No carotid bruit. Lungs: Normal work of breathing.  Clear to auscultation bilaterally without wheezes or crackles. Heart: Regular rate and rhythm without murmurs, rubs, or gallops.  Non-displaced PMI. Abd: Bowel sounds present.  Soft, NT/ND without hepatosplenomegaly Ext: No lower extremity edema.  Radial, PT, and DP pulses are 2+ bilaterally. Skin: Warm and dry without rash.  EKG: ***  Lab Results  Component Value Date   WBC 3.7 (L) 11/26/2016   HGB 10.2 (L) 11/26/2016   HCT 32.7 (L) 11/26/2016   MCV 83.7 11/26/2016   PLT 153.0 11/26/2016    Lab Results  Component Value Date   NA 139 11/02/2016   K 3.8 11/02/2016   CL 103 11/02/2016   CO2 32 11/02/2016   BUN 13 11/02/2016   CREATININE 0.90 11/02/2016   GLUCOSE 100 (H) 11/02/2016   ALT 15 (L) 10/28/2016    Lab Results  Component Value Date   CHOL 125 03/20/2015   HDL 48 03/20/2015   LDLCALC 64 03/20/2015   TRIG 66 03/20/2015   CHOLHDL 2.6 03/20/2015    --------------------------------------------------------------------------------------------------  ASSESSMENT AND PLAN: HFpEF *** Chronic iron deficiency anemia ***  Permanent atrial fibrillation ***  Follow-up: ***  Nelva Bush, MD 11/28/2016 8:56 PM

## 2016-11-29 ENCOUNTER — Ambulatory Visit: Payer: Medicare Other | Admitting: Internal Medicine

## 2016-12-09 ENCOUNTER — Ambulatory Visit: Payer: Medicare Other | Admitting: Nurse Practitioner

## 2016-12-10 ENCOUNTER — Encounter: Payer: Self-pay | Admitting: Adult Health

## 2016-12-10 ENCOUNTER — Ambulatory Visit: Payer: Medicare Other | Admitting: Internal Medicine

## 2016-12-15 ENCOUNTER — Telehealth: Payer: Self-pay | Admitting: Adult Health

## 2016-12-15 ENCOUNTER — Ambulatory Visit (INDEPENDENT_AMBULATORY_CARE_PROVIDER_SITE_OTHER): Payer: Medicare Other | Admitting: Adult Health

## 2016-12-15 ENCOUNTER — Encounter: Payer: Self-pay | Admitting: Adult Health

## 2016-12-15 VITALS — BP 96/44 | HR 94 | Temp 97.5°F | Wt 180.0 lb

## 2016-12-15 DIAGNOSIS — R5383 Other fatigue: Secondary | ICD-10-CM | POA: Diagnosis not present

## 2016-12-15 NOTE — Telephone Encounter (Signed)
° °  FYI   Son said dad was not RX  amlodinde it waspantoprazole (PROTONIX) 40 MG tablet

## 2016-12-15 NOTE — Progress Notes (Signed)
Subjective:    Patient ID: [name], male    DOB: 16-May-1927, 83 y.o.   MRN: [id number]  HPI   He is a pleasant 83 year old male who  has a past medical history of Anemia; Arrhythmia; Arthritis; Atrial fibrillation ([place]); Bladder cancer [name]); CHF (congestive heart failure) ([place]); Dementia; ED (erectile dysfunction); Genital warts; GERD (gastroesophageal reflux disease); Hard of hearing; Hematochezia; History of blood transfusion; History of hiatal hernia; Hypertension; Irritable bladder; Memory disorder; Neuropathy; On home oxygen therapy; Pneumonia; Restless leg; and Type II diabetes mellitus ([place]).  He presents to the office today for the complaint of " fatigue" and impaired balance. This has been apparent for at least one week. Denies any fevers, n/v/d or rectal bleeding.   Family, who is with him today reports that the only medication changes that have happened was that [name] was started on Amlodipine in the hospital? I do not see this in the hospital notes or in the discharge instructions. Family is adamant that it was given and that [name] is taking this medication. His BP today in the office is 96/44   BP Readings from Last 3 Encounters:  12/15/16 (!) 96/44  11/02/16 (!) 108/56  10/30/16 (!) 111/46    Review of Systems See HPI   Past Medical History:  Diagnosis Date  . Anemia    iron deficiency secondary to bleeding  . Arrhythmia    a fib  . Arthritis    "all over" (10/28/2016)  . Atrial fibrillation ([place])   . Bladder cancer (H[facility])    [facility] [name]  . CHF (congestive heart failure) ([place])   . Dementia    "he's had it for a number of years" (10/28/2016)  . ED (erectile dysfunction)   . Genital warts    recurrent. Not a problem  . GERD (gastroesophageal reflux disease)    treated with PPI  . Hard of hearing   . Hematochezia    has intermittent problem  . History of blood transfusion    "related to low HgB" (10/28/2016)  . History of hiatal hernia    "years ago"  (10/28/2016)  . Hypertension    mild  . Irritable bladder   . Memory disorder   . Neuropathy    burning discomfort feet/legs  . On home oxygen therapy    "2L at night" (10/28/2016)  . Pneumonia    "twice" (10/28/2016)  . Restless leg    treats with heat  . Type II diabetes mellitus (HCC)    NIDDM on oral medication    Social History   Social History  . Marital status: Married    Spouse name: [name]  . Number of children: 4  . Years of education: 10th    Occupational History  . retired Retired   Social History Main Topics  . Smoking status: Former Smoker    Packs/day: 2.00    Years: 40.00    Types: Cigarettes    Quit date: 08/28/1975  . Smokeless tobacco: Former Systems developer    Types: Chew    Quit date: 43  . Alcohol use No  . Drug use: No  . Sexual activity: Not Currently   Other Topics Concern  . Not on file   Social History Narrative   Patient lives at home with his wife [name]) Married 60 years.   Retired   Education 10 th grade   Right handed   Caffeine one or two daily     Past Surgical  History:  Procedure Laterality Date  . APPENDECTOMY  1946  . CARDIAC CATHETERIZATION N/A 01/13/2015   Procedure: Pericardiocentesis;  Surgeon: Leonie Man, MD;  Location: [facility];  Service: Cardiovascular;  Laterality: N/A;  . CARDIOVASCULAR STRESS TEST  03/28/2003   EF 55%.  Normal cardiolite study  . JOINT REPLACEMENT  1990's   left TKR  . KNEE ARTHROSCOPY Right 1980's  . TONSILLECTOMY  1938    Family History  Problem Relation Age of Onset  . Kidney disease Mother   . Heart disease Mother   . Stroke Mother   . Cancer Father        prostate  . Mental illness Brother   . Diabetes Neg Hx   . COPD Neg Hx   . Heart attack Neg Hx   . Hypertension Neg Hx     Allergies  Allergen Reactions  . Cephalexin Other (See Comments)    Unknown reaction  . Furacin [Nitrofurazone] Other (See Comments)    Unknown reaction  . Macrodantin Other (See Comments)     Unknown reaction  . Zestril [Lisinopril] Other (See Comments)    Unknown reaction    Current Outpatient Prescriptions on File Prior to Visit  Medication Sig Dispense Refill  . folic acid (FOLVITE) 1 MG tablet Take 1 mg by mouth 2 (two) times daily.     . furosemide (LASIX) 40 MG tablet Take 2 tablets (80 mg) by mouth in the morning ever day, if patient gain > 3 pounds overnight and/or > 5 pounds in a week can take extra 40mg  in the afternoon    . hydrOXYzine (ATARAX/VISTARIL) 10 MG tablet Take 10 mg by mouth 2 (two) times daily.    . iron polysaccharides (NIFEREX) 150 MG capsule Take 1 capsule (150 mg total) by mouth 2 (two) times daily. 60 capsule 1  . LORazepam (ATIVAN) 0.5 MG tablet TAKE (1) TABLET TWICE A DAY AS NEEDED. 60 tablet 0  . Memantine HCl-Donepezil HCl (NAMZARIC) 28-10 MG CP24 Take 1 tablet by mouth every morning. 30 capsule 11  . metoprolol tartrate (LOPRESSOR) 25 MG tablet Take 0.5 tablets (12.5 mg total) by mouth 2 (two) times daily. 90 tablet 3  . mirabegron ER (MYRBETRIQ) 50 MG TB24 tablet Take 1 tablet (50 mg total) by mouth daily. 60 tablet 5  . Multiple Vitamin (MULTIVITAMIN WITH MINERALS) TABS tablet Take 1 tablet by mouth daily.    . Multiple Vitamins-Minerals (PRESERVISION AREDS 2 PO) Take 1 tablet by mouth 2 (two) times daily.     . nitroGLYCERIN (NITROSTAT) 0.4 MG SL tablet Place 1 tablet (0.4 mg total) under the tongue every 5 (five) minutes as needed for chest pain (x 3 doses daily). 30 tablet 3  . pantoprazole (PROTONIX) 40 MG tablet Take 1 tablet (40 mg total) by mouth 2 (two) times daily. 60 tablet 1  . QUEtiapine (SEROQUEL) 25 MG tablet Take 1 tablet (25 mg total) by mouth at bedtime as needed. 30 tablet 6   No current facility-administered medications on file prior to visit.     BP (!) 96/44 (BP Location: Right Arm)   Pulse 94   Temp (!) 97.5 F (36.4 C) (Oral)   Wt 180 lb (81.6 kg) Comment: Reported by son  SpO2 96%   BMI 22.50 kg/m         Objective:   Physical Exam  Constitutional: He is oriented to person, place, and time. He appears well-developed and well-nourished. No distress.  Cardiovascular: Normal  rate, regular rhythm, normal heart sounds and intact distal pulses.  Exam reveals no gallop and no friction rub.   No murmur heard. Pulmonary/Chest: Breath sounds normal. No respiratory distress. He has no wheezes. He has no rales. He exhibits no tenderness.  Neurological: He is alert and oriented to person, place, and time.  Skin: Skin is warm and dry. No rash noted. He is not diaphoretic. No erythema. No pallor.  Psychiatric: He has a normal mood and affect. His behavior is normal.  Vitals reviewed.      Assessment & Plan:  I am going to have family check when they get home to see if he is actually taking Norvasc. If he is then stop that medication, his blood pressure is too low and I think this is why he is feeling fatigued. I am going to check a CBC and UA to check for infection and make sure he is not anemic again - Family will follow up tomorrow   [name], NP

## 2016-12-16 LAB — CBC WITH DIFFERENTIAL/PLATELET
BASOS ABS: 0 10*3/uL (ref 0.0–0.1)
Basophils Relative: 0.8 % (ref 0.0–3.0)
EOS ABS: 0.3 10*3/uL (ref 0.0–0.7)
Eosinophils Relative: 6.7 % — ABNORMAL HIGH (ref 0.0–5.0)
HCT: 30.5 % — ABNORMAL LOW (ref 39.0–52.0)
HEMOGLOBIN: 9.6 g/dL — AB (ref 13.0–17.0)
LYMPHS PCT: 11.8 % — AB (ref 12.0–46.0)
Lymphs Abs: 0.5 10*3/uL — ABNORMAL LOW (ref 0.7–4.0)
MCHC: 31.4 g/dL (ref 30.0–36.0)
MCV: 82.2 fl (ref 78.0–100.0)
Monocytes Absolute: 0.6 10*3/uL (ref 0.1–1.0)
Monocytes Relative: 13.4 % — ABNORMAL HIGH (ref 3.0–12.0)
Neutro Abs: 2.9 10*3/uL (ref 1.4–7.7)
Neutrophils Relative %: 67.3 % (ref 43.0–77.0)
PLATELETS: 144 10*3/uL — AB (ref 150.0–400.0)
RBC: 3.71 Mil/uL — ABNORMAL LOW (ref 4.22–5.81)
RDW: 17.2 % — ABNORMAL HIGH (ref 11.5–15.5)
WBC: 4.2 10*3/uL (ref 4.0–10.5)

## 2016-12-16 LAB — POCT URINALYSIS DIPSTICK
Bilirubin, UA: NEGATIVE
Blood, UA: NEGATIVE
GLUCOSE UA: NEGATIVE
Ketones, UA: NEGATIVE
Leukocytes, UA: NEGATIVE
NITRITE UA: NEGATIVE
PROTEIN UA: NEGATIVE
Spec Grav, UA: 1.015 (ref 1.010–1.025)
UROBILINOGEN UA: 0.2 U/dL
pH, UA: 6 (ref 5.0–8.0)

## 2016-12-16 NOTE — Telephone Encounter (Signed)
Noted  

## 2016-12-16 NOTE — Addendum Note (Signed)
Addended by: Wyvonne Lenz on: 12/16/2016 01:13 PM   Modules accepted: Orders

## 2016-12-21 ENCOUNTER — Other Ambulatory Visit: Payer: Self-pay | Admitting: Adult Health

## 2016-12-21 DIAGNOSIS — F0281 Dementia in other diseases classified elsewhere with behavioral disturbance: Secondary | ICD-10-CM

## 2016-12-21 DIAGNOSIS — G309 Alzheimer's disease, unspecified: Principal | ICD-10-CM

## 2016-12-22 NOTE — Telephone Encounter (Signed)
Lorazepam last called in on 11/25/16 Last seen on 12/15/16 No future appointment scheduled Please advise.  Thanks!!

## 2016-12-23 ENCOUNTER — Other Ambulatory Visit: Payer: Self-pay | Admitting: Adult Health

## 2016-12-23 ENCOUNTER — Ambulatory Visit (INDEPENDENT_AMBULATORY_CARE_PROVIDER_SITE_OTHER): Payer: Medicare Other | Admitting: Internal Medicine

## 2016-12-23 ENCOUNTER — Ambulatory Visit
Admission: RE | Admit: 2016-12-23 | Discharge: 2016-12-23 | Disposition: A | Discharge status: Home / Self Care | Payer: Medicare Other | Source: Ambulatory Visit | Attending: Internal Medicine | Admitting: Internal Medicine

## 2016-12-23 ENCOUNTER — Encounter: Payer: Self-pay | Admitting: Internal Medicine

## 2016-12-23 VITALS — BP 104/50 | HR 75 | Ht 73.0 in

## 2016-12-23 DIAGNOSIS — W19XXXS Unspecified fall, sequela: Secondary | ICD-10-CM | POA: Diagnosis not present

## 2016-12-23 DIAGNOSIS — I482 Chronic atrial fibrillation: Secondary | ICD-10-CM

## 2016-12-23 DIAGNOSIS — I4821 Permanent atrial fibrillation: Secondary | ICD-10-CM

## 2016-12-23 DIAGNOSIS — N501 Vascular disorders of male genital organs: Secondary | ICD-10-CM | POA: Diagnosis not present

## 2016-12-23 DIAGNOSIS — I5030 Unspecified diastolic (congestive) heart failure: Secondary | ICD-10-CM

## 2016-12-23 DIAGNOSIS — M25551 Pain in right hip: Secondary | ICD-10-CM | POA: Diagnosis not present

## 2016-12-23 DIAGNOSIS — I5022 Chronic systolic (congestive) heart failure: Secondary | ICD-10-CM

## 2016-12-23 DIAGNOSIS — S79911A Unspecified injury of right hip, initial encounter: Secondary | ICD-10-CM | POA: Diagnosis not present

## 2016-12-23 DIAGNOSIS — Z23 Encounter for immunization: Secondary | ICD-10-CM | POA: Diagnosis not present

## 2016-12-23 LAB — CBC WITH DIFFERENTIAL/PLATELET
BASOS ABS: 0 10*3/uL (ref 0.0–0.2)
Basos: 1 %
EOS (ABSOLUTE): 0.1 10*3/uL (ref 0.0–0.4)
Eos: 3 %
Hematocrit: 30.1 % — ABNORMAL LOW (ref 37.5–51.0)
Hemoglobin: 9.5 g/dL — ABNORMAL LOW (ref 13.0–17.7)
Immature Grans (Abs): 0 10*3/uL (ref 0.0–0.1)
Immature Granulocytes: 0 %
LYMPHS ABS: 0.3 10*3/uL — AB (ref 0.7–3.1)
Lymphs: 8 %
MCH: 24.9 pg — AB (ref 26.6–33.0)
MCHC: 31.6 g/dL (ref 31.5–35.7)
MCV: 79 fL (ref 79–97)
Monocytes Absolute: 0.7 10*3/uL (ref 0.1–0.9)
Monocytes: 17 %
NEUTROS ABS: 3.1 10*3/uL (ref 1.4–7.0)
Neutrophils: 71 %
PLATELETS: 155 10*3/uL (ref 150–379)
RBC: 3.82 x10E6/uL — ABNORMAL LOW (ref 4.14–5.80)
RDW: 16.3 % — ABNORMAL HIGH (ref 12.3–15.4)
WBC: 4.3 10*3/uL (ref 3.4–10.8)

## 2016-12-23 LAB — BASIC METABOLIC PANEL
BUN/Creatinine Ratio: 22 (ref 10–24)
BUN: 21 mg/dL (ref 8–27)
CALCIUM: 9.4 mg/dL (ref 8.6–10.2)
CHLORIDE: 103 mmol/L (ref 96–106)
CO2: 25 mmol/L (ref 20–29)
Creatinine, Ser: 0.97 mg/dL (ref 0.76–1.27)
GFR calc non Af Amer: 70 mL/min/{1.73_m2} (ref >59)
GFR, EST AFRICAN AMERICAN: 81 mL/min/{1.73_m2} (ref >59)
GLUCOSE: 112 mg/dL — AB (ref 65–99)
POTASSIUM: 3.9 mmol/L (ref 3.5–5.2)
Sodium: 144 mmol/L (ref 134–144)

## 2016-12-23 NOTE — Progress Notes (Signed)
Follow-up Outpatient Visit Date: 12/23/2016  Primary Care Provider: [name], NP [address] [place] [address]  Chief Complaint: Falls  HPI:  [name] is a 83 y.o. year-old male with history of heart failure with preserved ejection fraction, permanent atrial fibrillation not on anticoagulation secondary to recurrent GI bleeds, remote pleural and pericardial effusion status post pericardiocentesis, bladder cancer, diabetes mellitus, GERD, and dementia, who presents for follow-up of HFpEF and fatigue in the setting of anemia. He presents today with is son, who provides most of the history.  Since his last visit with me in July, [name] was hospitalized with recurrent symptomatic anemia. Since leaving the hospital, he has been doing fairly well per his son's report. He has not indicated any chest pain or shortness of breath. His weight has been relatively stable over the last few weeks and is down compared with August after leaving the hospital. He is currently taking furosemide 80 mg every morning and 40 mg in the evenings on Mondays, Wednesdays, and Fridays. He has not had any bleeding.  However, over the last month, [name] has had 2 falls. He denies any pain, though his son/caregivers have noticed a bump just above the right hip. He continues to ambulate some with his walker and does not complain of pain.  --------------------------------------------------------------------------------------------------  Cardiovascular History & Procedures: Cardiovascular Problems:  HFpEF  Permanent atrial fibrillation  Risk Factors:  Hypertension, diabetes mellitus, male gender, and age > 14  Cath/PCI:  LHC (08/06/01): LMCA normal. LAD with mild luminal irregularities. Dominant LCx with mild luminal irregularities involving OM branches. Small RCA with mild luminal irregularities. LVEF 60-65%. LVEDP 11 mmHg.  CV Surgery:  Pericardiocentesis (01/10/15)  EP  Procedures and Devices:  None  Non-Invasive Evaluation(s):  TTE (08/20/16): Normal LV size with LVEF 45-50%. Mild AI. MAC with mild RM. Severe LA enlargement. Moderately dilated RV with mildly reduced RV contraction. Severe RA enlargement. Mild PH (RVSP 39 mmHg). Elevated CVP.  Pharmacologic MPI (01/23/17): No significant ischemia or scar. LV appears dilated. LVEF not calculated.  Recent CV Pertinent Labs: Lab Results  Component Value Date   CHOL 125 03/20/2015   HDL 48 03/20/2015   LDLCALC 64 03/20/2015   TRIG 66 03/20/2015   CHOLHDL 2.6 03/20/2015   INR 1.46 01/12/2015   BNP 91.6 01/10/2015   K 3.9 12/23/2016   MG 2.1 10/28/2016   BUN 21 12/23/2016   CREATININE 0.97 12/23/2016   CREATININE 0.74 03/20/2015    Past medical and surgical history were reviewed and updated in EPIC.  Current Meds  Medication Sig  . folic acid (FOLVITE) 1 MG tablet Take 1 mg by mouth 2 (two) times daily.   . iron polysaccharides (NIFEREX) 150 MG capsule Take 1 capsule (150 mg total) by mouth 2 (two) times daily.  . Memantine HCl-Donepezil HCl (NAMZARIC) 28-10 MG CP24 Take 1 tablet by mouth every morning.  . metoprolol tartrate (LOPRESSOR) 25 MG tablet Take 0.5 tablets (12.5 mg total) by mouth 2 (two) times daily.  . mirabegron ER (MYRBETRIQ) 50 MG TB24 tablet Take 1 tablet (50 mg total) by mouth daily.  . Multiple Vitamin (MULTIVITAMIN WITH MINERALS) TABS tablet Take 1 tablet by mouth daily.  . Multiple Vitamins-Minerals (PRESERVISION AREDS 2 PO) Take 1 tablet by mouth 2 (two) times daily.   . nitroGLYCERIN (NITROSTAT) 0.4 MG SL tablet Place 1 tablet (0.4 mg total) under the tongue every 5 (five) minutes as needed for chest pain (x 3 doses daily).  [name]  pantoprazole (PROTONIX) 40 MG tablet Take 1 tablet (40 mg total) by mouth 2 (two) times daily.  . QUEtiapine (SEROQUEL) 25 MG tablet Take 1 tablet (25 mg total) by mouth at bedtime as needed.  . [DISCONTINUED] furosemide (LASIX) 40 MG tablet Take 2  tablets (80 mg) by mouth in the morning ever day, if patient gain > 3 pounds overnight and/or > 5 pounds in a week can take extra 18m in the afternoon  . [DISCONTINUED] hydrOXYzine (ATARAX/VISTARIL) 10 MG tablet Take 10 mg by mouth 2 (two) times daily.  . [DISCONTINUED] LORazepam (ATIVAN) 0.5 MG tablet TAKE (1) TABLET TWICE A DAY AS NEEDED.    Allergies: Cephalexin; Furacin [nitrofurazone]; Macrodantin; and Zestril [lisinopril]  Social History   Social History  . Marital status: Married    Spouse name: BInez Catalina . Number of children: 4  . Years of education: 10th    Occupational History  . retired Retired   Social History Main Topics  . Smoking status: Former Smoker    Packs/day: 2.00    Years: 40.00    Types: Cigarettes    Quit date: 08/28/1975  . Smokeless tobacco: Former USystems developer   Types: Chew    Quit date: 162 . Alcohol use No  . Drug use: No  . Sexual activity: Not Currently   Other Topics Concern  . Not on file   Social History Narrative   Patient lives at home with his wife ([name] Married 643years.   Retired   Education 10 th grade   Right handed   Caffeine one or two daily     Family History  Problem Relation Age of Onset  . Kidney disease Mother   . Heart disease Mother   . Stroke Mother   . Cancer Father        prostate  . Mental illness Brother   . Diabetes Neg Hx   . COPD Neg Hx   . Heart attack Neg Hx   . Hypertension Neg Hx     Review of Systems: A 12-system review of systems was performed and was negative except as noted in the HPI.  --------------------------------------------------------------------------------------------------  Physical Exam: BP (!) 104/50   Pulse 75   Ht 6' 1"  (1.854 m)   General:  Frail, elderly man, seated comfortably in a wheelchair. He is accompanied by his son. HEENT: Conjunctival pallor noted without scleral icterus. Moist mucous membranes.  OP clear. Neck: Supple without lymphadenopathy, thyromegaly, JVD, or  HJR. Lungs: Normal work of breathing. Diminished breath sounds throughout, likely due to poor inspiratory effort. Heart: Distant heart sounds. Irregularly irregular without murmurs. Abd: Bowel sounds present. Soft, NT/ND without hepatosplenomegaly Ext: 1-2+ pretibial edema bilaterally. MSK: Firm soft-tissue mass overlying right iliac wing, most likely representing hematoma. No tenderness overlying mass or right hip. Skin: Warm and dry without rash.  EKG:  Atrial fibrillation (ventricular rate 75 bpm) with low voltage and non-specific IVCD.  Lab Results  Component Value Date   WBC 4.3 12/23/2016   HGB 9.5 (L) 12/23/2016   HCT 30.1 (L) 12/23/2016   MCV 79 12/23/2016   PLT 155 12/23/2016    Lab Results  Component Value Date   NA 144 12/23/2016   K 3.9 12/23/2016   CL 103 12/23/2016   CO2 25 12/23/2016   BUN 21 12/23/2016   CREATININE 0.97 12/23/2016   GLUCOSE 112 (H) 12/23/2016   ALT 15 (L) 10/28/2016    Lab Results  Component Value Date  CHOL 125 03/20/2015   HDL 48 03/20/2015   LDLCALC 64 03/20/2015   TRIG 66 03/20/2015   CHOLHDL 2.6 03/20/2015    --------------------------------------------------------------------------------------------------  ASSESSMENT AND PLAN: HFpEF Overall,weight is down a bit per home readings, though [name] still has 1-2+ edema in both of his legs. Certainly some of this could be dependent and related to venous insufficiency for spending much of his time seated. I have instructed his son to give an extra evening dose of furosemide 40 mg daily today and to continue monitoring [name]' weight. If he gains 2 pounds in 24 hours or 5 pounds in a week, an extra dose of furosemide 40 mg should be administered. Otherwise, we will continue the current regimen of 80 mg QAM and 40 mg QPM on M/W/F. I will check a BMP today to ensure stable renal function and electrolytes in the setting of diuresis.  Permanent atrial fibrillation Heart rate is  adequately controlled. He is not currently on any antiplatelet or antithrombotic agents due to recurrent symptomatic anemia and recent hospitalization in August requiring blood transfusion. We will check a CBC today, given likely hematoma overlying the pelvis after falls earlier this month.  Anemia Chronic problem without clear source. We will avoid all antiplatelet and antithrombotic agents at this time and recheck a CBC today.  Falls and right pelvic hematoma I stressed the importance of using a walker or other assist device to minimize the risk for falls. We will check a CBC today, as well as radiographs of the pelvis and right hip to exclude a fracture.  Influenza vaccination Seasonal flu vaccine administered today at the patient's request.  Follow-up: Return to clinic in 6 weeks.  Nelva Bush, MD 12/24/2016 7:30 AM

## 2016-12-23 NOTE — Telephone Encounter (Signed)
Ok to refill both medications 

## 2016-12-23 NOTE — Patient Instructions (Addendum)
Medication Instructions:  Take an extra lasix (furosemide) 40 mg this afternoon, otherwise continue current medication schedule. If you gain more than 2 pounds in 24 hours or 5 pounds in 1 week take an extra 40 mg of lasix.  Labwork: BMET/CBCd today  Testing/Procedures: Dr End recommends that you have an xray of your pelvis and right hip. [facility] [place]   Follow-Up: Your physician recommends that you schedule a follow-up appointment in: 6 weeks with Dr End.    Any Other Special Instructions Will Be Listed Below (If Applicable).     If you need a refill on your cardiac medications before your next appointment, please call your pharmacy.

## 2016-12-23 NOTE — Telephone Encounter (Signed)
Hydroxyzine sent to the pharmacy by e-scribe.  Lorazepam called in and left on machine.

## 2016-12-24 ENCOUNTER — Other Ambulatory Visit: Payer: Self-pay | Admitting: Adult Health

## 2016-12-24 MED ORDER — PANTOPRAZOLE SODIUM 40 MG PO TBEC: 40.0000 mg | DELAYED_RELEASE_TABLET | Freq: Two times a day (BID) | ORAL | 3 refills | Status: DC

## 2016-12-24 MED ORDER — POLYSACCHARIDE IRON COMPLEX 150 MG PO CAPS: 150.0000 mg | ORAL_CAPSULE | Freq: Two times a day (BID) | ORAL | 1 refills | Status: DC

## 2016-12-24 NOTE — Telephone Encounter (Signed)
Sent to the pharmacy by e-scribe. 

## 2016-12-24 NOTE — Telephone Encounter (Signed)
Medication sent to pharmacy  

## 2016-12-27 NOTE — Telephone Encounter (Signed)
Error

## 2017-01-06 ENCOUNTER — Encounter: Payer: Self-pay | Admitting: Adult Health

## 2017-01-10 ENCOUNTER — Telehealth: Payer: Self-pay

## 2017-01-10 NOTE — Telephone Encounter (Signed)
[name] called to report that he has fallen again. Most recently was this past Thu and he fell on the same hip he injured last time, Right side. They had previous xray and it was negative. He does have pain and bruising to the area. The son would like to have another xray if possible and he would like to take him to [facility] on [address]. Son is aware you are out of the office today and said a call back Tue would be fine.   Cory - Please advise. Thanks!

## 2017-01-11 NOTE — Telephone Encounter (Signed)
I am not sure if insurance is going to pay for an x ray without a office visit. It is ok to send them if they are ok with possibly having to pay for the x ray

## 2017-01-11 NOTE — Telephone Encounter (Signed)
Spoke to El[place] (son) and advised that x-ray may not be covered due to not having an office visit.  [name] stated he would bring pt in for ov and also needed him seen for cough. Scheduled to see [facility] on 01/12/17 @ 11 AM.

## 2017-01-12 ENCOUNTER — Encounter: Payer: Self-pay | Admitting: Adult Health

## 2017-01-12 ENCOUNTER — Ambulatory Visit (INDEPENDENT_AMBULATORY_CARE_PROVIDER_SITE_OTHER): Payer: Medicare Other | Admitting: Adult Health

## 2017-01-12 ENCOUNTER — Ambulatory Visit
Admission: RE | Admit: 2017-01-12 | Discharge: 2017-01-12 | Disposition: A | Discharge status: Home / Self Care | Payer: Medicare Other | Source: Ambulatory Visit | Attending: Adult Health | Admitting: Adult Health

## 2017-01-12 VITALS — BP 106/50 | HR 100 | Temp 99.9°F

## 2017-01-12 DIAGNOSIS — S79911A Unspecified injury of right hip, initial encounter: Secondary | ICD-10-CM | POA: Diagnosis not present

## 2017-01-12 DIAGNOSIS — T148XXA Other injury of unspecified body region, initial encounter: Secondary | ICD-10-CM

## 2017-01-12 DIAGNOSIS — W19XXXA Unspecified fall, initial encounter: Secondary | ICD-10-CM

## 2017-01-12 DIAGNOSIS — M25551 Pain in right hip: Secondary | ICD-10-CM | POA: Diagnosis not present

## 2017-01-12 DIAGNOSIS — R05 Cough: Secondary | ICD-10-CM | POA: Diagnosis not present

## 2017-01-12 LAB — CBC WITH DIFFERENTIAL/PLATELET
Basophils Absolute: 0 K/uL (ref 0.0–0.1)
Basophils Relative: 0.2 % (ref 0.0–3.0)
Eosinophils Absolute: 0.1 K/uL (ref 0.0–0.7)
Eosinophils Relative: 1.3 % (ref 0.0–5.0)
HCT: 26.2 % — ABNORMAL LOW (ref 39.0–52.0)
Hemoglobin: 8.3 g/dL — ABNORMAL LOW (ref 13.0–17.0)
Lymphocytes Relative: 3.9 % — ABNORMAL LOW (ref 12.0–46.0)
Lymphs Abs: 0.3 K/uL — ABNORMAL LOW (ref 0.7–4.0)
MCHC: 31.7 g/dL (ref 30.0–36.0)
MCV: 78.7 fl (ref 78.0–100.0)
Monocytes Absolute: 0.8 K/uL (ref 0.1–1.0)
Monocytes Relative: 11.7 % (ref 3.0–12.0)
Neutro Abs: 5.6 K/uL (ref 1.4–7.7)
Neutrophils Relative %: 82.9 % — ABNORMAL HIGH (ref 43.0–77.0)
Platelets: 197 K/uL (ref 150.0–400.0)
RBC: 3.33 Mil/uL — ABNORMAL LOW (ref 4.22–5.81)
RDW: 17.8 % — ABNORMAL HIGH (ref 11.5–15.5)
WBC: 6.8 K/uL (ref 4.0–10.5)

## 2017-01-12 NOTE — Progress Notes (Signed)
Subjective:    Patient ID: [name], male    DOB: 02-02-1928, 83 y.o.   MRN: [id number]  HPI  83 year old male who  has a past medical history of Anemia; Arrhythmia; Arthritis; Atrial fibrillation ([place]); Bladder cancer [facility]); CHF (congestive heart failure) ([place]); Dementia; ED (erectile dysfunction); Genital warts; GERD (gastroesophageal reflux disease); Hard of hearing; Hematochezia; History of blood transfusion; History of hiatal hernia; Hypertension; Irritable bladder; Memory disorder; Neuropathy; On home oxygen therapy; Pneumonia; Restless leg; and Type II diabetes mellitus ([place]). He presents to the office today s/p mechanical fall 6 nights ago. Family reports that he was getting up in the night to use the restroom and fell against his dresser. Family reports that he is walking slowly but not complaining of any pain.   He has a large bruise on his right lower back.   He has a history of right hip fracture    Review of Systems See HPI   Past Medical History:  Diagnosis Date  . Anemia    iron deficiency secondary to bleeding  . Arrhythmia    a fib  . Arthritis    "all over" (10/28/2016)  . Atrial fibrillation ([place])   . Bladder cancer (HCC)    BCG [name]  . CHF (congestive heart failure) ([place])   . Dementia    "he's had it for a number of years" (10/28/2016)  . ED (erectile dysfunction)   . Genital warts    recurrent. Not a problem  . GERD (gastroesophageal reflux disease)    treated with PPI  . Hard of hearing   . Hematochezia    has intermittent problem  . History of blood transfusion    "related to low HgB" (10/28/2016)  . History of hiatal hernia    "years ago" (10/28/2016)  . Hypertension    mild  . Irritable bladder   . Memory disorder   . Neuropathy    burning discomfort feet/legs  . On home oxygen therapy    "2L at night" (10/28/2016)  . Pneumonia    "twice" (10/28/2016)  . Restless leg    treats with heat  . Type II diabetes mellitus (HCC)    NIDDM on  oral medication    Social History   Social History  . Marital status: Married    Spouse name: [name]  . Number of children: 4  . Years of education: 10th    Occupational History  . retired Retired   Social History Main Topics  . Smoking status: Former Smoker    Packs/day: 2.00    Years: 40.00    Types: Cigarettes    Quit date: 08/28/1975  . Smokeless tobacco: Former Systems developer    Types: Chew    Quit date: 50  . Alcohol use No  . Drug use: No  . Sexual activity: Not Currently   Other Topics Concern  . Not on file   Social History Narrative   Patient lives at home with his wife [name]) Married 92 years.   Retired   Education 10 th grade   Right handed   Caffeine one or two daily     Past Surgical History:  Procedure Laterality Date  . APPENDECTOMY  1946  . CARDIAC CATHETERIZATION N/A 01/13/2015   Procedure: Pericardiocentesis;  Surgeon: Leonie Man, MD;  Location: [facility];  Service: Cardiovascular;  Laterality: N/A;  . CARDIOVASCULAR STRESS TEST  03/28/2003   EF 55%.  Normal cardiolite study  .  JOINT REPLACEMENT  1990's   left TKR  . KNEE ARTHROSCOPY Right 1980's  . TONSILLECTOMY  1938    Family History  Problem Relation Age of Onset  . Kidney disease Mother   . Heart disease Mother   . Stroke Mother   . Cancer Father        prostate  . Mental illness Brother   . Diabetes Neg Hx   . COPD Neg Hx   . Heart attack Neg Hx   . Hypertension Neg Hx     Allergies  Allergen Reactions  . Cephalexin Other (See Comments)    Unknown reaction  . Furacin [Nitrofurazone] Other (See Comments)    Unknown reaction  . Macrodantin Other (See Comments)    Unknown reaction  . Zestril [Lisinopril] Other (See Comments)    Unknown reaction    Current Outpatient Prescriptions on File Prior to Visit  Medication Sig Dispense Refill  . FERREX 150 150 MG capsule TAKE (1) CAPSULE TWICE DAILY. 440 capsule 1  . folic acid (FOLVITE) 1 MG tablet Take 1 mg by mouth 2  (two) times daily.     . furosemide (LASIX) 40 MG tablet Take 80 mg daily in the AM except on M-W-F take 80 mg in the AM and 40 mg in the PM.    . hydrOXYzine (ATARAX/VISTARIL) 10 MG tablet TAKE (1) TABLET TWICE A DAY AS NEEDED. 180 tablet 0  . iron polysaccharides (NIFEREX) 150 MG capsule Take 1 capsule (150 mg total) by mouth 2 (two) times daily. 180 capsule 1  . LORazepam (ATIVAN) 0.5 MG tablet TAKE (1) TABLET TWICE A DAY AS NEEDED. 60 tablet 2  . Memantine HCl-Donepezil HCl (NAMZARIC) 28-10 MG CP24 Take 1 tablet by mouth every morning. 30 capsule 11  . metoprolol tartrate (LOPRESSOR) 25 MG tablet Take 0.5 tablets (12.5 mg total) by mouth 2 (two) times daily. 90 tablet 3  . mirabegron ER (MYRBETRIQ) 50 MG TB24 tablet Take 1 tablet (50 mg total) by mouth daily. 60 tablet 5  . Multiple Vitamin (MULTIVITAMIN WITH MINERALS) TABS tablet Take 1 tablet by mouth daily.    . Multiple Vitamins-Minerals (PRESERVISION AREDS 2 PO) Take 1 tablet by mouth 2 (two) times daily.     . nitroGLYCERIN (NITROSTAT) 0.4 MG SL tablet Place 1 tablet (0.4 mg total) under the tongue every 5 (five) minutes as needed for chest pain (x 3 doses daily). 30 tablet 3  . pantoprazole (PROTONIX) 40 MG tablet TAKE 1 TABLET TWICE DAILY. 180 tablet 1  . QUEtiapine (SEROQUEL) 25 MG tablet Take 1 tablet (25 mg total) by mouth at bedtime as needed. 30 tablet 6   No current facility-administered medications on file prior to visit.     BP (!) 106/50 (BP Location: Right Arm)   Pulse 100   Temp 99.9 F (37.7 C) (Temporal)   SpO2 94%       Objective:   Physical Exam  Constitutional: He appears well-developed and well-nourished. No distress.  Cardiovascular: Normal rate, regular rhythm, normal heart sounds and intact distal pulses.  Exam reveals no gallop and no friction rub.   No murmur heard. Pulmonary/Chest: Effort normal and breath sounds normal. No respiratory distress. He has no wheezes. He has no rales. He exhibits no  tenderness.  Musculoskeletal: He exhibits deformity.  Unable to stand without assistance   Neurological: He is alert.  Skin: Skin is warm and dry. He is not diaphoretic.  Has a large hematoma with superficial bruising to  right lower back and hip.   Psychiatric: He has a normal mood and affect. His behavior is normal. Judgment and thought content normal. Cognition and memory are impaired.  Nursing note and vitals reviewed.      Assessment & Plan:  1. Hematoma - Due to history of anemia and needing frequent transfusions, will get CBC due to large hematoma. No redness or warmth noted. Advised heating pad. Will get xray of right hip and chest as well - Ortho consult if hip fracture  - DG HIP UNILAT WITH PELVIS 2-3 VIEWS RIGHT; Future - DG Chest 2 View; Future - CBC with Differential/Platelet  [name], NP

## 2017-01-17 NOTE — Progress Notes (Signed)
[facility]  PATIENT: [name] DOB: 07-Oct-1927   REASON FOR VISIT: Follow-up for dementia , Frequent falls HISTORY FROM: son and wife    HISTORY OF PRESENT ILLNESS:HISTORY: [name] is a 83 yo RH referred by cardiologist [name], Primary care is Dr. Timoteo Gaul, for evaluation of memory trouble, gait difficulty, he is accompanied by his wife, and daughter at today's clinical visit. He had a past medical history of chronic atrial fibrillation, was on Coumadin treatment, but developed recurrent GI bleeding, from reviewing the chart, he is not on any anticoagulation treatment, he also had past medical history of obesity, sedentary lifestyle, diabetes, Family noticed he had gradual onset memory trouble over the past few years, has been taking Aricept 10 mg every day, he also has gradual onset gait difficulty, began to use cane since 2012, he has no significant low back pain or neck pain, recent 12 months, since 2014, he has worsening gait difficulty, urinary incontinence, urgency, worsening memory trouble, he tends to repeat himself, forget familiar people's name, forgot to turn off stove, he has quit driving for a few months now, He has bilateral lower extremity edema, recent echocardiogram showed ejection fraction 60-65%, moderate mitral regurgitation, mild aortic stenosis  He went to school for 10 years, was a Armed forces operational officer, was able to retired at age 65, traveled all over the world, but has become sedentary over the past 10 years, he still enjoys playing his computer games   UPDATE May 7th 2015:I have reviewed MRI with patient and his family, MRI of brain showed moderate atrophy, mild to moderate periventricular small vessel disease. MRI of lumbar showed L4-5: pseudo-disc bulging with facet hypertrophy with moderate biforaminal foraminal stenosis, L5-S1: severe facet arthropathy with no spinal stenosis or foraminal narrowing. At L1-2, L2-3, L3-4: disc bulging and  facet hypertrophy with mild biforaminal foraminal stenosis  Laboratory showed a mild anemia, ferritin was 15, otherwise normal B12, CMP, Family reported multiple episodes of anemia, in the past, no etiology was found, he had physical therapy for 3 weeks, no significant change, he denies low back pain, does has urinary urgency, occasionally incontinence episodes. He has been taking gabapentin 300 mg every night which has helped his sleep, UPDATE Dec 03 2014: He is with his wife and [name] at today's visit.He has worsening memory trouble,    UPDATE June 08 2016:YY He is with his son at today's clinical visit, He lives with his wife, has 24-hour caregivers, he ambulate with walker at house, able to go to the dining table, frequent urination,  He just recovered from UTI, was noted to have increased confusion, now has improved  He is now taking Lorzepam 0.5mg  bid, which help controlling his evening time agitations, for a while he require as needed Haldol UPDATE  10/30/2018CM  [name] , 83 year old male returns for follow-up  With  History of  progressive  Dementia. He was seen by primary care on  01/12/2017 after falling against a dresser and having a hematoma  On his right lower back..Hip and pelvic x-rays were negative,  No fracture  He was placed on Namzaric  After his last visit with Dr. Krista Blue.  He has not had issues tolerating the  Medication. He has 24 7 caregivers. He has an alarm on his bed but the batteries are not working Son intends to fix that today.  Appetite is good. He takes melatonin for sleep. He currently has less confusion according to the son. He is  dependent for all activities of daily living. He is incontinent of bowel and bladder and wears depends. MMSE is stable. He takes Seroquel for agitation He returns for reevaluation. Most recent CBC 8.3 REVIEW OF SYSTEMS: Full 14 system review of systems performed and notable only for those listed, all others are neg:  Constitutional:  neg  Cardiovascular: neg Ear/Nose/Throat: neg  Skin: neg Eyes: neg Respiratory: neg Gastroitestinal:  Incontinence of bowel and bladder Hematology/Lymphatic:  easy bruising ,  anemia Endocrine: neg Musculoskeletal: frequent falls Allergy/Immunology: neg Neurological:  weakness Psychiatric: neg Sleep : neg   ALLERGIES: Allergies  Allergen Reactions  . Cephalexin Other (See Comments)    Unknown reaction  . Furacin [Nitrofurazone] Other (See Comments)    Unknown reaction  . Macrodantin Other (See Comments)    Unknown reaction  . Zestril [Lisinopril] Other (See Comments)    Unknown reaction    HOME MEDICATIONS: Outpatient Medications Prior to Visit  Medication Sig Dispense Refill  . FERREX 150 150 MG capsule TAKE (1) CAPSULE TWICE DAILY. 601 capsule 1  . folic acid (FOLVITE) 1 MG tablet Take 1 mg by mouth 2 (two) times daily.     . furosemide (LASIX) 40 MG tablet Take 80 mg daily in the AM except on M-W-F take 80 mg in the AM and 40 mg in the PM.    . hydrOXYzine (ATARAX/VISTARIL) 10 MG tablet TAKE (1) TABLET TWICE A DAY AS NEEDED. 180 tablet 0  . iron polysaccharides (NIFEREX) 150 MG capsule Take 1 capsule (150 mg total) by mouth 2 (two) times daily. 180 capsule 1  . LORazepam (ATIVAN) 0.5 MG tablet TAKE (1) TABLET TWICE A DAY AS NEEDED. 60 tablet 2  . Memantine HCl-Donepezil HCl (NAMZARIC) 28-10 MG CP24 Take 1 tablet by mouth every morning. 30 capsule 11  . metoprolol tartrate (LOPRESSOR) 25 MG tablet Take 0.5 tablets (12.5 mg total) by mouth 2 (two) times daily. 90 tablet 3  . mirabegron ER (MYRBETRIQ) 50 MG TB24 tablet Take 1 tablet (50 mg total) by mouth daily. 60 tablet 5  . Multiple Vitamin (MULTIVITAMIN WITH MINERALS) TABS tablet Take 1 tablet by mouth daily.    . Multiple Vitamins-Minerals (PRESERVISION AREDS 2 PO) Take 1 tablet by mouth 2 (two) times daily.     . nitroGLYCERIN (NITROSTAT) 0.4 MG SL tablet Place 1 tablet (0.4 mg total) under the tongue every 5 (five)  minutes as needed for chest pain (x 3 doses daily). 30 tablet 3  . pantoprazole (PROTONIX) 40 MG tablet TAKE 1 TABLET TWICE DAILY. 180 tablet 1  . QUEtiapine (SEROQUEL) 25 MG tablet Take 1 tablet (25 mg total) by mouth at bedtime as needed. 30 tablet 6   No facility-administered medications prior to visit.     PAST MEDICAL HISTORY: Past Medical History:  Diagnosis Date  . Anemia    iron deficiency secondary to bleeding  . Arrhythmia    a fib  . Arthritis    "all over" (10/28/2016)  . Atrial fibrillation ([place])   . Bladder cancer (HCC)    BCG [name]  . CHF (congestive heart failure) ([place])   . Dementia    "he's had it for a number of years" (10/28/2016)  . ED (erectile dysfunction)   . Genital warts    recurrent. Not a problem  . GERD (gastroesophageal reflux disease)    treated with PPI  . Hard of hearing   . Hematochezia    has intermittent problem  . History of  blood transfusion    "related to low HgB" (10/28/2016)  . History of hiatal hernia    "years ago" (10/28/2016)  . Hypertension    mild  . Irritable bladder   . Memory disorder   . Neuropathy    burning discomfort feet/legs  . On home oxygen therapy    "2L at night" (10/28/2016)  . Pneumonia    "twice" (10/28/2016)  . Restless leg    treats with heat  . Type II diabetes mellitus (HCC)    NIDDM on oral medication    PAST SURGICAL HISTORY: Past Surgical History:  Procedure Laterality Date  . APPENDECTOMY  1946  . CARDIAC CATHETERIZATION N/A 01/13/2015   Procedure: Pericardiocentesis;  Surgeon: Leonie Man, MD;  Location: [facility];  Service: Cardiovascular;  Laterality: N/A;  . CARDIOVASCULAR STRESS TEST  03/28/2003   EF 55%.  Normal cardiolite study  . JOINT REPLACEMENT  1990's   left TKR  . KNEE ARTHROSCOPY Right 1980's  . TONSILLECTOMY  1938    FAMILY HISTORY: Family History  Problem Relation Age of Onset  . Kidney disease Mother   . Heart disease Mother   . Stroke Mother   . Cancer  Father        prostate  . Mental illness Brother   . Diabetes Neg Hx   . COPD Neg Hx   . Heart attack Neg Hx   . Hypertension Neg Hx     SOCIAL HISTORY: Social History   Social History  . Marital status: Married    Spouse name: [name]  . Number of children: 4  . Years of education: 10th    Occupational History  . retired Retired   Social History Main Topics  . Smoking status: Former Smoker    Packs/day: 2.00    Years: 40.00    Types: Cigarettes    Quit date: 08/28/1975  . Smokeless tobacco: Former Systems developer    Types: Chew    Quit date: 21  . Alcohol use No  . Drug use: No  . Sexual activity: Not Currently   Other Topics Concern  . Not on file   Social History Narrative   Patient lives at home with his wife [name]) Married 42 years.   Retired   Education 10 th grade   Right handed   Caffeine one or two daily      PHYSICAL EXAM  Vitals:   01/18/17 1409  BP: (!) 102/54  Pulse: 74  Height: 6\' 1"  (1.854 m)   There is no height or weight on file to calculate BMI.  Generalized: Well developed, in no acute distress  Head: normocephalic and atraumatic,. Oropharynx benign  Neck: Supple, no carotid bruits  Cardiac: Regular rate rhythm, no murmur  Musculoskeletal: No deformity   Neurological examination   Mentation: Alert AFT 3  MMSE - Mini Mental State Exam 01/18/2017 12/11/2015 06/02/2015  Orientation to time 0 0 3  Orientation to Place 1 3 1   Registration 3 3 3   Attention/ Calculation 1 2 0  Recall 0 0 0  Language- name 2 objects 2 2 2   Language- repeat 0 0 1  Language- follow 3 step command 2 3 3   Language- read & follow direction 0 1 0  Write a sentence 0 0 0  Copy design 0 0 0  Total score 9 14 13    Follows most  commands speech and language fluent. Depends on son to provide history  Cranial nerve II-XII: Pupils were  equal round reactive to light extraocular movements were full, visual field were full on confrontational test. Facial sensation and  strength were normal. hearing was intact to finger rubbing bilaterally. Uvula tongue midline. head turning and shoulder shrug were normal and symmetric.Tongue protrusion into cheek strength was normal. Motor: mild rigidity but no significant weakness Sensory: normal and symmetric to light touch, in the upper and lower extremities Coordination: finger-nose-finger,  no dysmetria Reflexes: depressed upper and lower, plantar responses were flexor bilaterally. Gait and Station: in wheelchair not ambulated due to safety concerns DIAGNOSTIC DATA (LABS, IMAGING, TESTING) - I reviewed patient records, labs, notes, testing and imaging myself where available.  Lab Results  Component Value Date   WBC 6.8 01/12/2017   HGB 8.3 Repeated and verified X2. (L) 01/12/2017   HCT 26.2 Repeated and verified X2. (L) 01/12/2017   MCV 78.7 01/12/2017   PLT 197.0 01/12/2017      Component Value Date/Time   NA 144 12/23/2016 1108   K 3.9 12/23/2016 1108   CL 103 12/23/2016 1108   CO2 25 12/23/2016 1108   GLUCOSE 112 (H) 12/23/2016 1108   GLUCOSE 100 (H) 11/02/2016 1521   BUN 21 12/23/2016 1108   CREATININE 0.97 12/23/2016 1108   CREATININE 0.74 03/20/2015 1259   CALCIUM 9.4 12/23/2016 1108   PROT 5.8 (L) 10/28/2016 1527   ALBUMIN 3.2 (L) 10/28/2016 1527   AST 19 10/28/2016 1527   ALT 15 (L) 10/28/2016 1527   ALKPHOS 93 10/28/2016 1527   BILITOT 0.5 10/28/2016 1527   GFRNONAA 70 12/23/2016 1108   GFRAA 81 12/23/2016 1108   Lab Results  Component Value Date   CHOL 125 03/20/2015   HDL 48 03/20/2015   LDLCALC 64 03/20/2015   TRIG 66 03/20/2015   CHOLHDL 2.6 03/20/2015   Lab Results  Component Value Date   HGBA1C 4.5 (L) 07/16/2014   Lab Results  Component Value Date   VITAMINB12 836 07/10/2014   Lab Results  Component Value Date   TSH 2.71 02/18/2016      ASSESSMENT AND PLAN 83 y.o. year old male  Dementia with behavior issues, slow worsening. Several falls recently with no apparent  injury Has history of anemia most recent hemoglobin 8.3. Patient has 24 7 caregivers.             Continue Namzaric daily for memory Continue Seroquel for agitation/behavior Follow up in 6 months Continue 24 7 care I spent 25 minutes in total face to face time with the patient more than 50% of which was spent counseling and coordination of care, reviewing test results reviewing medications and discussing and reviewing the diagnosis of progressive dementia and fall risk and further treatment options. , Rayburn Ma, [facility], APRN  [facility] [address] [place], [place] [address] [phone]

## 2017-01-18 ENCOUNTER — Encounter: Payer: Self-pay | Admitting: Nurse Practitioner

## 2017-01-18 ENCOUNTER — Ambulatory Visit (INDEPENDENT_AMBULATORY_CARE_PROVIDER_SITE_OTHER): Payer: Medicare Other | Admitting: Nurse Practitioner

## 2017-01-18 VITALS — BP 102/54 | HR 74 | Ht 73.0 in

## 2017-01-18 DIAGNOSIS — R269 Unspecified abnormalities of gait and mobility: Secondary | ICD-10-CM

## 2017-01-18 DIAGNOSIS — D500 Iron deficiency anemia secondary to blood loss (chronic): Secondary | ICD-10-CM

## 2017-01-18 DIAGNOSIS — F0391 Unspecified dementia with behavioral disturbance: Secondary | ICD-10-CM

## 2017-01-18 MED ORDER — QUETIAPINE FUMARATE 25 MG PO TABS: 25.0000 mg | ORAL_TABLET | Freq: Every evening | ORAL | 6 refills | Status: DC | PRN

## 2017-01-18 NOTE — Patient Instructions (Signed)
Continue Namzaric daily for memory Follow up in 6 months

## 2017-01-19 ENCOUNTER — Ambulatory Visit (INDEPENDENT_AMBULATORY_CARE_PROVIDER_SITE_OTHER): Payer: Medicare Other | Admitting: Adult Health

## 2017-01-19 ENCOUNTER — Other Ambulatory Visit: Payer: Self-pay | Admitting: Neurology

## 2017-01-19 VITALS — BP 96/54 | HR 105 | Temp 98.5°F

## 2017-01-19 DIAGNOSIS — Z7982 Long term (current) use of aspirin: Secondary | ICD-10-CM | POA: Diagnosis not present

## 2017-01-19 DIAGNOSIS — I4891 Unspecified atrial fibrillation: Secondary | ICD-10-CM | POA: Diagnosis not present

## 2017-01-19 DIAGNOSIS — R296 Repeated falls: Secondary | ICD-10-CM | POA: Diagnosis not present

## 2017-01-19 DIAGNOSIS — Z79899 Other long term (current) drug therapy: Secondary | ICD-10-CM | POA: Diagnosis not present

## 2017-01-19 DIAGNOSIS — S59901A Unspecified injury of right elbow, initial encounter: Secondary | ICD-10-CM

## 2017-01-19 DIAGNOSIS — I10 Essential (primary) hypertension: Secondary | ICD-10-CM | POA: Diagnosis not present

## 2017-01-19 DIAGNOSIS — I509 Heart failure, unspecified: Secondary | ICD-10-CM | POA: Diagnosis not present

## 2017-01-19 DIAGNOSIS — Z888 Allergy status to other drugs, medicaments and biological substances status: Secondary | ICD-10-CM | POA: Diagnosis not present

## 2017-01-19 DIAGNOSIS — E119 Type 2 diabetes mellitus without complications: Secondary | ICD-10-CM | POA: Diagnosis not present

## 2017-01-19 DIAGNOSIS — I110 Hypertensive heart disease with heart failure: Secondary | ICD-10-CM | POA: Diagnosis not present

## 2017-01-19 DIAGNOSIS — S51011A Laceration without foreign body of right elbow, initial encounter: Secondary | ICD-10-CM | POA: Diagnosis not present

## 2017-01-19 DIAGNOSIS — F0390 Unspecified dementia without behavioral disturbance: Secondary | ICD-10-CM | POA: Diagnosis not present

## 2017-01-19 DIAGNOSIS — W19XXXA Unspecified fall, initial encounter: Secondary | ICD-10-CM | POA: Diagnosis not present

## 2017-01-19 DIAGNOSIS — D649 Anemia, unspecified: Secondary | ICD-10-CM | POA: Diagnosis not present

## 2017-01-19 DIAGNOSIS — Z881 Allergy status to other antibiotic agents status: Secondary | ICD-10-CM | POA: Diagnosis not present

## 2017-01-19 NOTE — Progress Notes (Signed)
Subjective:    Patient ID: [name], male    DOB: 07-12-1927, 83 y.o.   MRN: [id number]  HPI  83 year old male who  has a past medical history of Anemia; Arrhythmia; Arthritis; Atrial fibrillation ([place]); Bladder cancer [facility]); CHF (congestive heart failure) ([place]); Dementia; ED (erectile dysfunction); Genital warts; GERD (gastroesophageal reflux disease); Hard of hearing; Hematochezia; History of blood transfusion; History of hiatal hernia; Hypertension; Irritable bladder; Memory disorder; Neuropathy; On home oxygen therapy; Pneumonia; Restless leg; and Type II diabetes mellitus ([place]).   He presents to the office for laceration to right elbow he sustained 4 nights ago. His son who is with him during this visit reports that [name] got up in the middle of the night and fell into his dressor. He sustained an injury to the elbow. Family called EMS who bandaged the wound. No further instructions were given.   Family is here to have wound checked out    Review of Systems See HPI   Past Medical History:  Diagnosis Date  . Anemia    iron deficiency secondary to bleeding  . Arrhythmia    a fib  . Arthritis    "all over" (10/28/2016)  . Atrial fibrillation ([place])   . Bladder cancer (HCC)    BCG [name]  . CHF (congestive heart failure) ([place])   . Dementia    "he's had it for a number of years" (10/28/2016)  . ED (erectile dysfunction)   . Genital warts    recurrent. Not a problem  . GERD (gastroesophageal reflux disease)    treated with PPI  . Hard of hearing   . Hematochezia    has intermittent problem  . History of blood transfusion    "related to low HgB" (10/28/2016)  . History of hiatal hernia    "years ago" (10/28/2016)  . Hypertension    mild  . Irritable bladder   . Memory disorder   . Neuropathy    burning discomfort feet/legs  . On home oxygen therapy    "2L at night" (10/28/2016)  . Pneumonia    "twice" (10/28/2016)  . Restless leg    treats with heat  . Type II  diabetes mellitus (HCC)    NIDDM on oral medication    Social History   Social History  . Marital status: Married    Spouse name: [name]  . Number of children: 4  . Years of education: 10th    Occupational History  . retired Retired   Social History Main Topics  . Smoking status: Former Smoker    Packs/day: 2.00    Years: 40.00    Types: Cigarettes    Quit date: 08/28/1975  . Smokeless tobacco: Former Systems developer    Types: Chew    Quit date: 22  . Alcohol use No  . Drug use: No  . Sexual activity: Not Currently   Other Topics Concern  . Not on file   Social History Narrative   Patient lives at home with his wife [name]) Married 41 years.   Retired   Education 10 th grade   Right handed   Caffeine one or two daily     Past Surgical History:  Procedure Laterality Date  . APPENDECTOMY  1946  . CARDIAC CATHETERIZATION N/A 01/13/2015   Procedure: Pericardiocentesis;  Surgeon: Leonie Man, MD;  Location: [facility];  Service: Cardiovascular;  Laterality: N/A;  . CARDIOVASCULAR STRESS TEST  03/28/2003   EF 55%.  Normal cardiolite study  . JOINT REPLACEMENT  1990's   left TKR  . KNEE ARTHROSCOPY Right 1980's  . TONSILLECTOMY  1938    Family History  Problem Relation Age of Onset  . Kidney disease Mother   . Heart disease Mother   . Stroke Mother   . Cancer Father        prostate  . Mental illness Brother   . Diabetes Neg Hx   . COPD Neg Hx   . Heart attack Neg Hx   . Hypertension Neg Hx     Allergies  Allergen Reactions  . Cephalexin Other (See Comments)    Unknown reaction  . Furacin [Nitrofurazone] Other (See Comments)    Unknown reaction  . Macrodantin Other (See Comments)    Unknown reaction  . Zestril [Lisinopril] Other (See Comments)    Unknown reaction    Current Outpatient Prescriptions on File Prior to Visit  Medication Sig Dispense Refill  . FERREX 150 150 MG capsule TAKE (1) CAPSULE TWICE DAILY. 174 capsule 1  . folic acid  (FOLVITE) 1 MG tablet Take 1 mg by mouth 2 (two) times daily.     . furosemide (LASIX) 40 MG tablet Take 80 mg daily in the AM except on M-W-F take 80 mg in the AM and 40 mg in the PM.    . hydrOXYzine (ATARAX/VISTARIL) 10 MG tablet TAKE (1) TABLET TWICE A DAY AS NEEDED. 180 tablet 0  . iron polysaccharides (NIFEREX) 150 MG capsule Take 1 capsule (150 mg total) by mouth 2 (two) times daily. 180 capsule 1  . LORazepam (ATIVAN) 0.5 MG tablet TAKE (1) TABLET TWICE A DAY AS NEEDED. 60 tablet 2  . Melatonin 3 MG CAPS Take by mouth. Takes nightly    . Memantine HCl-Donepezil HCl (NAMZARIC) 28-10 MG CP24 Take 1 tablet by mouth every morning. 30 capsule 11  . metoprolol tartrate (LOPRESSOR) 25 MG tablet Take 0.5 tablets (12.5 mg total) by mouth 2 (two) times daily. 90 tablet 3  . mirabegron ER (MYRBETRIQ) 50 MG TB24 tablet Take 1 tablet (50 mg total) by mouth daily. 60 tablet 5  . Multiple Vitamin (MULTIVITAMIN WITH MINERALS) TABS tablet Take 1 tablet by mouth daily.    . Multiple Vitamins-Minerals (PRESERVISION AREDS 2 PO) Take 1 tablet by mouth 2 (two) times daily.     . nitroGLYCERIN (NITROSTAT) 0.4 MG SL tablet Place 1 tablet (0.4 mg total) under the tongue every 5 (five) minutes as needed for chest pain (x 3 doses daily). 30 tablet 3  . pantoprazole (PROTONIX) 40 MG tablet TAKE 1 TABLET TWICE DAILY. 180 tablet 1  . QUEtiapine (SEROQUEL) 25 MG tablet Take 1 tablet (25 mg total) by mouth at bedtime as needed. 30 tablet 6   No current facility-administered medications on file prior to visit.     BP (!) 96/54   Pulse (!) 105   Temp 98.5 F (36.9 C) (Oral)   SpO2 95%       Objective:   Physical Exam  Constitutional: He is oriented to person, place, and time. He appears well-developed and well-nourished. No distress.  Neurological: He is alert and oriented to person, place, and time.  Skin: Skin is warm and dry. He is not diaphoretic.  Gapping wound over right olecranon. Bone and tendon  visualized. No active bleeding noted   Psychiatric: He has a normal mood and affect. His behavior is normal. Judgment and thought content normal.  Nursing note and vitals reviewed.  Assessment & Plan:  1. Injury of right elbow, initial encounter - Sent to local ER via private vehicle for wash out, laceration repair, and probable IV antibiotics   [name], NP

## 2017-01-20 NOTE — Progress Notes (Signed)
I have reviewed and agreed above plan. 

## 2017-01-21 ENCOUNTER — Other Ambulatory Visit: Payer: Self-pay | Admitting: Family Medicine

## 2017-01-21 NOTE — Telephone Encounter (Signed)
Can we see how often he is taking this?

## 2017-01-24 ENCOUNTER — Other Ambulatory Visit: Payer: Self-pay | Admitting: Adult Health

## 2017-01-24 ENCOUNTER — Other Ambulatory Visit (INDEPENDENT_AMBULATORY_CARE_PROVIDER_SITE_OTHER): Payer: Medicare Other

## 2017-01-24 DIAGNOSIS — I10 Essential (primary) hypertension: Secondary | ICD-10-CM

## 2017-01-24 LAB — BASIC METABOLIC PANEL
BUN: 16 mg/dL (ref 6–23)
CHLORIDE: 100 meq/L (ref 96–112)
CO2: 29 meq/L (ref 19–32)
CREATININE: 0.91 mg/dL (ref 0.40–1.50)
Calcium: 9.3 mg/dL (ref 8.4–10.5)
GFR: 83.61 mL/min (ref >60.00)
Glucose, Bld: 106 mg/dL — ABNORMAL HIGH (ref 70–99)
POTASSIUM: 4.2 meq/L (ref 3.5–5.1)
Sodium: 137 mEq/L (ref 135–145)

## 2017-01-25 ENCOUNTER — Telehealth: Payer: Self-pay | Admitting: Adult Health

## 2017-01-25 DIAGNOSIS — E1140 Type 2 diabetes mellitus with diabetic neuropathy, unspecified: Secondary | ICD-10-CM | POA: Diagnosis not present

## 2017-01-25 DIAGNOSIS — F0390 Unspecified dementia without behavioral disturbance: Secondary | ICD-10-CM | POA: Diagnosis not present

## 2017-01-25 DIAGNOSIS — Z9981 Dependence on supplemental oxygen: Secondary | ICD-10-CM | POA: Diagnosis not present

## 2017-01-25 DIAGNOSIS — I4891 Unspecified atrial fibrillation: Secondary | ICD-10-CM | POA: Diagnosis not present

## 2017-01-25 DIAGNOSIS — M1991 Primary osteoarthritis, unspecified site: Secondary | ICD-10-CM | POA: Diagnosis not present

## 2017-01-25 DIAGNOSIS — I509 Heart failure, unspecified: Secondary | ICD-10-CM | POA: Diagnosis not present

## 2017-01-25 DIAGNOSIS — S51011D Laceration without foreign body of right elbow, subsequent encounter: Secondary | ICD-10-CM | POA: Diagnosis not present

## 2017-01-25 DIAGNOSIS — Z7984 Long term (current) use of oral hypoglycemic drugs: Secondary | ICD-10-CM | POA: Diagnosis not present

## 2017-01-25 DIAGNOSIS — I110 Hypertensive heart disease with heart failure: Secondary | ICD-10-CM | POA: Diagnosis not present

## 2017-01-25 DIAGNOSIS — C679 Malignant neoplasm of bladder, unspecified: Secondary | ICD-10-CM | POA: Diagnosis not present

## 2017-01-25 NOTE — Telephone Encounter (Signed)
Home Health Care called to let Dr. Elease Hashimoto know that they are just know going out to the patients house today to open his case.

## 2017-01-25 NOTE — Telephone Encounter (Signed)
Son would like you to thank you for sending them to [facility] yesterday. [facility] took very good care of him. They were very pleased

## 2017-01-25 NOTE — Telephone Encounter (Signed)
Ok to refill for 30 pills

## 2017-01-25 NOTE — Telephone Encounter (Signed)
Spoke to [place] (son) and pt is using PRN for dementia "spells."  When pt becomes angry and intolerable than he is given medication.  Has been given 1 tablet in the last month.  Does not use very often.  Please advise.

## 2017-01-26 ENCOUNTER — Other Ambulatory Visit: Payer: Medicare Other

## 2017-01-26 ENCOUNTER — Telehealth: Payer: Self-pay | Admitting: Family Medicine

## 2017-01-26 DIAGNOSIS — S51011D Laceration without foreign body of right elbow, subsequent encounter: Secondary | ICD-10-CM | POA: Diagnosis not present

## 2017-01-26 DIAGNOSIS — I110 Hypertensive heart disease with heart failure: Secondary | ICD-10-CM | POA: Diagnosis not present

## 2017-01-26 DIAGNOSIS — I4891 Unspecified atrial fibrillation: Secondary | ICD-10-CM | POA: Diagnosis not present

## 2017-01-26 DIAGNOSIS — E1140 Type 2 diabetes mellitus with diabetic neuropathy, unspecified: Secondary | ICD-10-CM | POA: Diagnosis not present

## 2017-01-26 DIAGNOSIS — I509 Heart failure, unspecified: Secondary | ICD-10-CM | POA: Diagnosis not present

## 2017-01-26 DIAGNOSIS — F0390 Unspecified dementia without behavioral disturbance: Secondary | ICD-10-CM | POA: Diagnosis not present

## 2017-01-26 MED ORDER — HALOPERIDOL 0.5 MG PO TABS: ORAL_TABLET | ORAL | 0 refills | Status: DC

## 2017-01-26 NOTE — Telephone Encounter (Signed)
Sent to the pharmacy by e-scribe. 

## 2017-01-26 NOTE — Telephone Encounter (Signed)
Son state that the pt is very weak and would like to see if hemoglobin can be done due to the pt being so weak.  Son was given the copy of the labs that were already released on mychart.

## 2017-01-26 NOTE — Telephone Encounter (Signed)
pts son is in the office for a visit and wanted to see if the results are ready.

## 2017-01-26 NOTE — Telephone Encounter (Signed)
Copied from [place] #[id number]. Topic: Quick Communication - Lab Results >> Jan 26, 2017  2:27 PM Scherrie Gerlach wrote: Family would like results of labs, at least released in mychart please!

## 2017-01-26 NOTE — Telephone Encounter (Signed)
Noted  

## 2017-01-27 ENCOUNTER — Other Ambulatory Visit: Payer: Self-pay | Admitting: Adult Health

## 2017-01-27 ENCOUNTER — Other Ambulatory Visit (INDEPENDENT_AMBULATORY_CARE_PROVIDER_SITE_OTHER): Payer: Medicare Other

## 2017-01-27 DIAGNOSIS — I509 Heart failure, unspecified: Secondary | ICD-10-CM | POA: Diagnosis not present

## 2017-01-27 DIAGNOSIS — S51011D Laceration without foreign body of right elbow, subsequent encounter: Secondary | ICD-10-CM | POA: Diagnosis not present

## 2017-01-27 DIAGNOSIS — I110 Hypertensive heart disease with heart failure: Secondary | ICD-10-CM | POA: Diagnosis not present

## 2017-01-27 DIAGNOSIS — D500 Iron deficiency anemia secondary to blood loss (chronic): Secondary | ICD-10-CM

## 2017-01-27 DIAGNOSIS — E1140 Type 2 diabetes mellitus with diabetic neuropathy, unspecified: Secondary | ICD-10-CM | POA: Diagnosis not present

## 2017-01-27 DIAGNOSIS — F0390 Unspecified dementia without behavioral disturbance: Secondary | ICD-10-CM | POA: Diagnosis not present

## 2017-01-27 DIAGNOSIS — I4891 Unspecified atrial fibrillation: Secondary | ICD-10-CM | POA: Diagnosis not present

## 2017-01-27 LAB — CBC WITH DIFFERENTIAL/PLATELET
BASOS ABS: 0 10*3/uL (ref 0.0–0.1)
Basophils Relative: 0.5 % (ref 0.0–3.0)
Eosinophils Absolute: 0.1 10*3/uL (ref 0.0–0.7)
Eosinophils Relative: 2 % (ref 0.0–5.0)
HEMATOCRIT: 28.5 % — AB (ref 39.0–52.0)
Hemoglobin: 8.9 g/dL — ABNORMAL LOW (ref 13.0–17.0)
LYMPHS PCT: 10.6 % — AB (ref 12.0–46.0)
Lymphs Abs: 0.5 10*3/uL — ABNORMAL LOW (ref 0.7–4.0)
MCHC: 31.1 g/dL (ref 30.0–36.0)
MCV: 78.6 fl (ref 78.0–100.0)
MONOS PCT: 12.2 % — AB (ref 3.0–12.0)
Monocytes Absolute: 0.6 10*3/uL (ref 0.1–1.0)
Neutro Abs: 3.4 10*3/uL (ref 1.4–7.7)
Neutrophils Relative %: 74.7 % (ref 43.0–77.0)
Platelets: 228 10*3/uL (ref 150.0–400.0)
RBC: 3.63 Mil/uL — AB (ref 4.22–5.81)
RDW: 19.1 % — ABNORMAL HIGH (ref 11.5–15.5)
WBC: 4.6 10*3/uL (ref 4.0–10.5)

## 2017-01-27 NOTE — Telephone Encounter (Signed)
That is fine. Lab order entered

## 2017-01-27 NOTE — Telephone Encounter (Signed)
Pt completed CBC w/diff.  Will now close note.

## 2017-01-28 DIAGNOSIS — F0390 Unspecified dementia without behavioral disturbance: Secondary | ICD-10-CM | POA: Diagnosis not present

## 2017-01-28 DIAGNOSIS — I110 Hypertensive heart disease with heart failure: Secondary | ICD-10-CM | POA: Diagnosis not present

## 2017-01-28 DIAGNOSIS — I4891 Unspecified atrial fibrillation: Secondary | ICD-10-CM | POA: Diagnosis not present

## 2017-01-28 DIAGNOSIS — E1140 Type 2 diabetes mellitus with diabetic neuropathy, unspecified: Secondary | ICD-10-CM | POA: Diagnosis not present

## 2017-01-28 DIAGNOSIS — I509 Heart failure, unspecified: Secondary | ICD-10-CM | POA: Diagnosis not present

## 2017-01-28 DIAGNOSIS — S51011D Laceration without foreign body of right elbow, subsequent encounter: Secondary | ICD-10-CM | POA: Diagnosis not present

## 2017-01-31 ENCOUNTER — Ambulatory Visit (INDEPENDENT_AMBULATORY_CARE_PROVIDER_SITE_OTHER): Payer: Medicare Other | Admitting: Internal Medicine

## 2017-01-31 ENCOUNTER — Other Ambulatory Visit: Payer: Self-pay | Admitting: *Deleted

## 2017-01-31 ENCOUNTER — Encounter: Payer: Self-pay | Admitting: Internal Medicine

## 2017-01-31 VITALS — BP 122/62 | HR 74 | Ht 73.0 in

## 2017-01-31 DIAGNOSIS — S51011D Laceration without foreign body of right elbow, subsequent encounter: Secondary | ICD-10-CM | POA: Diagnosis not present

## 2017-01-31 DIAGNOSIS — I482 Chronic atrial fibrillation: Secondary | ICD-10-CM | POA: Diagnosis not present

## 2017-01-31 DIAGNOSIS — R5383 Other fatigue: Secondary | ICD-10-CM

## 2017-01-31 DIAGNOSIS — E1140 Type 2 diabetes mellitus with diabetic neuropathy, unspecified: Secondary | ICD-10-CM | POA: Diagnosis not present

## 2017-01-31 DIAGNOSIS — I509 Heart failure, unspecified: Secondary | ICD-10-CM | POA: Diagnosis not present

## 2017-01-31 DIAGNOSIS — I4891 Unspecified atrial fibrillation: Secondary | ICD-10-CM | POA: Diagnosis not present

## 2017-01-31 DIAGNOSIS — I4821 Permanent atrial fibrillation: Secondary | ICD-10-CM

## 2017-01-31 DIAGNOSIS — I5032 Chronic diastolic (congestive) heart failure: Secondary | ICD-10-CM | POA: Diagnosis not present

## 2017-01-31 DIAGNOSIS — I110 Hypertensive heart disease with heart failure: Secondary | ICD-10-CM | POA: Diagnosis not present

## 2017-01-31 DIAGNOSIS — F0390 Unspecified dementia without behavioral disturbance: Secondary | ICD-10-CM | POA: Diagnosis not present

## 2017-01-31 MED ORDER — MEMANTINE HCL-DONEPEZIL HCL ER 28-10 MG PO CP24: 1.0000 | ORAL_CAPSULE | ORAL | 1 refills | Status: DC

## 2017-01-31 NOTE — Patient Instructions (Signed)
Medication Instructions:  Your physician recommends that you continue on your current medications as directed. Please refer to the Current Medication list given to you today.   Labwork: None   Testing/Procedures: None   Follow-Up: You are scheduled to see Dr End Thursday May 05, 2017 at 1:20 PM.       If you need a refill on your cardiac medications before your next appointment, please call your pharmacy.

## 2017-01-31 NOTE — Progress Notes (Signed)
Follow-up Outpatient Visit Date: 01/31/2017  Primary Care Provider: [name], NP [address] [place] [address]  Chief Complaint: Fatigue  HPI:  [name] is a 83 y.o. year-old male with history of heart failure with preserved ejection fraction, permanent atrial fibrillation not on anticoagulation secondary to recurrent GI bleeds, remote pleural and pericardial effusion status post pericardiocentesis, bladder cancer, diabetes mellitus, GERD, and dementia, who presents for follow-up of chronic diastolic heart failure. I last saw him on 12/23/16, at which time he continued to have some leg edema. We increased his furosemide for a few days and then had his family titrate.  Today, [name] is without complaints (though he provides very limited history due to his advanced dementia). His son feels also though he has been doing well for the last few weeks, though his wife is concerned that he has not been acting like himself. He fell and injured his elbow last month. EMS initially bandaged the wound, but [name] ultimately need a ED visit at [facility] for further asessement of the wound. It is gradually improving and was redressed by a home health nurse today. Multiple recent labs (including within the last week) have shown stable renal function and hemoglobin. [name] is concerned that her husband has been urinating a lot, though it is difficult to know if this is any different than his baseline. His weight at home has been stable. He has been getting furosemide as prescribed (80 mg QAM and 40 mg QPM on M,W,F). --------------------------------------------------------------------------------------------------  Cardiovascular History & Procedures: Cardiovascular Problems:  HFpEF  Permanent atrial fibrillation  Risk Factors:  Hypertension, diabetes mellitus, male gender, and age > 90  Cath/PCI:  LHC (08/06/01): LMCA normal. LAD with mild luminal irregularities. Dominant  LCx with mild luminal irregularities involving OM branches. Small RCA with mild luminal irregularities. LVEF 60-65%. LVEDP 11 mmHg.  CV Surgery:  Pericardiocentesis (01/10/15)  EP Procedures and Devices:  None  Non-Invasive Evaluation(s):  TTE (08/20/16): Normal LV size with LVEF 45-50%. Mild AI. MAC with mild RM. Severe LA enlargement. Moderately dilated RV with mildly reduced RV contraction. Severe RA enlargement. Mild PH (RVSP 39 mmHg). Elevated CVP.  Pharmacologic MPI (01/23/17): No significant ischemia or scar. LV appears dilated. LVEF not calculated.  Past medical and surgical history were reviewed and updated in EPIC.  Current Meds  Medication Sig  . FERREX 150 150 MG capsule TAKE (1) CAPSULE TWICE DAILY.  . folic acid (FOLVITE) 1 MG tablet Take 1 mg by mouth 2 (two) times daily.   . furosemide (LASIX) 40 MG tablet Take 80 mg daily in the AM except on M-W-F take 80 mg in the AM and 40 mg in the PM.  . haloperidol (HALDOL) 0.5 MG tablet Take one tablet daily as needed.  . hydrOXYzine (ATARAX/VISTARIL) 10 MG tablet TAKE (1) TABLET TWICE A DAY AS NEEDED.  [name] iron polysaccharides (NIFEREX) 150 MG capsule Take 1 capsule (150 mg total) by mouth 2 (two) times daily.  [name] LORazepam (ATIVAN) 0.5 MG tablet TAKE (1) TABLET TWICE A DAY AS NEEDED.  . Melatonin 3 MG CAPS Take by mouth. Takes nightly  . metoprolol tartrate (LOPRESSOR) 25 MG tablet Take 0.5 tablets (12.5 mg total) by mouth 2 (two) times daily.  . mirabegron ER (MYRBETRIQ) 50 MG TB24 tablet Take 1 tablet (50 mg total) by mouth daily.  . Multiple Vitamin (MULTIVITAMIN WITH MINERALS) TABS tablet Take 1 tablet by mouth daily.  . Multiple Vitamins-Minerals (PRESERVISION AREDS 2 PO)  Take 1 tablet by mouth 2 (two) times daily.   . nitroGLYCERIN (NITROSTAT) 0.4 MG SL tablet Place 1 tablet (0.4 mg total) under the tongue every 5 (five) minutes as needed for chest pain (x 3 doses daily).  . pantoprazole (PROTONIX) 40 MG tablet TAKE 1  TABLET TWICE DAILY.  [name] QUEtiapine (SEROQUEL) 25 MG tablet Take 1 tablet (25 mg total) by mouth at bedtime as needed.  . [DISCONTINUED] Memantine HCl-Donepezil HCl (NAMZARIC) 28-10 MG CP24 Take 1 tablet by mouth every morning.    Allergies: Cephalexin; Furacin [nitrofurazone]; Macrodantin; and Zestril [lisinopril]  Social History   Socioeconomic History  . Marital status: Married    Spouse name: [name]  . Number of children: 4  . Years of education: 10th   . Highest education level: Not on file  Social Needs  . Financial resource strain: Not on file  . Food insecurity - worry: Not on file  . Food insecurity - inability: Not on file  . Transportation needs - medical: Not on file  . Transportation needs - non-medical: Not on file  Occupational History  . Occupation: retired    Fish farm manager: RETIRED  Tobacco Use  . Smoking status: Former Smoker    Packs/day: 2.00    Years: 40.00    Pack years: 80.00    Types: Cigarettes    Last attempt to quit: 08/28/1975    Years since quitting: 41.4  . Smokeless tobacco: Former Systems developer    Types: Chew    Quit date: 1980  Substance and Sexual Activity  . Alcohol use: No  . Drug use: No  . Sexual activity: Not Currently  Other Topics Concern  . Not on file  Social History Narrative   Patient lives at home with his wife [name]) Married 57 years.   Retired   Education 10 th grade   Right handed   Caffeine one or two daily     Family History  Problem Relation Age of Onset  . Kidney disease Mother   . Heart disease Mother   . Stroke Mother   . Cancer Father        prostate  . Mental illness Brother   . Diabetes Neg Hx   . COPD Neg Hx   . Heart attack Neg Hx   . Hypertension Neg Hx     Review of Systems: Unable to perform due to the patient's advanced dementia.  --------------------------------------------------------------------------------------------------  Physical Exam: BP 122/62   Pulse 74   Ht 6' 1"  (1.854 m)   SpO2 95%    BMI 23.75 kg/m   General:  Frail, elderly man seated in a wheelchair. He is accompanied by his wife and son. HEENT: No conjunctival pallor or scleral icterus. Moist mucous membranes.  OP clear. Neck: Supple without lymphadenopathy, thyromegaly, JVD, or HJR. Lungs: Normal work of breathing. Moderately diminished breath sounds throughout with poor inspiratory effort. No wheezes or crackles. Heart: Irregularly irregular without murmurs, rubs, or gallops. Non-displaced PMI. Abd: Bowel sounds present. Soft, NT/ND without hepatosplenomegaly Ext: 1+calf edema bilaterally. Skin: Warm and dry. Elbow wound dressed and not examined today.   Lab Results  Component Value Date   WBC 4.6 01/27/2017   HGB 8.9 Repeated and verified X2. (L) 01/27/2017   HCT 28.5 (L) 01/27/2017   MCV 78.6 01/27/2017   PLT 228.0 01/27/2017    Lab Results  Component Value Date   NA 137 01/24/2017   K 4.2 01/24/2017   CL 100 01/24/2017   CO2 29  01/24/2017   BUN 16 01/24/2017   CREATININE 0.91 01/24/2017   GLUCOSE 106 (H) 01/24/2017   ALT 15 (L) 10/28/2016    Lab Results  Component Value Date   CHOL 125 03/20/2015   HDL 48 03/20/2015   LDLCALC 64 03/20/2015   TRIG 66 03/20/2015   CHOLHDL 2.6 03/20/2015    --------------------------------------------------------------------------------------------------  ASSESSMENT AND PLAN: Chronic diastolic heart failure Home weights have been stable per [name]' family's report. Chronic leg edema appears stable from our prior visit. We will continue [name]' current furosemide regimen, with addition afternoon doses if he gains >2 pounds in a day or 5 pounds in a week.  Fatigue This has been a chronic problem for the patient and is likely multifactorial. He remains anemic, though CBC 4 days showed stable to slightly improved hemoglobin. Given increased urinary frequency, UTI is certainly a concern. We are unsure if [name] will be able to provide [place] with a urine  sample today. We have provided his family with a specimen cup in order to collect a clean sample. This can be taken to his PCP's office or to our clinic, to be sent for urinalysis and reflex culture.  Permanent atrial fibrillation Heart rate adequately controlled. We will continue to avoid anticoagulation given chronic anemia with history of GI bleeds and recurrent falls.  Follow-up: Return to clinic in 3 months.  Nelva Bush, MD 01/31/2017 8:37 PM

## 2017-02-01 ENCOUNTER — Telehealth: Payer: Self-pay | Admitting: Adult Health

## 2017-02-01 ENCOUNTER — Telehealth: Payer: Self-pay

## 2017-02-01 DIAGNOSIS — F0390 Unspecified dementia without behavioral disturbance: Secondary | ICD-10-CM | POA: Diagnosis not present

## 2017-02-01 DIAGNOSIS — I110 Hypertensive heart disease with heart failure: Secondary | ICD-10-CM | POA: Diagnosis not present

## 2017-02-01 DIAGNOSIS — F0391 Unspecified dementia with behavioral disturbance: Secondary | ICD-10-CM

## 2017-02-01 DIAGNOSIS — I4891 Unspecified atrial fibrillation: Secondary | ICD-10-CM | POA: Diagnosis not present

## 2017-02-01 DIAGNOSIS — I509 Heart failure, unspecified: Secondary | ICD-10-CM | POA: Diagnosis not present

## 2017-02-01 DIAGNOSIS — Z515 Encounter for palliative care: Secondary | ICD-10-CM

## 2017-02-01 DIAGNOSIS — E1140 Type 2 diabetes mellitus with diabetic neuropathy, unspecified: Secondary | ICD-10-CM | POA: Diagnosis not present

## 2017-02-01 DIAGNOSIS — S51011D Laceration without foreign body of right elbow, subsequent encounter: Secondary | ICD-10-CM | POA: Diagnosis not present

## 2017-02-01 NOTE — Telephone Encounter (Signed)
Home health needs to be notified to proceed with orders per [name], AGNP-C.  Order for bedside commode placed in Epic.  Left a message for a return call from home health.

## 2017-02-01 NOTE — Telephone Encounter (Signed)
[place] for verbal orders and prescription for bedside commode

## 2017-02-01 NOTE — Telephone Encounter (Signed)
Copied from [place] [id number]. 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 CB#: [phone]. May leave detailed message with orders. And also needs an rx for bedside commode sent to [facility]. Fax: [phone]. Also with the rx send the facesheet.

## 2017-02-01 NOTE — Telephone Encounter (Signed)
[place] with [facility] care needs verbal for OT  1 wk / 1 2 wk /4   ALSO pt needs rx for bedside commode and faxed to [facility] [phone]  Needs demo sheet w/ insurance infor faxed to them

## 2017-02-02 DIAGNOSIS — F0390 Unspecified dementia without behavioral disturbance: Secondary | ICD-10-CM | POA: Diagnosis not present

## 2017-02-02 DIAGNOSIS — S51011D Laceration without foreign body of right elbow, subsequent encounter: Secondary | ICD-10-CM | POA: Diagnosis not present

## 2017-02-02 DIAGNOSIS — I110 Hypertensive heart disease with heart failure: Secondary | ICD-10-CM | POA: Diagnosis not present

## 2017-02-02 DIAGNOSIS — I509 Heart failure, unspecified: Secondary | ICD-10-CM | POA: Diagnosis not present

## 2017-02-02 DIAGNOSIS — I4891 Unspecified atrial fibrillation: Secondary | ICD-10-CM | POA: Diagnosis not present

## 2017-02-02 DIAGNOSIS — E1140 Type 2 diabetes mellitus with diabetic neuropathy, unspecified: Secondary | ICD-10-CM | POA: Diagnosis not present

## 2017-02-03 DIAGNOSIS — I509 Heart failure, unspecified: Secondary | ICD-10-CM | POA: Diagnosis not present

## 2017-02-03 DIAGNOSIS — E1140 Type 2 diabetes mellitus with diabetic neuropathy, unspecified: Secondary | ICD-10-CM | POA: Diagnosis not present

## 2017-02-03 DIAGNOSIS — I110 Hypertensive heart disease with heart failure: Secondary | ICD-10-CM | POA: Diagnosis not present

## 2017-02-03 DIAGNOSIS — I4891 Unspecified atrial fibrillation: Secondary | ICD-10-CM | POA: Diagnosis not present

## 2017-02-03 DIAGNOSIS — S51011D Laceration without foreign body of right elbow, subsequent encounter: Secondary | ICD-10-CM | POA: Diagnosis not present

## 2017-02-03 DIAGNOSIS — F0390 Unspecified dementia without behavioral disturbance: Secondary | ICD-10-CM | POA: Diagnosis not present

## 2017-02-03 NOTE — Telephone Encounter (Signed)
See other telephone call

## 2017-02-03 NOTE — Telephone Encounter (Signed)
[facility] cannot access EPIC for order of bedside commode. Please fax with demo sheet and insurance information to [phone]

## 2017-02-03 NOTE — Telephone Encounter (Signed)
Detailed message left to proceed with orders.  Asked for a call back if any questions.

## 2017-02-04 DIAGNOSIS — F0390 Unspecified dementia without behavioral disturbance: Secondary | ICD-10-CM | POA: Diagnosis not present

## 2017-02-04 DIAGNOSIS — I110 Hypertensive heart disease with heart failure: Secondary | ICD-10-CM | POA: Diagnosis not present

## 2017-02-04 DIAGNOSIS — I509 Heart failure, unspecified: Secondary | ICD-10-CM | POA: Diagnosis not present

## 2017-02-04 DIAGNOSIS — E1140 Type 2 diabetes mellitus with diabetic neuropathy, unspecified: Secondary | ICD-10-CM | POA: Diagnosis not present

## 2017-02-04 DIAGNOSIS — I4891 Unspecified atrial fibrillation: Secondary | ICD-10-CM | POA: Diagnosis not present

## 2017-02-04 DIAGNOSIS — S51011D Laceration without foreign body of right elbow, subsequent encounter: Secondary | ICD-10-CM | POA: Diagnosis not present

## 2017-02-07 ENCOUNTER — Encounter: Payer: Self-pay | Admitting: Family Medicine

## 2017-02-07 ENCOUNTER — Ambulatory Visit (INDEPENDENT_AMBULATORY_CARE_PROVIDER_SITE_OTHER): Payer: Medicare Other | Admitting: Family Medicine

## 2017-02-07 VITALS — BP 110/70 | HR 72 | Temp 97.6°F | Wt 184.2 lb

## 2017-02-07 DIAGNOSIS — I110 Hypertensive heart disease with heart failure: Secondary | ICD-10-CM | POA: Diagnosis not present

## 2017-02-07 DIAGNOSIS — I509 Heart failure, unspecified: Secondary | ICD-10-CM | POA: Diagnosis not present

## 2017-02-07 DIAGNOSIS — D649 Anemia, unspecified: Secondary | ICD-10-CM | POA: Diagnosis not present

## 2017-02-07 DIAGNOSIS — I4891 Unspecified atrial fibrillation: Secondary | ICD-10-CM | POA: Diagnosis not present

## 2017-02-07 DIAGNOSIS — R319 Hematuria, unspecified: Secondary | ICD-10-CM

## 2017-02-07 DIAGNOSIS — R600 Localized edema: Secondary | ICD-10-CM | POA: Diagnosis not present

## 2017-02-07 DIAGNOSIS — F0390 Unspecified dementia without behavioral disturbance: Secondary | ICD-10-CM | POA: Diagnosis not present

## 2017-02-07 DIAGNOSIS — E1140 Type 2 diabetes mellitus with diabetic neuropathy, unspecified: Secondary | ICD-10-CM | POA: Diagnosis not present

## 2017-02-07 DIAGNOSIS — S51011D Laceration without foreign body of right elbow, subsequent encounter: Secondary | ICD-10-CM | POA: Diagnosis not present

## 2017-02-07 LAB — POC URINALSYSI DIPSTICK (AUTOMATED)
Bilirubin, UA: NEGATIVE
GLUCOSE UA: NEGATIVE
Ketones, UA: NEGATIVE
Leukocytes, UA: NEGATIVE
NITRITE UA: NEGATIVE
PH UA: 7.5 (ref 5.0–8.0)
Protein, UA: NEGATIVE
SPEC GRAV UA: 1.015 (ref 1.010–1.025)
UROBILINOGEN UA: 0.2 U/dL

## 2017-02-07 MED ORDER — FOSFOMYCIN TROMETHAMINE 3 G PO PACK: 3.0000 g | PACK | Freq: Once | ORAL | 0 refills | Status: AC

## 2017-02-07 NOTE — Progress Notes (Signed)
Subjective:    Patient ID: [name], male    DOB: [birth year], 83 y.o.   MRN: [id number]  No chief complaint on file.   HPI Patient was seen today for increased confusion and concern for UTI per family.  Pt is accompanied by two of his sons [name] and Gerald Stabs?).  Per sons pt is not speaking, has no energy, and is having more behavioral problems.  Per family pt is not able to get up from wheelchair this wk.  Last wk pt refused to take any of his meds.  Pt is more combative and less of the nice person they know.  Pt denies having any pain, dysuria, increased urination, or increased edema.  Per family pt has a h/o anemia.  Pt was having hgb checked every 2 wks.  Family interested in rechecking today.  Past Medical History:  Diagnosis Date  . Anemia    iron deficiency secondary to bleeding  . Arrhythmia    a fib  . Arthritis    "all over" (10/28/2016)  . Atrial fibrillation ([place])   . Bladder cancer (HCC)    BCG [name]  . CHF (congestive heart failure) ([place])   . Dementia    "he's had it for a number of years" (10/28/2016)  . ED (erectile dysfunction)   . Genital warts    recurrent. Not a problem  . GERD (gastroesophageal reflux disease)    treated with PPI  . Hard of hearing   . Hematochezia    has intermittent problem  . History of blood transfusion    "related to low HgB" (10/28/2016)  . History of hiatal hernia    "years ago" (10/28/2016)  . Hypertension    mild  . Irritable bladder   . Memory disorder   . Neuropathy    burning discomfort feet/legs  . On home oxygen therapy    "2L at night" (10/28/2016)  . Pneumonia    "twice" (10/28/2016)  . Restless leg    treats with heat  . Type II diabetes mellitus (HCC)    NIDDM on oral medication    Allergies  Allergen Reactions  . Cephalexin Other (See Comments)    Unknown reaction  . Furacin [Nitrofurazone] Other (See Comments)    Unknown reaction  . Macrodantin Other (See Comments)    Unknown reaction  . Zestril  [Lisinopril] Other (See Comments)    Unknown reaction    ROS General: Denies fever, chills, night sweats, changes in weight, changes in appetite   +lethargy HEENT: Denies headaches, ear pain, changes in vision, rhinorrhea, sore throat CV: Denies CP, palpitations, SOB, orthopnea Pulm: Denies SOB, cough, wheezing GI: Denies abdominal pain, nausea, vomiting, diarrhea, constipation GU: Denies dysuria, hematuria, frequency, vaginal discharge Msk: Denies muscle cramps, joint pains Neuro: Denies weakness, numbness, tingling Skin: Denies rashes, bruising Psych: Denies depression, anxiety, hallucinations       Objective:    Blood pressure 110/70, pulse 72, temperature 97.6 F (36.4 C), temperature source Oral, weight 184 lb 3.2 oz (83.6 kg).   Gen. Pleasant, well-nourished, in no distress, normal affect.  Quiet, sitting in wheelchair.  Answers questions when asked directly. HEENT: [place]/AT, face symmetric, no scleral icterus, PERRLA, nares patent without drainage. Lungs: no accessory muscle use, CTAB, no wheezes or rales Cardiovascular: RRR, no m/r/g, 1+ pitting edema in R ankle, no edema in LLE. Abdomen: BS present, soft, NT/ND, no suprapubic tenderness Neuro:  Alert, Oriented to person, place, answers questions   Wt Readings from  Last 3 Encounters:  02/07/17 184 lb 3.2 oz (83.6 kg)  12/15/16 180 lb (81.6 kg)  11/02/16 186 lb (84.4 kg)    Lab Results  Component Value Date   WBC 4.6 01/27/2017   HGB 8.9 Repeated and verified X2. (L) 01/27/2017   HCT 28.5 (L) 01/27/2017   PLT 228.0 01/27/2017   GLUCOSE 106 (H) 01/24/2017   CHOL 125 03/20/2015   TRIG 66 03/20/2015   HDL 48 03/20/2015   LDLCALC 64 03/20/2015   ALT 15 (L) 10/28/2016   AST 19 10/28/2016   NA 137 01/24/2017   K 4.2 01/24/2017   CL 100 01/24/2017   CREATININE 0.91 01/24/2017   BUN 16 01/24/2017   CO2 29 01/24/2017   TSH 2.71 02/18/2016   INR 1.46 01/12/2015   HGBA1C 4.5 (L) 07/16/2014     Assessment/Plan:  Hematuria, unspecified type -UA with blood -Will send for cx -family would like to proceed with UTI treatment.  Given allergy hx will do fosfomycin 1 dose while awaiting cx.  - Plan: POCT Urinalysis Dipstick (Automated), Urine Culture, CBC with Differential/Platelet, fosfomycin (MONUROL) 3 g PACK  Anemia, unspecified type  - Plan: CBC with Differential/Platelet  Localized edema  -R ankle >L - Plan: Brain Natriuretic Peptide   F/u with pcp next wk.  Discuss work up for hematuria.

## 2017-02-07 NOTE — Patient Instructions (Addendum)

## 2017-02-07 NOTE — Telephone Encounter (Signed)
Order for bedside commode faxed in.

## 2017-02-08 DIAGNOSIS — I509 Heart failure, unspecified: Secondary | ICD-10-CM | POA: Diagnosis not present

## 2017-02-08 DIAGNOSIS — I110 Hypertensive heart disease with heart failure: Secondary | ICD-10-CM | POA: Diagnosis not present

## 2017-02-08 DIAGNOSIS — S51011D Laceration without foreign body of right elbow, subsequent encounter: Secondary | ICD-10-CM | POA: Diagnosis not present

## 2017-02-08 DIAGNOSIS — F0390 Unspecified dementia without behavioral disturbance: Secondary | ICD-10-CM | POA: Diagnosis not present

## 2017-02-08 DIAGNOSIS — E1140 Type 2 diabetes mellitus with diabetic neuropathy, unspecified: Secondary | ICD-10-CM | POA: Diagnosis not present

## 2017-02-08 DIAGNOSIS — I4891 Unspecified atrial fibrillation: Secondary | ICD-10-CM | POA: Diagnosis not present

## 2017-02-08 LAB — URINE CULTURE
MICRO NUMBER:: 81302444
RESULT: NO GROWTH
SPECIMEN QUALITY:: ADEQUATE

## 2017-02-08 LAB — CBC WITH DIFFERENTIAL/PLATELET
BASOS ABS: 0 10*3/uL (ref 0.0–0.1)
Basophils Relative: 0.5 % (ref 0.0–3.0)
EOS ABS: 0.1 10*3/uL (ref 0.0–0.7)
Eosinophils Relative: 3.5 % (ref 0.0–5.0)
HCT: 29.8 % — ABNORMAL LOW (ref 39.0–52.0)
Hemoglobin: 9.2 g/dL — ABNORMAL LOW (ref 13.0–17.0)
LYMPHS ABS: 0.4 10*3/uL — AB (ref 0.7–4.0)
Lymphocytes Relative: 13.5 % (ref 12.0–46.0)
MCHC: 30.9 g/dL (ref 30.0–36.0)
MCV: 78.3 fl (ref 78.0–100.0)
MONO ABS: 0.5 10*3/uL (ref 0.1–1.0)
Monocytes Relative: 15.1 % — ABNORMAL HIGH (ref 3.0–12.0)
NEUTROS ABS: 2.2 10*3/uL (ref 1.4–7.7)
NEUTROS PCT: 67.4 % (ref 43.0–77.0)
PLATELETS: 198 10*3/uL (ref 150.0–400.0)
RBC: 3.8 Mil/uL — ABNORMAL LOW (ref 4.22–5.81)
RDW: 19.2 % — ABNORMAL HIGH (ref 11.5–15.5)
WBC: 3.2 10*3/uL — ABNORMAL LOW (ref 4.0–10.5)

## 2017-02-09 ENCOUNTER — Encounter: Payer: Self-pay | Admitting: Family Medicine

## 2017-02-09 DIAGNOSIS — I4891 Unspecified atrial fibrillation: Secondary | ICD-10-CM | POA: Diagnosis not present

## 2017-02-09 DIAGNOSIS — I110 Hypertensive heart disease with heart failure: Secondary | ICD-10-CM | POA: Diagnosis not present

## 2017-02-09 DIAGNOSIS — I509 Heart failure, unspecified: Secondary | ICD-10-CM | POA: Diagnosis not present

## 2017-02-09 DIAGNOSIS — E1140 Type 2 diabetes mellitus with diabetic neuropathy, unspecified: Secondary | ICD-10-CM | POA: Diagnosis not present

## 2017-02-09 DIAGNOSIS — F0390 Unspecified dementia without behavioral disturbance: Secondary | ICD-10-CM | POA: Diagnosis not present

## 2017-02-09 DIAGNOSIS — S51011D Laceration without foreign body of right elbow, subsequent encounter: Secondary | ICD-10-CM | POA: Diagnosis not present

## 2017-02-09 LAB — BRAIN NATRIURETIC PEPTIDE: PRO B NATRI PEPTIDE: 173 pg/mL — AB (ref 0.0–100.0)

## 2017-02-09 NOTE — Telephone Encounter (Signed)
Spoke with patient wife about lab results. Nothing further needed

## 2017-02-11 DIAGNOSIS — I4891 Unspecified atrial fibrillation: Secondary | ICD-10-CM | POA: Diagnosis not present

## 2017-02-11 DIAGNOSIS — F0390 Unspecified dementia without behavioral disturbance: Secondary | ICD-10-CM | POA: Diagnosis not present

## 2017-02-11 DIAGNOSIS — I110 Hypertensive heart disease with heart failure: Secondary | ICD-10-CM | POA: Diagnosis not present

## 2017-02-11 DIAGNOSIS — I509 Heart failure, unspecified: Secondary | ICD-10-CM | POA: Diagnosis not present

## 2017-02-11 DIAGNOSIS — E1140 Type 2 diabetes mellitus with diabetic neuropathy, unspecified: Secondary | ICD-10-CM | POA: Diagnosis not present

## 2017-02-11 DIAGNOSIS — S51011D Laceration without foreign body of right elbow, subsequent encounter: Secondary | ICD-10-CM | POA: Diagnosis not present

## 2017-02-15 DIAGNOSIS — I509 Heart failure, unspecified: Secondary | ICD-10-CM | POA: Diagnosis not present

## 2017-02-15 DIAGNOSIS — E1140 Type 2 diabetes mellitus with diabetic neuropathy, unspecified: Secondary | ICD-10-CM | POA: Diagnosis not present

## 2017-02-15 DIAGNOSIS — F0390 Unspecified dementia without behavioral disturbance: Secondary | ICD-10-CM | POA: Diagnosis not present

## 2017-02-15 DIAGNOSIS — I110 Hypertensive heart disease with heart failure: Secondary | ICD-10-CM | POA: Diagnosis not present

## 2017-02-15 DIAGNOSIS — S51011D Laceration without foreign body of right elbow, subsequent encounter: Secondary | ICD-10-CM | POA: Diagnosis not present

## 2017-02-15 DIAGNOSIS — I4891 Unspecified atrial fibrillation: Secondary | ICD-10-CM | POA: Diagnosis not present

## 2017-02-16 DIAGNOSIS — I4891 Unspecified atrial fibrillation: Secondary | ICD-10-CM | POA: Diagnosis not present

## 2017-02-16 DIAGNOSIS — I110 Hypertensive heart disease with heart failure: Secondary | ICD-10-CM | POA: Diagnosis not present

## 2017-02-16 DIAGNOSIS — S51011D Laceration without foreign body of right elbow, subsequent encounter: Secondary | ICD-10-CM | POA: Diagnosis not present

## 2017-02-16 DIAGNOSIS — F0390 Unspecified dementia without behavioral disturbance: Secondary | ICD-10-CM | POA: Diagnosis not present

## 2017-02-16 DIAGNOSIS — E1140 Type 2 diabetes mellitus with diabetic neuropathy, unspecified: Secondary | ICD-10-CM | POA: Diagnosis not present

## 2017-02-16 DIAGNOSIS — I509 Heart failure, unspecified: Secondary | ICD-10-CM | POA: Diagnosis not present

## 2017-02-17 DIAGNOSIS — I509 Heart failure, unspecified: Secondary | ICD-10-CM | POA: Diagnosis not present

## 2017-02-17 DIAGNOSIS — S51011D Laceration without foreign body of right elbow, subsequent encounter: Secondary | ICD-10-CM | POA: Diagnosis not present

## 2017-02-17 DIAGNOSIS — I110 Hypertensive heart disease with heart failure: Secondary | ICD-10-CM | POA: Diagnosis not present

## 2017-02-17 DIAGNOSIS — E1140 Type 2 diabetes mellitus with diabetic neuropathy, unspecified: Secondary | ICD-10-CM | POA: Diagnosis not present

## 2017-02-17 DIAGNOSIS — F0390 Unspecified dementia without behavioral disturbance: Secondary | ICD-10-CM | POA: Diagnosis not present

## 2017-02-17 DIAGNOSIS — I4891 Unspecified atrial fibrillation: Secondary | ICD-10-CM | POA: Diagnosis not present

## 2017-02-18 DIAGNOSIS — F0390 Unspecified dementia without behavioral disturbance: Secondary | ICD-10-CM | POA: Diagnosis not present

## 2017-02-18 DIAGNOSIS — I4891 Unspecified atrial fibrillation: Secondary | ICD-10-CM | POA: Diagnosis not present

## 2017-02-18 DIAGNOSIS — E1140 Type 2 diabetes mellitus with diabetic neuropathy, unspecified: Secondary | ICD-10-CM | POA: Diagnosis not present

## 2017-02-18 DIAGNOSIS — I509 Heart failure, unspecified: Secondary | ICD-10-CM | POA: Diagnosis not present

## 2017-02-18 DIAGNOSIS — I110 Hypertensive heart disease with heart failure: Secondary | ICD-10-CM | POA: Diagnosis not present

## 2017-02-18 DIAGNOSIS — S51011D Laceration without foreign body of right elbow, subsequent encounter: Secondary | ICD-10-CM | POA: Diagnosis not present

## 2017-02-21 ENCOUNTER — Encounter: Payer: Self-pay | Admitting: Adult Health

## 2017-02-22 ENCOUNTER — Other Ambulatory Visit: Payer: Self-pay | Admitting: Adult Health

## 2017-02-22 DIAGNOSIS — E1140 Type 2 diabetes mellitus with diabetic neuropathy, unspecified: Secondary | ICD-10-CM | POA: Diagnosis not present

## 2017-02-22 DIAGNOSIS — I110 Hypertensive heart disease with heart failure: Secondary | ICD-10-CM | POA: Diagnosis not present

## 2017-02-22 DIAGNOSIS — S51011D Laceration without foreign body of right elbow, subsequent encounter: Secondary | ICD-10-CM | POA: Diagnosis not present

## 2017-02-22 DIAGNOSIS — I4891 Unspecified atrial fibrillation: Secondary | ICD-10-CM | POA: Diagnosis not present

## 2017-02-22 DIAGNOSIS — F0390 Unspecified dementia without behavioral disturbance: Secondary | ICD-10-CM | POA: Diagnosis not present

## 2017-02-22 DIAGNOSIS — I509 Heart failure, unspecified: Secondary | ICD-10-CM | POA: Diagnosis not present

## 2017-02-23 NOTE — Telephone Encounter (Signed)
Ok to refill for 30 days  

## 2017-02-23 NOTE — Telephone Encounter (Signed)
SENT TO THE PHARMACY BY E-SCRIBE. 

## 2017-02-24 ENCOUNTER — Ambulatory Visit (INDEPENDENT_AMBULATORY_CARE_PROVIDER_SITE_OTHER): Payer: Medicare Other | Admitting: Adult Health

## 2017-02-24 ENCOUNTER — Encounter: Payer: Self-pay | Admitting: Adult Health

## 2017-02-24 VITALS — BP 110/58 | HR 76 | Temp 97.7°F | Wt 191.0 lb

## 2017-02-24 DIAGNOSIS — I110 Hypertensive heart disease with heart failure: Secondary | ICD-10-CM | POA: Diagnosis not present

## 2017-02-24 DIAGNOSIS — G301 Alzheimer's disease with late onset: Secondary | ICD-10-CM | POA: Diagnosis not present

## 2017-02-24 DIAGNOSIS — F02818 Dementia in other diseases classified elsewhere, unspecified severity, with other behavioral disturbance: Secondary | ICD-10-CM

## 2017-02-24 DIAGNOSIS — F0390 Unspecified dementia without behavioral disturbance: Secondary | ICD-10-CM | POA: Diagnosis not present

## 2017-02-24 DIAGNOSIS — S51011D Laceration without foreign body of right elbow, subsequent encounter: Secondary | ICD-10-CM | POA: Diagnosis not present

## 2017-02-24 DIAGNOSIS — F0281 Dementia in other diseases classified elsewhere with behavioral disturbance: Secondary | ICD-10-CM

## 2017-02-24 DIAGNOSIS — E1140 Type 2 diabetes mellitus with diabetic neuropathy, unspecified: Secondary | ICD-10-CM | POA: Diagnosis not present

## 2017-02-24 DIAGNOSIS — I509 Heart failure, unspecified: Secondary | ICD-10-CM | POA: Diagnosis not present

## 2017-02-24 DIAGNOSIS — I4891 Unspecified atrial fibrillation: Secondary | ICD-10-CM | POA: Diagnosis not present

## 2017-02-24 LAB — POC URINALSYSI DIPSTICK (AUTOMATED)
BILIRUBIN UA: NEGATIVE
GLUCOSE UA: NEGATIVE
KETONES UA: NEGATIVE
Leukocytes, UA: NEGATIVE
Nitrite, UA: NEGATIVE
Protein, UA: NEGATIVE
RBC UA: NEGATIVE
SPEC GRAV UA: 1.015 (ref 1.010–1.025)
Urobilinogen, UA: 0.2 E.U./dL
pH, UA: 8 (ref 5.0–8.0)

## 2017-02-24 NOTE — Progress Notes (Signed)
Subjective:    Patient ID: [name], male    DOB: 1928-02-28, 83 y.o.   MRN: [id number]  HPI  83 year old male who  has a past medical history of Anemia, Arrhythmia, Arthritis, Atrial fibrillation ([place]), Bladder cancer ([place]), CHF (congestive heart failure) ([place]), Dementia, ED (erectile dysfunction), Genital warts, GERD (gastroesophageal reflux disease), Hard of hearing, Hematochezia, History of blood transfusion, History of hiatal hernia, Hypertension, Irritable bladder, Memory disorder, Neuropathy, On home oxygen therapy, Pneumonia, Restless leg, and Type II diabetes mellitus ([place]).   He presents to the office today with his son for increased agitation.  I received a note, earlier in the week from the patient's daughter who was concerned because he having issues with walking and communicating.  He also seemed very agitated while at home.  Today the son reports that the patient has been having episodes of increased agitation throughout the day and especially at night as he is not able to sleep soundly.  He does report that the walking and communication seem to be improving as the week has progressed.  But that there continues to be concerned that he does not want to get up and walk and would rather sit in his chair  Her agitation they have had to give Haldol at multiple times this week.  Also taking Ativan twice daily as well as Seroquel nightly for agitation.  That last night they gave the patient 50 mg of Seroquel ( 25mg  is the prescribed dose as written per neurology) and he was able to sleep more soundly throughout the night  Review of Systems See HPI   Past Medical History:  Diagnosis Date  . Anemia    iron deficiency secondary to bleeding  . Arrhythmia    a fib  . Arthritis    "all over" (10/28/2016)  . Atrial fibrillation ([place])   . Bladder cancer (HCC)    [facility] [name]  . CHF (congestive heart failure) ([place])   . Dementia    "he's had it for a number of years" (10/28/2016)    . ED (erectile dysfunction)   . Genital warts    recurrent. Not a problem  . GERD (gastroesophageal reflux disease)    treated with PPI  . Hard of hearing   . Hematochezia    has intermittent problem  . History of blood transfusion    "related to low HgB" (10/28/2016)  . History of hiatal hernia    "years ago" (10/28/2016)  . Hypertension    mild  . Irritable bladder   . Memory disorder   . Neuropathy    burning discomfort feet/legs  . On home oxygen therapy    "2L at night" (10/28/2016)  . Pneumonia    "twice" (10/28/2016)  . Restless leg    treats with heat  . Type II diabetes mellitus (HCC)    NIDDM on oral medication    Social History   Socioeconomic History  . Marital status: Married    Spouse name: [name]  . Number of children: 4  . Years of education: 10th   . Highest education level: Not on file  Social Needs  . Financial resource strain: Not on file  . Food insecurity - worry: Not on file  . Food insecurity - inability: Not on file  . Transportation needs - medical: Not on file  . Transportation needs - non-medical: Not on file  Occupational History  . Occupation: retired    Fish farm manager: RETIRED  Tobacco  Use  . Smoking status: Former Smoker    Packs/day: 2.00    Years: 40.00    Pack years: 80.00    Types: Cigarettes    Last attempt to quit: 08/28/1975    Years since quitting: 41.5  . Smokeless tobacco: Former Systems developer    Types: Chew    Quit date: 1980  Substance and Sexual Activity  . Alcohol use: No  . Drug use: No  . Sexual activity: Not Currently  Other Topics Concern  . Not on file  Social History Narrative   Patient lives at home with his wife [name]) Married 25 years.   Retired   Education 10 th grade   Right handed   Caffeine one or two daily     Past Surgical History:  Procedure Laterality Date  . APPENDECTOMY  1946  . CARDIAC CATHETERIZATION N/A 01/13/2015   Procedure: Pericardiocentesis;  Surgeon: Leonie Man, MD;  Location: [place]  [facility];  Service: Cardiovascular;  Laterality: N/A;  . CARDIOVASCULAR STRESS TEST  03/28/2003   EF 55%.  Normal cardiolite study  . JOINT REPLACEMENT  1990's   left TKR  . KNEE ARTHROSCOPY Right 1980's  . TONSILLECTOMY  1938    Family History  Problem Relation Age of Onset  . Kidney disease Mother   . Heart disease Mother   . Stroke Mother   . Cancer Father        prostate  . Mental illness Brother   . Diabetes Neg Hx   . COPD Neg Hx   . Heart attack Neg Hx   . Hypertension Neg Hx     Allergies  Allergen Reactions  . Cephalexin Other (See Comments)    Unknown reaction  . Furacin [Nitrofurazone] Other (See Comments)    Unknown reaction  . Macrodantin Other (See Comments)    Unknown reaction  . Zestril [Lisinopril] Other (See Comments)    Unknown reaction    Current Outpatient Medications on File Prior to Visit  Medication Sig Dispense Refill  . FERREX 150 150 MG capsule TAKE (1) CAPSULE TWICE DAILY. 638 capsule 1  . folic acid (FOLVITE) 1 MG tablet Take 1 mg by mouth 2 (two) times daily.     . furosemide (LASIX) 40 MG tablet Take 80 mg daily in the AM except on M-W-F take 80 mg in the AM and 40 mg in the PM.    . haloperidol (HALDOL) 0.5 MG tablet TAKE (1) TABLET DAILY AS NEEDED. 30 tablet 0  . hydrOXYzine (ATARAX/VISTARIL) 10 MG tablet TAKE (1) TABLET TWICE A DAY AS NEEDED. 180 tablet 0  . iron polysaccharides (NIFEREX) 150 MG capsule Take 1 capsule (150 mg total) by mouth 2 (two) times daily. 180 capsule 1  . LORazepam (ATIVAN) 0.5 MG tablet TAKE (1) TABLET TWICE A DAY AS NEEDED. 60 tablet 2  . Melatonin 3 MG CAPS Take by mouth. Takes nightly    . Memantine HCl-Donepezil HCl (NAMZARIC) 28-10 MG CP24 Take 1 capsule every morning by mouth. 90 capsule 1  . metoprolol tartrate (LOPRESSOR) 25 MG tablet Take 0.5 tablets (12.5 mg total) by mouth 2 (two) times daily. 90 tablet 3  . mirabegron ER (MYRBETRIQ) 50 MG TB24 tablet Take 1 tablet (50 mg total) by mouth daily. 60 tablet  5  . Multiple Vitamin (MULTIVITAMIN WITH MINERALS) TABS tablet Take 1 tablet by mouth daily.    . Multiple Vitamins-Minerals (PRESERVISION AREDS 2 PO) Take 1 tablet by mouth 2 (two) times daily.     [name]  nitroGLYCERIN (NITROSTAT) 0.4 MG SL tablet Place 1 tablet (0.4 mg total) under the tongue every 5 (five) minutes as needed for chest pain (x 3 doses daily). 30 tablet 3  . pantoprazole (PROTONIX) 40 MG tablet TAKE 1 TABLET TWICE DAILY. 180 tablet 1  . QUEtiapine (SEROQUEL) 25 MG tablet Take 1 tablet (25 mg total) by mouth at bedtime as needed. 30 tablet 6   No current facility-administered medications on file prior to visit.     BP (!) 110/58 (BP Location: Left Arm, Patient Position: Sitting, Cuff Size: Normal)   Pulse 76   Temp 97.7 F (36.5 C) (Oral)   Wt 191 lb (86.6 kg)   SpO2 93%   BMI 25.20 kg/m       Objective:   Physical Exam  Constitutional: He is oriented to person, place, and time. He appears well-developed and well-nourished. No distress.  Cardiovascular: Normal rate, regular rhythm and intact distal pulses. Exam reveals no gallop and no friction rub.  Murmur heard. Pulmonary/Chest: Effort normal and breath sounds normal. No respiratory distress. He has no wheezes. He has no rales. He exhibits no tenderness.  Neurological: He is alert and oriented to person, place, and time.  Skin: Skin is warm and dry. No rash noted. He is not diaphoretic. No erythema. No pallor.  Psychiatric: He has a normal mood and affect. His behavior is normal.  Nursing note and vitals reviewed.     Assessment & Plan:  In the office today back seemed a little more quiet than he usually is but he was able to hold a simple conversation.  I checked a urinalysis which was negative and ordered a CBC.  I advised family to give prescribed dose of Seroquel and I will follow-up with neurology to see if they are okay with increasing his nightly dose. Also informed family that this was likely due to worsening  dementia. I also expressed concern with family about over medication at home and advised to stick to dosing guidelines set forth by his medical providers.   [name], NP

## 2017-02-25 ENCOUNTER — Telehealth: Payer: Self-pay | Admitting: Family Medicine

## 2017-02-25 ENCOUNTER — Ambulatory Visit: Payer: Self-pay | Admitting: *Deleted

## 2017-02-25 DIAGNOSIS — E1140 Type 2 diabetes mellitus with diabetic neuropathy, unspecified: Secondary | ICD-10-CM | POA: Diagnosis not present

## 2017-02-25 DIAGNOSIS — F0390 Unspecified dementia without behavioral disturbance: Secondary | ICD-10-CM | POA: Diagnosis not present

## 2017-02-25 DIAGNOSIS — I509 Heart failure, unspecified: Secondary | ICD-10-CM | POA: Diagnosis not present

## 2017-02-25 DIAGNOSIS — I4891 Unspecified atrial fibrillation: Secondary | ICD-10-CM | POA: Diagnosis not present

## 2017-02-25 DIAGNOSIS — S51011D Laceration without foreign body of right elbow, subsequent encounter: Secondary | ICD-10-CM | POA: Diagnosis not present

## 2017-02-25 DIAGNOSIS — I110 Hypertensive heart disease with heart failure: Secondary | ICD-10-CM | POA: Diagnosis not present

## 2017-02-25 LAB — CBC WITH DIFFERENTIAL/PLATELET
BASOS PCT: 0.8 % (ref 0.0–3.0)
Basophils Absolute: 0 10*3/uL (ref 0.0–0.1)
EOS ABS: 0.1 10*3/uL (ref 0.0–0.7)
Eosinophils Relative: 2.1 % (ref 0.0–5.0)
HEMATOCRIT: 27.6 % — AB (ref 39.0–52.0)
Hemoglobin: 8.5 g/dL — ABNORMAL LOW (ref 13.0–17.0)
LYMPHS ABS: 0.4 10*3/uL — AB (ref 0.7–4.0)
Lymphocytes Relative: 8.9 % — ABNORMAL LOW (ref 12.0–46.0)
MCHC: 30.9 g/dL (ref 30.0–36.0)
MCV: 78.7 fl (ref 78.0–100.0)
MONO ABS: 0.6 10*3/uL (ref 0.1–1.0)
Monocytes Relative: 13.8 % — ABNORMAL HIGH (ref 3.0–12.0)
NEUTROS ABS: 3.5 10*3/uL (ref 1.4–7.7)
NEUTROS PCT: 74.4 % (ref 43.0–77.0)
PLATELETS: 171 10*3/uL (ref 150.0–400.0)
RBC: 3.51 Mil/uL — ABNORMAL LOW (ref 4.22–5.81)
RDW: 19.3 % — AB (ref 11.5–15.5)
WBC: 4.7 10*3/uL (ref 4.0–10.5)

## 2017-02-25 NOTE — Telephone Encounter (Signed)
Copied from [place] [id number]. Topic: General - Other >> Feb 25, 2017 10:17 AM Darl Householder, RMA wrote: Reason for CRM: [place], OT from [facility] home health is requesting a verbal order to extend pt to 2 times a week x3 weeks, please return call to [place] at [phone]

## 2017-02-25 NOTE — Telephone Encounter (Signed)
Wife called regarding her husband who has Alzheimer's.  He is agitated and wanting to get out the chair constantly.  Keeps repeating "push the button" constantly and saying,  "Out, Out, Out, repeatedly.  She is needing something to calm him down.   She gave him a dose of the Haldol at 9:15am today.  It doesn't seem to help.   Also wondering how often can he have the Haldol.  No dosing instructions on the Rx.   She mentioned they gave him 2 Seroquel last night and he slept good.   I let her know the Rx is for 1 pill at night not 2.  She acknowledged she would only give him one pill from now on.

## 2017-02-25 NOTE — Telephone Encounter (Signed)
Called Ravovan and left message okaying verbal orders.

## 2017-02-25 NOTE — Telephone Encounter (Signed)
Ok for verbal orders ?

## 2017-02-25 NOTE — Telephone Encounter (Signed)
Please advise 

## 2017-02-28 ENCOUNTER — Encounter: Payer: Self-pay | Admitting: Adult Health

## 2017-03-01 DIAGNOSIS — F0390 Unspecified dementia without behavioral disturbance: Secondary | ICD-10-CM | POA: Diagnosis not present

## 2017-03-01 DIAGNOSIS — S51011D Laceration without foreign body of right elbow, subsequent encounter: Secondary | ICD-10-CM | POA: Diagnosis not present

## 2017-03-01 DIAGNOSIS — I4891 Unspecified atrial fibrillation: Secondary | ICD-10-CM | POA: Diagnosis not present

## 2017-03-01 DIAGNOSIS — E1140 Type 2 diabetes mellitus with diabetic neuropathy, unspecified: Secondary | ICD-10-CM | POA: Diagnosis not present

## 2017-03-01 DIAGNOSIS — I509 Heart failure, unspecified: Secondary | ICD-10-CM | POA: Diagnosis not present

## 2017-03-01 DIAGNOSIS — I110 Hypertensive heart disease with heart failure: Secondary | ICD-10-CM | POA: Diagnosis not present

## 2017-03-04 ENCOUNTER — Telehealth: Payer: Self-pay | Admitting: Adult Health

## 2017-03-04 ENCOUNTER — Encounter: Payer: Self-pay | Admitting: Adult Health

## 2017-03-04 MED ORDER — QUETIAPINE FUMARATE 50 MG PO TABS: 50.0000 mg | ORAL_TABLET | Freq: Every day | ORAL | 0 refills | Status: AC

## 2017-03-04 NOTE — Telephone Encounter (Signed)
Copied from [place]. Topic: Quick Communication - See Telephone Encounter >> Mar 04, 2017  3:40 PM Boyd Kerbs wrote: CRM for notification. See Telephone encounter for:  [facility] [phone]  Verbal Order for 1 visit next week for PT discharge.  (pt. Was unable to be seen this week.) 03/04/17.

## 2017-03-04 NOTE — Telephone Encounter (Signed)
Spoke to Cecille Rubin, NP who Sequ[facility] sees for his neurological disorders. She is ok with increasing Seroquel dose at this time for agitation at night   Family is ok with this plan as well

## 2017-03-04 NOTE — Telephone Encounter (Signed)
Ok for verbal orders ?

## 2017-03-05 DIAGNOSIS — I509 Heart failure, unspecified: Secondary | ICD-10-CM | POA: Diagnosis not present

## 2017-03-05 DIAGNOSIS — S51011D Laceration without foreign body of right elbow, subsequent encounter: Secondary | ICD-10-CM | POA: Diagnosis not present

## 2017-03-05 DIAGNOSIS — I110 Hypertensive heart disease with heart failure: Secondary | ICD-10-CM | POA: Diagnosis not present

## 2017-03-05 DIAGNOSIS — F0390 Unspecified dementia without behavioral disturbance: Secondary | ICD-10-CM | POA: Diagnosis not present

## 2017-03-05 DIAGNOSIS — E1140 Type 2 diabetes mellitus with diabetic neuropathy, unspecified: Secondary | ICD-10-CM | POA: Diagnosis not present

## 2017-03-05 DIAGNOSIS — I4891 Unspecified atrial fibrillation: Secondary | ICD-10-CM | POA: Diagnosis not present

## 2017-03-07 ENCOUNTER — Emergency Department (HOSPITAL_COMMUNITY): Payer: Medicare Other

## 2017-03-07 ENCOUNTER — Inpatient Hospital Stay (HOSPITAL_COMMUNITY)
Admission: EM | Admit: 2017-03-07 | Discharge: 2017-03-07 | DRG: 064 | Disposition: A | Discharge status: Hospice | Payer: Medicare Other | Attending: Internal Medicine | Admitting: Internal Medicine

## 2017-03-07 ENCOUNTER — Encounter (HOSPITAL_COMMUNITY): Payer: Self-pay | Admitting: Emergency Medicine

## 2017-03-07 ENCOUNTER — Other Ambulatory Visit: Payer: Self-pay

## 2017-03-07 DIAGNOSIS — Z515 Encounter for palliative care: Secondary | ICD-10-CM | POA: Diagnosis present

## 2017-03-07 DIAGNOSIS — J90 Pleural effusion, not elsewhere classified: Secondary | ICD-10-CM

## 2017-03-07 DIAGNOSIS — I4510 Unspecified right bundle-branch block: Secondary | ICD-10-CM | POA: Diagnosis present

## 2017-03-07 DIAGNOSIS — Z9981 Dependence on supplemental oxygen: Secondary | ICD-10-CM

## 2017-03-07 DIAGNOSIS — E119 Type 2 diabetes mellitus without complications: Secondary | ICD-10-CM | POA: Diagnosis not present

## 2017-03-07 DIAGNOSIS — I63411 Cerebral infarction due to embolism of right middle cerebral artery: Principal | ICD-10-CM | POA: Diagnosis present

## 2017-03-07 DIAGNOSIS — J9620 Acute and chronic respiratory failure, unspecified whether with hypoxia or hypercapnia: Secondary | ICD-10-CM | POA: Diagnosis present

## 2017-03-07 DIAGNOSIS — I739 Peripheral vascular disease, unspecified: Secondary | ICD-10-CM | POA: Diagnosis present

## 2017-03-07 DIAGNOSIS — R29810 Facial weakness: Secondary | ICD-10-CM | POA: Diagnosis present

## 2017-03-07 DIAGNOSIS — Z96652 Presence of left artificial knee joint: Secondary | ICD-10-CM | POA: Diagnosis present

## 2017-03-07 DIAGNOSIS — J9600 Acute respiratory failure, unspecified whether with hypoxia or hypercapnia: Secondary | ICD-10-CM | POA: Diagnosis present

## 2017-03-07 DIAGNOSIS — R402433 Glasgow coma scale score 3-8, at hospital admission: Secondary | ICD-10-CM | POA: Diagnosis present

## 2017-03-07 DIAGNOSIS — I5042 Chronic combined systolic (congestive) and diastolic (congestive) heart failure: Secondary | ICD-10-CM | POA: Diagnosis present

## 2017-03-07 DIAGNOSIS — G9340 Encephalopathy, unspecified: Secondary | ICD-10-CM | POA: Diagnosis not present

## 2017-03-07 DIAGNOSIS — F03918 Unspecified dementia, unspecified severity, with other behavioral disturbance: Secondary | ICD-10-CM | POA: Diagnosis present

## 2017-03-07 DIAGNOSIS — K219 Gastro-esophageal reflux disease without esophagitis: Secondary | ICD-10-CM | POA: Diagnosis present

## 2017-03-07 DIAGNOSIS — E1140 Type 2 diabetes mellitus with diabetic neuropathy, unspecified: Secondary | ICD-10-CM | POA: Diagnosis present

## 2017-03-07 DIAGNOSIS — Z87891 Personal history of nicotine dependence: Secondary | ICD-10-CM

## 2017-03-07 DIAGNOSIS — G629 Polyneuropathy, unspecified: Secondary | ICD-10-CM | POA: Diagnosis present

## 2017-03-07 DIAGNOSIS — D649 Anemia, unspecified: Secondary | ICD-10-CM | POA: Diagnosis present

## 2017-03-07 DIAGNOSIS — R4189 Other symptoms and signs involving cognitive functions and awareness: Secondary | ICD-10-CM | POA: Diagnosis present

## 2017-03-07 DIAGNOSIS — G92 Toxic encephalopathy: Secondary | ICD-10-CM | POA: Diagnosis present

## 2017-03-07 DIAGNOSIS — R4020 Unspecified coma: Secondary | ICD-10-CM | POA: Diagnosis not present

## 2017-03-07 DIAGNOSIS — R29722 NIHSS score 22: Secondary | ICD-10-CM | POA: Diagnosis present

## 2017-03-07 DIAGNOSIS — I110 Hypertensive heart disease with heart failure: Secondary | ICD-10-CM | POA: Diagnosis present

## 2017-03-07 DIAGNOSIS — E1151 Type 2 diabetes mellitus with diabetic peripheral angiopathy without gangrene: Secondary | ICD-10-CM | POA: Diagnosis present

## 2017-03-07 DIAGNOSIS — I679 Cerebrovascular disease, unspecified: Secondary | ICD-10-CM | POA: Diagnosis not present

## 2017-03-07 DIAGNOSIS — H9190 Unspecified hearing loss, unspecified ear: Secondary | ICD-10-CM | POA: Diagnosis present

## 2017-03-07 DIAGNOSIS — J189 Pneumonia, unspecified organism: Secondary | ICD-10-CM | POA: Diagnosis present

## 2017-03-07 DIAGNOSIS — R627 Adult failure to thrive: Secondary | ICD-10-CM

## 2017-03-07 DIAGNOSIS — F0391 Unspecified dementia with behavioral disturbance: Secondary | ICD-10-CM | POA: Diagnosis present

## 2017-03-07 DIAGNOSIS — C679 Malignant neoplasm of bladder, unspecified: Secondary | ICD-10-CM | POA: Diagnosis not present

## 2017-03-07 DIAGNOSIS — I4891 Unspecified atrial fibrillation: Secondary | ICD-10-CM | POA: Diagnosis present

## 2017-03-07 DIAGNOSIS — I5032 Chronic diastolic (congestive) heart failure: Secondary | ICD-10-CM | POA: Diagnosis present

## 2017-03-07 DIAGNOSIS — E869 Volume depletion, unspecified: Secondary | ICD-10-CM | POA: Diagnosis present

## 2017-03-07 DIAGNOSIS — Z79899 Other long term (current) drug therapy: Secondary | ICD-10-CM

## 2017-03-07 DIAGNOSIS — I119 Hypertensive heart disease without heart failure: Secondary | ICD-10-CM | POA: Diagnosis present

## 2017-03-07 DIAGNOSIS — G8194 Hemiplegia, unspecified affecting left nondominant side: Secondary | ICD-10-CM | POA: Diagnosis present

## 2017-03-07 DIAGNOSIS — I639 Cerebral infarction, unspecified: Secondary | ICD-10-CM | POA: Diagnosis not present

## 2017-03-07 DIAGNOSIS — R29818 Other symptoms and signs involving the nervous system: Secondary | ICD-10-CM | POA: Diagnosis not present

## 2017-03-07 DIAGNOSIS — J9601 Acute respiratory failure with hypoxia: Secondary | ICD-10-CM | POA: Diagnosis not present

## 2017-03-07 DIAGNOSIS — Z66 Do not resuscitate: Secondary | ICD-10-CM | POA: Diagnosis present

## 2017-03-07 DIAGNOSIS — I10 Essential (primary) hypertension: Secondary | ICD-10-CM | POA: Diagnosis not present

## 2017-03-07 DIAGNOSIS — I5022 Chronic systolic (congestive) heart failure: Secondary | ICD-10-CM | POA: Diagnosis present

## 2017-03-07 DIAGNOSIS — R402421 Glasgow coma scale score 9-12, in the field [EMT or ambulance]: Secondary | ICD-10-CM | POA: Diagnosis not present

## 2017-03-07 DIAGNOSIS — Z8551 Personal history of malignant neoplasm of bladder: Secondary | ICD-10-CM

## 2017-03-07 DIAGNOSIS — J918 Pleural effusion in other conditions classified elsewhere: Secondary | ICD-10-CM | POA: Diagnosis not present

## 2017-03-07 DIAGNOSIS — G309 Alzheimer's disease, unspecified: Secondary | ICD-10-CM | POA: Diagnosis not present

## 2017-03-07 DIAGNOSIS — R404 Transient alteration of awareness: Secondary | ICD-10-CM | POA: Diagnosis not present

## 2017-03-07 LAB — I-STAT CHEM 8, ED
BUN: 26 mg/dL — AB (ref 6–20)
CREATININE: 1 mg/dL (ref 0.61–1.24)
Calcium, Ion: 1.11 mmol/L — ABNORMAL LOW (ref 1.15–1.40)
Chloride: 99 mmol/L — ABNORMAL LOW (ref 101–111)
GLUCOSE: 99 mg/dL (ref 65–99)
HCT: 28 % — ABNORMAL LOW (ref 39.0–52.0)
HEMOGLOBIN: 9.5 g/dL — AB (ref 13.0–17.0)
Potassium: 3.9 mmol/L (ref 3.5–5.1)
Sodium: 145 mmol/L (ref 135–145)
TCO2: 35 mmol/L — AB (ref 22–32)

## 2017-03-07 LAB — COMPREHENSIVE METABOLIC PANEL
ALBUMIN: 3.3 g/dL — AB (ref 3.5–5.0)
ALK PHOS: 117 U/L (ref 38–126)
ALT: 12 U/L — AB (ref 17–63)
AST: 18 U/L (ref 15–41)
Anion gap: 9 (ref 5–15)
BILIRUBIN TOTAL: 0.7 mg/dL (ref 0.3–1.2)
BUN: 24 mg/dL — AB (ref 6–20)
CALCIUM: 8.7 mg/dL — AB (ref 8.9–10.3)
CO2: 30 mmol/L (ref 22–32)
CREATININE: 1.01 mg/dL (ref 0.61–1.24)
Chloride: 103 mmol/L (ref 101–111)
GFR calc Af Amer: >60 mL/min (ref >60)
GLUCOSE: 95 mg/dL (ref 65–99)
Potassium: 3.8 mmol/L (ref 3.5–5.1)
Sodium: 142 mmol/L (ref 135–145)
TOTAL PROTEIN: 6.5 g/dL (ref 6.5–8.1)

## 2017-03-07 LAB — DIFFERENTIAL
BASOS ABS: 0 10*3/uL (ref 0.0–0.1)
Basophils Relative: 1 %
EOS ABS: 0.2 10*3/uL (ref 0.0–0.7)
Eosinophils Relative: 6 %
LYMPHS ABS: 0.6 10*3/uL — AB (ref 0.7–4.0)
LYMPHS PCT: 20 %
MONOS PCT: 16 %
Monocytes Absolute: 0.5 10*3/uL (ref 0.1–1.0)
NEUTROS PCT: 57 %
Neutro Abs: 1.8 10*3/uL (ref 1.7–7.7)

## 2017-03-07 LAB — CBC
HEMATOCRIT: 29.4 % — AB (ref 39.0–52.0)
HEMOGLOBIN: 8.7 g/dL — AB (ref 13.0–17.0)
MCH: 23.6 pg — AB (ref 26.0–34.0)
MCHC: 29.6 g/dL — AB (ref 30.0–36.0)
MCV: 79.9 fL (ref 78.0–100.0)
Platelets: 146 10*3/uL — ABNORMAL LOW (ref 150–400)
RBC: 3.68 MIL/uL — AB (ref 4.22–5.81)
RDW: 17.6 % — ABNORMAL HIGH (ref 11.5–15.5)
WBC: 3 10*3/uL — ABNORMAL LOW (ref 4.0–10.5)

## 2017-03-07 LAB — BRAIN NATRIURETIC PEPTIDE: B NATRIURETIC PEPTIDE 5: 144.6 pg/mL — AB (ref 0.0–100.0)

## 2017-03-07 LAB — PROTIME-INR
INR: 1.24
Prothrombin Time: 15.5 seconds — ABNORMAL HIGH (ref 11.4–15.2)

## 2017-03-07 LAB — CBG MONITORING, ED: Glucose-Capillary: 95 mg/dL (ref 65–99)

## 2017-03-07 LAB — I-STAT TROPONIN, ED: TROPONIN I, POC: 0.01 ng/mL (ref 0.00–0.08)

## 2017-03-07 LAB — APTT: APTT: 34 s (ref 24–36)

## 2017-03-07 LAB — ETHANOL: Alcohol, Ethyl (B): <10 mg/dL (ref <10)

## 2017-03-07 LAB — I-STAT CG4 LACTIC ACID, ED
LACTIC ACID, VENOUS: 0.88 mmol/L (ref 0.5–1.9)
Lactic Acid, Venous: 0.82 mmol/L (ref 0.5–1.9)

## 2017-03-07 MED ORDER — STROKE: EARLY STAGES OF RECOVERY BOOK
Freq: Once | Status: DC
  Filled 2017-03-07: qty 1

## 2017-03-07 MED ORDER — INSULIN ASPART 100 UNIT/ML ~~LOC~~ SOLN: 0.0000 [IU] | Freq: Three times a day (TID) | SUBCUTANEOUS | Status: DC

## 2017-03-07 MED ORDER — NALOXONE HCL 2 MG/2ML IJ SOSY
PREFILLED_SYRINGE | INTRAMUSCULAR | Status: AC
  Filled 2017-03-07: qty 2

## 2017-03-07 MED ORDER — ONDANSETRON HCL 4 MG/2ML IJ SOLN: 4.0000 mg | Freq: Three times a day (TID) | INTRAMUSCULAR | Status: DC | PRN

## 2017-03-07 MED ORDER — ACETAMINOPHEN 650 MG RE SUPP: 650.0000 mg | RECTAL | Status: DC | PRN

## 2017-03-07 MED ORDER — ACETAMINOPHEN 160 MG/5ML PO SOLN: 650.0000 mg | ORAL | Status: DC | PRN

## 2017-03-07 MED ORDER — LORAZEPAM 2 MG/ML IJ SOLN: 0.5000 mg | INTRAMUSCULAR | Status: DC | PRN

## 2017-03-07 MED ORDER — SODIUM CHLORIDE 0.9 % IV SOLN: INTRAVENOUS | Status: DC

## 2017-03-07 MED ORDER — FUROSEMIDE 10 MG/ML IJ SOLN
40.0000 mg | Freq: Once | INTRAMUSCULAR | Status: AC
  Administered 2017-03-07: 40 mg via INTRAVENOUS
  Filled 2017-03-07: qty 4

## 2017-03-07 MED ORDER — ACETAMINOPHEN 650 MG RE SUPP: 650.0000 mg | Freq: Four times a day (QID) | RECTAL | 0 refills | Status: AC | PRN

## 2017-03-07 MED ORDER — DEXTROSE 5 % IV SOLN
2.0000 g | Freq: Once | INTRAVENOUS | Status: DC
  Filled 2017-03-07: qty 2

## 2017-03-07 MED ORDER — IOPAMIDOL (ISOVUE-370) INJECTION 76%
INTRAVENOUS | Status: AC
  Administered 2017-03-07: 90 mL
  Filled 2017-03-07: qty 50

## 2017-03-07 MED ORDER — ENOXAPARIN SODIUM 40 MG/0.4ML ~~LOC~~ SOLN: 40.0000 mg | SUBCUTANEOUS | Status: DC

## 2017-03-07 MED ORDER — NALOXONE HCL 2 MG/2ML IJ SOSY
2.0000 mg | PREFILLED_SYRINGE | Freq: Once | INTRAMUSCULAR | Status: AC
  Administered 2017-03-07: 2 mg via INTRAVENOUS

## 2017-03-07 MED ORDER — IOPAMIDOL (ISOVUE-370) INJECTION 76%
INTRAVENOUS | Status: AC
  Filled 2017-03-07: qty 50

## 2017-03-07 MED ORDER — ACETAMINOPHEN 325 MG PO TABS: 650.0000 mg | ORAL_TABLET | ORAL | Status: DC | PRN

## 2017-03-07 MED ORDER — GLYCOPYRROLATE 0.2 MG/ML IJ SOLN: 0.4000 mg | Freq: Four times a day (QID) | INTRAMUSCULAR | Status: DC | PRN

## 2017-03-07 MED ORDER — ACETAMINOPHEN 650 MG RE SUPP: 650.0000 mg | Freq: Four times a day (QID) | RECTAL | Status: DC | PRN

## 2017-03-07 MED ORDER — VANCOMYCIN HCL 10 G IV SOLR
1750.0000 mg | Freq: Once | INTRAVENOUS | Status: AC
  Administered 2017-03-07: 1750 mg via INTRAVENOUS
  Filled 2017-03-07: qty 1750

## 2017-03-07 MED ORDER — SENNOSIDES-DOCUSATE SODIUM 8.6-50 MG PO TABS: 1.0000 | ORAL_TABLET | Freq: Every evening | ORAL | Status: DC | PRN

## 2017-03-07 MED ORDER — VANCOMYCIN HCL IN DEXTROSE 1-5 GM/200ML-% IV SOLN: 1000.0000 mg | Freq: Once | INTRAVENOUS | Status: DC

## 2017-03-07 MED ORDER — HYDROMORPHONE HCL 1 MG/ML IJ SOLN: 0.5000 mg | INTRAMUSCULAR | Status: DC | PRN

## 2017-03-07 NOTE — Progress Notes (Signed)
Palliative Medicine RN Note: Patient has just arrived to 3W28. Observed patient; he is requiring 2 NTs to clean him with RN assist. He is awake and moaning, on a VM.   Per chart, history is extensive (dementia, afib, hx GIB, bladder ca, HTN, DM, CHF), and he is currently here with a new CVA, pleural effusion, and pneumonia. At baseline he was unable to make his needs known easily and could only stand with assist. Discussed patient with Dr Hilma Favors.   Daughter [name], son Gerald Stabs present, and wife [name] on the phone for a family meeting. His children report that he has always trusted them to make the hard, correct decisions for his care and that they all desire a natural death. Both children also report that he has had a marked decline in functional status over the last few weeks and hasn't eaten well in days. They understand that he has been dying for weeks. We discussed the role of antibiotics and that a purely natural death does not involve antibiotics/treatment of infections that won't change the outcome.  The patient is fortunate to have 24 hour care givers at home, but they are not able to manage a patient who will have such a high symptom burden. Perlie Gold, and [name] are all requesting a referral to inpatient hospice, specifically [facility], as [name] is an amputee and cannot easily visit other facilities. I spoke with RN, SW Kathlee Nations, RT, and BP Rep Harmon Pier regarding referral. Discussed pt with Dr Hilma Favors, who gave comfort orders.   Family has my contact information. Paged Dr Marthenia Rolling for update; waiting on return call.   Marjie Skiff Jaivion Kingsley, RN, BSN, [facility] 03/07/2017 10:43 AM Cell [phone] 8:00-4:00 Monday-Friday Office [phone]

## 2017-03-07 NOTE — Progress Notes (Signed)
Report called to [facility].  Awaiting PTAR for transport.  Will continue to monitor.  Cori Razor, RN

## 2017-03-07 NOTE — ED Notes (Signed)
Patient transported to CT 

## 2017-03-07 NOTE — Progress Notes (Signed)
Patient was seen this morning by my colleague Dr. Rory Percy.  Now just learned that patient will go to hospice in [facility].  83 year old male with history of A. fib not on anticoagulation due to GI bleeding, CHF, dementia, hypertension, diabetes admitted for left-sided weakness.  Patient at baseline modified Rankin score was 4, not able to walk without assistance.  CT negative.  CT head and neck showed bilateral carotid siphon atherosclerosis, and left large pleural effusion.  CT perfusion showed right MCA infarct.  Patient had difficulty with oxygen saturation in the ER, O2 sats 80s.  Pt on facemask.  Examination limited due to comfort care measures.  However, patient was able to tell me his name, place, and people, however, not able to orientated to time.  Severe dysarthria. RUE 4/5 and LUE 3/5, BLE 3+/5. Complains of loss of sensation on the left LE. Family refused MRI and this morning decided no further intervention, or escalation of care.  Requested comfort care with [facility] placement.   Neurology will sign off. Please call with questions. Thanks for the consult.  Rosalin Hawking, MD PhD Stroke Neurology 03/07/2017 1:52 PM

## 2017-03-07 NOTE — Telephone Encounter (Signed)
Called and spoke to [place].  Advised she proceed with orders per [facility].

## 2017-03-07 NOTE — Care Management Note (Signed)
Case Management Note  Patient Details  Name: [name] MRN: [id number] Date of Birth: 05/08/1927  Subjective/Objective:                    Action/Plan: Pt discharging to [facility]. No further needs per CM.  Expected Discharge Date:  03/07/17               Expected Discharge Plan:  [place]  In-House Referral:  Clinical Social Work  Discharge planning Services     Post Acute Care Choice:    Choice offered to:     DME Arranged:    DME Agency:     HH Arranged:    [place] Agency:     Status of Service:  Completed, signed off  If discussed at [employer], dates discussed:    Additional Comments:  Pollie Friar, RN 03/07/2017, 2:08 PM

## 2017-03-07 NOTE — Progress Notes (Signed)
Pt arrived to 3W28 via stretcher, family at bedside.  Will continue to monitor.  Cori Razor, RN

## 2017-03-07 NOTE — H&P (Signed)
History and Physical    [name] RCB:[id number] DOB: May 31, 1927 DOA: 03/07/2017  PCP: [name], NP Patient coming from: home  Chief Complaint: stroke  HPI: [name] is a 83 y.o. male with medical history significant for advanced dementia, A. fib not on anticoagulation due to GI bleed in the past, hypertension, diabetes, bladder cancer, chronic respiratory failure, CHF, presents to the emergency department sudden onset left-sided weakness and facial droop. [facility] hospitalists asked to admit.  Information is obtained from the chart and the family is at the bedside. Son reports patient went to bed around 9:00 last night in his usual state of health. At baseline patient uses a walker with a very unsteady gait, has advanced dementia is not always able to make his wants and needs known and unable to perform ADLs. Son reports at best he is able to transfer to bedside commode with assistance. Son reports he has been "declining rapidly over the last several weeks". They report decreased oral intake worsening cough. Morning he awakened with obvious left-sided weakness and facial droop. Family denies any recent acute illness no fever chills worsening shortness of breath. No nausea vomiting. No dysuria hematuria frequency or urgency. No diarrhea constipation melena or bright red blood per rectum. Patient has 24-hour care at home and family notes is been getting weaker and weaker.   ED Course: In the emergency department he's afebrile hemodynamically stable with oxygen saturation level 89%. Is evaluated by the stroke team who opines acute ischemic stroke of the right MCA secondary to atrial fibrillation recommends admission with palliative care consult for comfort measures only.  Review of Systems: As per HPI otherwise all other systems reviewed and are negative.   Ambulatory Status: Uses a walker to bear weight and transfer not considered and ambulatory. Unable to perform ADLs  Past Medical  History:  Diagnosis Date  . Anemia    iron deficiency secondary to bleeding  . Arrhythmia    a fib  . Arthritis    "all over" (10/28/2016)  . Atrial fibrillation ([place])   . Bladder cancer (HCC)    BC[facility] [name]  . CHF (congestive heart failure) ([employer])   . Dementia    "he's had it for a number of years" (10/28/2016)  . ED (erectile dysfunction)   . Genital warts    recurrent. Not a problem  . GERD (gastroesophageal reflux disease)    treated with PPI  . Hard of hearing   . Hematochezia    has intermittent problem  . History of blood transfusion    "related to low HgB" (10/28/2016)  . History of hiatal hernia    "years ago" (10/28/2016)  . Hypertension    mild  . Irritable bladder   . Memory disorder   . Neuropathy    burning discomfort feet/legs  . On home oxygen therapy    "2L at night" (10/28/2016)  . Pneumonia    "twice" (10/28/2016)  . Restless leg    treats with heat  . Type II diabetes mellitus (HCC)    NIDDM on oral medication    Past Surgical History:  Procedure Laterality Date  . APPENDECTOMY  1946  . CARDIAC CATHETERIZATION N/A 01/13/2015   Procedure: Pericardiocentesis;  Surgeon: Leonie Man, MD;  Location: [facility];  Service: Cardiovascular;  Laterality: N/A;  . CARDIOVASCULAR STRESS TEST  03/28/2003   EF 55%.  Normal cardiolite study  . JOINT REPLACEMENT  1990's   left TKR  .  KNEE ARTHROSCOPY Right 1980's  . TONSILLECTOMY  1938    Social History   Socioeconomic History  . Marital status: Married    Spouse name: [name]  . Number of children: 4  . Years of education: 10th   . Highest education level: Not on file  Social Needs  . Financial resource strain: Not on file  . Food insecurity - worry: Not on file  . Food insecurity - inability: Not on file  . Transportation needs - medical: Not on file  . Transportation needs - non-medical: Not on file  Occupational History  . Occupation: retired    Fish farm manager: RETIRED  Tobacco Use  . Smoking  status: Former Smoker    Packs/day: 2.00    Years: 40.00    Pack years: 80.00    Types: Cigarettes    Last attempt to quit: 08/28/1975    Years since quitting: 41.5  . Smokeless tobacco: Former Systems developer    Types: Chew    Quit date: 1980  Substance and Sexual Activity  . Alcohol use: No  . Drug use: No  . Sexual activity: Not Currently  Other Topics Concern  . Not on file  Social History Narrative   Patient lives at home with his wife [name]) Married 49 years.   Retired   Education 10 th grade   Right handed   Caffeine one or two daily     Allergies  Allergen Reactions  . Cephalexin Other (See Comments)    Unknown reaction  . Furacin [Nitrofurazone] Other (See Comments)    Unknown reaction  . Macrodantin Other (See Comments)    Unknown reaction  . Zestril [Lisinopril] Other (See Comments)    Unknown reaction    Family History  Problem Relation Age of Onset  . Kidney disease Mother   . Heart disease Mother   . Stroke Mother   . Cancer Father        prostate  . Mental illness Brother   . Diabetes Neg Hx   . COPD Neg Hx   . Heart attack Neg Hx   . Hypertension Neg Hx     Prior to Admission medications   Medication Sig Start Date End Date Taking? Authorizing Provider  acetaminophen (TYLENOL) 500 MG tablet Take 500 mg by mouth at bedtime.   Yes [provider]  folic acid (FOLVITE) 1 MG tablet Take 1 mg by mouth 2 (two) times daily.    Yes [provider]  furosemide (LASIX) 40 MG tablet Take 40-80 mg by mouth See admin instructions. Take 2 tablets (80 mg) by mouth every morning and take 1 tablet (40 mg) on Monday, Wednesday and Friday night 12/23/16  Yes End, Harrell Gave, MD  haloperidol (HALDOL) 0.5 MG tablet TAKE (1) TABLET DAILY AS NEEDED. Patient taking differently: TAKE ONE TABLET (0.5 MG) BY MOUTH DAILY AS NEEDED FOR AGITATION 02/23/17  Yes Nafziger, Tommi Rumps, NP  hydrOXYzine (ATARAX/VISTARIL) 10 MG tablet TAKE (1) TABLET TWICE A DAY AS NEEDED. Patient  taking differently: TAKE 1 TABLET BY MOUTH TWICE DAILY 12/23/16  Yes Nafziger, Tommi Rumps, NP  iron polysaccharides (NIFEREX) 150 MG capsule Take 1 capsule (150 mg total) by mouth 2 (two) times daily. 12/24/16 03/24/17 Yes Nafziger, Tommi Rumps, NP  LORazepam (ATIVAN) 0.5 MG tablet TAKE (1) TABLET TWICE A DAY AS NEEDED. Patient taking differently: TAKE ONE TABLET (0.5 MG) BY MOUTH TWICE DAILY 12/23/16  Yes Nafziger, Tommi Rumps, NP  Memantine HCl-Donepezil HCl (NAMZARIC) 28-10 MG CP24 Take 1 capsule every morning by  mouth. Patient taking differently: Take 1 capsule by mouth daily.  01/31/17  Yes Marcial Pacas, MD  metoprolol tartrate (LOPRESSOR) 25 MG tablet Take 0.5 tablets (12.5 mg total) by mouth 2 (two) times daily. 08/25/16  Yes Eileen Stanford, PA-C  mirabegron ER (MYRBETRIQ) 50 MG TB24 tablet Take 1 tablet (50 mg total) by mouth daily. Patient taking differently: Take 50 mg by mouth daily with supper.  10/28/16  Yes Nafziger, Tommi Rumps, NP  Multiple Vitamin (MULTIVITAMIN WITH MINERALS) TABS tablet Take 1 tablet by mouth daily. Centrum Silver   Yes [provider]  Multiple Vitamins-Minerals (PRESERVISION AREDS 2 PO) Take 1 capsule by mouth 2 (two) times daily.    Yes [provider]  nitroGLYCERIN (NITROSTAT) 0.4 MG SL tablet Place 1 tablet (0.4 mg total) under the tongue every 5 (five) minutes as needed for chest pain (x 3 doses daily). 01/07/16  Yes Nafziger, Tommi Rumps, NP  OXYGEN Inhale 2 L into the lungs as needed (low sats).   Yes [provider]  pantoprazole (PROTONIX) 40 MG tablet TAKE 1 TABLET TWICE DAILY. Patient taking differently: TAKE 1 TABLET (40 MG) TWICE DAILY. 12/24/16  Yes Nafziger, Tommi Rumps, NP  QUEtiapine (SEROQUEL) 50 MG tablet Take 1 tablet (50 mg total) by mouth at bedtime. 03/04/17 04/03/17 Yes Nafziger, Tommi Rumps, NP  FERREX 150 150 MG capsule TAKE (1) CAPSULE TWICE DAILY. Patient not taking: Reported on 03/07/2017 12/24/16   [name], NP    Physical Exam: Vitals:   03/07/17 0458  03/07/17 0508  BP: 125/76   Pulse: 67   Resp: 20   Temp:  98 F (36.7 C)  TempSrc:  Temporal  SpO2: (!) 89%      General:  Thin frail pale in no acute distress  Eyes:  PERRL, EOMI, normal lids, iris ENT:  grossly normal hearing, lips & tongue, is membranes of his mouth are dry  Neck:  no LAD, masses or thyromegaly Cardiovascular:  irregularly irregular  no m/r/g. No LE edema.  Respiratory:  no increased work of breathing respiration shallow breath sounds absent on the left  Abdomen:  soft, ntnd, sluggish bowel sounds no guarding or rebounding  Skin:  dry no rashes cool to touch Musculoskeletal:  Joints without swelling/erythema  Psychiatric:  grossly normal mood and affect, speech fluent and appropriate, AOx3 Neurologic:  does not open eyes to voice or mild sternal rub. Left upper extremity flaccid.  Left lower extremity flaccid.   Labs on Admission: I have personally reviewed following labs and imaging studies  CBC: Recent Labs  Lab 03/07/17 0459 03/07/17 0513  WBC 3.0*  --   NEUTROABS 1.8  --   HGB 8.7* 9.5*  HCT 29.4* 28.0*  MCV 79.9  --   PLT 146*  --    Basic Metabolic Panel: Recent Labs  Lab 03/07/17 0459 03/07/17 0513  NA 142 145  K 3.8 3.9  CL 103 99*  CO2 30  --   GLUCOSE 95 99  BUN 24* 26*  CREATININE 1.01 1.00  CALCIUM 8.7*  --    GFR: Estimated Creatinine Clearance: 58.8 mL/min (by C-G formula based on SCr of 1 mg/dL). Liver Function Tests: Recent Labs  Lab 03/07/17 0459  AST 18  ALT 12*  ALKPHOS 117  BILITOT 0.7  PROT 6.5  ALBUMIN 3.3*   No results for input(s): LIPASE, AMYLASE in the last 168 hours. No results for input(s): AMMONIA in the last 168 hours. Coagulation Profile: Recent Labs  Lab 03/07/17 0459  INR 1.24   Cardiac Enzymes: No results for input(s): CKTOTAL, CKMB, CKMBINDEX, TROPONINI in the last 168 hours. BNP (last 3 results) Recent Labs    04/26/16 1519 08/25/16 1510 02/07/17 1632  PROBNP 1,905* 1,266* 173.0*    HbA1C: No results for input(s): HGBA1C in the last 72 hours. CBG: Recent Labs  Lab 03/07/17 0502  GLUCAP 95   Lipid Profile: No results for input(s): CHOL, HDL, LDLCALC, TRIG, CHOLHDL, LDLDIRECT in the last 72 hours. Thyroid Function Tests: No results for input(s): TSH, T4TOTAL, FREET4, T3FREE, THYROIDAB in the last 72 hours. Anemia Panel: No results for input(s): VITAMINB12, FOLATE, FERRITIN, TIBC, IRON, RETICCTPCT in the last 72 hours. Urine analysis:    Component Value Date/Time   COLORURINE YELLOW 08/18/2015 [place] 08/18/2015 1725   LABSPEC 1.011 08/18/2015 1725   PHURINE 7.0 08/18/2015 1725   GLUCOSEU NEGATIVE 08/18/2015 1725   GLUCOSEU NEGATIVE 11/17/2012 1216   HGBUR NEGATIVE 08/18/2015 1725   BILIRUBINUR NEG 02/24/2017 1559   KETONESUR NEGATIVE 08/18/2015 1725   PROTEINUR NEG 02/24/2017 1559   PROTEINUR NEGATIVE 08/18/2015 1725   UROBILINOGEN 0.2 02/24/2017 1559   UROBILINOGEN 0.2 01/10/2015 1424   NITRITE NEG 02/24/2017 1559   NITRITE NEGATIVE 08/18/2015 1725   LEUKOCYTESUR Negative 02/24/2017 1559    Creatinine Clearance: Estimated Creatinine Clearance: 58.8 mL/min (by C-G formula based on SCr of 1 mg/dL).  Sepsis Labs: @LABRCNTIP (procalcitonin:4,lacticidven:4) )No results found for this or any previous visit (from the past 240 hour(s)).   Radiological Exams on Admission: Ct Angio Head W Or Wo Contrast  Result Date: 03/07/2017 CLINICAL DATA:  Initial evaluation for acute unresponsiveness. Left-sided weakness. EXAM: CT ANGIOGRAPHY HEAD AND NECK CT PERFUSION BRAIN TECHNIQUE: Multidetector CT imaging of the head and neck was performed using the standard protocol during bolus administration of intravenous contrast. Multiplanar CT image reconstructions and MIPs were obtained to evaluate the vascular anatomy. Carotid stenosis measurements (when applicable) are obtained utilizing NASCET criteria, using the distal internal carotid diameter as the  denominator. Multiphase CT imaging of the brain was performed following IV bolus contrast injection. Subsequent parametric perfusion maps were calculated using RAPID software. CONTRAST:  66mL ISOVUE-370 IOPAMIDOL (ISOVUE-370) INJECTION 76% COMPARISON:  Prior head CT from earlier same day. FINDINGS: CTA NECK FINDINGS Aortic arch: Examination markedly limited due to contrast bolus and poor cardiac output. The visualized aortic arch is normal in caliber with normal branch pattern. Atheromatous plaque about the aortic arch and proximal great vessels without flow-limiting stenosis. Visualized subclavian artery is patent. Right carotid system: Right common carotid artery patent from its origin to the bifurcation without stenosis. No significant atheromatous narrowing about the right bifurcation. Right ICA patent to the skullbase without stenosis, dissection, or occlusion. Left carotid system: Left common carotid artery patent from its origin to the bifurcation without stenosis. No significant atheromatous narrowing about the left bifurcation. Left ICA patent from the bifurcation to the skullbase without flow-limiting stenosis, dissection, or occlusion. Vertebral arteries: Both of the vertebral artery is arise from the subclavian arteries. Left vertebral artery dominant. Left vertebral artery grossly patent within the neck without obvious stenosis. Right vertebral artery diffusely hypoplastic and not well visualized, but grossly patent as well without occlusion. Skeleton: No obvious acute osseous abnormality, although evaluation limited by positioning. No discrete lytic or blastic osseous lesions. Advanced degenerative spondylolysis and facet arthrosis present within the cervical spine. Mild compression deformity at the superior endplate of T5 favored to be chronic. Other neck: No acute soft tissue abnormality  within the neck. No adenopathy. Thyroid grossly normal. Upper chest: Upper mediastinum demonstrates no acute  abnormality. Severe cardiomegaly partially visualized. Large left pleural effusion with compressive atelectasis, with only a small portion of the upper left lung aerated. Moderate layering right pleural effusion. Underlying emphysema. Review of the MIP images confirms the above findings CTA HEAD FINDINGS Anterior circulation: Examination of the intracranial circulation markedly limited by poor contrast bolus and low cardiac output. Petrous segments patent bilaterally. Extensive atheromatous plaque throughout the cavernous ICAs with moderate diffuse narrowing. ICA termini are patent bilaterally. A1 segments patent. Grossly normal anterior communicating artery. Anterior cerebral artery is patent proximally, not well evaluated distally due to contrast bolus. M1 segments patent without stenosis or occlusion. No proximal M2 occlusion. Right MCA branches fairly well perfused. There is decreased perfusion of the left MCA branches as compared to the right, which may be related cardiac output. No obvious flow-limiting stenosis or vascular occlusion. Posterior circulation: Dominant left vertebral artery patent to the vertebrobasilar junction without stenosis. Diminutive right vertebral artery not well visualize, and may include prior to the vertebrobasilar junction. Posterior inferior cerebral arteries not well evaluated. Basilar artery patent to its distal aspect without stenosis or occlusion. Superior cerebral arteries patent proximally. Posterior cerebral artery is grossly patent proximally, not well evaluated distally. In particular, the right PCA is extremely poorly visualized distally, which may be due to a 80 high-grade stenosis. Scant flow is seen distally within the right PCA territory. Probable fetal type origin of the left PCA. Venous sinuses: Patent. Anatomic variants: Fetal type left PCA. No obvious intracranial aneurysm. Delayed phase: Not performed. Review of the MIP images confirms the above findings CT Brain  Perfusion Findings: CBF (<30%) Volume: 67mL Perfusion (Tmax>6.0s) volume: 44mL Mismatch Volume: 03/08/1980mL Infarction Location:Delayed perfusion within knee posterior right MCA/right MCA PCA watershed territory. No frank core infarct. IMPRESSION: 1. Extremely limited study demonstrating no obvious large vessel occlusion. 2. Delayed perfusion within the posterior right MCA territory/right MCA/PCA watershed region. No core infarct. 3. Poor visualization of the distal right PCA, suspected to be related to poor contrast bolus with probable proximal high-grade stenosis. 4. Poor visualization of the distal left MCA branches, also suspected to be related poor cardiac output in contrast bolus and probable fetal type left PCA. No obvious focal proximal stenosis identified. 5. Large left pleural effusion with associated compressive atelectasis of the left lung. Additional moderate layering right pleural effusion. Underlying emphysema. 6. Advanced degenerative spondylolysis and facet arthrosis within cervical spine. Critical Value/emergent results were called by telephone at the time of interpretation on 03/07/2017 at 5:55 am to Dr. Amie Portland , who verbally acknowledged these results. Electronically Signed   By: Jeannine Boga M.D.   On: 03/07/2017 06:22   Ct Angio Neck W Or Wo Contrast  Result Date: 03/07/2017 CLINICAL DATA:  Initial evaluation for acute unresponsiveness. Left-sided weakness. EXAM: CT ANGIOGRAPHY HEAD AND NECK CT PERFUSION BRAIN TECHNIQUE: Multidetector CT imaging of the head and neck was performed using the standard protocol during bolus administration of intravenous contrast. Multiplanar CT image reconstructions and MIPs were obtained to evaluate the vascular anatomy. Carotid stenosis measurements (when applicable) are obtained utilizing NASCET criteria, using the distal internal carotid diameter as the denominator. Multiphase CT imaging of the brain was performed following IV bolus contrast  injection. Subsequent parametric perfusion maps were calculated using RAPID software. CONTRAST:  79mL ISOVUE-370 IOPAMIDOL (ISOVUE-370) INJECTION 76% COMPARISON:  Prior head CT from earlier same day. FINDINGS: CTA NECK FINDINGS Aortic arch:  Examination markedly limited due to contrast bolus and poor cardiac output. The visualized aortic arch is normal in caliber with normal branch pattern. Atheromatous plaque about the aortic arch and proximal great vessels without flow-limiting stenosis. Visualized subclavian artery is patent. Right carotid system: Right common carotid artery patent from its origin to the bifurcation without stenosis. No significant atheromatous narrowing about the right bifurcation. Right ICA patent to the skullbase without stenosis, dissection, or occlusion. Left carotid system: Left common carotid artery patent from its origin to the bifurcation without stenosis. No significant atheromatous narrowing about the left bifurcation. Left ICA patent from the bifurcation to the skullbase without flow-limiting stenosis, dissection, or occlusion. Vertebral arteries: Both of the vertebral artery is arise from the subclavian arteries. Left vertebral artery dominant. Left vertebral artery grossly patent within the neck without obvious stenosis. Right vertebral artery diffusely hypoplastic and not well visualized, but grossly patent as well without occlusion. Skeleton: No obvious acute osseous abnormality, although evaluation limited by positioning. No discrete lytic or blastic osseous lesions. Advanced degenerative spondylolysis and facet arthrosis present within the cervical spine. Mild compression deformity at the superior endplate of T5 favored to be chronic. Other neck: No acute soft tissue abnormality within the neck. No adenopathy. Thyroid grossly normal. Upper chest: Upper mediastinum demonstrates no acute abnormality. Severe cardiomegaly partially visualized. Large left pleural effusion with  compressive atelectasis, with only a small portion of the upper left lung aerated. Moderate layering right pleural effusion. Underlying emphysema. Review of the MIP images confirms the above findings CTA HEAD FINDINGS Anterior circulation: Examination of the intracranial circulation markedly limited by poor contrast bolus and low cardiac output. Petrous segments patent bilaterally. Extensive atheromatous plaque throughout the cavernous ICAs with moderate diffuse narrowing. ICA termini are patent bilaterally. A1 segments patent. Grossly normal anterior communicating artery. Anterior cerebral artery is patent proximally, not well evaluated distally due to contrast bolus. M1 segments patent without stenosis or occlusion. No proximal M2 occlusion. Right MCA branches fairly well perfused. There is decreased perfusion of the left MCA branches as compared to the right, which may be related cardiac output. No obvious flow-limiting stenosis or vascular occlusion. Posterior circulation: Dominant left vertebral artery patent to the vertebrobasilar junction without stenosis. Diminutive right vertebral artery not well visualize, and may include prior to the vertebrobasilar junction. Posterior inferior cerebral arteries not well evaluated. Basilar artery patent to its distal aspect without stenosis or occlusion. Superior cerebral arteries patent proximally. Posterior cerebral artery is grossly patent proximally, not well evaluated distally. In particular, the right PCA is extremely poorly visualized distally, which may be due to a 80 high-grade stenosis. Scant flow is seen distally within the right PCA territory. Probable fetal type origin of the left PCA. Venous sinuses: Patent. Anatomic variants: Fetal type left PCA. No obvious intracranial aneurysm. Delayed phase: Not performed. Review of the MIP images confirms the above findings CT Brain Perfusion Findings: CBF (<30%) Volume: 97mL Perfusion (Tmax>6.0s) volume: 36mL Mismatch  Volume: 03/08/1980mL Infarction Location:Delayed perfusion within knee posterior right MCA/right MCA PCA watershed territory. No frank core infarct. IMPRESSION: 1. Extremely limited study demonstrating no obvious large vessel occlusion. 2. Delayed perfusion within the posterior right MCA territory/right MCA/PCA watershed region. No core infarct. 3. Poor visualization of the distal right PCA, suspected to be related to poor contrast bolus with probable proximal high-grade stenosis. 4. Poor visualization of the distal left MCA branches, also suspected to be related poor cardiac output in contrast bolus and probable fetal type left PCA. No  obvious focal proximal stenosis identified. 5. Large left pleural effusion with associated compressive atelectasis of the left lung. Additional moderate layering right pleural effusion. Underlying emphysema. 6. Advanced degenerative spondylolysis and facet arthrosis within cervical spine. Critical Value/emergent results were called by telephone at the time of interpretation on 03/07/2017 at 5:55 am to Dr. Amie Portland , who verbally acknowledged these results. Electronically Signed   By: Jeannine Boga M.D.   On: 03/07/2017 06:22   Ct Cerebral Perfusion W Contrast  Result Date: 03/07/2017 CLINICAL DATA:  Initial evaluation for acute unresponsiveness. Left-sided weakness. EXAM: CT ANGIOGRAPHY HEAD AND NECK CT PERFUSION BRAIN TECHNIQUE: Multidetector CT imaging of the head and neck was performed using the standard protocol during bolus administration of intravenous contrast. Multiplanar CT image reconstructions and MIPs were obtained to evaluate the vascular anatomy. Carotid stenosis measurements (when applicable) are obtained utilizing NASCET criteria, using the distal internal carotid diameter as the denominator. Multiphase CT imaging of the brain was performed following IV bolus contrast injection. Subsequent parametric perfusion maps were calculated using RAPID software.  CONTRAST:  16mL ISOVUE-370 IOPAMIDOL (ISOVUE-370) INJECTION 76% COMPARISON:  Prior head CT from earlier same day. FINDINGS: CTA NECK FINDINGS Aortic arch: Examination markedly limited due to contrast bolus and poor cardiac output. The visualized aortic arch is normal in caliber with normal branch pattern. Atheromatous plaque about the aortic arch and proximal great vessels without flow-limiting stenosis. Visualized subclavian artery is patent. Right carotid system: Right common carotid artery patent from its origin to the bifurcation without stenosis. No significant atheromatous narrowing about the right bifurcation. Right ICA patent to the skullbase without stenosis, dissection, or occlusion. Left carotid system: Left common carotid artery patent from its origin to the bifurcation without stenosis. No significant atheromatous narrowing about the left bifurcation. Left ICA patent from the bifurcation to the skullbase without flow-limiting stenosis, dissection, or occlusion. Vertebral arteries: Both of the vertebral artery is arise from the subclavian arteries. Left vertebral artery dominant. Left vertebral artery grossly patent within the neck without obvious stenosis. Right vertebral artery diffusely hypoplastic and not well visualized, but grossly patent as well without occlusion. Skeleton: No obvious acute osseous abnormality, although evaluation limited by positioning. No discrete lytic or blastic osseous lesions. Advanced degenerative spondylolysis and facet arthrosis present within the cervical spine. Mild compression deformity at the superior endplate of T5 favored to be chronic. Other neck: No acute soft tissue abnormality within the neck. No adenopathy. Thyroid grossly normal. Upper chest: Upper mediastinum demonstrates no acute abnormality. Severe cardiomegaly partially visualized. Large left pleural effusion with compressive atelectasis, with only a small portion of the upper left lung aerated. Moderate  layering right pleural effusion. Underlying emphysema. Review of the MIP images confirms the above findings CTA HEAD FINDINGS Anterior circulation: Examination of the intracranial circulation markedly limited by poor contrast bolus and low cardiac output. Petrous segments patent bilaterally. Extensive atheromatous plaque throughout the cavernous ICAs with moderate diffuse narrowing. ICA termini are patent bilaterally. A1 segments patent. Grossly normal anterior communicating artery. Anterior cerebral artery is patent proximally, not well evaluated distally due to contrast bolus. M1 segments patent without stenosis or occlusion. No proximal M2 occlusion. Right MCA branches fairly well perfused. There is decreased perfusion of the left MCA branches as compared to the right, which may be related cardiac output. No obvious flow-limiting stenosis or vascular occlusion. Posterior circulation: Dominant left vertebral artery patent to the vertebrobasilar junction without stenosis. Diminutive right vertebral artery not well visualize, and may include prior to  the vertebrobasilar junction. Posterior inferior cerebral arteries not well evaluated. Basilar artery patent to its distal aspect without stenosis or occlusion. Superior cerebral arteries patent proximally. Posterior cerebral artery is grossly patent proximally, not well evaluated distally. In particular, the right PCA is extremely poorly visualized distally, which may be due to a 80 high-grade stenosis. Scant flow is seen distally within the right PCA territory. Probable fetal type origin of the left PCA. Venous sinuses: Patent. Anatomic variants: Fetal type left PCA. No obvious intracranial aneurysm. Delayed phase: Not performed. Review of the MIP images confirms the above findings CT Brain Perfusion Findings: CBF (<30%) Volume: 26mL Perfusion (Tmax>6.0s) volume: 62mL Mismatch Volume: 03/08/1980mL Infarction Location:Delayed perfusion within knee posterior right  MCA/right MCA PCA watershed territory. No frank core infarct. IMPRESSION: 1. Extremely limited study demonstrating no obvious large vessel occlusion. 2. Delayed perfusion within the posterior right MCA territory/right MCA/PCA watershed region. No core infarct. 3. Poor visualization of the distal right PCA, suspected to be related to poor contrast bolus with probable proximal high-grade stenosis. 4. Poor visualization of the distal left MCA branches, also suspected to be related poor cardiac output in contrast bolus and probable fetal type left PCA. No obvious focal proximal stenosis identified. 5. Large left pleural effusion with associated compressive atelectasis of the left lung. Additional moderate layering right pleural effusion. Underlying emphysema. 6. Advanced degenerative spondylolysis and facet arthrosis within cervical spine. Critical Value/emergent results were called by telephone at the time of interpretation on 03/07/2017 at 5:55 am to Dr. Amie Portland , who verbally acknowledged these results. Electronically Signed   By: Jeannine Boga M.D.   On: 03/07/2017 06:22   Dg Chest Portable 1 View  Result Date: 03/07/2017 CLINICAL DATA:  Altered mental status. EXAM: PORTABLE CHEST 1 VIEW COMPARISON:  01/12/2017 FINDINGS: Large left pleural effusion occupying the lower 2/3 of left hemithorax, increased from prior exam. Cardiomegaly again seen. Increased interstitial opacities suspicious for pulmonary edema. No confluent airspace disease in the right lung. Patient's chin obscures the lung apices. IMPRESSION: 1. Large left pleural effusion occupying the lower 2/3 of left hemithorax. 2. Cardiomegaly. Interstitial opacities suspicious for pulmonary edema. Electronically Signed   By: Jeb Levering M.D.   On: 03/07/2017 06:25   Ct Head Code Stroke Wo Contrast  Result Date: 03/07/2017 CLINICAL DATA:  Code stroke. Initial evaluation for acute facial weakness. EXAM: CT HEAD WITHOUT CONTRAST TECHNIQUE:  Contiguous axial images were obtained from the base of the skull through the vertex without intravenous contrast. COMPARISON:  Prior CT from 11/12/2015. FINDINGS: Brain: Atrophy with chronic microvascular ischemic disease. No acute intracranial hemorrhage. No acute large vessel territory infarct. No mass lesion or midline shift. No hydrocephalus. No extra-axial fluid collection. Vascular: No hyperdense vessel. Scattered vascular calcifications noted within the carotid siphons. Skull: Scalp soft tissues and calvarium within normal limits. Sinuses/Orbits: Globes and orbital soft tissues within normal limits. Mild scattered mucosal thickening within the ethmoidal air cells. Chronic right sphenoid sinusitis. Paranasal sinuses otherwise clear. Left mastoid effusion. Other: None. ASPECTS [facility] Stroke Program Early CT Score) - Ganglionic level infarction (caudate, lentiform nuclei, internal capsule, insula, M1-M3 cortex): 7 - Supraganglionic infarction (M4-M6 cortex): 3 Total score (0-10 with 10 being normal): 10 IMPRESSION: 1. No acute intracranial infarct or other process. 2. ASPECTS is 10. 3. Moderately advanced cerebral atrophy with chronic small vessel ischemic disease. Critical Value/emergent results were called by telephone at the time of interpretation on 03/07/2017 at 5:46 am to Dr. Rory Percy , who verbally acknowledged  these results. Electronically Signed   By: Jeannine Boga M.D.   On: 03/07/2017 05:47    EKG: Independently reviewed. Atrial fibrillation Ventricular premature complex Nonspecific intraventricular conduction delay Repol abnrm suggests ischemia, diffuse leads Borderline ST elevation, lateral leads  Assessment/Plan Principal Problem:   Stroke [facility]) Active Problems:   Atrial fibrillation (HCC)   Diabetes mellitus (HCC)   Benign hypertensive heart disease without heart failure   Chronic diastolic heart failure (HCC)   Acute respiratory failure (HCC)   Chronic anemia   Chronic  systolic CHF (congestive heart failure) (HCC)   Acute encephalopathy   Pleural effusion   Dementia with behavioral disturbance   RBBB   #1. Stroke/left-sided weakness. Evaluated by Dr. Rory Percy with neurology opines acute ischemic stroke of right MCA territory secondary to atrial fibrillation and recommends admission with no further stroke workup unless family decides against comfort care.  Family indicates patient is a DO NOT RESUSCITATE and would not like any "procedures" and would like to speak to Palliative Care about comfort care only -admit to medsurg -ativan prn -Palliative care consutl -If the decision is made to preserve further medical intervention will initiate a full stroke workup per neuro recommendation  #2. Pleural effusion/Pneumonia. CT chest reveals large left pleural effusion. Antibiotics and Lasix initiated in the emergency department. Oxygen saturation level 89% on non-rebreather. Of note chart review indicates patient has had no effusion with thoracentesis in the past. Currently family indicates no thoracentesis unless patient becomes distressed -continue antibiotics for 1 dose -lasix for one dose -Palliative care consult  #3. Acute on chronic respiratory failure related to above. Patient on home oxygen 2 L. Oxygen saturation level 89% on non-rebreather -Continue nonrebreather -Lasix and antibiotics 1 as noted above -monitor  #4. Atrial fibrillation. Not on anticoagulation. EKG as noted above.rate controlled. Home medications include metoprolol -holding home meds for now  #5. Hypertension. Blood pressure on the low end of normal in the emergency department. Home medications include Lasix and metoprolol -Holding home meds for now  #6. Diabetes. Serum glucose 99. Diet controlled -Will not monitor CBGs at this time  #7. Acute encephalopathy in the setting of advanced dementia. Related to above. -See #1  #8. Chronic systolic diastolic heart failure. Echo in June 2018  reveals EF 45%. Was provided with Lasix in the emergency department. Does not appear overloaded  #9. Dementia. Advanced with behavioral disturbances. Home medications include Ativan and Haldol. Family reports worsening over the last several weeks. -When necessary Ativan   DVT prophylaxis: none Code Status: DNR Family Communication: son and daughter at bedside Disposition Plan: to be determined . Doubt will survive this hospitalization  Consults called: dr Alba Destine neuro palliative care  Admission status: inpatient    Radene Gunning MD [facility]  If 7PM-7AM, please contact night-coverage [email or web address] Password [email or web address]  03/07/2017, 8:01 AM

## 2017-03-07 NOTE — Progress Notes (Signed)
Discharge to: [facility] Anticipated discharge date: 03/07/17 Family notified: Yes, at bedside Transportation by: [facility]  CSW signing off.  Laveda Abbe LCSW [phone]

## 2017-03-07 NOTE — Consult Note (Signed)
[facility]  Received request from [place] with PMT for family interest in [facility]. Chart reviewed and met with family at bedside to complete paperwork for transfer to [facility] today. CSW Kathlee Nations aware. RNCM Kelli aware.   Please send discharge summary to [phone].  RN please call report to [facility] at [phone].  Thank you,  Erling Conte, LCSW [phone]

## 2017-03-07 NOTE — Consult Note (Addendum)
Neurology Consultation  Reason for Consult: stroke Referring Physician: Dr. Wyvonnia Dusky Code status: DNR. Patient has a living will, copy not available at time of encounter. Family (Son) present at the time of encounter   CC: code stroke  History is obtained from: Family  HPI: [name] is a 83 y.o. male with advanced dementia, afib not on anticoagulation due to GI bleed in the past, bladder cancer, hypertension, diabetes, presenting to the emergency room as an acute code stroke for left-sided weakness and left facial droop. The patient went to bed around 830 or 9 PM last night and his usual state of health.  He woke up this morning and could not walk.  He barely walks at home anyway but is able to go from his bed to the urinal.  Upon arrival in the ER, he was noted to have a left facial droop and flaccid left arm and weakness of the left leg. Code stroke was called. He was assessed at the ER and in the CT scanner. Noncontrast CT of the head showed chronic small vessel disease and atrophy.  CT angiogram of the neck did not reveal a large vessel occlusion but the CT perfusion showed a area of ischemia without a core in the right MCA territory posterior division. Patient at baseline is an mRS of 4.  He has been having cognitive deficits over the past year or 2 to the point where he requires 24/7 care at home.  He is unable to perform any ADLs.  He is able to make his needs known.  LKW: 2030 hrs. on 03/06/2017 tpa given?: no, outside the window Premorbid modified Rankin scale (mRS): 4  ROS: Unable to obtain due to altered mental status.   Past Medical History:  Diagnosis Date  . Anemia    iron deficiency secondary to bleeding  . Arrhythmia    a fib  . Arthritis    "all over" (10/28/2016)  . Atrial fibrillation ([place])   . Bladder cancer (HCC)    BCG [name]  . CHF (congestive heart failure) ([place])   . Dementia    "he's had it for a number of years" (10/28/2016)  . ED (erectile  dysfunction)   . Genital warts    recurrent. Not a problem  . GERD (gastroesophageal reflux disease)    treated with PPI  . Hard of hearing   . Hematochezia    has intermittent problem  . History of blood transfusion    "related to low HgB" (10/28/2016)  . History of hiatal hernia    "years ago" (10/28/2016)  . Hypertension    mild  . Irritable bladder   . Memory disorder   . Neuropathy    burning discomfort feet/legs  . On home oxygen therapy    "2L at night" (10/28/2016)  . Pneumonia    "twice" (10/28/2016)  . Restless leg    treats with heat  . Type II diabetes mellitus (HCC)    NIDDM on oral medication    Family History  Problem Relation Age of Onset  . Kidney disease Mother   . Heart disease Mother   . Stroke Mother   . Cancer Father        prostate  . Mental illness Brother   . Diabetes Neg Hx   . COPD Neg Hx   . Heart attack Neg Hx   . Hypertension Neg Hx     Social History:   reports that he quit smoking about 41  years ago. His smoking use included cigarettes. He has a 80.00 pack-year smoking history. He quit smokeless tobacco use about 38 years ago. His smokeless tobacco use included chew. He reports that he does not drink alcohol or use drugs.  Medications  Current Facility-Administered Medications:  .  iopamidol (ISOVUE-370) 76 % injection, , , ,   Current Outpatient Medications:  .  FERREX 150 150 MG capsule, TAKE (1) CAPSULE TWICE DAILY., Disp: 180 capsule, Rfl: 1 .  folic acid (FOLVITE) 1 MG tablet, Take 1 mg by mouth 2 (two) times daily. , Disp: , Rfl:  .  furosemide (LASIX) 40 MG tablet, Take 80 mg daily in the AM except on M-W-F take 80 mg in the AM and 40 mg in the PM., Disp: , Rfl:  .  haloperidol (HALDOL) 0.5 MG tablet, TAKE (1) TABLET DAILY AS NEEDED., Disp: 30 tablet, Rfl: 0 .  hydrOXYzine (ATARAX/VISTARIL) 10 MG tablet, TAKE (1) TABLET TWICE A DAY AS NEEDED., Disp: 180 tablet, Rfl: 0 .  iron polysaccharides (NIFEREX) 150 MG capsule, Take 1  capsule (150 mg total) by mouth 2 (two) times daily., Disp: 180 capsule, Rfl: 1 .  LORazepam (ATIVAN) 0.5 MG tablet, TAKE (1) TABLET TWICE A DAY AS NEEDED., Disp: 60 tablet, Rfl: 2 .  Melatonin 3 MG CAPS, Take by mouth. Takes nightly, Disp: , Rfl:  .  Memantine HCl-Donepezil HCl (NAMZARIC) 28-10 MG CP24, Take 1 capsule every morning by mouth., Disp: 90 capsule, Rfl: 1 .  metoprolol tartrate (LOPRESSOR) 25 MG tablet, Take 0.5 tablets (12.5 mg total) by mouth 2 (two) times daily., Disp: 90 tablet, Rfl: 3 .  mirabegron ER (MYRBETRIQ) 50 MG TB24 tablet, Take 1 tablet (50 mg total) by mouth daily., Disp: 60 tablet, Rfl: 5 .  Multiple Vitamin (MULTIVITAMIN WITH MINERALS) TABS tablet, Take 1 tablet by mouth daily., Disp: , Rfl:  .  Multiple Vitamins-Minerals (PRESERVISION AREDS 2 PO), Take 1 tablet by mouth 2 (two) times daily. , Disp: , Rfl:  .  nitroGLYCERIN (NITROSTAT) 0.4 MG SL tablet, Place 1 tablet (0.4 mg total) under the tongue every 5 (five) minutes as needed for chest pain (x 3 doses daily)., Disp: 30 tablet, Rfl: 3 .  pantoprazole (PROTONIX) 40 MG tablet, TAKE 1 TABLET TWICE DAILY., Disp: 180 tablet, Rfl: 1 .  QUEtiapine (SEROQUEL) 50 MG tablet, Take 1 tablet (50 mg total) by mouth at bedtime., Disp: 30 tablet, Rfl: 0   Exam: Current vital signs: BP 125/76   Pulse 67   Temp 98 F (36.7 C) (Temporal)   Resp 20   SpO2 (!) 89%  Vital signs in last 24 hours: Temp:  [98 F (36.7 C)] 98 F (36.7 C) (12/17 0508) Pulse Rate:  [67] 67 (12/17 0458) Resp:  [20] 20 (12/17 0458) BP: (125)/(76) 125/76 (12/17 0458) SpO2:  [89 %] 89 % (12/17 0458) General: Drowsy, opens eyes to voice, does not follow commands, seems to be in some respiratory distress. HEENT: Normocephalic, atraumatic, dry mucous membranes Lungs: Absent breath sounds on the left Cardiovascular irregularly irregular rhythm, no murmurs Extremities: Warm and well-perfused Neurological exam Drowsy, opens eyes to voice, does not  follow commands. Unable to name or repeat.  Comprehension is impaired. He has some verbal output but speech is unintelligible and severely dysarthric. He seems to be neglecting the left side of his body. Cranial nerves: Pupils equal round reactive to light, gaze is midline but unable to cross towards the left-has right gaze preference, left lower facial  weakness noted Motor exam: Flaccid left upper extremity, some movement in the left lower extremity, right leg also drips vertically down, right upper extremity has full strength. Sensory exam: Loss of sensation to noxious stim on the left. Coordination or gait.  NIHSS 1a Level of Conscious.: 1 1b LOC Questions: 2 1c LOC Commands: 2 2 Best Gaze: 1 3 Visual: 1 4 Facial Palsy: 2 5a Motor Arm - left: 4 5b Motor Arm - Right: 0 6a Motor Leg - Left: 2 6b Motor Leg - Right: 1 7 Limb Ataxia: 0 8 Sensory: 1 9 Best Language: 2 10 Dysarthria: 2 11 Extinct. and Inatten.: 1 TOTAL: 22  Labs I have reviewed labs in epic and the results pertinent to this consultation are: WBC low at 3.0, hemoglobin low at 9.5, CBC    Component Value Date/Time   WBC 3.0 (L) 03/07/2017 0459   RBC 3.68 (L) 03/07/2017 0459   HGB 9.5 (L) 03/07/2017 0513   HGB 9.5 (L) 12/23/2016 1108   HCT 28.0 (L) 03/07/2017 0513   HCT 30.1 (L) 12/23/2016 1108   PLT 146 (L) 03/07/2017 0459   PLT 155 12/23/2016 1108   MCV 79.9 03/07/2017 0459   MCV 79 12/23/2016 1108   MCH 23.6 (L) 03/07/2017 0459   MCHC 29.6 (L) 03/07/2017 0459   RDW 17.6 (H) 03/07/2017 0459   RDW 16.3 (H) 12/23/2016 1108   LYMPHSABS 0.6 (L) 03/07/2017 0459   LYMPHSABS 0.3 (L) 12/23/2016 1108   MONOABS 0.5 03/07/2017 0459   EOSABS 0.2 03/07/2017 0459   EOSABS 0.1 12/23/2016 1108   BASOSABS 0.0 03/07/2017 0459   BASOSABS 0.0 12/23/2016 1108    CMP     Component Value Date/Time   NA 145 03/07/2017 0513   NA 144 12/23/2016 1108   K 3.9 03/07/2017 0513   CL 99 (L) 03/07/2017 0513   CO2 29  01/24/2017 1434   GLUCOSE 99 03/07/2017 0513   BUN 26 (H) 03/07/2017 0513   BUN 21 12/23/2016 1108   CREATININE 1.00 03/07/2017 0513   CREATININE 0.74 03/20/2015 1259   CALCIUM 9.3 01/24/2017 1434   PROT 5.8 (L) 10/28/2016 1527   ALBUMIN 3.2 (L) 10/28/2016 1527   AST 19 10/28/2016 1527   ALT 15 (L) 10/28/2016 1527   ALKPHOS 93 10/28/2016 1527   BILITOT 0.5 10/28/2016 1527   GFRNONAA 70 12/23/2016 1108   GFRAA 81 12/23/2016 1108   Imaging I have reviewed the images obtained:  CT-scan of the brain -noncontrast CT of the head shows chronic white matter changes.  No bleed.  No evidence of acute evolving strokes but a poor quality image because of head positioning and some motion. CT Joetta Manners of the head and neck does not reveal any large vessel occlusion in the head and neck vessels.  CT perfusion study does show an area of decreased time to peak without a core in the right MCA territory and some decreased time to peak in the LT  MCA territory as well-again this is a poor quality study with poor bolus timing. Also upon review by neurorads, right PCA is either severely stenotic or occluded.  Assessment:  83 year old man with advanced dementia, atrial fibrillation not on anticoagulation, bladder cancer, hypertension, diabetes presented to the emergency room as an acute code stroke after he was noted to have left-sided weakness upon waking up this morning. His last known normal was outside the window for IV TPA. The CT angiogram did not show any large vessel occlusion amenable  for endovascular procedure and also he has a poor baseline modified Rankin scale making him ineligible for endovascular procedure. He probably has suffered an embolic stroke to the right MCA territory secondary to atrial fibrillation. He is also encephalopathic on exam other than being hemiparetic on the left and neglecting the left side.  His CT of the head and neck showed complete white out of the left lung.  That  might be also contributing to his current depressed level of mentation along with his underlying dementia.  Impression: Acute ischemic stroke of the right MCA territory secondary to atrial fibrillation Pneumonia/Pleural effusion A. Fib DM  Recommendations: --His [name] was at bedside.  He indicated that the patient has a DNR and a living will that precludes any heroic measures.  He also reported that the patient has been progressively getting worse in terms of his cognition get to the point that he requires 24/7 care. He said his father would not like any procedures done in this situation. --I have suggested that a palliative care consult be obtained for comfort measures/hospice planning only. --If the family decides to go that route, no further neurological recommendations. --If a decision is made to pursue further medical intervention, he will require a full stroke workup as below: -Admit to hospitalist -Telemetry monitoring -Allow for permissive hypertension for the first 24-48h - only treat PRN if SBP >220 mmHg. Blood pressures can be gradually normalized to SBP<140 upon discharge. -MRI of the brain without contrast -Echocardiogram -HgbA1c, fasting lipid panel -Frequent neuro checks -Prophylactic therapy-Antiplatelet med: Aspirin - dose 325mg  PO or 300mg  PR -Atorvastatin 80 mg PO daily -Risk factor modification -PT consult, OT consult, Speech consult  Please page stroke NP/PA/MD (listed on AMION)  from 8am-4 pm as this patient will be followed by the stroke team at this point.  -- Amie Portland, MD [facility] [phone] If 7pm to 7am, please call on call as listed on AMION.  CRITICAL CARE Performed by: Amie Portland Total critical care time: 60 minutes Critical care time was exclusive of separately billable procedures and treating other patients. Critical care was necessary to treat or prevent imminent or life-threatening deterioration. Critical care was time  spent personally by me on the following activities: development of treatment plan with patient and/or surrogate as well as nursing, discussions with consultants, evaluation of patient's response to treatment, examination of patient, obtaining history from patient or surrogate, ordering and performing treatments and interventions, ordering and review of laboratory studies, ordering and review of radiographic studies, pulse oximetry and re-evaluation of patient's condition.

## 2017-03-07 NOTE — ED Triage Notes (Signed)
Patient from home, caregiver called EMS after patient had got up to go to the bathroom and then immediately sat down.  Patient then was unresponsive per caregiver.  Patient was last seen normal last night around 9pm.   Patient does have some ability to walk at home with assistance and does have alzheimer's disease and afib.

## 2017-03-07 NOTE — Discharge Summary (Signed)
Physician Discharge Summary  [name] QIO:[id number] DOB: 08-27-1927 DOA: 03/07/2017  PCP: [name], NP  Admit date: 03/07/2017 Discharge date: 03/07/2017  Time spent: 60 minutes  Recommendations for Outpatient Follow-up:  1. Being discharge to [facility] for comfort care only   Discharge Diagnoses:  Principal Problem:   Acute ischemic stroke [facility]) Active Problems:   Atrial fibrillation ([place])   Diabetes mellitus ([place])   Benign hypertensive heart disease without heart failure   Chronic diastolic heart failure (HCC)   Acute respiratory failure (HCC)   Chronic anemia   Chronic systolic CHF (congestive heart failure) (HCC)   Acute encephalopathy   Pleural effusion   Dementia with behavioral disturbance   RBBB   FTT (failure to thrive) in adult   Volume depletion   Discharge Condition: porr  Diet recommendation: npo  Filed Weights   03/07/17 1300  Weight: 81.5 kg (179 lb 10.8 oz)    History of present illness: Information is obtained from the chart and the family  at the bedside. Son reported patient went to bed around 9:00 last night in his usual state of health. At baseline patient uses a walker with a very unsteady gait, has advanced dementia is not always able to make his wants and needs known and unable to perform ADLs. Son reported at best he is able to transfer to bedside commode with assistance. Son reported he had been "declining rapidly over the last several weeks". They reported decreased oral intake worsening cough. Morning he awakened with obvious left-sided weakness and facial droop. Family denied any recent acute illness no fever chills worsening shortness of breath. No nausea vomiting. No dysuria hematuria frequency or urgency. No diarrhea constipation melena or bright red blood per rectum. Patient has 24-hour care at home and family notes is been getting weaker and weaker.  Hospital Course:  #1. Stroke/left-sided weakness. Evaluated by Dr. Rory Percy  with neurology who opined acute ischemic stroke of right MCA territory secondary to atrial fibrillation and recommended admission with no further stroke workup unless family decided against comfort care.  Family indicates patient is a DO NOT RESUSCITATE and would not like any "procedures". Palliative care consult obtained and comfort care only with transfer to [facility] place arranged.    Procedures:  none  Consultations:  Dr. Hilma Favors Palliative care  Dr. Rory Percy Neuro  Discharge Exam: Vitals:   03/07/17 0800 03/07/17 0928  BP: (!) 104/43 102/75  Pulse: 63 78  Resp: 13 18  Temp:  98 F (36.7 C)  SpO2: 100% 98%    General: thin frail pale Cardiovascular: irregularly irregular. No MGR No LE edeam Respiratory: no increased work of breathing. resp shallow. BS absent on left. Faint on right  Discharge Instructions   Discharge Instructions    Diet - low sodium heart healthy   Complete by:  As directed    Increase activity slowly   Complete by:  As directed      Allergies as of 03/07/2017      Reactions   Cephalexin Other (See Comments)   Unknown reaction   Furacin [nitrofurazone] Other (See Comments)   Unknown reaction   Macrodantin Other (See Comments)   Unknown reaction   Zestril [lisinopril] Other (See Comments)   Unknown reaction      Medication List    STOP taking these medications   acetaminophen 500 MG tablet Commonly known as:  TYLENOL Replaced by:  acetaminophen 650 MG suppository   FERREX 150 150 MG capsule Generic drug:  iron polysaccharides   folic acid 1 MG tablet Commonly known as:  FOLVITE   furosemide 40 MG tablet Commonly known as:  LASIX   hydrOXYzine 10 MG tablet Commonly known as:  ATARAX/VISTARIL   iron polysaccharides 150 MG capsule Commonly known as:  NIFEREX   Memantine HCl-Donepezil HCl 28-10 MG Cp24 Commonly known as:  NAMZARIC   multivitamin with minerals Tabs tablet   pantoprazole 40 MG tablet Commonly known as:   PROTONIX   PRESERVISION AREDS 2 PO     TAKE these medications   acetaminophen 650 MG suppository Commonly known as:  TYLENOL Place 1 suppository (650 mg total) rectally every 6 (six) hours as needed for fever. Replaces:  acetaminophen 500 MG tablet   haloperidol 0.5 MG tablet Commonly known as:  HALDOL TAKE (1) TABLET DAILY AS NEEDED. What changed:  See the new instructions.   LORazepam 0.5 MG tablet Commonly known as:  ATIVAN TAKE (1) TABLET TWICE A DAY AS NEEDED. What changed:  See the new instructions.   metoprolol tartrate 25 MG tablet Commonly known as:  LOPRESSOR Take 0.5 tablets (12.5 mg total) by mouth 2 (two) times daily.   mirabegron ER 50 MG Tb24 tablet Commonly known as:  MYRBETRIQ Take 1 tablet (50 mg total) by mouth daily. What changed:  when to take this   nitroGLYCERIN 0.4 MG SL tablet Commonly known as:  NITROSTAT Place 1 tablet (0.4 mg total) under the tongue every 5 (five) minutes as needed for chest pain (x 3 doses daily).   OXYGEN Inhale 2 L into the lungs as needed (low sats).   QUEtiapine 50 MG tablet Commonly known as:  SEROQUEL Take 1 tablet (50 mg total) by mouth at bedtime.      Allergies  Allergen Reactions  . Cephalexin Other (See Comments)    Unknown reaction  . Furacin [Nitrofurazone] Other (See Comments)    Unknown reaction  . Macrodantin Other (See Comments)    Unknown reaction  . Zestril [Lisinopril] Other (See Comments)    Unknown reaction      The results of significant diagnostics from this hospitalization (including imaging, microbiology, ancillary and laboratory) are listed below for reference.    Significant Diagnostic Studies: Ct Angio Head W Or Wo Contrast  Result Date: 03/07/2017 CLINICAL DATA:  Initial evaluation for acute unresponsiveness. Left-sided weakness. EXAM: CT ANGIOGRAPHY HEAD AND NECK CT PERFUSION BRAIN TECHNIQUE: Multidetector CT imaging of the head and neck was performed using the standard protocol  during bolus administration of intravenous contrast. Multiplanar CT image reconstructions and MIPs were obtained to evaluate the vascular anatomy. Carotid stenosis measurements (when applicable) are obtained utilizing NASCET criteria, using the distal internal carotid diameter as the denominator. Multiphase CT imaging of the brain was performed following IV bolus contrast injection. Subsequent parametric perfusion maps were calculated using RAPID software. CONTRAST:  79mL ISOVUE-370 IOPAMIDOL (ISOVUE-370) INJECTION 76% COMPARISON:  Prior head CT from earlier same day. FINDINGS: CTA NECK FINDINGS Aortic arch: Examination markedly limited due to contrast bolus and poor cardiac output. The visualized aortic arch is normal in caliber with normal branch pattern. Atheromatous plaque about the aortic arch and proximal great vessels without flow-limiting stenosis. Visualized subclavian artery is patent. Right carotid system: Right common carotid artery patent from its origin to the bifurcation without stenosis. No significant atheromatous narrowing about the right bifurcation. Right ICA patent to the skullbase without stenosis, dissection, or occlusion. Left carotid system: Left common carotid artery patent from its origin to the bifurcation  without stenosis. No significant atheromatous narrowing about the left bifurcation. Left ICA patent from the bifurcation to the skullbase without flow-limiting stenosis, dissection, or occlusion. Vertebral arteries: Both of the vertebral artery is arise from the subclavian arteries. Left vertebral artery dominant. Left vertebral artery grossly patent within the neck without obvious stenosis. Right vertebral artery diffusely hypoplastic and not well visualized, but grossly patent as well without occlusion. Skeleton: No obvious acute osseous abnormality, although evaluation limited by positioning. No discrete lytic or blastic osseous lesions. Advanced degenerative spondylolysis and facet  arthrosis present within the cervical spine. Mild compression deformity at the superior endplate of T5 favored to be chronic. Other neck: No acute soft tissue abnormality within the neck. No adenopathy. Thyroid grossly normal. Upper chest: Upper mediastinum demonstrates no acute abnormality. Severe cardiomegaly partially visualized. Large left pleural effusion with compressive atelectasis, with only a small portion of the upper left lung aerated. Moderate layering right pleural effusion. Underlying emphysema. Review of the MIP images confirms the above findings CTA HEAD FINDINGS Anterior circulation: Examination of the intracranial circulation markedly limited by poor contrast bolus and low cardiac output. Petrous segments patent bilaterally. Extensive atheromatous plaque throughout the cavernous ICAs with moderate diffuse narrowing. ICA termini are patent bilaterally. A1 segments patent. Grossly normal anterior communicating artery. Anterior cerebral artery is patent proximally, not well evaluated distally due to contrast bolus. M1 segments patent without stenosis or occlusion. No proximal M2 occlusion. Right MCA branches fairly well perfused. There is decreased perfusion of the left MCA branches as compared to the right, which may be related cardiac output. No obvious flow-limiting stenosis or vascular occlusion. Posterior circulation: Dominant left vertebral artery patent to the vertebrobasilar junction without stenosis. Diminutive right vertebral artery not well visualize, and may include prior to the vertebrobasilar junction. Posterior inferior cerebral arteries not well evaluated. Basilar artery patent to its distal aspect without stenosis or occlusion. Superior cerebral arteries patent proximally. Posterior cerebral artery is grossly patent proximally, not well evaluated distally. In particular, the right PCA is extremely poorly visualized distally, which may be due to a 80 high-grade stenosis. Scant flow is  seen distally within the right PCA territory. Probable fetal type origin of the left PCA. Venous sinuses: Patent. Anatomic variants: Fetal type left PCA. No obvious intracranial aneurysm. Delayed phase: Not performed. Review of the MIP images confirms the above findings CT Brain Perfusion Findings: CBF (<30%) Volume: 88mL Perfusion (Tmax>6.0s) volume: 65mL Mismatch Volume: 03/08/1980mL Infarction Location:Delayed perfusion within knee posterior right MCA/right MCA PCA watershed territory. No frank core infarct. IMPRESSION: 1. Extremely limited study demonstrating no obvious large vessel occlusion. 2. Delayed perfusion within the posterior right MCA territory/right MCA/PCA watershed region. No core infarct. 3. Poor visualization of the distal right PCA, suspected to be related to poor contrast bolus with probable proximal high-grade stenosis. 4. Poor visualization of the distal left MCA branches, also suspected to be related poor cardiac output in contrast bolus and probable fetal type left PCA. No obvious focal proximal stenosis identified. 5. Large left pleural effusion with associated compressive atelectasis of the left lung. Additional moderate layering right pleural effusion. Underlying emphysema. 6. Advanced degenerative spondylolysis and facet arthrosis within cervical spine. Critical Value/emergent results were called by telephone at the time of interpretation on 03/07/2017 at 5:55 am to Dr. Amie Portland , who verbally acknowledged these results. Electronically Signed   By: Jeannine Boga M.D.   On: 03/07/2017 06:22   Ct Angio Neck W Or Wo Contrast  Result Date: 03/07/2017  CLINICAL DATA:  Initial evaluation for acute unresponsiveness. Left-sided weakness. EXAM: CT ANGIOGRAPHY HEAD AND NECK CT PERFUSION BRAIN TECHNIQUE: Multidetector CT imaging of the head and neck was performed using the standard protocol during bolus administration of intravenous contrast. Multiplanar CT image reconstructions and  MIPs were obtained to evaluate the vascular anatomy. Carotid stenosis measurements (when applicable) are obtained utilizing NASCET criteria, using the distal internal carotid diameter as the denominator. Multiphase CT imaging of the brain was performed following IV bolus contrast injection. Subsequent parametric perfusion maps were calculated using RAPID software. CONTRAST:  48mL ISOVUE-370 IOPAMIDOL (ISOVUE-370) INJECTION 76% COMPARISON:  Prior head CT from earlier same day. FINDINGS: CTA NECK FINDINGS Aortic arch: Examination markedly limited due to contrast bolus and poor cardiac output. The visualized aortic arch is normal in caliber with normal branch pattern. Atheromatous plaque about the aortic arch and proximal great vessels without flow-limiting stenosis. Visualized subclavian artery is patent. Right carotid system: Right common carotid artery patent from its origin to the bifurcation without stenosis. No significant atheromatous narrowing about the right bifurcation. Right ICA patent to the skullbase without stenosis, dissection, or occlusion. Left carotid system: Left common carotid artery patent from its origin to the bifurcation without stenosis. No significant atheromatous narrowing about the left bifurcation. Left ICA patent from the bifurcation to the skullbase without flow-limiting stenosis, dissection, or occlusion. Vertebral arteries: Both of the vertebral artery is arise from the subclavian arteries. Left vertebral artery dominant. Left vertebral artery grossly patent within the neck without obvious stenosis. Right vertebral artery diffusely hypoplastic and not well visualized, but grossly patent as well without occlusion. Skeleton: No obvious acute osseous abnormality, although evaluation limited by positioning. No discrete lytic or blastic osseous lesions. Advanced degenerative spondylolysis and facet arthrosis present within the cervical spine. Mild compression deformity at the superior  endplate of T5 favored to be chronic. Other neck: No acute soft tissue abnormality within the neck. No adenopathy. Thyroid grossly normal. Upper chest: Upper mediastinum demonstrates no acute abnormality. Severe cardiomegaly partially visualized. Large left pleural effusion with compressive atelectasis, with only a small portion of the upper left lung aerated. Moderate layering right pleural effusion. Underlying emphysema. Review of the MIP images confirms the above findings CTA HEAD FINDINGS Anterior circulation: Examination of the intracranial circulation markedly limited by poor contrast bolus and low cardiac output. Petrous segments patent bilaterally. Extensive atheromatous plaque throughout the cavernous ICAs with moderate diffuse narrowing. ICA termini are patent bilaterally. A1 segments patent. Grossly normal anterior communicating artery. Anterior cerebral artery is patent proximally, not well evaluated distally due to contrast bolus. M1 segments patent without stenosis or occlusion. No proximal M2 occlusion. Right MCA branches fairly well perfused. There is decreased perfusion of the left MCA branches as compared to the right, which may be related cardiac output. No obvious flow-limiting stenosis or vascular occlusion. Posterior circulation: Dominant left vertebral artery patent to the vertebrobasilar junction without stenosis. Diminutive right vertebral artery not well visualize, and may include prior to the vertebrobasilar junction. Posterior inferior cerebral arteries not well evaluated. Basilar artery patent to its distal aspect without stenosis or occlusion. Superior cerebral arteries patent proximally. Posterior cerebral artery is grossly patent proximally, not well evaluated distally. In particular, the right PCA is extremely poorly visualized distally, which may be due to a 80 high-grade stenosis. Scant flow is seen distally within the right PCA territory. Probable fetal type origin of the left  PCA. Venous sinuses: Patent. Anatomic variants: Fetal type left PCA. No obvious intracranial aneurysm.  Delayed phase: Not performed. Review of the MIP images confirms the above findings CT Brain Perfusion Findings: CBF (<30%) Volume: 63mL Perfusion (Tmax>6.0s) volume: 38mL Mismatch Volume: 03/08/1980mL Infarction Location:Delayed perfusion within knee posterior right MCA/right MCA PCA watershed territory. No frank core infarct. IMPRESSION: 1. Extremely limited study demonstrating no obvious large vessel occlusion. 2. Delayed perfusion within the posterior right MCA territory/right MCA/PCA watershed region. No core infarct. 3. Poor visualization of the distal right PCA, suspected to be related to poor contrast bolus with probable proximal high-grade stenosis. 4. Poor visualization of the distal left MCA branches, also suspected to be related poor cardiac output in contrast bolus and probable fetal type left PCA. No obvious focal proximal stenosis identified. 5. Large left pleural effusion with associated compressive atelectasis of the left lung. Additional moderate layering right pleural effusion. Underlying emphysema. 6. Advanced degenerative spondylolysis and facet arthrosis within cervical spine. Critical Value/emergent results were called by telephone at the time of interpretation on 03/07/2017 at 5:55 am to Dr. Amie Portland , who verbally acknowledged these results. Electronically Signed   By: Jeannine Boga M.D.   On: 03/07/2017 06:22   Ct Cerebral Perfusion W Contrast  Result Date: 03/07/2017 CLINICAL DATA:  Initial evaluation for acute unresponsiveness. Left-sided weakness. EXAM: CT ANGIOGRAPHY HEAD AND NECK CT PERFUSION BRAIN TECHNIQUE: Multidetector CT imaging of the head and neck was performed using the standard protocol during bolus administration of intravenous contrast. Multiplanar CT image reconstructions and MIPs were obtained to evaluate the vascular anatomy. Carotid stenosis measurements  (when applicable) are obtained utilizing NASCET criteria, using the distal internal carotid diameter as the denominator. Multiphase CT imaging of the brain was performed following IV bolus contrast injection. Subsequent parametric perfusion maps were calculated using RAPID software. CONTRAST:  14mL ISOVUE-370 IOPAMIDOL (ISOVUE-370) INJECTION 76% COMPARISON:  Prior head CT from earlier same day. FINDINGS: CTA NECK FINDINGS Aortic arch: Examination markedly limited due to contrast bolus and poor cardiac output. The visualized aortic arch is normal in caliber with normal branch pattern. Atheromatous plaque about the aortic arch and proximal great vessels without flow-limiting stenosis. Visualized subclavian artery is patent. Right carotid system: Right common carotid artery patent from its origin to the bifurcation without stenosis. No significant atheromatous narrowing about the right bifurcation. Right ICA patent to the skullbase without stenosis, dissection, or occlusion. Left carotid system: Left common carotid artery patent from its origin to the bifurcation without stenosis. No significant atheromatous narrowing about the left bifurcation. Left ICA patent from the bifurcation to the skullbase without flow-limiting stenosis, dissection, or occlusion. Vertebral arteries: Both of the vertebral artery is arise from the subclavian arteries. Left vertebral artery dominant. Left vertebral artery grossly patent within the neck without obvious stenosis. Right vertebral artery diffusely hypoplastic and not well visualized, but grossly patent as well without occlusion. Skeleton: No obvious acute osseous abnormality, although evaluation limited by positioning. No discrete lytic or blastic osseous lesions. Advanced degenerative spondylolysis and facet arthrosis present within the cervical spine. Mild compression deformity at the superior endplate of T5 favored to be chronic. Other neck: No acute soft tissue abnormality within  the neck. No adenopathy. Thyroid grossly normal. Upper chest: Upper mediastinum demonstrates no acute abnormality. Severe cardiomegaly partially visualized. Large left pleural effusion with compressive atelectasis, with only a small portion of the upper left lung aerated. Moderate layering right pleural effusion. Underlying emphysema. Review of the MIP images confirms the above findings CTA HEAD FINDINGS Anterior circulation: Examination of the intracranial circulation markedly limited by  poor contrast bolus and low cardiac output. Petrous segments patent bilaterally. Extensive atheromatous plaque throughout the cavernous ICAs with moderate diffuse narrowing. ICA termini are patent bilaterally. A1 segments patent. Grossly normal anterior communicating artery. Anterior cerebral artery is patent proximally, not well evaluated distally due to contrast bolus. M1 segments patent without stenosis or occlusion. No proximal M2 occlusion. Right MCA branches fairly well perfused. There is decreased perfusion of the left MCA branches as compared to the right, which may be related cardiac output. No obvious flow-limiting stenosis or vascular occlusion. Posterior circulation: Dominant left vertebral artery patent to the vertebrobasilar junction without stenosis. Diminutive right vertebral artery not well visualize, and may include prior to the vertebrobasilar junction. Posterior inferior cerebral arteries not well evaluated. Basilar artery patent to its distal aspect without stenosis or occlusion. Superior cerebral arteries patent proximally. Posterior cerebral artery is grossly patent proximally, not well evaluated distally. In particular, the right PCA is extremely poorly visualized distally, which may be due to a 80 high-grade stenosis. Scant flow is seen distally within the right PCA territory. Probable fetal type origin of the left PCA. Venous sinuses: Patent. Anatomic variants: Fetal type left PCA. No obvious intracranial  aneurysm. Delayed phase: Not performed. Review of the MIP images confirms the above findings CT Brain Perfusion Findings: CBF (<30%) Volume: 87mL Perfusion (Tmax>6.0s) volume: 86mL Mismatch Volume: 03/08/1980mL Infarction Location:Delayed perfusion within knee posterior right MCA/right MCA PCA watershed territory. No frank core infarct. IMPRESSION: 1. Extremely limited study demonstrating no obvious large vessel occlusion. 2. Delayed perfusion within the posterior right MCA territory/right MCA/PCA watershed region. No core infarct. 3. Poor visualization of the distal right PCA, suspected to be related to poor contrast bolus with probable proximal high-grade stenosis. 4. Poor visualization of the distal left MCA branches, also suspected to be related poor cardiac output in contrast bolus and probable fetal type left PCA. No obvious focal proximal stenosis identified. 5. Large left pleural effusion with associated compressive atelectasis of the left lung. Additional moderate layering right pleural effusion. Underlying emphysema. 6. Advanced degenerative spondylolysis and facet arthrosis within cervical spine. Critical Value/emergent results were called by telephone at the time of interpretation on 03/07/2017 at 5:55 am to Dr. Amie Portland , who verbally acknowledged these results. Electronically Signed   By: Jeannine Boga M.D.   On: 03/07/2017 06:22   Dg Chest Portable 1 View  Result Date: 03/07/2017 CLINICAL DATA:  Altered mental status. EXAM: PORTABLE CHEST 1 VIEW COMPARISON:  01/12/2017 FINDINGS: Large left pleural effusion occupying the lower 2/3 of left hemithorax, increased from prior exam. Cardiomegaly again seen. Increased interstitial opacities suspicious for pulmonary edema. No confluent airspace disease in the right lung. Patient's chin obscures the lung apices. IMPRESSION: 1. Large left pleural effusion occupying the lower 2/3 of left hemithorax. 2. Cardiomegaly. Interstitial opacities  suspicious for pulmonary edema. Electronically Signed   By: Jeb Levering M.D.   On: 03/07/2017 06:25   Ct Head Code Stroke Wo Contrast  Result Date: 03/07/2017 CLINICAL DATA:  Code stroke. Initial evaluation for acute facial weakness. EXAM: CT HEAD WITHOUT CONTRAST TECHNIQUE: Contiguous axial images were obtained from the base of the skull through the vertex without intravenous contrast. COMPARISON:  Prior CT from 11/12/2015. FINDINGS: Brain: Atrophy with chronic microvascular ischemic disease. No acute intracranial hemorrhage. No acute large vessel territory infarct. No mass lesion or midline shift. No hydrocephalus. No extra-axial fluid collection. Vascular: No hyperdense vessel. Scattered vascular calcifications noted within the carotid siphons. Skull: Scalp soft tissues  and calvarium within normal limits. Sinuses/Orbits: Globes and orbital soft tissues within normal limits. Mild scattered mucosal thickening within the ethmoidal air cells. Chronic right sphenoid sinusitis. Paranasal sinuses otherwise clear. Left mastoid effusion. Other: None. ASPECTS [facility] Stroke Program Early CT Score) - Ganglionic level infarction (caudate, lentiform nuclei, internal capsule, insula, M1-M3 cortex): 7 - Supraganglionic infarction (M4-M6 cortex): 3 Total score (0-10 with 10 being normal): 10 IMPRESSION: 1. No acute intracranial infarct or other process. 2. ASPECTS is 10. 3. Moderately advanced cerebral atrophy with chronic small vessel ischemic disease. Critical Value/emergent results were called by telephone at the time of interpretation on 03/07/2017 at 5:46 am to Dr. Rory Percy , who verbally acknowledged these results. Electronically Signed   By: Jeannine Boga M.D.   On: 03/07/2017 05:47    Microbiology: No results found for this or any previous visit (from the past 240 hour(s)).   Labs: Basic Metabolic Panel: Recent Labs  Lab 03/07/17 0459 03/07/17 0513  NA 142 145  K 3.8 3.9  CL 103 99*  CO2 30   --   GLUCOSE 95 99  BUN 24* 26*  CREATININE 1.01 1.00  CALCIUM 8.7*  --    Liver Function Tests: Recent Labs  Lab 03/07/17 0459  AST 18  ALT 12*  ALKPHOS 117  BILITOT 0.7  PROT 6.5  ALBUMIN 3.3*   No results for input(s): LIPASE, AMYLASE in the last 168 hours. No results for input(s): AMMONIA in the last 168 hours. CBC: Recent Labs  Lab 03/07/17 0459 03/07/17 0513  WBC 3.0*  --   NEUTROABS 1.8  --   HGB 8.7* 9.5*  HCT 29.4* 28.0*  MCV 79.9  --   PLT 146*  --    Cardiac Enzymes: No results for input(s): CKTOTAL, CKMB, CKMBINDEX, TROPONINI in the last 168 hours. BNP: BNP (last 3 results) Recent Labs    03/07/17 0458  BNP 144.6*    ProBNP (last 3 results) Recent Labs    04/26/16 1519 08/25/16 1510 02/07/17 1632  PROBNP 1,905* 1,266* 173.0*    CBG: Recent Labs  Lab 03/07/17 0502  GLUCAP 95       Signed:  Radene Gunning MD.  [facility] 03/07/2017, 1:51 PM

## 2017-03-07 NOTE — ED Provider Notes (Signed)
[facility]PART[facility] Provider Note   CSN: [id number] Arrival date & time: 03/07/17  0455     History   Chief Complaint Chief Complaint  Patient presents with  . Altered Mental Status  . Code Stroke    HPI [name] is a 83 y.o. male.  Level 5 caveat for altered mental status.  Patient from home with sudden change in mental status and left-sided weakness.  Last seen normal at 9 PM.  Patient does have dementia at baseline.  Per report, patient's caregiver got him up to go to the bathroom when mental status change was noted with difficulty breathing.  Patient unable to contribute to the history.  He is moving his right side more than his left. Code stroke was activated on arrival as patient is in a large vessel occlusion window.   The history is provided by the patient and the EMS personnel. The history is limited by the condition of the patient.  Altered Mental Status      Past Medical History:  Diagnosis Date  . Anemia    iron deficiency secondary to bleeding  . Arrhythmia    a fib  . Arthritis    "all over" (10/28/2016)  . Atrial fibrillation ([place])   . Bladder cancer (HCC)    BCG [name]  . CHF (congestive heart failure) ([place])   . Dementia    "he's had it for a number of years" (10/28/2016)  . ED (erectile dysfunction)   . Genital warts    recurrent. Not a problem  . GERD (gastroesophageal reflux disease)    treated with PPI  . Hard of hearing   . Hematochezia    has intermittent problem  . History of blood transfusion    "related to low HgB" (10/28/2016)  . History of hiatal hernia    "years ago" (10/28/2016)  . Hypertension    mild  . Irritable bladder   . Memory disorder   . Neuropathy    burning discomfort feet/legs  . On home oxygen therapy    "2L at night" (10/28/2016)  . Pneumonia    "twice" (10/28/2016)  . Restless leg    treats with heat  . Type II diabetes mellitus (HCC)    NIDDM on oral medication     Patient Active Problem List   Diagnosis Date Noted  . Melena   . Symptomatic anemia   . Palliative care encounter   . GI bleed 10/28/2016  . Hypertension 10/28/2016  . (HFpEF) heart failure with preserved ejection fraction ([place]) 10/16/2016  . RBBB 10/16/2016  . Dementia with behavioral disturbance 08/20/2015  . Pleural effusion 02/26/2015  . S/P thoracentesis   . Acute pericarditis   . Pericardial effusion without cardiac tamponade   . Pericardial effusion with cardiac tamponade 01/12/2015  . Atrial fibrillation with RVR ([place])   . Prolonged Q-T interval on ECG 01/10/2015  . Chronic anemia 01/10/2015  . Chronic systolic CHF (congestive heart failure) ([place]) 01/10/2015  . Acute encephalopathy 01/10/2015  . Anemia, iron deficiency   . Cough 07/09/2014  . Allergic rhinitis 07/09/2014  . Fatigue 07/09/2014  . Chronic diastolic heart failure ([place]) 12/18/2013  . Abnormality of gait 05/28/2013  . Chronic venous insufficiency 04/04/2013  . Mitral regurgitation 08/26/2011  . Heart murmur 04/15/2011  . Atrial fibrillation ([place]) 09/01/2010  . Iron deficiency anemia due to chronic blood loss 09/01/2010  . Diabetes mellitus ([place]) 09/01/2010  . Benign hypertensive heart disease without heart  failure 09/01/2010    Past Surgical History:  Procedure Laterality Date  . APPENDECTOMY  1946  . CARDIAC CATHETERIZATION N/A 01/13/2015   Procedure: Pericardiocentesis;  Surgeon: Leonie Man, MD;  Location: [facility];  Service: Cardiovascular;  Laterality: N/A;  . CARDIOVASCULAR STRESS TEST  03/28/2003   EF 55%.  Normal cardiolite study  . JOINT REPLACEMENT  1990's   left TKR  . KNEE ARTHROSCOPY Right 1980's  . TONSILLECTOMY  1938       Home Medications    Prior to Admission medications   Medication Sig Start Date End Date Taking? Authorizing Provider  FERREX 150 150 MG capsule TAKE (1) CAPSULE TWICE DAILY. 12/24/16   Nafziger, Tommi Rumps, NP  folic acid (FOLVITE) 1 MG tablet  Take 1 mg by mouth 2 (two) times daily.     [provider]  furosemide (LASIX) 40 MG tablet Take 80 mg daily in the AM except on M-W-F take 80 mg in the AM and 40 mg in the PM. 12/23/16   End, Harrell Gave, MD  haloperidol (HALDOL) 0.5 MG tablet TAKE (1) TABLET DAILY AS NEEDED. 02/23/17   Nafziger, Tommi Rumps, NP  hydrOXYzine (ATARAX/VISTARIL) 10 MG tablet TAKE (1) TABLET TWICE A DAY AS NEEDED. 12/23/16   Nafziger, Tommi Rumps, NP  iron polysaccharides (NIFEREX) 150 MG capsule Take 1 capsule (150 mg total) by mouth 2 (two) times daily. 12/24/16 03/24/17  Nafziger, Tommi Rumps, NP  LORazepam (ATIVAN) 0.5 MG tablet TAKE (1) TABLET TWICE A DAY AS NEEDED. 12/23/16   [name], NP  Melatonin 3 MG CAPS Take by mouth. Takes nightly    [provider]  Memantine HCl-Donepezil HCl [facility]) 28-10 MG CP24 Take 1 capsule every morning by mouth. 01/31/17   Marcial Pacas, MD  metoprolol tartrate (LOPRESSOR) 25 MG tablet Take 0.5 tablets (12.5 mg total) by mouth 2 (two) times daily. 08/25/16   Eileen Stanford, PA-C  mirabegron ER (MYRBETRIQ) 50 MG TB24 tablet Take 1 tablet (50 mg total) by mouth daily. 10/28/16   Nafziger, Tommi Rumps, NP  Multiple Vitamin (MULTIVITAMIN WITH MINERALS) TABS tablet Take 1 tablet by mouth daily.    [provider]  Multiple Vitamins-Minerals (PRESERVISION AREDS 2 PO) Take 1 tablet by mouth 2 (two) times daily.     [provider]  nitroGLYCERIN (NITROSTAT) 0.4 MG SL tablet Place 1 tablet (0.4 mg total) under the tongue every 5 (five) minutes as needed for chest pain (x 3 doses daily). 01/07/16   Nafziger, Tommi Rumps, NP  pantoprazole (PROTONIX) 40 MG tablet TAKE 1 TABLET TWICE DAILY. 12/24/16   Nafziger, Tommi Rumps, NP  QUEtiapine (SEROQUEL) 50 MG tablet Take 1 tablet (50 mg total) by mouth at bedtime. 03/04/17 04/03/17  [name], NP    Family History Family History  Problem Relation Age of Onset  . Kidney disease Mother   . Heart disease Mother   . Stroke Mother   . Cancer  Father        prostate  . Mental illness Brother   . Diabetes Neg Hx   . COPD Neg Hx   . Heart attack Neg Hx   . Hypertension Neg Hx     Social History Social History   Tobacco Use  . Smoking status: Former Smoker    Packs/day: 2.00    Years: 40.00    Pack years: 80.00    Types: Cigarettes    Last attempt to quit: 08/28/1975    Years since quitting: 41.5  . Smokeless tobacco: Former  User    Types: [name]    Quit date: 1980  Substance Use Topics  . Alcohol use: No  . Drug use: No     Allergies   Cephalexin; Furacin [nitrofurazone]; Macrodantin; and Zestril [lisinopril]   Review of Systems Review of Systems  Unable to perform ROS: Mental status change     Physical Exam Updated Vital Signs BP 125/76   Pulse 67   Temp 98 F (36.7 C) (Temporal)   Resp 20   SpO2 (!) 89%   Physical Exam  Constitutional: He appears well-developed and well-nourished. No distress.  Patient with increased work of breathing, Mostly nonverbal, moans, follows commands on the right.  HENT:  Head: Normocephalic and atraumatic.  Mouth/Throat: Oropharynx is clear and moist. No oropharyngeal exudate.  Dry mucous membranes with left facial droop  Eyes: Conjunctivae and EOM are normal. Pupils are equal, round, and reactive to light.  Neck: Normal range of motion. Neck supple.  No meningismus.  Cardiovascular: Normal rate, normal heart sounds and intact distal pulses.  No murmur heard. Irregular rhythm  Pulmonary/Chest: He is in respiratory distress. He has rales.  Absent breath sounds on the left with minimal air movement. Increased work of breathing with accessory muscle use  Abdominal: Soft. There is no tenderness. There is no rebound and no guarding.  Musculoskeletal: Normal range of motion. He exhibits no edema or tenderness.  Neurological: He is alert. No cranial nerve deficit. He exhibits normal muscle tone. Coordination normal.  Patient follows some commands on the right.  Able to  hold up right arm and right leg against gravity.  Unable to hold up left arm.  Decreased grip strength on left.  Left-sided facial droop.  Tongue is midline.  Some weakness and holding left leg against gravity.  Skin: Skin is warm.  Psychiatric: He has a normal mood and affect. His behavior is normal.  Nursing note and vitals reviewed.  Post  ED Treatments / Results  Labs (all labs ordered are listed, but only abnormal results are displayed) Labs Reviewed  PROTIME-INR - Abnormal; Notable for the following components:      Result Value   Prothrombin Time 15.5 (*)    All other components within normal limits  CBC - Abnormal; Notable for the following components:   WBC 3.0 (*)    RBC 3.68 (*)    Hemoglobin 8.7 (*)    HCT 29.4 (*)    MCH 23.6 (*)    MCHC 29.6 (*)    RDW 17.6 (*)    Platelets 146 (*)    All other components within normal limits  DIFFERENTIAL - Abnormal; Notable for the following components:   Lymphs Abs 0.6 (*)    All other components within normal limits  I-STAT CHEM 8, ED - Abnormal; Notable for the following components:   Chloride 99 (*)    BUN 26 (*)    Calcium, Ion 1.11 (*)    TCO2 35 (*)    Hemoglobin 9.5 (*)    HCT 28.0 (*)    All other components within normal limits  CULTURE, BLOOD (ROUTINE X 2)  CULTURE, BLOOD (ROUTINE X 2)  ETHANOL  APTT  COMPREHENSIVE METABOLIC PANEL  RAPID URINE DRUG SCREEN, HOSP PERFORMED  URINALYSIS, ROUTINE W REFLEX MICROSCOPIC  BRAIN NATRIURETIC PEPTIDE  TROPONIN I  I-STAT TROPONIN, ED  CBG MONITORING, ED  I-STAT CG4 LACTIC ACID, ED    EKG  EKG Interpretation  Date/Time:  Monday March 07 2017 05:05:09 EST Ventricular  Rate:  77 PR Interval:    QRS Duration: 138 QT Interval:  488 QTC Calculation: 535 R Axis:   55 Text Interpretation:  Atrial fibrillation Ventricular premature complex Nonspecific intraventricular conduction delay Repol abnrm suggests ischemia, diffuse leads Borderline ST elevation, lateral leads  No significant change was found Artifact Confirmed by Ezequiel Essex [phone]) on 03/07/2017 6:33:22 AM       Radiology Ct Angio Head W Or Wo Contrast  Result Date: 03/07/2017 CLINICAL DATA:  Initial evaluation for acute unresponsiveness. Left-sided weakness. EXAM: CT ANGIOGRAPHY HEAD AND NECK CT PERFUSION BRAIN TECHNIQUE: Multidetector CT imaging of the head and neck was performed using the standard protocol during bolus administration of intravenous contrast. Multiplanar CT image reconstructions and MIPs were obtained to evaluate the vascular anatomy. Carotid stenosis measurements (when applicable) are obtained utilizing NASCET criteria, using the distal internal carotid diameter as the denominator. Multiphase CT imaging of the brain was performed following IV bolus contrast injection. Subsequent parametric perfusion maps were calculated using RAPID software. CONTRAST:  18mL ISOVUE-370 IOPAMIDOL (ISOVUE-370) INJECTION 76% COMPARISON:  Prior head CT from earlier same day. FINDINGS: CTA NECK FINDINGS Aortic arch: Examination markedly limited due to contrast bolus and poor cardiac output. The visualized aortic arch is normal in caliber with normal branch pattern. Atheromatous plaque about the aortic arch and proximal great vessels without flow-limiting stenosis. Visualized subclavian artery is patent. Right carotid system: Right common carotid artery patent from its origin to the bifurcation without stenosis. No significant atheromatous narrowing about the right bifurcation. Right ICA patent to the skullbase without stenosis, dissection, or occlusion. Left carotid system: Left common carotid artery patent from its origin to the bifurcation without stenosis. No significant atheromatous narrowing about the left bifurcation. Left ICA patent from the bifurcation to the skullbase without flow-limiting stenosis, dissection, or occlusion. Vertebral arteries: Both of the vertebral artery is arise from the subclavian  arteries. Left vertebral artery dominant. Left vertebral artery grossly patent within the neck without obvious stenosis. Right vertebral artery diffusely hypoplastic and not well visualized, but grossly patent as well without occlusion. Skeleton: No obvious acute osseous abnormality, although evaluation limited by positioning. No discrete lytic or blastic osseous lesions. Advanced degenerative spondylolysis and facet arthrosis present within the cervical spine. Mild compression deformity at the superior endplate of T5 favored to be chronic. Other neck: No acute soft tissue abnormality within the neck. No adenopathy. Thyroid grossly normal. Upper chest: Upper mediastinum demonstrates no acute abnormality. Severe cardiomegaly partially visualized. Large left pleural effusion with compressive atelectasis, with only a small portion of the upper left lung aerated. Moderate layering right pleural effusion. Underlying emphysema. Review of the MIP images confirms the above findings CTA HEAD FINDINGS Anterior circulation: Examination of the intracranial circulation markedly limited by poor contrast bolus and low cardiac output. Petrous segments patent bilaterally. Extensive atheromatous plaque throughout the cavernous ICAs with moderate diffuse narrowing. ICA termini are patent bilaterally. A1 segments patent. Grossly normal anterior communicating artery. Anterior cerebral artery is patent proximally, not well evaluated distally due to contrast bolus. M1 segments patent without stenosis or occlusion. No proximal M2 occlusion. Right MCA branches fairly well perfused. There is decreased perfusion of the left MCA branches as compared to the right, which may be related cardiac output. No obvious flow-limiting stenosis or vascular occlusion. Posterior circulation: Dominant left vertebral artery patent to the vertebrobasilar junction without stenosis. Diminutive right vertebral artery not well visualize, and may include prior to  the vertebrobasilar junction. Posterior inferior cerebral arteries not  well evaluated. Basilar artery patent to its distal aspect without stenosis or occlusion. Superior cerebral arteries patent proximally. Posterior cerebral artery is grossly patent proximally, not well evaluated distally. In particular, the right PCA is extremely poorly visualized distally, which may be due to a 80 high-grade stenosis. Scant flow is seen distally within the right PCA territory. Probable fetal type origin of the left PCA. Venous sinuses: Patent. Anatomic variants: Fetal type left PCA. No obvious intracranial aneurysm. Delayed phase: Not performed. Review of the MIP images confirms the above findings CT Brain Perfusion Findings: CBF (<30%) Volume: 50mL Perfusion (Tmax>6.0s) volume: 18mL Mismatch Volume: 03/08/1980mL Infarction Location:Delayed perfusion within knee posterior right MCA/right MCA PCA watershed territory. No frank core infarct. IMPRESSION: 1. Extremely limited study demonstrating no obvious large vessel occlusion. 2. Delayed perfusion within the posterior right MCA territory/right MCA/PCA watershed region. No core infarct. 3. Poor visualization of the distal right PCA, suspected to be related to poor contrast bolus with probable proximal high-grade stenosis. 4. Poor visualization of the distal left MCA branches, also suspected to be related poor cardiac output in contrast bolus and probable fetal type left PCA. No obvious focal proximal stenosis identified. 5. Large left pleural effusion with associated compressive atelectasis of the left lung. Additional moderate layering right pleural effusion. Underlying emphysema. 6. Advanced degenerative spondylolysis and facet arthrosis within cervical spine. Critical Value/emergent results were called by telephone at the time of interpretation on 03/07/2017 at 5:55 am to Dr. Amie Portland , who verbally acknowledged these results. Electronically Signed   By: Jeannine Boga  M.D.   On: 03/07/2017 06:22   Ct Angio Neck W Or Wo Contrast  Result Date: 03/07/2017 CLINICAL DATA:  Initial evaluation for acute unresponsiveness. Left-sided weakness. EXAM: CT ANGIOGRAPHY HEAD AND NECK CT PERFUSION BRAIN TECHNIQUE: Multidetector CT imaging of the head and neck was performed using the standard protocol during bolus administration of intravenous contrast. Multiplanar CT image reconstructions and MIPs were obtained to evaluate the vascular anatomy. Carotid stenosis measurements (when applicable) are obtained utilizing NASCET criteria, using the distal internal carotid diameter as the denominator. Multiphase CT imaging of the brain was performed following IV bolus contrast injection. Subsequent parametric perfusion maps were calculated using RAPID software. CONTRAST:  14mL ISOVUE-370 IOPAMIDOL (ISOVUE-370) INJECTION 76% COMPARISON:  Prior head CT from earlier same day. FINDINGS: CTA NECK FINDINGS Aortic arch: Examination markedly limited due to contrast bolus and poor cardiac output. The visualized aortic arch is normal in caliber with normal branch pattern. Atheromatous plaque about the aortic arch and proximal great vessels without flow-limiting stenosis. Visualized subclavian artery is patent. Right carotid system: Right common carotid artery patent from its origin to the bifurcation without stenosis. No significant atheromatous narrowing about the right bifurcation. Right ICA patent to the skullbase without stenosis, dissection, or occlusion. Left carotid system: Left common carotid artery patent from its origin to the bifurcation without stenosis. No significant atheromatous narrowing about the left bifurcation. Left ICA patent from the bifurcation to the skullbase without flow-limiting stenosis, dissection, or occlusion. Vertebral arteries: Both of the vertebral artery is arise from the subclavian arteries. Left vertebral artery dominant. Left vertebral artery grossly patent within the  neck without obvious stenosis. Right vertebral artery diffusely hypoplastic and not well visualized, but grossly patent as well without occlusion. Skeleton: No obvious acute osseous abnormality, although evaluation limited by positioning. No discrete lytic or blastic osseous lesions. Advanced degenerative spondylolysis and facet arthrosis present within the cervical spine. Mild compression deformity at the superior  endplate of T5 favored to be chronic. Other neck: No acute soft tissue abnormality within the neck. No adenopathy. Thyroid grossly normal. Upper chest: Upper mediastinum demonstrates no acute abnormality. Severe cardiomegaly partially visualized. Large left pleural effusion with compressive atelectasis, with only a small portion of the upper left lung aerated. Moderate layering right pleural effusion. Underlying emphysema. Review of the MIP images confirms the above findings CTA HEAD FINDINGS Anterior circulation: Examination of the intracranial circulation markedly limited by poor contrast bolus and low cardiac output. Petrous segments patent bilaterally. Extensive atheromatous plaque throughout the cavernous ICAs with moderate diffuse narrowing. ICA termini are patent bilaterally. A1 segments patent. Grossly normal anterior communicating artery. Anterior cerebral artery is patent proximally, not well evaluated distally due to contrast bolus. M1 segments patent without stenosis or occlusion. No proximal M2 occlusion. Right MCA branches fairly well perfused. There is decreased perfusion of the left MCA branches as compared to the right, which may be related cardiac output. No obvious flow-limiting stenosis or vascular occlusion. Posterior circulation: Dominant left vertebral artery patent to the vertebrobasilar junction without stenosis. Diminutive right vertebral artery not well visualize, and may include prior to the vertebrobasilar junction. Posterior inferior cerebral arteries not well evaluated.  Basilar artery patent to its distal aspect without stenosis or occlusion. Superior cerebral arteries patent proximally. Posterior cerebral artery is grossly patent proximally, not well evaluated distally. In particular, the right PCA is extremely poorly visualized distally, which may be due to a 80 high-grade stenosis. Scant flow is seen distally within the right PCA territory. Probable fetal type origin of the left PCA. Venous sinuses: Patent. Anatomic variants: Fetal type left PCA. No obvious intracranial aneurysm. Delayed phase: Not performed. Review of the MIP images confirms the above findings CT Brain Perfusion Findings: CBF (<30%) Volume: 26mL Perfusion (Tmax>6.0s) volume: 59mL Mismatch Volume: 03/08/1980mL Infarction Location:Delayed perfusion within knee posterior right MCA/right MCA PCA watershed territory. No frank core infarct. IMPRESSION: 1. Extremely limited study demonstrating no obvious large vessel occlusion. 2. Delayed perfusion within the posterior right MCA territory/right MCA/PCA watershed region. No core infarct. 3. Poor visualization of the distal right PCA, suspected to be related to poor contrast bolus with probable proximal high-grade stenosis. 4. Poor visualization of the distal left MCA branches, also suspected to be related poor cardiac output in contrast bolus and probable fetal type left PCA. No obvious focal proximal stenosis identified. 5. Large left pleural effusion with associated compressive atelectasis of the left lung. Additional moderate layering right pleural effusion. Underlying emphysema. 6. Advanced degenerative spondylolysis and facet arthrosis within cervical spine. Critical Value/emergent results were called by telephone at the time of interpretation on 03/07/2017 at 5:55 am to Dr. Amie Portland , who verbally acknowledged these results. Electronically Signed   By: Jeannine Boga M.D.   On: 03/07/2017 06:22   Ct Cerebral Perfusion W Contrast  Result Date:  03/07/2017 CLINICAL DATA:  Initial evaluation for acute unresponsiveness. Left-sided weakness. EXAM: CT ANGIOGRAPHY HEAD AND NECK CT PERFUSION BRAIN TECHNIQUE: Multidetector CT imaging of the head and neck was performed using the standard protocol during bolus administration of intravenous contrast. Multiplanar CT image reconstructions and MIPs were obtained to evaluate the vascular anatomy. Carotid stenosis measurements (when applicable) are obtained utilizing NASCET criteria, using the distal internal carotid diameter as the denominator. Multiphase CT imaging of the brain was performed following IV bolus contrast injection. Subsequent parametric perfusion maps were calculated using RAPID software. CONTRAST:  58mL ISOVUE-370 IOPAMIDOL (ISOVUE-370) INJECTION 76% COMPARISON:  Prior head  CT from earlier same day. FINDINGS: CTA NECK FINDINGS Aortic arch: Examination markedly limited due to contrast bolus and poor cardiac output. The visualized aortic arch is normal in caliber with normal branch pattern. Atheromatous plaque about the aortic arch and proximal great vessels without flow-limiting stenosis. Visualized subclavian artery is patent. Right carotid system: Right common carotid artery patent from its origin to the bifurcation without stenosis. No significant atheromatous narrowing about the right bifurcation. Right ICA patent to the skullbase without stenosis, dissection, or occlusion. Left carotid system: Left common carotid artery patent from its origin to the bifurcation without stenosis. No significant atheromatous narrowing about the left bifurcation. Left ICA patent from the bifurcation to the skullbase without flow-limiting stenosis, dissection, or occlusion. Vertebral arteries: Both of the vertebral artery is arise from the subclavian arteries. Left vertebral artery dominant. Left vertebral artery grossly patent within the neck without obvious stenosis. Right vertebral artery diffusely hypoplastic and  not well visualized, but grossly patent as well without occlusion. Skeleton: No obvious acute osseous abnormality, although evaluation limited by positioning. No discrete lytic or blastic osseous lesions. Advanced degenerative spondylolysis and facet arthrosis present within the cervical spine. Mild compression deformity at the superior endplate of T5 favored to be chronic. Other neck: No acute soft tissue abnormality within the neck. No adenopathy. Thyroid grossly normal. Upper chest: Upper mediastinum demonstrates no acute abnormality. Severe cardiomegaly partially visualized. Large left pleural effusion with compressive atelectasis, with only a small portion of the upper left lung aerated. Moderate layering right pleural effusion. Underlying emphysema. Review of the MIP images confirms the above findings CTA HEAD FINDINGS Anterior circulation: Examination of the intracranial circulation markedly limited by poor contrast bolus and low cardiac output. Petrous segments patent bilaterally. Extensive atheromatous plaque throughout the cavernous ICAs with moderate diffuse narrowing. ICA termini are patent bilaterally. A1 segments patent. Grossly normal anterior communicating artery. Anterior cerebral artery is patent proximally, not well evaluated distally due to contrast bolus. M1 segments patent without stenosis or occlusion. No proximal M2 occlusion. Right MCA branches fairly well perfused. There is decreased perfusion of the left MCA branches as compared to the right, which may be related cardiac output. No obvious flow-limiting stenosis or vascular occlusion. Posterior circulation: Dominant left vertebral artery patent to the vertebrobasilar junction without stenosis. Diminutive right vertebral artery not well visualize, and may include prior to the vertebrobasilar junction. Posterior inferior cerebral arteries not well evaluated. Basilar artery patent to its distal aspect without stenosis or occlusion. Superior  cerebral arteries patent proximally. Posterior cerebral artery is grossly patent proximally, not well evaluated distally. In particular, the right PCA is extremely poorly visualized distally, which may be due to a 80 high-grade stenosis. Scant flow is seen distally within the right PCA territory. Probable fetal type origin of the left PCA. Venous sinuses: Patent. Anatomic variants: Fetal type left PCA. No obvious intracranial aneurysm. Delayed phase: Not performed. Review of the MIP images confirms the above findings CT Brain Perfusion Findings: CBF (<30%) Volume: 65mL Perfusion (Tmax>6.0s) volume: 9mL Mismatch Volume: 03/08/1980mL Infarction Location:Delayed perfusion within knee posterior right MCA/right MCA PCA watershed territory. No frank core infarct. IMPRESSION: 1. Extremely limited study demonstrating no obvious large vessel occlusion. 2. Delayed perfusion within the posterior right MCA territory/right MCA/PCA watershed region. No core infarct. 3. Poor visualization of the distal right PCA, suspected to be related to poor contrast bolus with probable proximal high-grade stenosis. 4. Poor visualization of the distal left MCA branches, also suspected to be related poor cardiac  output in contrast bolus and probable fetal type left PCA. No obvious focal proximal stenosis identified. 5. Large left pleural effusion with associated compressive atelectasis of the left lung. Additional moderate layering right pleural effusion. Underlying emphysema. 6. Advanced degenerative spondylolysis and facet arthrosis within cervical spine. Critical Value/emergent results were called by telephone at the time of interpretation on 03/07/2017 at 5:55 am to Dr. Amie Portland , who verbally acknowledged these results. Electronically Signed   By: Jeannine Boga M.D.   On: 03/07/2017 06:22   Dg Chest Portable 1 View  Result Date: 03/07/2017 CLINICAL DATA:  Altered mental status. EXAM: PORTABLE CHEST 1 VIEW COMPARISON:   01/12/2017 FINDINGS: Large left pleural effusion occupying the lower 2/3 of left hemithorax, increased from prior exam. Cardiomegaly again seen. Increased interstitial opacities suspicious for pulmonary edema. No confluent airspace disease in the right lung. Patient's chin obscures the lung apices. IMPRESSION: 1. Large left pleural effusion occupying the lower 2/3 of left hemithorax. 2. Cardiomegaly. Interstitial opacities suspicious for pulmonary edema. Electronically Signed   By: Jeb Levering M.D.   On: 03/07/2017 06:25   Ct Head Code Stroke Wo Contrast  Result Date: 03/07/2017 CLINICAL DATA:  Code stroke. Initial evaluation for acute facial weakness. EXAM: CT HEAD WITHOUT CONTRAST TECHNIQUE: Contiguous axial images were obtained from the base of the skull through the vertex without intravenous contrast. COMPARISON:  Prior CT from 11/12/2015. FINDINGS: Brain: Atrophy with chronic microvascular ischemic disease. No acute intracranial hemorrhage. No acute large vessel territory infarct. No mass lesion or midline shift. No hydrocephalus. No extra-axial fluid collection. Vascular: No hyperdense vessel. Scattered vascular calcifications noted within the carotid siphons. Skull: Scalp soft tissues and calvarium within normal limits. Sinuses/Orbits: Globes and orbital soft tissues within normal limits. Mild scattered mucosal thickening within the ethmoidal air cells. Chronic right sphenoid sinusitis. Paranasal sinuses otherwise clear. Left mastoid effusion. Other: None. ASPECTS [facility] Stroke Program Early CT Score) - Ganglionic level infarction (caudate, lentiform nuclei, internal capsule, insula, M1-M3 cortex): 7 - Supraganglionic infarction (M4-M6 cortex): 3 Total score (0-10 with 10 being normal): 10 IMPRESSION: 1. No acute intracranial infarct or other process. 2. ASPECTS is 10. 3. Moderately advanced cerebral atrophy with chronic small vessel ischemic disease. Critical Value/emergent results were called by  telephone at the time of interpretation on 03/07/2017 at 5:46 am to Dr. Rory Percy , who verbally acknowledged these results. Electronically Signed   By: Jeannine Boga M.D.   On: 03/07/2017 05:47    Procedures Procedures (including critical care time)  Medications Ordered in ED Medications  iopamidol (ISOVUE-370) 76 % injection (not administered)  aztreonam (AZACTAM) 2 g in dextrose 5 % 50 mL IVPB (not administered)  vancomycin (VANCOCIN) 1,750 mg in sodium chloride 0.9 % 500 mL IVPB (not administered)  furosemide (LASIX) injection 40 mg (not administered)  naloxone (NARCAN) injection 2 mg (2 mg Intravenous Given 03/07/17 0507)  iopamidol (ISOVUE-370) 76 % injection (90 mLs  Contrast Given 03/07/17 0515)     Initial Impression / Assessment and Plan / ED Course  I have reviewed the triage vital signs and the nursing notes.  Pertinent labs & imaging results that were available during my care of the patient were reviewed by me and considered in my medical decision making (see chart for details).    Patient from home with altered mental status and respiratory distress.  Code stroke on arrival.  Workup shows negative CT head but does show large left-sided pleural effusion  His family has arrived and states  patient has dementia at baseline.  They do not wants any heroic measures to prolong his life.  He is DNR and DNI.  Dr. Rory Percy of neurology at bedside.  Patient is not TPA candidate.  He does have some delayed perfusion of right MCA territory on CTA. No large vessel occlusion. He is not IR candidate given poor functional status per Dr. Rory Percy.   IV antibiotics given for L pleural effusion. Will also trial dose of lasix.   Remains in some respiratory distress on NRB.  Family at bedside reasonable and do not want heroic measures. They understand that patient will likely not survive this hospitalization. They are undecided on thoracentesis but are leaning toward comfort care.  Admission  d/w NP Black.   CRITICAL CARE Performed by: Ezequiel Essex Total critical care time: 45 minutes Critical care time was exclusive of separately billable procedures and treating other patients. Critical care was necessary to treat or prevent imminent or life-threatening deterioration. Critical care was time spent personally by me on the following activities: development of treatment plan with patient and/or surrogate as well as nursing, discussions with consultants, evaluation of patient's response to treatment, examination of patient, obtaining history from patient or surrogate, ordering and performing treatments and interventions, ordering and review of laboratory studies, ordering and review of radiographic studies, pulse oximetry and re-evaluation of patient's condition.  Final Clinical Impressions(s) / ED Diagnoses   Final diagnoses:  Acute ischemic stroke [facility])  Pleural effusion on left    ED Discharge Orders    None       Monifa Blanchette, Annie Main, MD 03/07/17 1001

## 2017-03-08 LAB — HEMOGLOBIN A1C
Hgb A1c MFr Bld: 5.2 % (ref 4.8–5.6)
Mean Plasma Glucose: 103 mg/dL

## 2017-03-12 LAB — CULTURE, BLOOD (ROUTINE X 2)
Culture: NO GROWTH
Culture: NO GROWTH
Special Requests: ADEQUATE
Special Requests: ADEQUATE

## 2017-03-23 NOTE — Telephone Encounter (Unsigned)
Copied from [place] [phone]. Topic: Inquiry >> Mar 23, 2017  1:19 PM Neva Seat wrote: Abigail Butts from [facility] of [place] [phone] Family would like for pt to have a one time consult for physical therapy.  Please call Abigail Butts back to let her know this is approved by pt's doctor - Nafziger.

## 2017-03-24 ENCOUNTER — Encounter: Payer: Self-pay | Admitting: Adult Health

## 2017-03-25 ENCOUNTER — Telehealth: Payer: Self-pay | Admitting: *Deleted

## 2017-03-25 NOTE — Telephone Encounter (Signed)
Spoke to [place] and advised that she proceed with order for PT.

## 2017-03-25 NOTE — Telephone Encounter (Signed)
Copied from [place] [phone]. Topic: General - Other >> Mar 25, 2017  8:51 AM  [place] wrote:  Abigail Butts with Hospice call to ask for a order for a 1 time  PT consult   [phone]

## 2017-03-25 NOTE — Telephone Encounter (Signed)
Ok for verbal order  °

## 2017-04-06 ENCOUNTER — Telehealth: Payer: Self-pay | Admitting: Neurology

## 2017-04-06 NOTE — Telephone Encounter (Signed)
Patient's daughter [name] called to advise patient passed away 04/07/2017.

## 2017-04-22 DEATH — deceased

## 2017-05-05 ENCOUNTER — Ambulatory Visit: Payer: Medicare Other | Admitting: Internal Medicine

## 2017-05-30 ENCOUNTER — Telehealth: Payer: Self-pay | Admitting: Adult Health

## 2017-05-30 NOTE — Telephone Encounter (Signed)
Copied from [place]. Topic: Quick Communication - See Telephone Encounter >> May 30, 2017  2:29 PM Neva Seat wrote: [place] [phone]  Orders: Medication order for Murelax and a suppository, update on skilled nursing pt was receiving at the time. Please

## 2017-05-31 NOTE — Telephone Encounter (Signed)
Spoke to [employer].  She needed order signed on 03/31/17 (see media) for Miralax and suppository.  Faxed to [phone].  No further action needed

## 2017-05-31 NOTE — Telephone Encounter (Signed)
Left a message for a return call.

## 2017-07-14 IMAGING — CT CT ANGIO CHEST
2 of 6 series · 18 of 36 positions shown · IV contrast (Omni 300)
Comparison: 10/28/2014

CLINICAL DATA: Shortness of breath and hemoptysis.

EXAM:
CT ANGIOGRAPHY CHEST WITH CONTRAST
TECHNIQUE: Multidetector CT imaging of the chest was performed using the
standard protocol during bolus administration of intravenous
contrast. Multiplanar CT image reconstructions and MIPs were
obtained to evaluate the vascular anatomy.
CONTRAST:  100mL OMNIPAQUE IOHEXOL 350 MG/ML SOLN

[Series 6: pe thins · axial · 0.76mm/px · z∈[+1319,+1604]mm · 17 of 633 slices shown]
[im 31/633  lung]
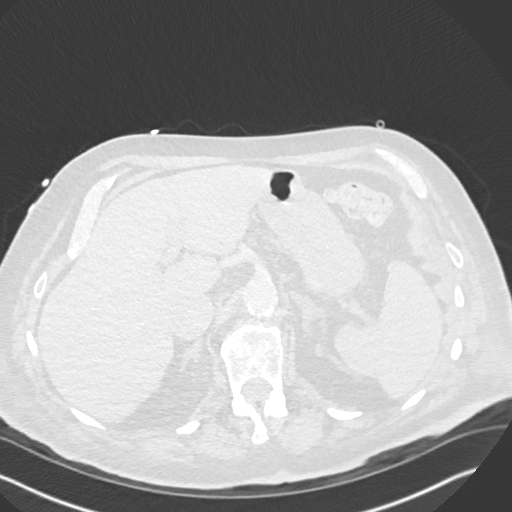
[im 61/633  mediastinal]
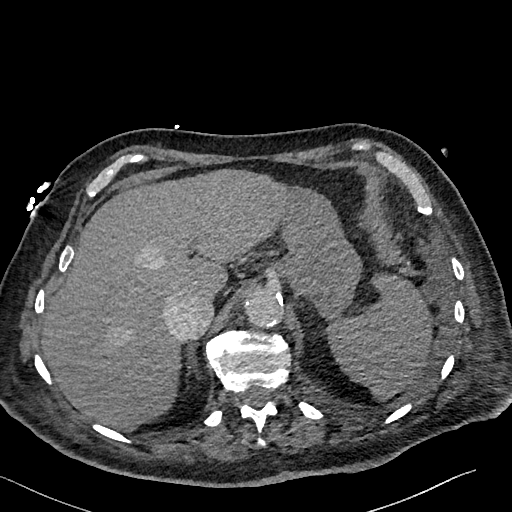
[im 91/633  lung]
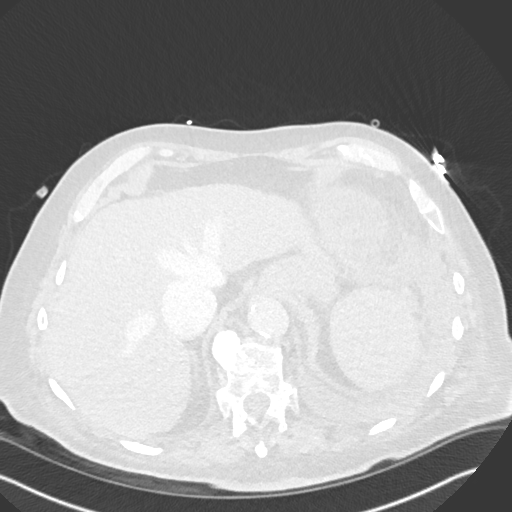
[im 151/633  mediastinal]
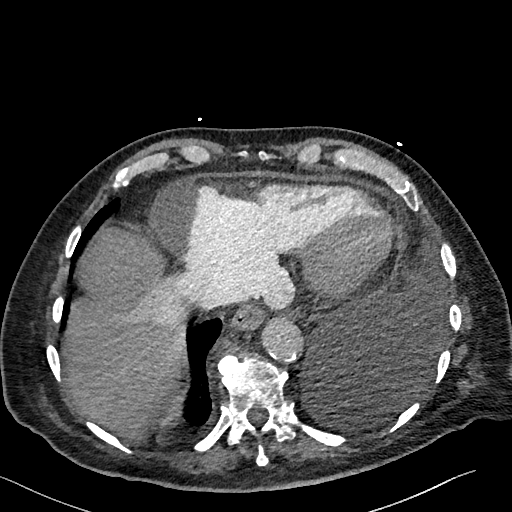
[im 181/633  lung]
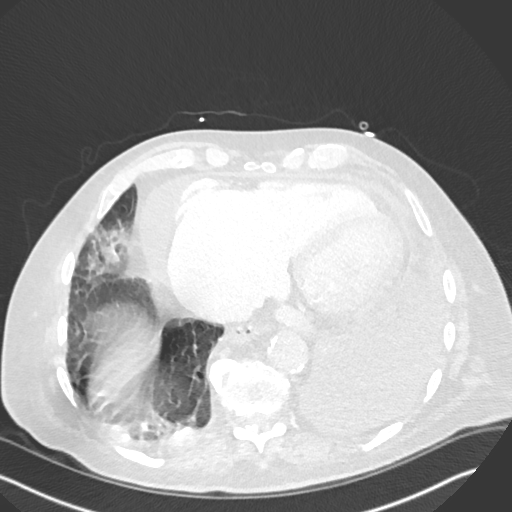
[im 211/633  mediastinal]
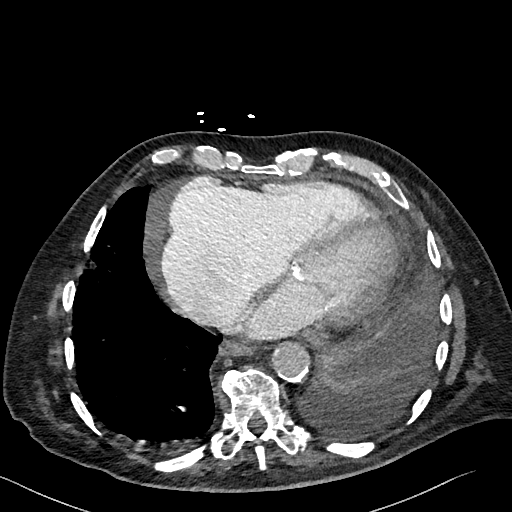
[im 241/633  lung]
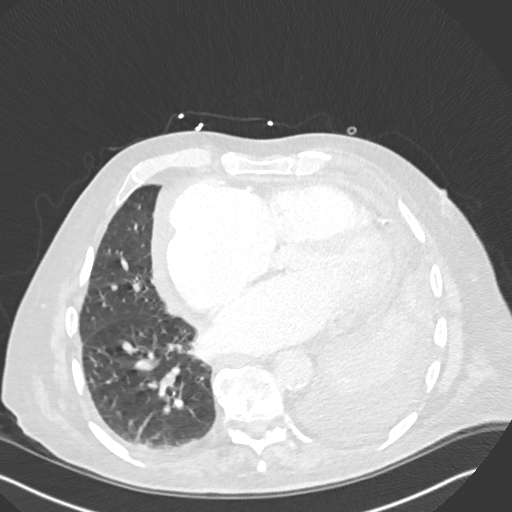
[im 271/633  mediastinal]
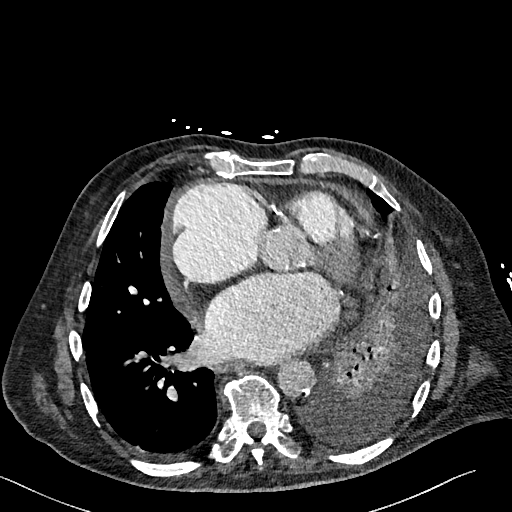
[im 332/633  lung]
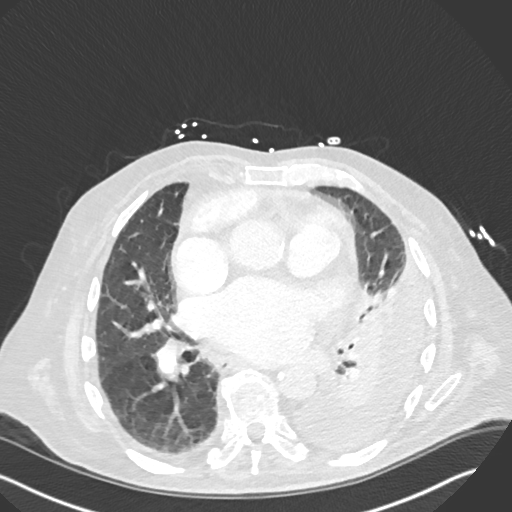
[im 362/633  mediastinal]
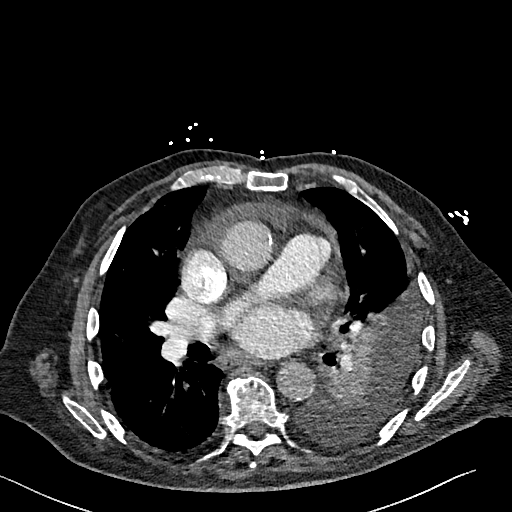
[im 392/633  lung]
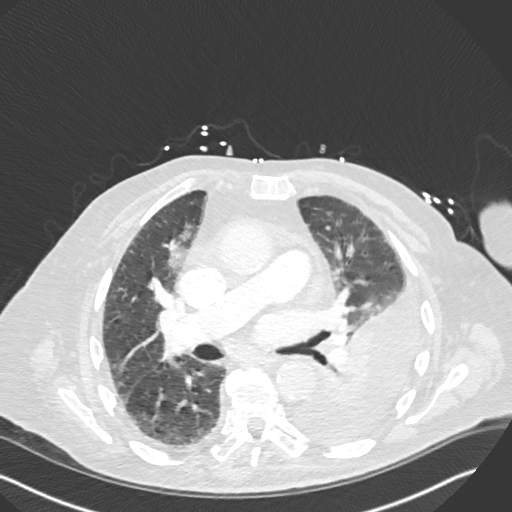
[im 422/633  mediastinal]
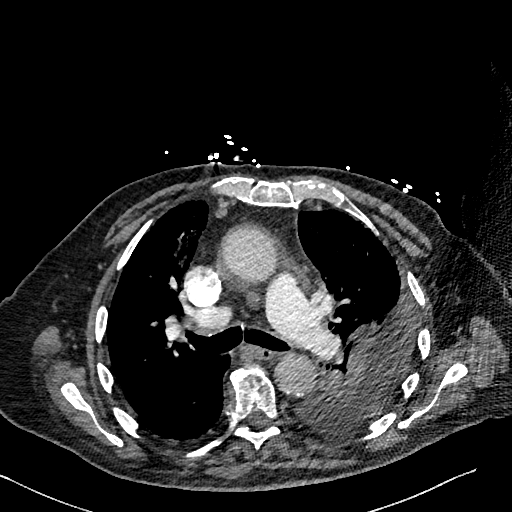
[im 452/633  lung]
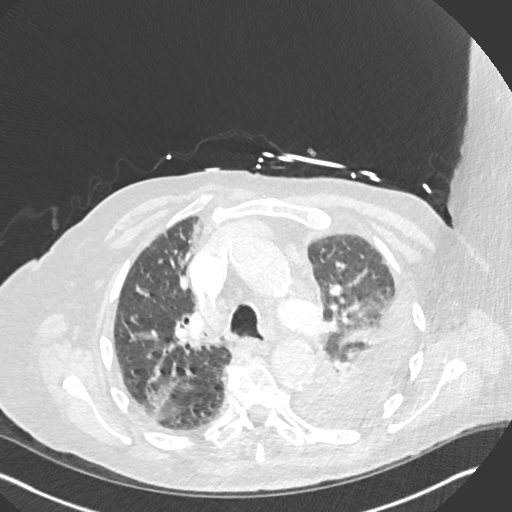
[im 482/633  mediastinal]
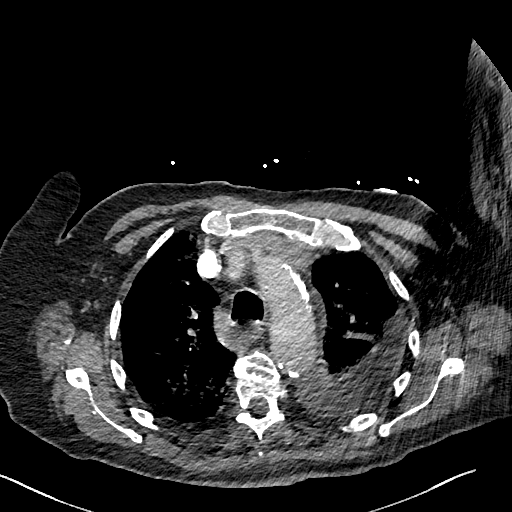
[im 542/633  lung]
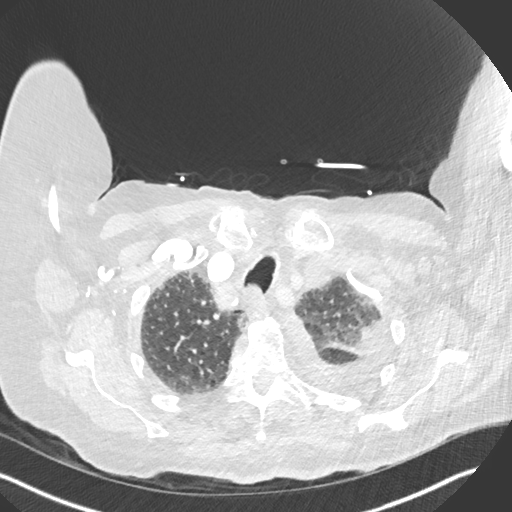
[im 572/633  mediastinal]
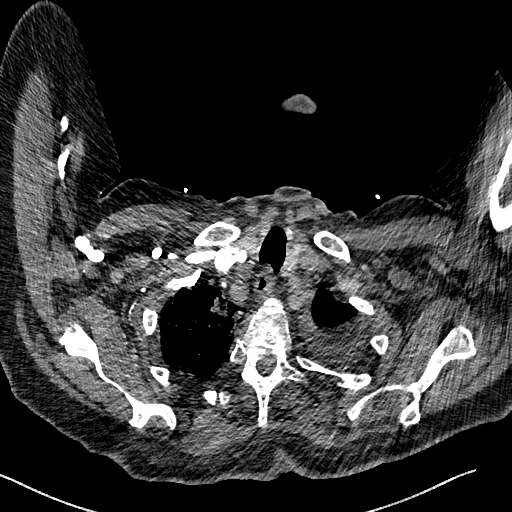
[im 602/633  lung]
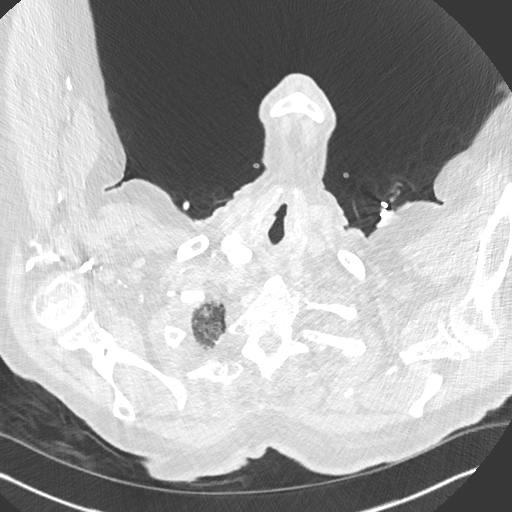

[Series 7: pe 2mm cor · coronal · 0.62mm/px · 1 of 135 slices shown]
[im 68/135  mediastinal]
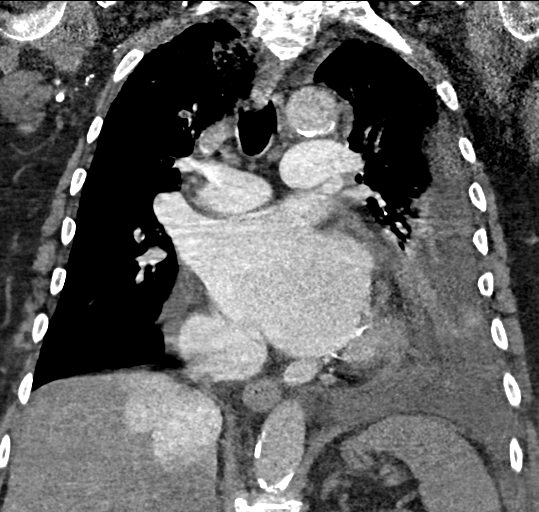

[18 of 36 positions shown; findings below may reference images not displayed]

FINDINGS: THORACIC INLET/BODY WALL:

No acute abnormality.

MEDIASTINUM:

Cardiomegaly with marked biventricular enlargement. There is a small
to moderate pericardial effusion which is near water density.
Patient is status post pericardiocentesis, accounting for gas in the
subxiphoid region. Detection of pericardial serosal enhancement is
limited by contrast timing. Diffuse atherosclerosis, including the
coronary arteries. No aortic dissection. No convincing acute
pulmonary embolism with bolus dispersion and frequent motion
limiting sensitivity. Right heart failure with intrahepatic vein
reflux and distension.

LUNG WINDOWS:

Moderate layering left pleural effusion with near complete left
lower lobe collapse. Patchy airspace opacity in the right lung with
nonspecific pattern. No interlobular septal thickening. No primary
source of hemoptysis is visualized.

UPPER ABDOMEN:

No acute findings.

OSSEOUS:

No acute fracture.  No suspicious lytic or blastic lesions.

Review of the MIP images confirms the above findings.
IMPRESSION: 1. Motion limited study.  No convincing acute pulmonary embolism.
2. Small to moderate pericardial effusion. No CT before the recent
pericardiocentesis for comparison.
3. Biatrial enlargement with changes of right heart failure.
4. Moderate layering left pleural effusion with near complete left
lower lobe collapse.
5. Multi focal airspace disease in the right lung which could
reflect pneumonia or patchy alveolar hemorrhage (given history of
hemoptysis).

## 2017-07-19 ENCOUNTER — Ambulatory Visit: Payer: Medicare Other | Admitting: Nurse Practitioner

## 2018-09-20 DEATH — deceased
